# Patient Record
Sex: Male | Born: 1950 | Race: White | Hispanic: No | Marital: Married | Smoking: Former smoker
Health system: Southern US, Community
[De-identification: ages and names within clinical notes are randomized; demographics above are authoritative.]

## PROBLEM LIST (undated history)

## (undated) DIAGNOSIS — D709 Neutropenia, unspecified: Secondary | ICD-10-CM

## (undated) DIAGNOSIS — N183 Chronic kidney disease, stage 3 (moderate): Secondary | ICD-10-CM

## (undated) DIAGNOSIS — I480 Paroxysmal atrial fibrillation: Secondary | ICD-10-CM

## (undated) DIAGNOSIS — C9000 Multiple myeloma not having achieved remission: Secondary | ICD-10-CM

## (undated) DIAGNOSIS — I10 Essential (primary) hypertension: Secondary | ICD-10-CM

## (undated) DIAGNOSIS — G64 Other disorders of peripheral nervous system: Secondary | ICD-10-CM

## (undated) DIAGNOSIS — I5021 Acute systolic (congestive) heart failure: Secondary | ICD-10-CM

## (undated) DIAGNOSIS — I248 Other forms of acute ischemic heart disease: Secondary | ICD-10-CM

## (undated) DIAGNOSIS — B3781 Candidal esophagitis: Secondary | ICD-10-CM

## (undated) DIAGNOSIS — K529 Noninfective gastroenteritis and colitis, unspecified: Secondary | ICD-10-CM

## (undated) DIAGNOSIS — B449 Aspergillosis, unspecified: Secondary | ICD-10-CM

## (undated) DIAGNOSIS — B370 Candidal stomatitis: Secondary | ICD-10-CM

## (undated) DIAGNOSIS — E43 Unspecified severe protein-calorie malnutrition: Secondary | ICD-10-CM

## (undated) DIAGNOSIS — R7881 Bacteremia: Secondary | ICD-10-CM

## (undated) DIAGNOSIS — R5081 Fever presenting with conditions classified elsewhere: Secondary | ICD-10-CM

## (undated) HISTORY — PX: CARDIAC CATHETERIZATION: SHX172

## (undated) HISTORY — PX: PORTACATH PLACEMENT: SHX2246

---

## 2012-07-23 DIAGNOSIS — B4481 Allergic bronchopulmonary aspergillosis: Secondary | ICD-10-CM | POA: Insufficient documentation

## 2012-07-23 DIAGNOSIS — I10 Essential (primary) hypertension: Secondary | ICD-10-CM | POA: Insufficient documentation

## 2012-07-23 DIAGNOSIS — I509 Heart failure, unspecified: Secondary | ICD-10-CM | POA: Insufficient documentation

## 2012-07-23 DIAGNOSIS — G64 Other disorders of peripheral nervous system: Secondary | ICD-10-CM | POA: Insufficient documentation

## 2012-07-23 DIAGNOSIS — C9000 Multiple myeloma not having achieved remission: Secondary | ICD-10-CM

## 2012-07-23 HISTORY — DX: Other disorders of peripheral nervous system: G64

## 2012-07-23 HISTORY — DX: Multiple myeloma not having achieved remission: C90.00

## 2012-08-01 ENCOUNTER — Telehealth: Payer: Self-pay | Admitting: Oncology

## 2012-08-01 NOTE — Telephone Encounter (Signed)
C/D 08/01/12 for appt. 08/07/12

## 2012-08-07 ENCOUNTER — Ambulatory Visit (HOSPITAL_BASED_OUTPATIENT_CLINIC_OR_DEPARTMENT_OTHER): Payer: BC Managed Care – PPO | Admitting: Oncology

## 2012-08-07 ENCOUNTER — Ambulatory Visit (HOSPITAL_BASED_OUTPATIENT_CLINIC_OR_DEPARTMENT_OTHER): Payer: BC Managed Care – PPO

## 2012-08-07 ENCOUNTER — Encounter: Payer: Self-pay | Admitting: Oncology

## 2012-08-07 ENCOUNTER — Telehealth: Payer: Self-pay | Admitting: Oncology

## 2012-08-07 VITALS — BP 122/74 | HR 71 | Temp 97.8°F | Resp 18 | Ht 72.0 in | Wt 211.0 lb

## 2012-08-07 DIAGNOSIS — C9000 Multiple myeloma not having achieved remission: Secondary | ICD-10-CM

## 2012-08-07 NOTE — Progress Notes (Signed)
Checked in new patient. No financial issues. °

## 2012-08-07 NOTE — Telephone Encounter (Signed)
Pt is aware that I will call him with the appt for June. Sent michelle a staff message to add the chemo appts. Pt is aware to pick up a schedule at his first June appt

## 2012-08-08 ENCOUNTER — Telehealth: Payer: Self-pay | Admitting: *Deleted

## 2012-08-08 MED ORDER — ASPIRIN 325 MG PO TABS: 325.0000 mg | ORAL_TABLET | Freq: Every day | ORAL | Status: DC

## 2012-08-08 NOTE — Progress Notes (Signed)
[facility] New Patient Consult   Referring ZO:[id number] Walter Dennis 62 y.o.  December 19, 1950    Reason for Referral: Multiple myeloma     HPI: He reports originally been diagnosed with multiple myeloma while living in [place]. He was treated with lenalidomide and Decadron and developed neuropathy. Treatment was switched to IV Velcade and Decadron followed by an autologous stem cell transplant in 2009 at [facility]. He remained in remission until 2012 when there was a rise in the light chains and serum M spike. He was started on treatment with the VCD. 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 He was living in [place] at the time. He remained off of therapy until January 2014 when the serum M spike Rose. He was started on treatment with Carfilzomib, low-dose Revlimid, and dose reduced Decadron. This continued until April of 2014. He has been maintained off of therapy for the past month while relocating to [place].  He saw Dr. Leeanne Deed at [facility] on 07/25/2012. Dr. Leeanne Deed recommends continuing the current systemic therapy regimen. Walter Dennis has relocated to [facility]. He currently feels well. He has chronic low leg/foot pain secondary to peripheral neuropathy. The neuropathy is felt to be secondary to Velcade therapy.  Past medical history: 1. Multiple myeloma diagnosed in 2008  2. 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 Hypertension  4. Irritable bowel syndrome  5. 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 A maternal aunt died of lung cancer. A maternal aunt died of "brain" cancer.  Current outpatient prescriptions:acyclovir (ZOVIRAX) 400 MG tablet, Take 400 mg by mouth every 12 (twelve) hours. Take 1 tablet by mouth every 12 hours to prevent shingles while on chemo, Disp: ,  Rfl: ;  carvedilol (COREG) 6.25 MG tablet, Take 6.25 mg by mouth every morning., Disp: , Rfl: ;  esomeprazole (NEXIUM) 40 MG capsule, Take 40 mg by mouth daily before breakfast., Disp: , Rfl:  fentaNYL (DURAGESIC - DOSED MCG/HR) 50 MCG/HR, Place 1 patch onto the skin every 3 (three) days., Disp: , Rfl: ;  furosemide (LASIX) 20 MG tablet, Take 40 mg by mouth daily., Disp: , Rfl: ;  HYDROcodone-acetaminophen (NORCO) 10-325 MG per tablet, Take 1 tablet by mouth every 6 (six) hours as needed for pain., Disp: , Rfl: ;  potassium chloride (K-DUR) 10 MEQ tablet, Take 10 mEq by mouth 2 (two) times daily., Disp: , Rfl:  pregabalin (LYRICA) 50 MG capsule, Take 50 mg by mouth 2 (two) times daily., Disp: , Rfl: ;  valsartan (DIOVAN) 80 MG tablet, Take 80 mg by mouth daily., Disp: , Rfl:   Allergies: Not on File  Social History: He works in Manufacturing engineer. He recently relocated to [place]. He quit smoking cigarettes in 2008. He drinks alcohol on social occasions. No risk factor for HIV or hepatitis. No transfusion history.   ROS:   Positives include: Night sweats one to 2 times per week, loose stools, pain/numbness in the lower legs and feet for the past 3 years, left greater than right  A complete ROS was otherwise negative.  Physical Exam:  Blood pressure 122/74, pulse 71, temperature 97.8 F (36.6 C), temperature source Oral, resp. rate 18, height 6' (1.829 m), weight 211 lb (95.709 kg).  HEENT: Oropharynx without visible mass, neck without mass, no thrush Lungs: Clear bilaterally Cardiac: Regular rate and rhythm  Abdomen: No hepatosplenomegaly  Vascular: No leg edema Lymph nodes: No cervical, supraclavicular, axillary, or inguinal nodes Neurologic: Alert and oriented, the motor exam appears intact in the upper and lower extremities Skin: No rash Musculoskeletal: No spine tenderness Right upper chest Port-A-Cath site without erythema  LAB: 07/24/2012 at [facility]-hemoglobin 13.7, platelets  218,000, white count 6.5, ANC 4.0, serum M spike 0.58, beta-2 microglobulin 2.3, free kappa light chain 0.21, free lambda light chain 18.1, IgA 132, IgM 19, IgG 1570, monoclonal IgG lambda detected    Radiology:  Metastatic bone survey 07/26/2012 at [facility]-no definite lytic lesions in the skeleton, patchy sclerosis over the right femoral head,? Avascular necrosis  Assessment/Plan:   1. Multiple myeloma, IgG lambda monoclonal protein -Initial diagnosis 2008, bone marrow with a 40-50% plasma cells -Lenalidomide 25 mg,day 1-21 and Decadron 40 mg weekly and 2009, complicated by peripheral neuropathy -Therapy change to bortezomib IV twice weekly plus Decadron -High-dose chemotherapy with autologous stem cell transplantation 2009 at [facility] -Bortezomib, cyclophosphamide, and Decadron December 2012 through February 2014 (discontinued secondary to pulmonary aspergillosis -January 2014 M spike rise to 1.1 g/dL from 0.1 g/dL in December 7846. Treatment started with Carlfilzomib, lenalidomide, and Decadron.  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 Irritable bowel syndrome  5. Hypertension   Disposition:   Walter Dennis has multiple myeloma. He saw Dr. Leeanne Deed on 07/25/2012. The plan is to continue the current systemic regimen. He has relocated to [place]. The plan is to resume Carfilzomib, lenalidomide, and Decadron therapy on 08/16/2012. We will obtain a baseline serum M spike and serum free light chain level when he returns next week.  He has been referred to the infectious disease service to discuss the indication for prophylactic therapy to prevent recurrent aspergillosis and whether he can receive full dose steroids in the future. He will begin acyclovir and aspirin prophylaxis.  I will see him after the first cycle of treatment. I will request records from his oncologist in [place].   Kirkland Figg 08/08/2012, 7:08 AM

## 2012-08-08 NOTE — Telephone Encounter (Signed)
Per staff message and POF I have scheduled appts.  JMW  

## 2012-08-08 NOTE — Telephone Encounter (Signed)
Per Dr. Truett Perna, called and instructed pt to start ASA 325 mg daily.  Pt verbalized understanding and stated he called the Cancer [facility] in [place] and they will fax records.

## 2012-08-15 ENCOUNTER — Ambulatory Visit: Payer: BC Managed Care – PPO

## 2012-08-15 ENCOUNTER — Telehealth: Payer: Self-pay | Admitting: *Deleted

## 2012-08-15 ENCOUNTER — Other Ambulatory Visit: Payer: Self-pay | Admitting: Oncology

## 2012-08-15 ENCOUNTER — Other Ambulatory Visit: Payer: Self-pay | Admitting: *Deleted

## 2012-08-15 DIAGNOSIS — C9000 Multiple myeloma not having achieved remission: Secondary | ICD-10-CM

## 2012-08-15 HISTORY — DX: Multiple myeloma not having achieved remission: C90.00

## 2012-08-15 MED ORDER — LENALIDOMIDE 10 MG PO CAPS: 10.0000 mg | ORAL_CAPSULE | Freq: Every day | ORAL | Status: DC

## 2012-08-15 MED ORDER — DEXAMETHASONE 4 MG PO TABS: 20.0000 mg | ORAL_TABLET | ORAL | Status: DC

## 2012-08-15 NOTE — Telephone Encounter (Signed)
Called pt, reviewed Revlimid instructions. He voiced understanding. Decadron instructions reviewed as well. Pt uses [facility] mail order pharmacy for Revlimid.

## 2012-08-16 ENCOUNTER — Other Ambulatory Visit: Payer: Self-pay | Admitting: Oncology

## 2012-08-16 ENCOUNTER — Other Ambulatory Visit (HOSPITAL_BASED_OUTPATIENT_CLINIC_OR_DEPARTMENT_OTHER): Payer: BC Managed Care – PPO | Admitting: Lab

## 2012-08-16 ENCOUNTER — Ambulatory Visit (HOSPITAL_BASED_OUTPATIENT_CLINIC_OR_DEPARTMENT_OTHER): Payer: BC Managed Care – PPO

## 2012-08-16 ENCOUNTER — Telehealth: Payer: Self-pay | Admitting: *Deleted

## 2012-08-16 ENCOUNTER — Telehealth: Payer: Self-pay | Admitting: Oncology

## 2012-08-16 ENCOUNTER — Encounter: Payer: Self-pay | Admitting: Oncology

## 2012-08-16 VITALS — BP 111/62 | HR 68 | Temp 98.4°F | Resp 20

## 2012-08-16 DIAGNOSIS — Z5112 Encounter for antineoplastic immunotherapy: Secondary | ICD-10-CM

## 2012-08-16 DIAGNOSIS — C9002 Multiple myeloma in relapse: Secondary | ICD-10-CM

## 2012-08-16 DIAGNOSIS — C9000 Multiple myeloma not having achieved remission: Secondary | ICD-10-CM

## 2012-08-16 LAB — CBC WITH DIFFERENTIAL/PLATELET
BASO%: 1.2 % (ref 0.0–2.0)
Basophils Absolute: 0.1 10*3/uL (ref 0.0–0.1)
HCT: 36.4 % — ABNORMAL LOW (ref 38.4–49.9)
HGB: 12.6 g/dL — ABNORMAL LOW (ref 13.0–17.1)
MONO#: 0.5 10*3/uL (ref 0.1–0.9)
NEUT%: 58.2 % (ref 39.0–75.0)
RDW: 14.4 % (ref 11.0–14.6)
WBC: 6.2 10*3/uL (ref 4.0–10.3)
lymph#: 1.7 10*3/uL (ref 0.9–3.3)

## 2012-08-16 LAB — COMPREHENSIVE METABOLIC PANEL
ALT: 17 U/L (ref 0–53)
Albumin: 3.7 g/dL (ref 3.5–5.2)
BUN: 18 mg/dL (ref 6–23)
CO2: 28 mEq/L (ref 19–32)
Calcium: 9.3 mg/dL (ref 8.4–10.5)
Chloride: 99 mEq/L (ref 96–112)
Creatinine, Ser: 1.3 mg/dL (ref 0.50–1.35)
Potassium: 4 mEq/L (ref 3.5–5.3)

## 2012-08-16 MED ORDER — SODIUM CHLORIDE 0.9 % IJ SOLN
10.0000 mL | INTRAMUSCULAR | Status: DC | PRN
  Administered 2012-08-16: 10 mL
  Filled 2012-08-16: qty 10

## 2012-08-16 MED ORDER — SODIUM CHLORIDE 0.9 % IV SOLN
Freq: Once | INTRAVENOUS | Status: AC
  Administered 2012-08-16: 17:00:00 via INTRAVENOUS

## 2012-08-16 MED ORDER — HEPARIN SOD (PORK) LOCK FLUSH 100 UNIT/ML IV SOLN
500.0000 [IU] | Freq: Once | INTRAVENOUS | Status: AC | PRN
  Administered 2012-08-16: 500 [IU]
  Filled 2012-08-16: qty 5

## 2012-08-16 MED ORDER — ONDANSETRON 8 MG/50ML IVPB (CHCC)
8.0000 mg | Freq: Once | INTRAVENOUS | Status: AC
  Administered 2012-08-16: 8 mg via INTRAVENOUS

## 2012-08-16 MED ORDER — SODIUM CHLORIDE 0.9 % IV SOLN
Freq: Once | INTRAVENOUS | Status: AC
  Administered 2012-08-16: 16:00:00 via INTRAVENOUS

## 2012-08-16 MED ORDER — DEXTROSE 5 % IV SOLN
27.0000 mg/m2 | Freq: Once | INTRAVENOUS | Status: AC
  Administered 2012-08-16: 60 mg via INTRAVENOUS
  Filled 2012-08-16: qty 30

## 2012-08-16 NOTE — Telephone Encounter (Signed)
TAlked to pt and gave him appt for chemo on 08/17/12

## 2012-08-16 NOTE — Progress Notes (Signed)
Faxed revlimid prescription to [facility] @ [phone].

## 2012-08-16 NOTE — Patient Instructions (Addendum)
[facility] Discharge Instructions for Patients Receiving Chemotherapy  Today you received the following chemotherapy agents Kyprolis.  To help prevent nausea and vomiting after your treatment, we encourage you to take your nausea medication.   If you develop nausea and vomiting that is not controlled by your nausea medication, call the clinic.   BELOW ARE SYMPTOMS THAT SHOULD BE REPORTED IMMEDIATELY:  *FEVER GREATER THAN 100.5 F  *CHILLS WITH OR WITHOUT FEVER  NAUSEA AND VOMITING THAT IS NOT CONTROLLED WITH YOUR NAUSEA MEDICATION  *UNUSUAL SHORTNESS OF BREATH  *UNUSUAL BRUISING OR BLEEDING  TENDERNESS IN MOUTH AND THROAT WITH OR WITHOUT PRESENCE OF ULCERS  *URINARY PROBLEMS  *BOWEL PROBLEMS  UNUSUAL RASH Items with * indicate a potential emergency and should be followed up as soon as possible.  Feel free to call the clinic you have any questions or concerns. The clinic phone number is [phone].    

## 2012-08-17 ENCOUNTER — Ambulatory Visit (HOSPITAL_BASED_OUTPATIENT_CLINIC_OR_DEPARTMENT_OTHER): Payer: BC Managed Care – PPO

## 2012-08-17 VITALS — BP 140/76 | HR 109 | Temp 97.1°F

## 2012-08-17 DIAGNOSIS — C9000 Multiple myeloma not having achieved remission: Secondary | ICD-10-CM

## 2012-08-17 DIAGNOSIS — Z5112 Encounter for antineoplastic immunotherapy: Secondary | ICD-10-CM

## 2012-08-17 DIAGNOSIS — C9002 Multiple myeloma in relapse: Secondary | ICD-10-CM

## 2012-08-17 MED ORDER — SODIUM CHLORIDE 0.9 % IJ SOLN
10.0000 mL | INTRAMUSCULAR | Status: DC | PRN
  Administered 2012-08-17: 10 mL
  Filled 2012-08-17: qty 10

## 2012-08-17 MED ORDER — SODIUM CHLORIDE 0.9 % IV SOLN
Freq: Once | INTRAVENOUS | Status: AC
  Administered 2012-08-17: 16:00:00 via INTRAVENOUS

## 2012-08-17 MED ORDER — DEXTROSE 5 % IV SOLN
27.0000 mg/m2 | Freq: Once | INTRAVENOUS | Status: AC
  Administered 2012-08-17: 60 mg via INTRAVENOUS
  Filled 2012-08-17: qty 30

## 2012-08-17 MED ORDER — HEPARIN SOD (PORK) LOCK FLUSH 100 UNIT/ML IV SOLN
500.0000 [IU] | Freq: Once | INTRAVENOUS | Status: AC | PRN
  Administered 2012-08-17: 500 [IU]
  Filled 2012-08-17: qty 5

## 2012-08-17 MED ORDER — ONDANSETRON 8 MG/50ML IVPB (CHCC)
8.0000 mg | Freq: Once | INTRAVENOUS | Status: AC
  Administered 2012-08-17: 8 mg via INTRAVENOUS

## 2012-08-17 NOTE — Patient Instructions (Signed)
[facility] Discharge Instructions for Patients Receiving Chemotherapy  Today you received the following chemotherapy agents kyprolis  To help prevent nausea and vomiting after your treatment, we encourage you to take your nausea medication as needed   If you develop nausea and vomiting that is not controlled by your nausea medication, call the clinic.   BELOW ARE SYMPTOMS THAT SHOULD BE REPORTED IMMEDIATELY:  *FEVER GREATER THAN 100.5 F  *CHILLS WITH OR WITHOUT FEVER  NAUSEA AND VOMITING THAT IS NOT CONTROLLED WITH YOUR NAUSEA MEDICATION  *UNUSUAL SHORTNESS OF BREATH  *UNUSUAL BRUISING OR BLEEDING  TENDERNESS IN MOUTH AND THROAT WITH OR WITHOUT PRESENCE OF ULCERS  *URINARY PROBLEMS  *BOWEL PROBLEMS  UNUSUAL RASH Items with * indicate a potential emergency and should be followed up as soon as possible.  Feel free to call the clinic you have any questions or concerns. The clinic phone number is [phone].    

## 2012-08-20 LAB — PROTEIN ELECTROPHORESIS, SERUM
Albumin ELP: 54.6 % — ABNORMAL LOW (ref 55.8–66.1)
Alpha-1-Globulin: 3.7 % (ref 2.9–4.9)
Alpha-2-Globulin: 7.9 % (ref 7.1–11.8)
Beta 2: 25 % — ABNORMAL HIGH (ref 3.2–6.5)
Beta Globulin: 5.8 % (ref 4.7–7.2)

## 2012-08-20 LAB — IGG, IGA, IGM: IgM, Serum: 17 mg/dL — ABNORMAL LOW (ref 41–251)

## 2012-08-23 ENCOUNTER — Other Ambulatory Visit: Payer: Self-pay | Admitting: *Deleted

## 2012-08-23 ENCOUNTER — Other Ambulatory Visit (HOSPITAL_BASED_OUTPATIENT_CLINIC_OR_DEPARTMENT_OTHER): Payer: BC Managed Care – PPO

## 2012-08-23 ENCOUNTER — Ambulatory Visit (HOSPITAL_BASED_OUTPATIENT_CLINIC_OR_DEPARTMENT_OTHER): Payer: BC Managed Care – PPO

## 2012-08-23 VITALS — BP 137/70 | HR 94 | Temp 98.1°F

## 2012-08-23 DIAGNOSIS — C9002 Multiple myeloma in relapse: Secondary | ICD-10-CM

## 2012-08-23 DIAGNOSIS — Z5112 Encounter for antineoplastic immunotherapy: Secondary | ICD-10-CM

## 2012-08-23 DIAGNOSIS — C9000 Multiple myeloma not having achieved remission: Secondary | ICD-10-CM

## 2012-08-23 LAB — CBC WITH DIFFERENTIAL/PLATELET
Basophils Absolute: 0 10*3/uL (ref 0.0–0.1)
EOS%: 0.1 % (ref 0.0–7.0)
Eosinophils Absolute: 0 10*3/uL (ref 0.0–0.5)
HGB: 12.8 g/dL — ABNORMAL LOW (ref 13.0–17.1)
MONO#: 0.1 10*3/uL (ref 0.1–0.9)
NEUT#: 8.8 10*3/uL — ABNORMAL HIGH (ref 1.5–6.5)
RDW: 14.7 % — ABNORMAL HIGH (ref 11.0–14.6)
WBC: 9.7 10*3/uL (ref 4.0–10.3)
lymph#: 0.8 10*3/uL — ABNORMAL LOW (ref 0.9–3.3)

## 2012-08-23 MED ORDER — ONDANSETRON 8 MG/50ML IVPB (CHCC)
8.0000 mg | Freq: Once | INTRAVENOUS | Status: AC
  Administered 2012-08-23: 8 mg via INTRAVENOUS

## 2012-08-23 MED ORDER — SODIUM CHLORIDE 0.9 % IV SOLN
Freq: Once | INTRAVENOUS | Status: AC
  Administered 2012-08-23: 16:00:00 via INTRAVENOUS

## 2012-08-23 MED ORDER — HEPARIN SOD (PORK) LOCK FLUSH 100 UNIT/ML IV SOLN
500.0000 [IU] | Freq: Once | INTRAVENOUS | Status: AC | PRN
  Filled 2012-08-23: qty 5

## 2012-08-23 MED ORDER — FENTANYL 50 MCG/HR TD PT72: 1.0000 | MEDICATED_PATCH | TRANSDERMAL | Status: DC

## 2012-08-23 MED ORDER — DEXTROSE 5 % IV SOLN
27.0000 mg/m2 | Freq: Once | INTRAVENOUS | Status: AC
  Administered 2012-08-23: 60 mg via INTRAVENOUS
  Filled 2012-08-23: qty 30

## 2012-08-23 MED ORDER — VALSARTAN 80 MG PO TABS: 80.0000 mg | ORAL_TABLET | Freq: Every day | ORAL | Status: DC

## 2012-08-23 MED ORDER — SODIUM CHLORIDE 0.9 % IJ SOLN
10.0000 mL | INTRAMUSCULAR | Status: DC | PRN
  Filled 2012-08-23: qty 10

## 2012-08-23 NOTE — Patient Instructions (Addendum)
[facility] Discharge Instructions for Patients Receiving Chemotherapy  Today you received the following chemotherapy agents Kyprolis,  To help prevent nausea and vomiting after your treatment, we encourage you to take your nausea medication as prescribed.   If you develop nausea and vomiting that is not controlled by your nausea medication, call the clinic.   BELOW ARE SYMPTOMS THAT SHOULD BE REPORTED IMMEDIATELY:  *FEVER GREATER THAN 100.5 F  *CHILLS WITH OR WITHOUT FEVER  NAUSEA AND VOMITING THAT IS NOT CONTROLLED WITH YOUR NAUSEA MEDICATION  *UNUSUAL SHORTNESS OF BREATH  *UNUSUAL BRUISING OR BLEEDING  TENDERNESS IN MOUTH AND THROAT WITH OR WITHOUT PRESENCE OF ULCERS  *URINARY PROBLEMS  *BOWEL PROBLEMS  UNUSUAL RASH Items with * indicate a potential emergency and should be followed up as soon as possible.  Feel free to call the clinic you have any questions or concerns. The clinic phone number is [phone].

## 2012-08-23 NOTE — Telephone Encounter (Signed)
Patient requesting refill on Duragesic patches. Has #2 left-uses one patch every 3 days.

## 2012-08-24 ENCOUNTER — Ambulatory Visit (HOSPITAL_BASED_OUTPATIENT_CLINIC_OR_DEPARTMENT_OTHER): Payer: BC Managed Care – PPO

## 2012-08-24 VITALS — BP 115/67 | HR 62 | Temp 98.3°F

## 2012-08-24 DIAGNOSIS — C9002 Multiple myeloma in relapse: Secondary | ICD-10-CM

## 2012-08-24 DIAGNOSIS — Z5112 Encounter for antineoplastic immunotherapy: Secondary | ICD-10-CM

## 2012-08-24 DIAGNOSIS — C9000 Multiple myeloma not having achieved remission: Secondary | ICD-10-CM

## 2012-08-24 MED ORDER — SODIUM CHLORIDE 0.9 % IJ SOLN
10.0000 mL | INTRAMUSCULAR | Status: DC | PRN
  Administered 2012-08-24: 10 mL
  Filled 2012-08-24: qty 10

## 2012-08-24 MED ORDER — DEXTROSE 5 % IV SOLN
27.0000 mg/m2 | Freq: Once | INTRAVENOUS | Status: AC
  Administered 2012-08-24: 60 mg via INTRAVENOUS
  Filled 2012-08-24: qty 30

## 2012-08-24 MED ORDER — SODIUM CHLORIDE 0.9 % IV SOLN
Freq: Once | INTRAVENOUS | Status: AC
  Administered 2012-08-24: 16:00:00 via INTRAVENOUS

## 2012-08-24 MED ORDER — ONDANSETRON 8 MG/50ML IVPB (CHCC)
8.0000 mg | Freq: Once | INTRAVENOUS | Status: AC
  Administered 2012-08-24: 8 mg via INTRAVENOUS

## 2012-08-24 MED ORDER — HEPARIN SOD (PORK) LOCK FLUSH 100 UNIT/ML IV SOLN
500.0000 [IU] | Freq: Once | INTRAVENOUS | Status: AC | PRN
  Administered 2012-08-24: 500 [IU]
  Filled 2012-08-24: qty 5

## 2012-08-24 NOTE — Patient Instructions (Signed)
[facility] Discharge Instructions for Patients Receiving Chemotherapy  Today you received the following chemotherapy agents Kyprolis To help prevent nausea and vomiting after your treatment, we encourage you to take your nausea medication as prescribed.  If you develop nausea and vomiting that is not controlled by your nausea medication, call the clinic.   BELOW ARE SYMPTOMS THAT SHOULD BE REPORTED IMMEDIATELY:  *FEVER GREATER THAN 100.5 F  *CHILLS WITH OR WITHOUT FEVER  NAUSEA AND VOMITING THAT IS NOT CONTROLLED WITH YOUR NAUSEA MEDICATION  *UNUSUAL SHORTNESS OF BREATH  *UNUSUAL BRUISING OR BLEEDING  TENDERNESS IN MOUTH AND THROAT WITH OR WITHOUT PRESENCE OF ULCERS  *URINARY PROBLEMS  *BOWEL PROBLEMS  UNUSUAL RASH Items with * indicate a potential emergency and should be followed up as soon as possible.  Feel free to call the clinic you have any questions or concerns. The clinic phone number is [phone].    

## 2012-08-28 ENCOUNTER — Telehealth: Payer: Self-pay | Admitting: Oncology

## 2012-08-28 NOTE — Telephone Encounter (Signed)
talked to pt he is aware of appt on 6/19 lab and chemo

## 2012-08-30 ENCOUNTER — Other Ambulatory Visit (HOSPITAL_BASED_OUTPATIENT_CLINIC_OR_DEPARTMENT_OTHER): Payer: BC Managed Care – PPO | Admitting: Lab

## 2012-08-30 ENCOUNTER — Ambulatory Visit (HOSPITAL_BASED_OUTPATIENT_CLINIC_OR_DEPARTMENT_OTHER): Payer: BC Managed Care – PPO

## 2012-08-30 VITALS — BP 110/85 | HR 92 | Temp 99.2°F | Resp 20

## 2012-08-30 DIAGNOSIS — C9000 Multiple myeloma not having achieved remission: Secondary | ICD-10-CM

## 2012-08-30 DIAGNOSIS — C9002 Multiple myeloma in relapse: Secondary | ICD-10-CM

## 2012-08-30 DIAGNOSIS — Z5112 Encounter for antineoplastic immunotherapy: Secondary | ICD-10-CM

## 2012-08-30 LAB — CBC WITH DIFFERENTIAL/PLATELET
BASO%: 0.6 % (ref 0.0–2.0)
Basophils Absolute: 0.1 10*3/uL (ref 0.0–0.1)
HCT: 40.4 % (ref 38.4–49.9)
HGB: 13.9 g/dL (ref 13.0–17.1)
MONO#: 0.1 10*3/uL (ref 0.1–0.9)
NEUT%: 91.7 % — ABNORMAL HIGH (ref 39.0–75.0)
WBC: 10.5 10*3/uL — ABNORMAL HIGH (ref 4.0–10.3)
lymph#: 0.7 10*3/uL — ABNORMAL LOW (ref 0.9–3.3)

## 2012-08-30 MED ORDER — SODIUM CHLORIDE 0.9 % IV SOLN: Freq: Once | INTRAVENOUS | Status: DC

## 2012-08-30 MED ORDER — SODIUM CHLORIDE 0.9 % IJ SOLN
10.0000 mL | INTRAMUSCULAR | Status: DC | PRN
  Administered 2012-08-30: 10 mL
  Filled 2012-08-30: qty 10

## 2012-08-30 MED ORDER — SODIUM CHLORIDE 0.9 % IV SOLN
Freq: Once | INTRAVENOUS | Status: AC
  Administered 2012-08-30: 15:00:00 via INTRAVENOUS

## 2012-08-30 MED ORDER — ONDANSETRON 8 MG/50ML IVPB (CHCC)
8.0000 mg | Freq: Once | INTRAVENOUS | Status: AC
  Administered 2012-08-30: 8 mg via INTRAVENOUS

## 2012-08-30 MED ORDER — HEPARIN SOD (PORK) LOCK FLUSH 100 UNIT/ML IV SOLN
500.0000 [IU] | Freq: Once | INTRAVENOUS | Status: AC | PRN
  Administered 2012-08-30: 500 [IU]
  Filled 2012-08-30: qty 5

## 2012-08-30 MED ORDER — DEXTROSE 5 % IV SOLN
27.0000 mg/m2 | Freq: Once | INTRAVENOUS | Status: AC
  Administered 2012-08-30: 60 mg via INTRAVENOUS
  Filled 2012-08-30: qty 30

## 2012-08-30 NOTE — Patient Instructions (Addendum)
[facility] Discharge Instructions for Patients Receiving Chemotherapy  Today you received the following chemotherapy agents Kyprolis  To help prevent nausea and vomiting after your treatment, we encourage you to take your nausea medication as needed   If you develop nausea and vomiting that is not controlled by your nausea medication, call the clinic.   BELOW ARE SYMPTOMS THAT SHOULD BE REPORTED IMMEDIATELY:  *FEVER GREATER THAN 100.5 F  *CHILLS WITH OR WITHOUT FEVER  NAUSEA AND VOMITING THAT IS NOT CONTROLLED WITH YOUR NAUSEA MEDICATION  *UNUSUAL SHORTNESS OF BREATH  *UNUSUAL BRUISING OR BLEEDING  TENDERNESS IN MOUTH AND THROAT WITH OR WITHOUT PRESENCE OF ULCERS  *URINARY PROBLEMS  *BOWEL PROBLEMS  UNUSUAL RASH Items with * indicate a potential emergency and should be followed up as soon as possible.  Feel free to call the clinic you have any questions or concerns. The clinic phone number is [phone].    

## 2012-08-31 ENCOUNTER — Ambulatory Visit (HOSPITAL_BASED_OUTPATIENT_CLINIC_OR_DEPARTMENT_OTHER): Payer: BC Managed Care – PPO

## 2012-08-31 VITALS — BP 114/51 | HR 70 | Temp 97.9°F | Resp 20

## 2012-08-31 DIAGNOSIS — C9000 Multiple myeloma not having achieved remission: Secondary | ICD-10-CM

## 2012-08-31 DIAGNOSIS — C9002 Multiple myeloma in relapse: Secondary | ICD-10-CM

## 2012-08-31 DIAGNOSIS — Z5112 Encounter for antineoplastic immunotherapy: Secondary | ICD-10-CM

## 2012-08-31 MED ORDER — DEXTROSE 5 % IV SOLN
27.0000 mg/m2 | Freq: Once | INTRAVENOUS | Status: AC
  Administered 2012-08-31: 60 mg via INTRAVENOUS
  Filled 2012-08-31: qty 30

## 2012-08-31 MED ORDER — SODIUM CHLORIDE 0.9 % IV SOLN
Freq: Once | INTRAVENOUS | Status: AC
  Administered 2012-08-31: 16:00:00 via INTRAVENOUS

## 2012-08-31 MED ORDER — ONDANSETRON 8 MG/50ML IVPB (CHCC)
8.0000 mg | Freq: Once | INTRAVENOUS | Status: AC
  Administered 2012-08-31: 8 mg via INTRAVENOUS

## 2012-08-31 MED ORDER — SODIUM CHLORIDE 0.9 % IJ SOLN
10.0000 mL | INTRAMUSCULAR | Status: DC | PRN
  Administered 2012-08-31: 10 mL
  Filled 2012-08-31: qty 10

## 2012-08-31 MED ORDER — HEPARIN SOD (PORK) LOCK FLUSH 100 UNIT/ML IV SOLN
500.0000 [IU] | Freq: Once | INTRAVENOUS | Status: AC | PRN
  Administered 2012-08-31: 500 [IU]
  Filled 2012-08-31: qty 5

## 2012-08-31 MED ORDER — SODIUM CHLORIDE 0.9 % IV SOLN
Freq: Once | INTRAVENOUS | Status: AC
  Administered 2012-08-31: 15:00:00 via INTRAVENOUS

## 2012-08-31 NOTE — Patient Instructions (Addendum)
[facility] Discharge Instructions for Patients Receiving Chemotherapy  Today you received the following chemotherapy agents Kyprolis To help prevent nausea and vomiting after your treatment, we encourage you to take your nausea medication as prescribed.  If you develop nausea and vomiting that is not controlled by your nausea medication, call the clinic.   BELOW ARE SYMPTOMS THAT SHOULD BE REPORTED IMMEDIATELY:  *FEVER GREATER THAN 100.5 F  *CHILLS WITH OR WITHOUT FEVER  NAUSEA AND VOMITING THAT IS NOT CONTROLLED WITH YOUR NAUSEA MEDICATION  *UNUSUAL SHORTNESS OF BREATH  *UNUSUAL BRUISING OR BLEEDING  TENDERNESS IN MOUTH AND THROAT WITH OR WITHOUT PRESENCE OF ULCERS  *URINARY PROBLEMS  *BOWEL PROBLEMS  UNUSUAL RASH Items with * indicate a potential emergency and should be followed up as soon as possible.  Feel free to call the clinic you have any questions or concerns. The clinic phone number is [phone].    

## 2012-09-09 ENCOUNTER — Other Ambulatory Visit: Payer: Self-pay | Admitting: Oncology

## 2012-09-12 ENCOUNTER — Other Ambulatory Visit (HOSPITAL_BASED_OUTPATIENT_CLINIC_OR_DEPARTMENT_OTHER): Payer: BC Managed Care – PPO | Admitting: Lab

## 2012-09-12 ENCOUNTER — Ambulatory Visit (HOSPITAL_BASED_OUTPATIENT_CLINIC_OR_DEPARTMENT_OTHER): Payer: BC Managed Care – PPO | Admitting: Oncology

## 2012-09-12 ENCOUNTER — Other Ambulatory Visit: Payer: BC Managed Care – PPO | Admitting: Lab

## 2012-09-12 ENCOUNTER — Ambulatory Visit (HOSPITAL_BASED_OUTPATIENT_CLINIC_OR_DEPARTMENT_OTHER): Payer: BC Managed Care – PPO

## 2012-09-12 ENCOUNTER — Telehealth: Payer: Self-pay | Admitting: Oncology

## 2012-09-12 ENCOUNTER — Ambulatory Visit: Payer: BC Managed Care – PPO | Admitting: Oncology

## 2012-09-12 ENCOUNTER — Telehealth: Payer: Self-pay | Admitting: *Deleted

## 2012-09-12 ENCOUNTER — Other Ambulatory Visit: Payer: Self-pay | Admitting: Oncology

## 2012-09-12 VITALS — BP 108/68 | HR 74 | Temp 98.2°F | Resp 20 | Ht 72.0 in | Wt 210.2 lb

## 2012-09-12 DIAGNOSIS — Z5112 Encounter for antineoplastic immunotherapy: Secondary | ICD-10-CM

## 2012-09-12 DIAGNOSIS — C9000 Multiple myeloma not having achieved remission: Secondary | ICD-10-CM

## 2012-09-12 DIAGNOSIS — C9002 Multiple myeloma in relapse: Secondary | ICD-10-CM

## 2012-09-12 DIAGNOSIS — I10 Essential (primary) hypertension: Secondary | ICD-10-CM

## 2012-09-12 DIAGNOSIS — G609 Hereditary and idiopathic neuropathy, unspecified: Secondary | ICD-10-CM

## 2012-09-12 LAB — BASIC METABOLIC PANEL (CC13)
BUN: 16.3 mg/dL (ref 7.0–26.0)
CO2: 27 mEq/L (ref 22–29)
Calcium: 9.6 mg/dL (ref 8.4–10.4)
Creatinine: 1.2 mg/dL (ref 0.7–1.3)
Glucose: 99 mg/dl (ref 70–140)
Sodium: 139 mEq/L (ref 136–145)

## 2012-09-12 LAB — CBC WITH DIFFERENTIAL/PLATELET
Eosinophils Absolute: 0.3 10*3/uL (ref 0.0–0.5)
HCT: 37.4 % — ABNORMAL LOW (ref 38.4–49.9)
LYMPH%: 20.8 % (ref 14.0–49.0)
MCV: 86 fL (ref 79.3–98.0)
MONO#: 0.5 10*3/uL (ref 0.1–0.9)
MONO%: 8.4 % (ref 0.0–14.0)
NEUT#: 4.2 10*3/uL (ref 1.5–6.5)
NEUT%: 64.9 % (ref 39.0–75.0)
Platelets: 252 10*3/uL (ref 140–400)
RBC: 4.35 10*6/uL (ref 4.20–5.82)

## 2012-09-12 MED ORDER — HEPARIN SOD (PORK) LOCK FLUSH 100 UNIT/ML IV SOLN
500.0000 [IU] | Freq: Once | INTRAVENOUS | Status: AC | PRN
  Administered 2012-09-12: 500 [IU]
  Filled 2012-09-12: qty 5

## 2012-09-12 MED ORDER — SODIUM CHLORIDE 0.9 % IV SOLN
Freq: Once | INTRAVENOUS | Status: AC
  Administered 2012-09-12: 11:00:00 via INTRAVENOUS

## 2012-09-12 MED ORDER — FENTANYL 25 MCG/HR TD PT72: 1.0000 | MEDICATED_PATCH | TRANSDERMAL | Status: DC

## 2012-09-12 MED ORDER — ONDANSETRON 8 MG/50ML IVPB (CHCC)
8.0000 mg | Freq: Once | INTRAVENOUS | Status: AC
  Administered 2012-09-12: 8 mg via INTRAVENOUS

## 2012-09-12 MED ORDER — SODIUM CHLORIDE 0.9 % IJ SOLN
10.0000 mL | INTRAMUSCULAR | Status: DC | PRN
  Administered 2012-09-12: 10 mL
  Filled 2012-09-12: qty 10

## 2012-09-12 MED ORDER — LENALIDOMIDE 10 MG PO CAPS: 10.0000 mg | ORAL_CAPSULE | Freq: Every day | ORAL | Status: DC

## 2012-09-12 MED ORDER — DEXTROSE 5 % IV SOLN
27.0000 mg/m2 | Freq: Once | INTRAVENOUS | Status: AC
  Administered 2012-09-12: 60 mg via INTRAVENOUS
  Filled 2012-09-12: qty 30

## 2012-09-12 NOTE — Patient Instructions (Addendum)
[facility] Discharge Instructions for Patients Receiving Chemotherapy  Today you received the following chemotherapy agents Kyprolis To help prevent nausea and vomiting after your treatment, we encourage you to take your nausea medication as prescribed.  If you develop nausea and vomiting that is not controlled by your nausea medication, call the clinic.   BELOW ARE SYMPTOMS THAT SHOULD BE REPORTED IMMEDIATELY:  *FEVER GREATER THAN 100.5 F  *CHILLS WITH OR WITHOUT FEVER  NAUSEA AND VOMITING THAT IS NOT CONTROLLED WITH YOUR NAUSEA MEDICATION  *UNUSUAL SHORTNESS OF BREATH  *UNUSUAL BRUISING OR BLEEDING  TENDERNESS IN MOUTH AND THROAT WITH OR WITHOUT PRESENCE OF ULCERS  *URINARY PROBLEMS  *BOWEL PROBLEMS  UNUSUAL RASH Items with * indicate a potential emergency and should be followed up as soon as possible.  Feel free to call the clinic you have any questions or concerns. The clinic phone number is [phone].    

## 2012-09-12 NOTE — Progress Notes (Signed)
   [facility]    OFFICE PROGRESS NOTE   INTERVAL HISTORY:   He returns as scheduled. No new complaint. He has completed one cycle of systemic therapy since relocating to [place]. He is scheduled to begin cycle 2 today. He has no pain. He would like to wean the Duragesic patch. Numbness in the left foot is chronic.  Objective:  Vital signs in last 24 hours:  Blood pressure 108/68, pulse 74, temperature 98.2 F (36.8 C), temperature source Oral, resp. rate 20, height 6' (1.829 m), weight 210 lb 3.2 oz (95.346 kg).    HEENT: no thrush Resp: lungs clear bilaterally Cardio: regular rate and rhythm GI: no hepatosplenomegaly Vascular:  No legedema   Portacath/PICC-without erythema  Lab Results:  Lab Results  Component Value Date   WBC 6.4 09/12/2012   HGB 13.4 09/12/2012   HCT 37.4* 09/12/2012   MCV 86.0 09/12/2012   PLT 252 09/12/2012  ANC 4.2  08/16/2012-serum M spike 1.7, IgG 1730, lambda free light chain 21.7  Medications: I have reviewed the patient's current medications.  Assessment/Plan: 1. Multiple myeloma, IgG lambda monoclonal protein  -Initial diagnosis 2008, bone marrow with a 40-50% plasma cells  -Lenalidomide 25 mg,day 1-21 and Decadron 40 mg weekly and 2009, complicated by peripheral neuropathy  -Therapy change to bortezomib IV twice weekly plus Decadron  -High-dose chemotherapy with autologous stem cell transplantation 2009 at [facility]  -Bortezomib, cyclophosphamide, and Decadron December 2012 through February 2014 (discontinued secondary to pulmonary aspergillosis  -January 2014 M spike rise to 1.1 g/dL from 0.1 g/dL in December 1308. Treatment started with Carlfilzomib, lenalidomide, and Decadron.  -treatment was wound with Carlfilzomib, lenalidomide, and Decadron06/07/2012 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 Irritable bowel syndrome  5. Hypertension    Disposition:  He  appears stable. He appears to be tolerating the chemotherapy well. The plan is to begin another cycle of Carlfilzomib, lenalidomide, and Decadron today. We will obtain a repeat serum protein electrophoresis, IgG level, and serum free light chain analysis after this cycle.  He will return for an office visit 10/18/2012.we decreased the Duragesic to 25 mcg.   Thornton Papas, MD  09/12/2012  3:41 PM

## 2012-09-12 NOTE — Telephone Encounter (Signed)
Gave pt appt fo lab and MD for July and August, emailed Marcelino Duster regarding chemo, printed AVS

## 2012-09-12 NOTE — Telephone Encounter (Signed)
Per staff message and POF I have scheduled appts.  JMW  

## 2012-09-13 ENCOUNTER — Ambulatory Visit: Payer: BC Managed Care – PPO | Admitting: Oncology

## 2012-09-13 ENCOUNTER — Other Ambulatory Visit: Payer: Self-pay

## 2012-09-13 ENCOUNTER — Ambulatory Visit (HOSPITAL_BASED_OUTPATIENT_CLINIC_OR_DEPARTMENT_OTHER): Payer: BC Managed Care – PPO

## 2012-09-13 ENCOUNTER — Other Ambulatory Visit: Payer: BC Managed Care – PPO | Admitting: Lab

## 2012-09-13 ENCOUNTER — Telehealth: Payer: Self-pay | Admitting: Oncology

## 2012-09-13 VITALS — BP 126/74 | HR 101 | Temp 97.0°F | Resp 20

## 2012-09-13 DIAGNOSIS — Z5112 Encounter for antineoplastic immunotherapy: Secondary | ICD-10-CM

## 2012-09-13 DIAGNOSIS — R079 Chest pain, unspecified: Secondary | ICD-10-CM

## 2012-09-13 DIAGNOSIS — R0602 Shortness of breath: Secondary | ICD-10-CM

## 2012-09-13 DIAGNOSIS — C9000 Multiple myeloma not having achieved remission: Secondary | ICD-10-CM

## 2012-09-13 DIAGNOSIS — C9002 Multiple myeloma in relapse: Secondary | ICD-10-CM

## 2012-09-13 MED ORDER — SODIUM CHLORIDE 0.9 % IV SOLN
Freq: Once | INTRAVENOUS | Status: AC
  Administered 2012-09-13: 16:00:00 via INTRAVENOUS

## 2012-09-13 MED ORDER — ONDANSETRON 8 MG/50ML IVPB (CHCC)
8.0000 mg | Freq: Once | INTRAVENOUS | Status: AC
  Administered 2012-09-13: 8 mg via INTRAVENOUS

## 2012-09-13 MED ORDER — DEXTROSE 5 % IV SOLN
27.0000 mg/m2 | Freq: Once | INTRAVENOUS | Status: AC
  Administered 2012-09-13: 60 mg via INTRAVENOUS
  Filled 2012-09-13: qty 30

## 2012-09-13 MED ORDER — ONDANSETRON HCL 8 MG PO TABS: 8.0000 mg | ORAL_TABLET | Freq: Two times a day (BID) | ORAL | Status: DC | PRN

## 2012-09-13 MED ORDER — HEPARIN SOD (PORK) LOCK FLUSH 100 UNIT/ML IV SOLN
500.0000 [IU] | Freq: Once | INTRAVENOUS | Status: AC | PRN
  Administered 2012-09-13: 500 [IU]
  Filled 2012-09-13: qty 5

## 2012-09-13 MED ORDER — SODIUM CHLORIDE 0.9 % IJ SOLN
10.0000 mL | INTRAMUSCULAR | Status: DC | PRN
  Administered 2012-09-13: 10 mL
  Filled 2012-09-13: qty 10

## 2012-09-13 NOTE — Telephone Encounter (Signed)
Talked to pt and gave him appt for today ansd July  and August appt

## 2012-09-13 NOTE — Patient Instructions (Addendum)
[facility] Discharge Instructions for Patients Receiving Chemotherapy  Today you received the following chemotherapy agent: Kyprolis  To help prevent nausea and vomiting after your treatment, we encourage you to take your nausea medication : Zofran 8 mg by mouth twice daily as needed for nausea   If you develop nausea and vomiting that is not controlled by your nausea medication, call the clinic.   BELOW ARE SYMPTOMS THAT SHOULD BE REPORTED IMMEDIATELY:  *FEVER GREATER THAN 100.5 F  *CHILLS WITH OR WITHOUT FEVER  NAUSEA AND VOMITING THAT IS NOT CONTROLLED WITH YOUR NAUSEA MEDICATION  *UNUSUAL SHORTNESS OF BREATH  *UNUSUAL BRUISING OR BLEEDING  TENDERNESS IN MOUTH AND THROAT WITH OR WITHOUT PRESENCE OF ULCERS  *URINARY PROBLEMS  *BOWEL PROBLEMS  UNUSUAL RASH Items with * indicate a potential emergency and should be followed up as soon as possible.  Feel free to call the clinic you have any questions or concerns. The clinic phone number is [phone].  [facility]: primary care or cardiology practice [phone]    Chest Pain, Nonspecific It is often hard to give a specific diagnosis for the cause of chest pain. There is always a chance that your pain could be related to something serious, like a heart attack or a blood clot in the lungs. You need to follow up with your caregiver for further evaluation. More lab tests or other studies such as X-rays, electrocardiography, stress testing, or cardiac imaging may be needed to find the cause of your pain. Most of the time, nonspecific chest pain improves within 2 to 3 days with rest and mild pain medicine. For the next few days, avoid physical exertion or activities that bring on pain. Do not smoke. Avoid drinking alcohol. Call your caregiver for routine follow-up as advised.  SEEK IMMEDIATE MEDICAL CARE IF:  You develop increased chest pain or pain that radiates to the arm, neck, jaw, back, or abdomen.  You  develop shortness of breath, increased coughing, or you start coughing up blood.  You have severe back or abdominal pain, nausea, or vomiting.  You develop severe weakness, fainting, fever, or chills. Document Released: 02/28/2005 Document Revised: 05/23/2011 Document Reviewed: 08/18/2006 [facility] Patient Information 2014 [place], [place].

## 2012-09-13 NOTE — Progress Notes (Signed)
Reports #3 episodes of "pressure-like" chest pain today during periods of exertion. Self resolved when he sat and rested. Reports some mild dyspnea during the episodes. Dr. Truett Perna made aware-EKG and rhythm strip ordered. EKG reviewed by Dr. Truett Perna : Normal- OK to treat today. Strongly encouraged patient to get PCP in area now-suggested [facility] and to go to ER if chest pain happens again. He reports some nausea last night-called in Zofran for him as taken in past with good results.

## 2012-09-19 ENCOUNTER — Telehealth: Payer: Self-pay | Admitting: *Deleted

## 2012-09-19 NOTE — Telephone Encounter (Signed)
Received fax from [employer]. Pt has been approved for Revlimid 09/17/12 through 09/18/2013. Reference # [id number].

## 2012-09-20 ENCOUNTER — Ambulatory Visit (HOSPITAL_BASED_OUTPATIENT_CLINIC_OR_DEPARTMENT_OTHER): Payer: BC Managed Care – PPO

## 2012-09-20 ENCOUNTER — Other Ambulatory Visit: Payer: Self-pay | Admitting: Oncology

## 2012-09-20 ENCOUNTER — Other Ambulatory Visit (HOSPITAL_BASED_OUTPATIENT_CLINIC_OR_DEPARTMENT_OTHER): Payer: BC Managed Care – PPO | Admitting: Lab

## 2012-09-20 VITALS — BP 134/68 | HR 80 | Temp 98.1°F | Resp 20

## 2012-09-20 DIAGNOSIS — Z5112 Encounter for antineoplastic immunotherapy: Secondary | ICD-10-CM

## 2012-09-20 DIAGNOSIS — C9002 Multiple myeloma in relapse: Secondary | ICD-10-CM

## 2012-09-20 DIAGNOSIS — C9000 Multiple myeloma not having achieved remission: Secondary | ICD-10-CM

## 2012-09-20 LAB — CBC WITH DIFFERENTIAL/PLATELET
BASO%: 0.8 % (ref 0.0–2.0)
LYMPH%: 11.4 % — ABNORMAL LOW (ref 14.0–49.0)
MCHC: 34.8 g/dL (ref 32.0–36.0)
MCV: 88.3 fL (ref 79.3–98.0)
MONO#: 0.1 10*3/uL (ref 0.1–0.9)
MONO%: 1.8 % (ref 0.0–14.0)
Platelets: 189 10*3/uL (ref 140–400)
RBC: 4.21 10*6/uL (ref 4.20–5.82)
RDW: 14.7 % — ABNORMAL HIGH (ref 11.0–14.6)
WBC: 5.6 10*3/uL (ref 4.0–10.3)

## 2012-09-20 MED ORDER — HEPARIN SOD (PORK) LOCK FLUSH 100 UNIT/ML IV SOLN
500.0000 [IU] | Freq: Once | INTRAVENOUS | Status: AC | PRN
  Administered 2012-09-20: 500 [IU]
  Filled 2012-09-20: qty 5

## 2012-09-20 MED ORDER — SODIUM CHLORIDE 0.9 % IJ SOLN
10.0000 mL | INTRAMUSCULAR | Status: DC | PRN
  Administered 2012-09-20: 10 mL
  Filled 2012-09-20: qty 10

## 2012-09-20 MED ORDER — DEXTROSE 5 % IV SOLN
27.0000 mg/m2 | Freq: Once | INTRAVENOUS | Status: AC
  Administered 2012-09-20: 60 mg via INTRAVENOUS
  Filled 2012-09-20: qty 30

## 2012-09-20 MED ORDER — ONDANSETRON 8 MG/50ML IVPB (CHCC)
8.0000 mg | Freq: Once | INTRAVENOUS | Status: AC
  Administered 2012-09-20: 8 mg via INTRAVENOUS

## 2012-09-20 MED ORDER — SODIUM CHLORIDE 0.9 % IV SOLN
Freq: Once | INTRAVENOUS | Status: AC
  Administered 2012-09-20: 11:00:00 via INTRAVENOUS

## 2012-09-20 NOTE — Patient Instructions (Addendum)
[facility] Discharge Instructions for Patients Receiving Chemotherapy  Today you received the following chemotherapy agents Kyprolis To help prevent nausea and vomiting after your treatment, we encourage you to take your nausea medication as prescribed.  If you develop nausea and vomiting that is not controlled by your nausea medication, call the clinic.   BELOW ARE SYMPTOMS THAT SHOULD BE REPORTED IMMEDIATELY:  *FEVER GREATER THAN 100.5 F  *CHILLS WITH OR WITHOUT FEVER  NAUSEA AND VOMITING THAT IS NOT CONTROLLED WITH YOUR NAUSEA MEDICATION  *UNUSUAL SHORTNESS OF BREATH  *UNUSUAL BRUISING OR BLEEDING  TENDERNESS IN MOUTH AND THROAT WITH OR WITHOUT PRESENCE OF ULCERS  *URINARY PROBLEMS  *BOWEL PROBLEMS  UNUSUAL RASH Items with * indicate a potential emergency and should be followed up as soon as possible.  Feel free to call the clinic you have any questions or concerns. The clinic phone number is [phone].    

## 2012-09-21 ENCOUNTER — Ambulatory Visit (HOSPITAL_BASED_OUTPATIENT_CLINIC_OR_DEPARTMENT_OTHER): Payer: BC Managed Care – PPO

## 2012-09-21 VITALS — BP 126/66 | HR 60 | Temp 97.4°F | Resp 18

## 2012-09-21 DIAGNOSIS — C9000 Multiple myeloma not having achieved remission: Secondary | ICD-10-CM

## 2012-09-21 DIAGNOSIS — Z5112 Encounter for antineoplastic immunotherapy: Secondary | ICD-10-CM

## 2012-09-21 DIAGNOSIS — C9002 Multiple myeloma in relapse: Secondary | ICD-10-CM

## 2012-09-21 MED ORDER — ONDANSETRON 8 MG/50ML IVPB (CHCC)
8.0000 mg | Freq: Once | INTRAVENOUS | Status: AC
Start: 2012-09-21 — End: 2012-09-21
  Administered 2012-09-21: 8 mg via INTRAVENOUS

## 2012-09-21 MED ORDER — SODIUM CHLORIDE 0.9 % IV SOLN
Freq: Once | INTRAVENOUS | Status: AC
  Administered 2012-09-21: 10:00:00 via INTRAVENOUS

## 2012-09-21 MED ORDER — DEXTROSE 5 % IV SOLN
27.0000 mg/m2 | Freq: Once | INTRAVENOUS | Status: AC
  Administered 2012-09-21: 60 mg via INTRAVENOUS
  Filled 2012-09-21: qty 30

## 2012-09-21 NOTE — Patient Instructions (Addendum)
[facility] Discharge Instructions for Patients Receiving Chemotherapy  Today you received the following chemotherapy agents Kyprolis.  To help prevent nausea and vomiting after your treatment, we encourage you to take your nausea medication.   If you develop nausea and vomiting that is not controlled by your nausea medication, call the clinic.   BELOW ARE SYMPTOMS THAT SHOULD BE REPORTED IMMEDIATELY:  *FEVER GREATER THAN 100.5 F  *CHILLS WITH OR WITHOUT FEVER  NAUSEA AND VOMITING THAT IS NOT CONTROLLED WITH YOUR NAUSEA MEDICATION  *UNUSUAL SHORTNESS OF BREATH  *UNUSUAL BRUISING OR BLEEDING  TENDERNESS IN MOUTH AND THROAT WITH OR WITHOUT PRESENCE OF ULCERS  *URINARY PROBLEMS  *BOWEL PROBLEMS  UNUSUAL RASH Items with * indicate a potential emergency and should be followed up as soon as possible.  Feel free to call the clinic you have any questions or concerns. The clinic phone number is [phone].    

## 2012-09-27 ENCOUNTER — Other Ambulatory Visit (HOSPITAL_BASED_OUTPATIENT_CLINIC_OR_DEPARTMENT_OTHER): Payer: BC Managed Care – PPO | Admitting: Lab

## 2012-09-27 ENCOUNTER — Other Ambulatory Visit: Payer: Self-pay | Admitting: *Deleted

## 2012-09-27 ENCOUNTER — Telehealth: Payer: Self-pay | Admitting: *Deleted

## 2012-09-27 ENCOUNTER — Ambulatory Visit (HOSPITAL_BASED_OUTPATIENT_CLINIC_OR_DEPARTMENT_OTHER): Payer: BC Managed Care – PPO

## 2012-09-27 VITALS — BP 129/47 | HR 74 | Temp 97.9°F | Resp 18

## 2012-09-27 DIAGNOSIS — C9000 Multiple myeloma not having achieved remission: Secondary | ICD-10-CM

## 2012-09-27 DIAGNOSIS — Z5112 Encounter for antineoplastic immunotherapy: Secondary | ICD-10-CM

## 2012-09-27 DIAGNOSIS — C9002 Multiple myeloma in relapse: Secondary | ICD-10-CM

## 2012-09-27 LAB — CBC WITH DIFFERENTIAL/PLATELET
Basophils Absolute: 0 10*3/uL (ref 0.0–0.1)
EOS%: 1.6 % (ref 0.0–7.0)
HCT: 39.6 % (ref 38.4–49.9)
HGB: 13.7 g/dL (ref 13.0–17.1)
MCH: 30.4 pg (ref 27.2–33.4)
MONO#: 0.1 10*3/uL (ref 0.1–0.9)
NEUT#: 6 10*3/uL (ref 1.5–6.5)
NEUT%: 87 % — ABNORMAL HIGH (ref 39.0–75.0)
RDW: 14.7 % — ABNORMAL HIGH (ref 11.0–14.6)
WBC: 6.9 10*3/uL (ref 4.0–10.3)
lymph#: 0.7 10*3/uL — ABNORMAL LOW (ref 0.9–3.3)

## 2012-09-27 MED ORDER — SODIUM CHLORIDE 0.9 % IJ SOLN
10.0000 mL | INTRAMUSCULAR | Status: DC | PRN
  Administered 2012-09-27: 10 mL
  Filled 2012-09-27: qty 10

## 2012-09-27 MED ORDER — SODIUM CHLORIDE 0.9 % IV SOLN
Freq: Once | INTRAVENOUS | Status: AC
  Administered 2012-09-27: 10:00:00 via INTRAVENOUS

## 2012-09-27 MED ORDER — DEXTROSE 5 % IV SOLN
27.0000 mg/m2 | Freq: Once | INTRAVENOUS | Status: AC
  Administered 2012-09-27: 60 mg via INTRAVENOUS
  Filled 2012-09-27: qty 30

## 2012-09-27 MED ORDER — HEPARIN SOD (PORK) LOCK FLUSH 100 UNIT/ML IV SOLN
500.0000 [IU] | Freq: Once | INTRAVENOUS | Status: AC | PRN
  Administered 2012-09-27: 500 [IU]
  Filled 2012-09-27: qty 5

## 2012-09-27 MED ORDER — ONDANSETRON 8 MG/50ML IVPB (CHCC)
8.0000 mg | Freq: Once | INTRAVENOUS | Status: AC
  Administered 2012-09-27: 8 mg via INTRAVENOUS

## 2012-09-27 MED ORDER — SODIUM CHLORIDE 0.9 % IV SOLN: Freq: Once | INTRAVENOUS | Status: DC

## 2012-09-27 NOTE — Patient Instructions (Signed)
[facility] Discharge Instructions for Patients Receiving Chemotherapy  Today you received the following chemotherapy agents: kyprolis  To help prevent nausea and vomiting after your treatment, we encourage you to take your nausea medication.  Take it as often as prescribed.     If you develop nausea and vomiting that is not controlled by your nausea medication, call the clinic. If it is after clinic hours your family physician or the after hours number for the clinic or go to the Emergency Department.   BELOW ARE SYMPTOMS THAT SHOULD BE REPORTED IMMEDIATELY:  *FEVER GREATER THAN 100.5 F  *CHILLS WITH OR WITHOUT FEVER  NAUSEA AND VOMITING THAT IS NOT CONTROLLED WITH YOUR NAUSEA MEDICATION  *UNUSUAL SHORTNESS OF BREATH  *UNUSUAL BRUISING OR BLEEDING  TENDERNESS IN MOUTH AND THROAT WITH OR WITHOUT PRESENCE OF ULCERS  *URINARY PROBLEMS  *BOWEL PROBLEMS  UNUSUAL RASH Items with * indicate a potential emergency and should be followed up as soon as possible.  Feel free to call the clinic you have any questions or concerns. The clinic phone number is [phone].   I have been informed and understand all the instructions given to me. I know to contact the clinic, my physician, or go to the Emergency Department if any problems should occur. I do not have any questions at this time, but understand that I may call the clinic during office hours   should I have any questions or need assistance in obtaining follow up care.    __________________________________________  _____________  __________ Signature of Patient or Authorized Representative            Date                   Time    __________________________________________ Nurse's Signature    

## 2012-09-27 NOTE — Telephone Encounter (Signed)
Pt in for Kyprolis treatment today, per infusion nurse has been having intermittent chest pain with activity. Pt is requesting referral to cardiology. Reviewed with Dr. Truett Perna, referral sent to [facility] cardiology.

## 2012-09-28 ENCOUNTER — Emergency Department (HOSPITAL_COMMUNITY)
Admission: EM | Admit: 2012-09-28 | Discharge: 2012-09-28 | Disposition: A | Discharge status: Home / Self Care | Payer: BC Managed Care – PPO | Attending: Emergency Medicine | Admitting: Emergency Medicine

## 2012-09-28 ENCOUNTER — Telehealth: Payer: Self-pay | Admitting: *Deleted

## 2012-09-28 ENCOUNTER — Other Ambulatory Visit: Payer: Self-pay

## 2012-09-28 ENCOUNTER — Other Ambulatory Visit: Payer: Self-pay | Admitting: *Deleted

## 2012-09-28 ENCOUNTER — Emergency Department (HOSPITAL_COMMUNITY): Payer: BC Managed Care – PPO

## 2012-09-28 ENCOUNTER — Encounter (HOSPITAL_COMMUNITY): Payer: Self-pay | Admitting: Emergency Medicine

## 2012-09-28 ENCOUNTER — Ambulatory Visit: Payer: BC Managed Care – PPO

## 2012-09-28 DIAGNOSIS — R0602 Shortness of breath: Secondary | ICD-10-CM

## 2012-09-28 DIAGNOSIS — Z9861 Coronary angioplasty status: Secondary | ICD-10-CM | POA: Insufficient documentation

## 2012-09-28 DIAGNOSIS — Z8619 Personal history of other infectious and parasitic diseases: Secondary | ICD-10-CM | POA: Insufficient documentation

## 2012-09-28 DIAGNOSIS — Z7982 Long term (current) use of aspirin: Secondary | ICD-10-CM | POA: Insufficient documentation

## 2012-09-28 DIAGNOSIS — I10 Essential (primary) hypertension: Secondary | ICD-10-CM | POA: Insufficient documentation

## 2012-09-28 DIAGNOSIS — Z87898 Personal history of other specified conditions: Secondary | ICD-10-CM | POA: Insufficient documentation

## 2012-09-28 DIAGNOSIS — Z8709 Personal history of other diseases of the respiratory system: Secondary | ICD-10-CM | POA: Insufficient documentation

## 2012-09-28 DIAGNOSIS — R61 Generalized hyperhidrosis: Secondary | ICD-10-CM | POA: Insufficient documentation

## 2012-09-28 DIAGNOSIS — R0789 Other chest pain: Secondary | ICD-10-CM | POA: Insufficient documentation

## 2012-09-28 DIAGNOSIS — R079 Chest pain, unspecified: Secondary | ICD-10-CM

## 2012-09-28 DIAGNOSIS — Z79899 Other long term (current) drug therapy: Secondary | ICD-10-CM | POA: Insufficient documentation

## 2012-09-28 DIAGNOSIS — Z87891 Personal history of nicotine dependence: Secondary | ICD-10-CM | POA: Insufficient documentation

## 2012-09-28 HISTORY — DX: Essential (primary) hypertension: I10

## 2012-09-28 LAB — POCT I-STAT TROPONIN I: Troponin i, poc: 0.01 ng/mL (ref 0.00–0.08)

## 2012-09-28 LAB — CBC WITH DIFFERENTIAL/PLATELET
Basophils Absolute: 0 10*3/uL (ref 0.0–0.1)
HCT: 37.2 % — ABNORMAL LOW (ref 39.0–52.0)
Hemoglobin: 12.6 g/dL — ABNORMAL LOW (ref 13.0–17.0)
Lymphocytes Relative: 9 % — ABNORMAL LOW (ref 12–46)
Lymphs Abs: 1.3 10*3/uL (ref 0.7–4.0)
Monocytes Absolute: 1.1 10*3/uL — ABNORMAL HIGH (ref 0.1–1.0)
Monocytes Relative: 7 % (ref 3–12)
Neutro Abs: 12.7 10*3/uL — ABNORMAL HIGH (ref 1.7–7.7)
RBC: 4.21 MIL/uL — ABNORMAL LOW (ref 4.22–5.81)
WBC: 15.1 10*3/uL — ABNORMAL HIGH (ref 4.0–10.5)

## 2012-09-28 LAB — BASIC METABOLIC PANEL
CO2: 23 mEq/L (ref 19–32)
Chloride: 100 mEq/L (ref 96–112)
Creatinine, Ser: 1.16 mg/dL (ref 0.50–1.35)

## 2012-09-28 MED ORDER — NITROGLYCERIN 0.3 MG SL SUBL: 0.3000 mg | SUBLINGUAL_TABLET | SUBLINGUAL | Status: AC | PRN

## 2012-09-28 NOTE — Telephone Encounter (Signed)
Call from ED RN, pt is asking if he should return to office for his Kyprolis tx today after DC from ED. Reviewed with Dr. Truett Perna: Cancel today's treatment. Left message on voicemail for pt.

## 2012-09-28 NOTE — ED Provider Notes (Signed)
History    CSN: [id number] Arrival date & time 09/28/12  1002  First MD Initiated Contact with Patient 09/28/12 1003     Chief Complaint  Patient presents with  . Chest Pain   (Consider location/radiation/quality/duration/timing/severity/associated sxs/prior Treatment) HPI Comments: 62 y/o male with a hx of multiple myeloma, diagnosed in 2008 and out of remission since 2011 (currently getting weekly Kyprolis infusions), pulmonary aspergillosis in 04/2011, and HTN presents for chest pain pain x 4 weeks. Chest pain is intermittent and dull in nature; substernal and radiating outward to the L and R chest. Patient states that symptoms are brought on with exertion and improved with rest; he has noticed that when he walks up a hill near his job site that he starts to feel this chest pain about halfway up the hill. He admits to associated diaphoresis with these episodes. Last onset of this CP was last night while walking in [place]. He denies associated fevers, vision changes, lightheadedness, dizziness, jaw pain, SOB, N/V, numbness/tingling, and extremity weakness. Patient was recently given a referral to [facility] cards for follow up regarding chest pain; called, but has yet to be called back with an appt date.  Oncologist - Thornton Papas. Relocated to [facility] in May 2014. Endorses having a cardiac cath in 2013; per patient, pulmonary aspergillosis affected myocardium. Stress test at this time was negative for CAD.  HTN meds - Diovan 80mg  daily and Coreg 6.25mg  daily  The history is provided by the patient. No language interpreter was used.   Past Medical History  Diagnosis Date  . Hypertension   . Cancer    Past Surgical History  Procedure Laterality Date  . Portacath placement      right chest   History reviewed. No pertinent family history. History  Substance Use Topics  . Smoking status: Former Smoker    Quit date: 09/29/2002  . Smokeless tobacco: Never Used  . Alcohol Use: No     Review of Systems  Constitutional: Positive for diaphoresis.  Respiratory: Negative for shortness of breath.   Cardiovascular: Positive for chest pain.  Gastrointestinal: Negative for nausea and vomiting.  Neurological: Negative for weakness and numbness.  All other systems reviewed and are negative.   Allergies  Review of patient's allergies indicates no known allergies.  Home Medications   Current Outpatient Rx  Name  Route  Sig  Dispense  Refill  . acyclovir (ZOVIRAX) 400 MG tablet   Oral   Take 400 mg by mouth every 12 (twelve) hours. Take 1 tablet by mouth every 12 hours to prevent shingles while on chemo         . aspirin 81 MG chewable tablet   Oral   Chew 81 mg by mouth daily.         . carvedilol (COREG) 6.25 MG tablet   Oral   Take 6.25 mg by mouth every morning.         Marland Kitchen dexamethasone (DECADRON) 4 MG tablet   Oral   Take 20 mg by mouth. Once weekly on Thursday.         . esomeprazole (NEXIUM) 40 MG capsule   Oral   Take 40 mg by mouth daily before breakfast.         . fentaNYL (DURAGESIC - DOSED MCG/HR) 25 MCG/HR   Transdermal   Place 1 patch (25 mcg total) onto the skin every 3 (three) days.   10 patch   0   . furosemide (LASIX) 20 MG tablet  Oral   Take 40 mg by mouth 2 (two) times daily.          Marland Kitchen HYDROcodone-acetaminophen (NORCO) 10-325 MG per tablet   Oral   Take 1 tablet by mouth every 6 (six) hours as needed for pain.         Marland Kitchen lenalidomide (REVLIMID) 10 MG capsule   Oral   Take 10 mg by mouth at bedtime. For 21 days, then 7 days off.         . ondansetron (ZOFRAN) 8 MG tablet   Oral   Take 1 tablet (8 mg total) by mouth every 12 (twelve) hours as needed for nausea.   30 tablet   1   . potassium chloride (K-DUR) 10 MEQ tablet   Oral   Take 10 mEq by mouth 2 (two) times daily.         . pregabalin (LYRICA) 50 MG capsule   Oral   Take 50 mg by mouth 2 (two) times daily.         Marland Kitchen PRESCRIPTION MEDICATION    Intravenous   Inject 60 mg into the vein once. carfilzomib (KYPROLIS) 60 mg in dextrose 5 % 50 mL chemo infusion 27 mg/m2  2.2 m2 (Treatment Plan Actual) Once 09/27/2012         . valsartan (DIOVAN) 80 MG tablet   Oral   Take 1 tablet (80 mg total) by mouth daily.   90 tablet   0   . nitroGLYCERIN (NITROSTAT) 0.3 MG SL tablet   Sublingual   Place 1 tablet (0.3 mg total) under the tongue every 5 (five) minutes as needed for chest pain. No more than 3 doses in period of 15 minutes per episode.   90 tablet   1    BP 112/67  Pulse 53  Temp(Src) 97.6 F (36.4 C) (Oral)  Resp 14  Ht 6' (1.829 m)  Wt 210 lb (95.255 kg)  BMI 28.47 kg/m2  SpO2 97% Physical Exam  Nursing note and vitals reviewed. Constitutional: He is oriented to person, place, and time. He appears well-developed and well-nourished. No distress.  HENT:  Head: Normocephalic and atraumatic.  Mouth/Throat: Oropharynx is clear and moist. No oropharyngeal exudate.  Eyes: Conjunctivae and EOM are normal. Pupils are equal, round, and reactive to light. No scleral icterus.  Neck: Normal range of motion.  Cardiovascular: Normal rate, regular rhythm, normal heart sounds and intact distal pulses.   Pulmonary/Chest: Effort normal and breath sounds normal. No respiratory distress. He has no wheezes. He has no rales. He exhibits no tenderness.  Chest pain not reproducible on palpation  Abdominal: Soft. There is no tenderness. There is no rebound and no guarding.  Musculoskeletal: Normal range of motion. He exhibits no edema.  Neurological: He is alert and oriented to person, place, and time.  Skin: Skin is warm and dry. No rash noted. He is not diaphoretic. No erythema. No pallor.  Psychiatric: He has a normal mood and affect. His behavior is normal.    ED Course  Procedures (including critical care time) Labs Reviewed  CBC WITH DIFFERENTIAL - Abnormal; Notable for the following:    WBC 15.1 (*)    RBC 4.21 (*)     Hemoglobin 12.6 (*)    HCT 37.2 (*)    Neutrophils Relative % 84 (*)    Neutro Abs 12.7 (*)    Lymphocytes Relative 9 (*)    Monocytes Absolute 1.1 (*)    All other components within  normal limits  BASIC METABOLIC PANEL - Abnormal; Notable for the following:    Glucose, Bld 131 (*)    BUN 24 (*)    GFR calc non Af Amer 67 (*)    GFR calc Af Amer 77 (*)    All other components within normal limits  POCT I-STAT TROPONIN I    Date: 09/28/2012  Rate: 60  Rhythm: sinus arrhythmia and premature ventricular contractions (PVC) x 1  QRS Axis: normal  Intervals: normal  ST/T Wave abnormalities: normal  Conduction Disutrbances:none  Narrative Interpretation: sinus arrythmia with PVC x 1; no STEMI  Old EKG Reviewed: unchanged from 09/13/12 I have personally reviewed and interpreted this EKG  Dg Chest 2 View  09/28/2012   *RADIOLOGY REPORT*  Clinical Data: Shortness of breath  CHEST - 2 VIEW  Comparison: None.  Findings: Cardiomediastinal silhouette appears normal.  No acute pulmonary disease is noted.  Bony thorax is intact.  Right internal jugular Port-A-Cath is noted with tip in expected position of the SVC.  IMPRESSION: No acute cardiopulmonary abnormality seen.   Original Report Authenticated By: Lupita Raider.,  M.D.   1. Chest discomfort    MDM  62 year old male who presents for symptoms consistent with stable angina x 3-4 weeks. Patient asymptomatic on arrival; presents from chemo infusion. He is well and nontoxic appearing and hemodynamically stable. Patient endorsing no evidence of coronary artery disease on cardiac catheterization in early 2013; cath was done after pulmonary aspergillosis was found to have affected his myocardium. Work up to include CBC, BMP, troponin, and CXR. EKG as above and unchanged from prior.  Work up in ED unremarkable. Have consulted with [facility] who will come assess the patient. Patient has remained asymptomatic since initial  presentation.  Patient seen by Dr. Elease Hashimoto who believes patient stable for outpatient follow up; requested patient d/c with Rx for NTG after patient scheduled for outpatient myoview. Rx written and discharge instructions provided with strict return precautions. Patient agreeable to plan.            Antony Madura, PA-C 09/28/12 2125

## 2012-09-28 NOTE — Progress Notes (Signed)
9:30 Pt in today for Kyprolis treatment. Pt c/o increase of frequency of chest pain on exertion with prior onset of 3-4 weeks ago. Pt states he had an episode of chest pain last pm as he was walking through [place]. He described the pain as "pressure" midline chest radiating out to bilateral shoulders. He denies sob,dizziness,nausea, light headedness, but admits he is diaphoretic during these episodes of chest pain. He states chest pain resolves when he is at rest. Pt presently asymptomatic. V/S taken and wnl. Dr Myrle Sheng informed of pt's complaints and gave instructions for pt to go to ED now and will not receive tx today.   Pt was informed of Dr. Nolon Rod recommendation to go to ED and was taken to ED by Regina,NA via wheelchair.

## 2012-09-28 NOTE — ED Notes (Signed)
Pt states that he has chest pain intermittently when he exerts himself that has been going on over the past 3-4 weeks.  Pt states it's right in the center of his chest when it comes on that radiates to the right and left and feels like a dull , "idegestion like pain".  It happened last night when he went to the [place] with his wife.  Pt states that the pain does go away after he sits and rests.

## 2012-09-28 NOTE — Telephone Encounter (Signed)
Message copied by Harriet Butte on Fri Sep 28, 2012  4:32 PM ------      Message from: Vesta Mixer      Created: Fri Sep 28, 2012  3:47 PM       Sheila Oats,            Please call Arthur Speagle       [phone] to arrange stress myoview  Next week.  He'll need instructions to office and about not drinking caffiene.            Thanks            Phil                   ------

## 2012-09-28 NOTE — Telephone Encounter (Signed)
Attempted to call pt to go over the Stress test instructions. Left a message to call back. Order is in EPIC. Esmond Camper' Brian called to schedule test for next week and spoke with pt's wife " Lennox Laity" at phone # [phone], wife states, that pt/wife  are going on a vacation, and are not coming back for two weeks. Pt/ wife will call back when they return to schedule the test.

## 2012-09-28 NOTE — Consult Note (Signed)
CONSULT NOTE  Date: 09/28/2012               Patient Name:  Walter Dennis MRN: [id number]  DOB: 14-Sep-1950 Age / Sex: 62 y.o., male        PCP: No PCP Per Patient Primary Cardiologist: New to [facility]            Referring Physician: [facility],              Reason for Consult: Exertional CP           History of Present Illness: Patient is a 62 y.o. male with a PMHx of multiple myeloma, who was admitted to [facility] on 09/28/2012 for evaluation of  Exertional CP  He has had chest pressure with exertion for the past 3 weeks or so.  Occurs with walking, playing golf,   - never at rest.    The pain is not associated with diaphoresis or dyspnea.  No pre syncope.   Medications: Outpatient medications:  (Not in a hospital admission)  Current medications: No current facility-administered medications for this encounter.   Current Outpatient Prescriptions  Medication Sig Dispense Refill  . acyclovir (ZOVIRAX) 400 MG tablet Take 400 mg by mouth every 12 (twelve) hours. Take 1 tablet by mouth every 12 hours to prevent shingles while on chemo      . aspirin 81 MG chewable tablet Chew 81 mg by mouth daily.      . carvedilol (COREG) 6.25 MG tablet Take 6.25 mg by mouth every morning.      Marland Kitchen dexamethasone (DECADRON) 4 MG tablet Take 20 mg by mouth. Once weekly on Thursday.      . esomeprazole (NEXIUM) 40 MG capsule Take 40 mg by mouth daily before breakfast.      . fentaNYL (DURAGESIC - DOSED MCG/HR) 25 MCG/HR Place 1 patch (25 mcg total) onto the skin every 3 (three) days.  10 patch  0  . furosemide (LASIX) 20 MG tablet Take 40 mg by mouth 2 (two) times daily.       Marland Kitchen HYDROcodone-acetaminophen (NORCO) 10-325 MG per tablet Take 1 tablet by mouth every 6 (six) hours as needed for pain.      Marland Kitchen lenalidomide (REVLIMID) 10 MG capsule Take 10 mg by mouth at bedtime. For 21 days, then 7 days off.      . ondansetron (ZOFRAN) 8 MG tablet Take 1 tablet (8 mg total) by mouth every 12 (twelve) hours as needed for  nausea.  30 tablet  1  . potassium chloride (K-DUR) 10 MEQ tablet Take 10 mEq by mouth 2 (two) times daily.      . pregabalin (LYRICA) 50 MG capsule Take 50 mg by mouth 2 (two) times daily.      Marland Kitchen PRESCRIPTION MEDICATION Inject 60 mg into the vein once. carfilzomib (KYPROLIS) 60 mg in dextrose 5 % 50 mL chemo infusion 27 mg/m2  2.2 m2 (Treatment Plan Actual) Once 09/27/2012      . valsartan (DIOVAN) 80 MG tablet Take 1 tablet (80 mg total) by mouth daily.  90 tablet  0   Facility-Administered Medications Ordered in Other Encounters  Medication Dose Route Frequency Provider Last Rate Last Dose  . sodium chloride 0.9 % injection 10 mL  10 mL Intracatheter PRN Ladene Artist, MD         No Known Allergies   Past Medical History  Diagnosis Date  . Hypertension   . Cancer     Past  Surgical History  Procedure Laterality Date  . Portacath placement      right chest    History reviewed. No pertinent family history.  Social History:  reports that he quit smoking about 10 years ago. He has never used smokeless tobacco. He reports that he does not drink alcohol or use illicit drugs.   Review of Systems: Constitutional:  denies fever, chills, diaphoresis, appetite change and fatigue.  HEENT: denies photophobia, eye pain, redness, hearing loss, ear pain, congestion, sore throat, rhinorrhea, sneezing, neck pain, neck stiffness and tinnitus.  Respiratory: denies SOB,    Cardiovascular: admits to chest pain,  Gastrointestinal: denies nausea, vomiting, abdominal pain, diarrhea, constipation, blood in stool.  Genitourinary: denies dysuria, urgency, frequency, hematuria, flank pain and difficulty urinating.  Musculoskeletal: denies  myalgias, back pain, joint swelling, arthralgias and gait problem.   Skin: denies pallor, rash and wound.  Neurological: denies dizziness, seizures, syncope, weakness, light-headedness, numbness and headaches.   Hematological: denies adenopathy, easy bruising,  personal or family bleeding history.  Psychiatric/ Behavioral: denies suicidal ideation, mood changes, confusion, nervousness, sleep disturbance and agitation.    Physical Exam: BP 112/67  Pulse 53  Temp(Src) 97.6 F (36.4 C) (Oral)  Resp 14  Ht 6' (1.829 m)  Wt 210 lb (95.255 kg)  BMI 28.47 kg/m2  SpO2 97%  General: Vital signs reviewed and noted. Well-developed, well-nourished, in no acute distress; alert, appropriate and cooperative throughout examination.  Head: Normocephalic, atraumatic, sclera anicteric, mucus membranes are moist  Neck: Supple. Negative for carotid bruits. JVD not elevated.  Lungs:  Clear bilaterally to auscultation without wheezes, rales, or rhonchi. Breathing is unlabored.  Heart: RRR with S1 S2. No murmurs, rubs, or gallops appreciated.  Abdomen:  Soft, non-tender, non-distended with normoactive bowel sounds. No hepatomegaly. No rebound/guarding. No obvious abdominal masses  MSK: Strength and the appear normal for age.  Extremities: No clubbing or cyanosis. No edema.  Distal pedal pulses are 2+ and equal bilaterally.  Neurologic: Alert and oriented X 3. Moves all extremities spontaneously.  Psych: Responds to questions appropriately with a normal affect.    Lab results: Basic Metabolic Panel:  Recent Labs Lab 09/28/12 1050  NA 137  K 4.1  CL 100  CO2 23  GLUCOSE 131*  BUN 24*  CREATININE 1.16  CALCIUM 9.6    Liver Function Tests: No results found for this basename: AST, ALT, ALKPHOS, BILITOT, PROT, ALBUMIN,  in the last 168 hours No results found for this basename: LIPASE, AMYLASE,  in the last 168 hours No results found for this basename: AMMONIA,  in the last 168 hours  CBC:  Recent Labs Lab 09/27/12 0938 09/28/12 1050  WBC 6.9 15.1*  NEUTROABS 6.0 12.7*  HGB 13.7 12.6*  HCT 39.6 37.2*  MCV 87.9 88.4  PLT 194 217    Cardiac Enzymes: No results found for this basename: CKTOTAL, CKMB, CKMBINDEX, TROPONINI,  in the last 168  hours  BNP: No components found with this basename: POCBNP,   CBG: No results found for this basename: GLUCAP,  in the last 168 hours  Coagulation Studies: No results found for this basename: LABPROT, INR,  in the last 72 hours   Other results:  EKG : NSR, no St or T wave changes, 1 PVC   Imaging: Dg Chest 2 View  09/28/2012   *RADIOLOGY REPORT*  Clinical Data: Shortness of breath  CHEST - 2 VIEW  Comparison: None.  Findings: Cardiomediastinal silhouette appears normal.  No acute pulmonary disease is  noted.  Bony thorax is intact.  Right internal jugular Port-A-Cath is noted with tip in expected position of the SVC.  IMPRESSION: No acute cardiopulmonary abnormality seen.   Original Report Authenticated By: Lupita Raider.,  M.D.     Stress test in [place], [place] was normal.   Assessment & Plan:  1. Chest pain:  Patient has had some exertional symptoms - mild CP.  No dyspnea, no sweats.  He is seen in the [facility] ER.   ECG is unremarkable.  No CP today at all.  Will set him up for a stress myoview next week.. I will be out of town - the DOD will need to arrange follow up as needed - ? Cath vs. Office visit.  Cell number is [phone]. He is stable and wants to go home.  Will give him a script for NTG.  He is on asa.    DVT PPX -    Alvia Grove., MD, [facility] 09/28/2012, 3:35 PM

## 2012-09-28 NOTE — Telephone Encounter (Signed)
Unable to speak with pt  Stress test Myoview Order is in EPIC.

## 2012-09-28 NOTE — ED Notes (Signed)
[id number]<AV> Expected date:<BR> Expected time:<BR> Means of arrival:<BR> Comments:<BR> Cancer ctr-chest pain

## 2012-10-01 ENCOUNTER — Telehealth: Payer: Self-pay | Admitting: Oncology

## 2012-10-01 NOTE — ED Provider Notes (Signed)
Medical screening examination/treatment/procedure(s) were conducted as a shared visit with non-physician practitioner(s) and myself.  I personally evaluated the patient during the encounter   Loren Racer, MD 10/01/12 [phone]

## 2012-10-01 NOTE — Telephone Encounter (Signed)
Called [facility] and scheduled pt for September, order was ASAP, infoprmed Clinical research associate of the reason as chest pain, took appt for september and  informed  Tania  RN, pt end up in the ER per nurse

## 2012-10-01 NOTE — Telephone Encounter (Signed)
See prior note/ pt on vac x 2 weeks, paperwork taken to pcc to call and arrange.

## 2012-10-02 ENCOUNTER — Telehealth: Payer: Self-pay | Admitting: Cardiovascular Disease

## 2012-10-02 NOTE — Telephone Encounter (Signed)
7/22 called pt's wife to schedule Stress test left voice mail. This is my second attempt.  Will forward to [place].Marland Kitchen

## 2012-10-02 NOTE — Telephone Encounter (Signed)
On 7/18 Dr. Melburn Popper ordered Stress test for pt.  Pt is out of town and would like to schedule when he gets back in two weeks.  Will forward message to NR.     OB

## 2012-10-15 ENCOUNTER — Other Ambulatory Visit (HOSPITAL_BASED_OUTPATIENT_CLINIC_OR_DEPARTMENT_OTHER): Payer: BC Managed Care – PPO

## 2012-10-15 DIAGNOSIS — C9000 Multiple myeloma not having achieved remission: Secondary | ICD-10-CM

## 2012-10-15 DIAGNOSIS — C9002 Multiple myeloma in relapse: Secondary | ICD-10-CM

## 2012-10-15 LAB — CBC WITH DIFFERENTIAL/PLATELET
BASO%: 1.5 % (ref 0.0–2.0)
Basophils Absolute: 0.1 10*3/uL (ref 0.0–0.1)
EOS%: 3.8 % (ref 0.0–7.0)
HGB: 12.8 g/dL — ABNORMAL LOW (ref 13.0–17.1)
MCH: 30.3 pg (ref 27.2–33.4)
MCHC: 34.2 g/dL (ref 32.0–36.0)
MCV: 88.7 fL (ref 79.3–98.0)
MONO%: 6.8 % (ref 0.0–14.0)
RDW: 14.9 % — ABNORMAL HIGH (ref 11.0–14.6)

## 2012-10-15 LAB — BASIC METABOLIC PANEL (CC13)
BUN: 10.4 mg/dL (ref 7.0–26.0)
Creatinine: 1.1 mg/dL (ref 0.7–1.3)
Potassium: 3.8 mEq/L (ref 3.5–5.1)

## 2012-10-17 ENCOUNTER — Other Ambulatory Visit: Payer: Self-pay | Admitting: Oncology

## 2012-10-17 LAB — PROTEIN ELECTROPHORESIS, SERUM
Albumin ELP: 55.4 % — ABNORMAL LOW (ref 55.8–66.1)
Alpha-1-Globulin: 4.1 % (ref 2.9–4.9)
Beta Globulin: 6.1 % (ref 4.7–7.2)
Total Protein, Serum Electrophoresis: 7.2 g/dL (ref 6.0–8.3)

## 2012-10-17 LAB — IGG: IgG (Immunoglobin G), Serum: 1580 mg/dL (ref 650–1600)

## 2012-10-17 LAB — KAPPA/LAMBDA LIGHT CHAINS
Kappa:Lambda Ratio: 0.03 — ABNORMAL LOW (ref 0.26–1.65)
Lambda Free Lght Chn: 16.3 mg/dL — ABNORMAL HIGH (ref 0.57–2.63)

## 2012-10-18 ENCOUNTER — Ambulatory Visit (HOSPITAL_BASED_OUTPATIENT_CLINIC_OR_DEPARTMENT_OTHER): Payer: BC Managed Care – PPO

## 2012-10-18 ENCOUNTER — Other Ambulatory Visit: Payer: BC Managed Care – PPO | Admitting: Lab

## 2012-10-18 ENCOUNTER — Ambulatory Visit (HOSPITAL_BASED_OUTPATIENT_CLINIC_OR_DEPARTMENT_OTHER): Payer: BC Managed Care – PPO | Admitting: Oncology

## 2012-10-18 VITALS — BP 124/64 | HR 54 | Temp 97.9°F | Resp 18 | Ht 72.0 in | Wt 213.8 lb

## 2012-10-18 DIAGNOSIS — I10 Essential (primary) hypertension: Secondary | ICD-10-CM

## 2012-10-18 DIAGNOSIS — C9002 Multiple myeloma in relapse: Secondary | ICD-10-CM

## 2012-10-18 DIAGNOSIS — C9000 Multiple myeloma not having achieved remission: Secondary | ICD-10-CM

## 2012-10-18 DIAGNOSIS — G609 Hereditary and idiopathic neuropathy, unspecified: Secondary | ICD-10-CM

## 2012-10-18 DIAGNOSIS — Z5112 Encounter for antineoplastic immunotherapy: Secondary | ICD-10-CM

## 2012-10-18 MED ORDER — ONDANSETRON 8 MG/50ML IVPB (CHCC)
8.0000 mg | Freq: Once | INTRAVENOUS | Status: AC
Start: 2012-10-18 — End: 2012-10-18
  Administered 2012-10-18: 8 mg via INTRAVENOUS

## 2012-10-18 MED ORDER — SODIUM CHLORIDE 0.9 % IV SOLN: Freq: Once | INTRAVENOUS | Status: DC

## 2012-10-18 MED ORDER — DEXTROSE 5 % IV SOLN: 27.0000 mg/m2 | Freq: Once | INTRAVENOUS | Status: DC

## 2012-10-18 MED ORDER — PREGABALIN 50 MG PO CAPS: 50.0000 mg | ORAL_CAPSULE | Freq: Two times a day (BID) | ORAL | Status: DC

## 2012-10-18 MED ORDER — DEXTROSE 5 % IV SOLN
27.0000 mg/m2 | Freq: Once | INTRAVENOUS | Status: AC
  Administered 2012-10-18: 60 mg via INTRAVENOUS
  Filled 2012-10-18: qty 30

## 2012-10-18 MED ORDER — HEPARIN SOD (PORK) LOCK FLUSH 100 UNIT/ML IV SOLN
500.0000 [IU] | Freq: Once | INTRAVENOUS | Status: AC | PRN
  Administered 2012-10-18: 500 [IU]
  Filled 2012-10-18: qty 5

## 2012-10-18 MED ORDER — LENALIDOMIDE 10 MG PO CAPS: 10.0000 mg | ORAL_CAPSULE | Freq: Every day | ORAL | Status: DC

## 2012-10-18 MED ORDER — SODIUM CHLORIDE 0.9 % IJ SOLN
10.0000 mL | INTRAMUSCULAR | Status: DC | PRN
  Filled 2012-10-18: qty 10

## 2012-10-18 MED ORDER — ONDANSETRON 8 MG/50ML IVPB (CHCC): 8.0000 mg | Freq: Once | INTRAVENOUS | Status: DC

## 2012-10-18 MED ORDER — SODIUM CHLORIDE 0.9 % IV SOLN
Freq: Once | INTRAVENOUS | Status: AC
  Administered 2012-10-18: 12:00:00 via INTRAVENOUS

## 2012-10-18 MED ORDER — FENTANYL 12 MCG/HR TD PT72: 1.0000 | MEDICATED_PATCH | TRANSDERMAL | Status: DC

## 2012-10-18 MED ORDER — SODIUM CHLORIDE 0.9 % IJ SOLN
10.0000 mL | INTRAMUSCULAR | Status: DC | PRN
  Administered 2012-10-18: 10 mL
  Filled 2012-10-18: qty 10

## 2012-10-18 MED ORDER — HEPARIN SOD (PORK) LOCK FLUSH 100 UNIT/ML IV SOLN
500.0000 [IU] | Freq: Once | INTRAVENOUS | Status: DC | PRN
  Filled 2012-10-18: qty 5

## 2012-10-18 NOTE — Progress Notes (Signed)
   [facility]    OFFICE PROGRESS NOTE   INTERVAL HISTORY:   He completed another cycle of Carfilzomib therapy on 09/27/2012. He did not receive treatment on 09/28/2012 secondary to chest discomfort. He reported a history of exertional chest pressure for 3 weeks. He was evaluated in the emergency room by cardiology and was felt to be stable for discharge to home. He reports today that he did not have actual chest pain, but noted palpitations. This has not recurred.  Stable neuropathy symptoms at the left foot. No other complaint.  Objective:  Vital signs in last 24 hours:  Blood pressure 124/64, pulse 54, temperature 97.9 F (36.6 C), temperature source Oral, resp. rate 18, height 6' (1.829 m), weight 213 lb 12.8 oz (96.979 kg), SpO2 99.00%.    HEENT: No thrush or ulcers Resp: Lungs clear bilaterally Cardio: Regular rate and rhythm GI: No hepatosplenomegaly Vascular: No leg edema   Portacath/PICC-without erythema  Lab Results:  Lab Results  Component Value Date   WBC 5.7 10/15/2012   HGB 12.8* 10/15/2012   HCT 37.4* 10/15/2012   MCV 88.7 10/15/2012   PLT 228 10/15/2012   ANC 3.9  Serum M spike 1.44 (1.7 08/16/2012) IgG 1580 (1730 08/16/2012) Free lambda light chains 16.3 (21.7 on 08/16/2012)   Medications: I have reviewed the patient's current medications.  Assessment/Plan: 1.Multiple myeloma, IgG lambda monoclonal protein  -Initial diagnosis 2008, bone marrow with a 40-50% plasma cells  -Lenalidomide 25 mg,day 1-21 and Decadron 40 mg weekly and 2009, complicated by peripheral neuropathy  -Therapy change to bortezomib IV twice weekly plus Decadron  -High-dose chemotherapy with autologous stem cell transplantation 2009 at [facility]  -Bortezomib, cyclophosphamide, and Decadron December 2012 through February 2014 (discontinued secondary to pulmonary aspergillosis  -January 2014 M spike rise to 1.1 g/dL from 0.1 g/dL in December 4098. Treatment started with  Carlfilzomib, lenalidomide, and Decadron.  -treatment was restarted with Carlfilzomib, lenalidomide, and Decadron 08/16/2012 -Restaging labs on 10/15/2012 consistent with improvement in the serum M. protein  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 Irritable bowel syndrome  5. Hypertension  6. "Chest pain "/palpitations July 2014-evaluated by cardiology, scheduled for outpatient followup    Disposition:  He appears to be tolerating the current regimen well. No clinical evidence for progression of the myeloma. He will begin another cycle of therapy today. We decreased the Duragesic to 12.5 mcg. He is scheduled to see Dr. Leeanne Deed next week. Mr. Trela will return for an office visit prior to the next cycle of carfilzomib.   Thornton Papas, MD  10/18/2012  6:38 PM

## 2012-10-18 NOTE — Patient Instructions (Addendum)
[facility] Discharge Instructions for Patients Receiving Chemotherapy  Today you received the following chemotherapy agents Kyprolis To help prevent nausea and vomiting after your treatment, we encourage you to take your nausea medication as prescribed.  If you develop nausea and vomiting that is not controlled by your nausea medication, call the clinic.   BELOW ARE SYMPTOMS THAT SHOULD BE REPORTED IMMEDIATELY:  *FEVER GREATER THAN 100.5 F  *CHILLS WITH OR WITHOUT FEVER  NAUSEA AND VOMITING THAT IS NOT CONTROLLED WITH YOUR NAUSEA MEDICATION  *UNUSUAL SHORTNESS OF BREATH  *UNUSUAL BRUISING OR BLEEDING  TENDERNESS IN MOUTH AND THROAT WITH OR WITHOUT PRESENCE OF ULCERS  *URINARY PROBLEMS  *BOWEL PROBLEMS  UNUSUAL RASH Items with * indicate a potential emergency and should be followed up as soon as possible.  Feel free to call the clinic you have any questions or concerns. The clinic phone number is [phone].    

## 2012-10-19 ENCOUNTER — Ambulatory Visit (HOSPITAL_BASED_OUTPATIENT_CLINIC_OR_DEPARTMENT_OTHER): Payer: BC Managed Care – PPO

## 2012-10-19 ENCOUNTER — Other Ambulatory Visit: Payer: Self-pay | Admitting: Emergency Medicine

## 2012-10-19 VITALS — BP 122/63 | HR 58 | Temp 98.2°F | Resp 18

## 2012-10-19 DIAGNOSIS — C9000 Multiple myeloma not having achieved remission: Secondary | ICD-10-CM

## 2012-10-19 DIAGNOSIS — C9002 Multiple myeloma in relapse: Secondary | ICD-10-CM

## 2012-10-19 DIAGNOSIS — Z5112 Encounter for antineoplastic immunotherapy: Secondary | ICD-10-CM

## 2012-10-19 MED ORDER — SODIUM CHLORIDE 0.9 % IV SOLN
Freq: Once | INTRAVENOUS | Status: AC
  Administered 2012-10-19: 09:00:00 via INTRAVENOUS

## 2012-10-19 MED ORDER — POTASSIUM CHLORIDE ER 10 MEQ PO TBCR: 10.0000 meq | EXTENDED_RELEASE_TABLET | Freq: Two times a day (BID) | ORAL | Status: DC

## 2012-10-19 MED ORDER — HEPARIN SOD (PORK) LOCK FLUSH 100 UNIT/ML IV SOLN
500.0000 [IU] | Freq: Once | INTRAVENOUS | Status: AC | PRN
  Administered 2012-10-19: 500 [IU]
  Filled 2012-10-19: qty 5

## 2012-10-19 MED ORDER — ONDANSETRON 8 MG/50ML IVPB (CHCC)
8.0000 mg | Freq: Once | INTRAVENOUS | Status: AC
  Administered 2012-10-19: 8 mg via INTRAVENOUS

## 2012-10-19 MED ORDER — SODIUM CHLORIDE 0.9 % IV SOLN: Freq: Once | INTRAVENOUS | Status: DC

## 2012-10-19 MED ORDER — SODIUM CHLORIDE 0.9 % IJ SOLN
10.0000 mL | INTRAMUSCULAR | Status: DC | PRN
  Administered 2012-10-19: 10 mL
  Filled 2012-10-19: qty 10

## 2012-10-19 MED ORDER — DEXTROSE 5 % IV SOLN
27.0000 mg/m2 | Freq: Once | INTRAVENOUS | Status: AC
  Administered 2012-10-19: 60 mg via INTRAVENOUS
  Filled 2012-10-19: qty 30

## 2012-10-19 NOTE — Patient Instructions (Addendum)
[facility] Discharge Instructions for Patients Receiving Chemotherapy  Today you received the following chemotherapy agents Kyprolis.  To help prevent nausea and vomiting after your treatment, we encourage you to take your nausea medication.   If you develop nausea and vomiting that is not controlled by your nausea medication, call the clinic.   BELOW ARE SYMPTOMS THAT SHOULD BE REPORTED IMMEDIATELY:  *FEVER GREATER THAN 100.5 F  *CHILLS WITH OR WITHOUT FEVER  NAUSEA AND VOMITING THAT IS NOT CONTROLLED WITH YOUR NAUSEA MEDICATION  *UNUSUAL SHORTNESS OF BREATH  *UNUSUAL BRUISING OR BLEEDING  TENDERNESS IN MOUTH AND THROAT WITH OR WITHOUT PRESENCE OF ULCERS  *URINARY PROBLEMS  *BOWEL PROBLEMS  UNUSUAL RASH Items with * indicate a potential emergency and should be followed up as soon as possible.  Feel free to call the clinic you have any questions or concerns. The clinic phone number is [phone].    

## 2012-10-19 NOTE — Telephone Encounter (Signed)
sw pt gv appt for 10/26/12 w/labs @ 9:45am and tx to follow. i made pt aware of his other appts as well. Pt is aware that MB will add his tx at a later time. i emailed MB to add tx's. Pt stated he will get a print out on 10/26/12...td

## 2012-10-26 ENCOUNTER — Other Ambulatory Visit (HOSPITAL_BASED_OUTPATIENT_CLINIC_OR_DEPARTMENT_OTHER): Payer: BC Managed Care – PPO | Admitting: Lab

## 2012-10-26 ENCOUNTER — Telehealth: Payer: Self-pay | Admitting: *Deleted

## 2012-10-26 ENCOUNTER — Ambulatory Visit (HOSPITAL_BASED_OUTPATIENT_CLINIC_OR_DEPARTMENT_OTHER): Payer: BC Managed Care – PPO

## 2012-10-26 VITALS — BP 115/62 | HR 94 | Temp 97.2°F | Resp 18

## 2012-10-26 DIAGNOSIS — C9000 Multiple myeloma not having achieved remission: Secondary | ICD-10-CM

## 2012-10-26 DIAGNOSIS — C9002 Multiple myeloma in relapse: Secondary | ICD-10-CM

## 2012-10-26 DIAGNOSIS — Z5112 Encounter for antineoplastic immunotherapy: Secondary | ICD-10-CM

## 2012-10-26 LAB — CBC WITH DIFFERENTIAL/PLATELET
BASO%: 0.5 % (ref 0.0–2.0)
EOS%: 1.3 % (ref 0.0–7.0)
MCH: 29.5 pg (ref 27.2–33.4)
MCHC: 32.9 g/dL (ref 32.0–36.0)
MCV: 89.7 fL (ref 79.3–98.0)
MONO%: 2 % (ref 0.0–14.0)
RBC: 4.47 10*6/uL (ref 4.20–5.82)
RDW: 14.7 % — ABNORMAL HIGH (ref 11.0–14.6)

## 2012-10-26 MED ORDER — SODIUM CHLORIDE 0.9 % IV SOLN
Freq: Once | INTRAVENOUS | Status: AC
  Administered 2012-10-26: 11:00:00 via INTRAVENOUS

## 2012-10-26 MED ORDER — HEPARIN SOD (PORK) LOCK FLUSH 100 UNIT/ML IV SOLN
500.0000 [IU] | Freq: Once | INTRAVENOUS | Status: AC | PRN
  Administered 2012-10-26: 500 [IU]
  Filled 2012-10-26: qty 5

## 2012-10-26 MED ORDER — HEPARIN SOD (PORK) LOCK FLUSH 100 UNIT/ML IV SOLN
500.0000 [IU] | Freq: Once | INTRAVENOUS | Status: DC | PRN
  Filled 2012-10-26: qty 5

## 2012-10-26 MED ORDER — DEXTROSE 5 % IV SOLN
27.0000 mg/m2 | Freq: Once | INTRAVENOUS | Status: AC
  Administered 2012-10-26: 60 mg via INTRAVENOUS
  Filled 2012-10-26: qty 30

## 2012-10-26 MED ORDER — SODIUM CHLORIDE 0.9 % IV SOLN: Freq: Once | INTRAVENOUS | Status: DC

## 2012-10-26 MED ORDER — SODIUM CHLORIDE 0.9 % IJ SOLN
10.0000 mL | INTRAMUSCULAR | Status: DC | PRN
  Filled 2012-10-26: qty 10

## 2012-10-26 MED ORDER — SODIUM CHLORIDE 0.9 % IJ SOLN
10.0000 mL | INTRAMUSCULAR | Status: DC | PRN
  Administered 2012-10-26: 10 mL
  Filled 2012-10-26: qty 10

## 2012-10-26 MED ORDER — ONDANSETRON 8 MG/50ML IVPB (CHCC): 8.0000 mg | Freq: Once | INTRAVENOUS | Status: DC

## 2012-10-26 MED ORDER — ONDANSETRON 8 MG/50ML IVPB (CHCC)
8.0000 mg | Freq: Once | INTRAVENOUS | Status: AC
  Administered 2012-10-26: 8 mg via INTRAVENOUS

## 2012-10-26 MED ORDER — SODIUM CHLORIDE 0.9 % IV SOLN
Freq: Once | INTRAVENOUS | Status: AC
  Administered 2012-10-26: 10:00:00 via INTRAVENOUS

## 2012-10-26 MED ORDER — CARFILZOMIB CHEMO INJECTION 60 MG: 27.0000 mg/m2 | Freq: Once | INTRAVENOUS | Status: DC

## 2012-10-26 NOTE — Patient Instructions (Addendum)
[facility] Discharge Instructions for Patients Receiving Chemotherapy  Today you received the following chemotherapy agents: kyprolis  To help prevent nausea and vomiting after your treatment, we encourage you to take your nausea medication.  Take it as often as prescribed.     If you develop nausea and vomiting that is not controlled by your nausea medication, call the clinic. If it is after clinic hours your family physician or the after hours number for the clinic or go to the Emergency Department.   BELOW ARE SYMPTOMS THAT SHOULD BE REPORTED IMMEDIATELY:  *FEVER GREATER THAN 100.5 F  *CHILLS WITH OR WITHOUT FEVER  NAUSEA AND VOMITING THAT IS NOT CONTROLLED WITH YOUR NAUSEA MEDICATION  *UNUSUAL SHORTNESS OF BREATH  *UNUSUAL BRUISING OR BLEEDING  TENDERNESS IN MOUTH AND THROAT WITH OR WITHOUT PRESENCE OF ULCERS  *URINARY PROBLEMS  *BOWEL PROBLEMS  UNUSUAL RASH Items with * indicate a potential emergency and should be followed up as soon as possible.  Feel free to call the clinic you have any questions or concerns. The clinic phone number is [phone].   I have been informed and understand all the instructions given to me. I know to contact the clinic, my physician, or go to the Emergency Department if any problems should occur. I do not have any questions at this time, but understand that I may call the clinic during office hours   should I have any questions or need assistance in obtaining follow up care.    __________________________________________  _____________  __________ Signature of Patient or Authorized Representative            Date                   Time    __________________________________________ Nurse's Signature     Dehydration, Adult Dehydration is when you lose more fluids from the body than you take in. Vital organs like the kidneys, brain, and heart cannot function without a proper amount of fluids and salt. Any loss of  fluids from the body can cause dehydration.  CAUSES   Vomiting.  Diarrhea.  Excessive sweating.  Excessive urine output.  Fever. SYMPTOMS  Mild dehydration  Thirst.  Dry lips.  Slightly dry mouth. Moderate dehydration  Very dry mouth.  Sunken eyes.  Skin does not bounce back quickly when lightly pinched and released.  Dark urine and decreased urine production.  Decreased tear production.  Headache. Severe dehydration  Very dry mouth.  Extreme thirst.  Rapid, weak pulse (more than 100 beats per minute at rest).  Cold hands and feet.  Not able to sweat in spite of heat and temperature.  Rapid breathing.  Blue lips.  Confusion and lethargy.  Difficulty being awakened.  Minimal urine production.  No tears. DIAGNOSIS  Your caregiver will diagnose dehydration based on your symptoms and your exam. Blood and urine tests will help confirm the diagnosis. The diagnostic evaluation should also identify the cause of dehydration. TREATMENT  Treatment of mild or moderate dehydration can often be done at home by increasing the amount of fluids that you drink. It is best to drink small amounts of fluid more often. Drinking too much at one time can make vomiting worse. Refer to the home care instructions below. Severe dehydration needs to be treated at the hospital where you will probably be given intravenous (IV) fluids that contain water and electrolytes. HOME CARE INSTRUCTIONS   Ask your caregiver about specific rehydration instructions.  Drink enough  fluids to keep your urine clear or pale yellow.  Drink small amounts frequently if you have nausea and vomiting.  Eat as you normally do.  Avoid:  Foods or drinks high in sugar.  Carbonated drinks.  Juice.  Extremely hot or cold fluids.  Drinks with caffeine.  Fatty, greasy foods.  Alcohol.  Tobacco.  Overeating.  Gelatin desserts.  Wash your hands well to avoid spreading bacteria and  viruses.  Only take over-the-counter or prescription medicines for pain, discomfort, or fever as directed by your caregiver.  Ask your caregiver if you should continue all prescribed and over-the-counter medicines.  Keep all follow-up appointments with your caregiver. SEEK MEDICAL CARE IF:  You have abdominal pain and it increases or stays in one area (localizes).  You have a rash, stiff neck, or severe headache.  You are irritable, sleepy, or difficult to awaken.  You are weak, dizzy, or extremely thirsty. SEEK IMMEDIATE MEDICAL CARE IF:   You are unable to keep fluids down or you get worse despite treatment.  You have frequent episodes of vomiting or diarrhea.  You have blood or green matter (bile) in your vomit.  You have blood in your stool or your stool looks black and tarry.  You have not urinated in 6 to 8 hours, or you have only urinated a small amount of very dark urine.  You have a fever.  You faint. MAKE SURE YOU:   Understand these instructions.  Will watch your condition.  Will get help right away if you are not doing well or get worse. Document Released: 02/28/2005 Document Revised: 05/23/2011 Document Reviewed: 10/18/2010 [facility] Patient Information 2014 [place], [place].

## 2012-10-26 NOTE — Telephone Encounter (Signed)
Per patient request I have moved appts from morning to the afternoon

## 2012-11-01 ENCOUNTER — Other Ambulatory Visit (HOSPITAL_BASED_OUTPATIENT_CLINIC_OR_DEPARTMENT_OTHER): Payer: BC Managed Care – PPO | Admitting: Lab

## 2012-11-01 ENCOUNTER — Ambulatory Visit: Payer: BC Managed Care – PPO

## 2012-11-01 ENCOUNTER — Other Ambulatory Visit: Payer: BC Managed Care – PPO | Admitting: Lab

## 2012-11-01 ENCOUNTER — Ambulatory Visit (HOSPITAL_BASED_OUTPATIENT_CLINIC_OR_DEPARTMENT_OTHER): Payer: BC Managed Care – PPO

## 2012-11-01 VITALS — BP 140/83 | HR 114 | Temp 98.2°F

## 2012-11-01 DIAGNOSIS — C9002 Multiple myeloma in relapse: Secondary | ICD-10-CM

## 2012-11-01 DIAGNOSIS — C9000 Multiple myeloma not having achieved remission: Secondary | ICD-10-CM

## 2012-11-01 DIAGNOSIS — Z5112 Encounter for antineoplastic immunotherapy: Secondary | ICD-10-CM

## 2012-11-01 LAB — CBC WITH DIFFERENTIAL/PLATELET
Basophils Absolute: 0.1 10*3/uL (ref 0.0–0.1)
HCT: 39.6 % (ref 38.4–49.9)
HGB: 13.4 g/dL (ref 13.0–17.1)
MONO#: 0 10*3/uL — ABNORMAL LOW (ref 0.1–0.9)
NEUT%: 90.1 % — ABNORMAL HIGH (ref 39.0–75.0)
WBC: 8.3 10*3/uL (ref 4.0–10.3)
lymph#: 0.7 10*3/uL — ABNORMAL LOW (ref 0.9–3.3)

## 2012-11-01 LAB — BASIC METABOLIC PANEL (CC13)
BUN: 14.6 mg/dL (ref 7.0–26.0)
CO2: 20 mEq/L — ABNORMAL LOW (ref 22–29)
Chloride: 106 mEq/L (ref 98–109)
Creatinine: 1.2 mg/dL (ref 0.7–1.3)
Glucose: 188 mg/dl — ABNORMAL HIGH (ref 70–140)

## 2012-11-01 MED ORDER — HEPARIN SOD (PORK) LOCK FLUSH 100 UNIT/ML IV SOLN
500.0000 [IU] | Freq: Once | INTRAVENOUS | Status: AC | PRN
  Administered 2012-11-01: 500 [IU]
  Filled 2012-11-01: qty 5

## 2012-11-01 MED ORDER — ONDANSETRON 8 MG/50ML IVPB (CHCC)
8.0000 mg | Freq: Once | INTRAVENOUS | Status: AC
  Administered 2012-11-01: 8 mg via INTRAVENOUS

## 2012-11-01 MED ORDER — SODIUM CHLORIDE 0.9 % IV SOLN
Freq: Once | INTRAVENOUS | Status: AC
  Administered 2012-11-01: 15:00:00 via INTRAVENOUS

## 2012-11-01 MED ORDER — DEXTROSE 5 % IV SOLN
27.0000 mg/m2 | Freq: Once | INTRAVENOUS | Status: AC
  Administered 2012-11-01: 60 mg via INTRAVENOUS
  Filled 2012-11-01: qty 30

## 2012-11-01 MED ORDER — SODIUM CHLORIDE 0.9 % IJ SOLN
10.0000 mL | INTRAMUSCULAR | Status: DC | PRN
  Administered 2012-11-01: 10 mL
  Filled 2012-11-01: qty 10

## 2012-11-01 NOTE — Patient Instructions (Addendum)
[facility] Discharge Instructions for Patients Receiving Chemotherapy  Today you received the following chemotherapy agents Kyprolis To help prevent nausea and vomiting after your treatment, we encourage you to take your nausea medication as prescribed.  If you develop nausea and vomiting that is not controlled by your nausea medication, call the clinic.   BELOW ARE SYMPTOMS THAT SHOULD BE REPORTED IMMEDIATELY:  *FEVER GREATER THAN 100.5 F  *CHILLS WITH OR WITHOUT FEVER  NAUSEA AND VOMITING THAT IS NOT CONTROLLED WITH YOUR NAUSEA MEDICATION  *UNUSUAL SHORTNESS OF BREATH  *UNUSUAL BRUISING OR BLEEDING  TENDERNESS IN MOUTH AND THROAT WITH OR WITHOUT PRESENCE OF ULCERS  *URINARY PROBLEMS  *BOWEL PROBLEMS  UNUSUAL RASH Items with * indicate a potential emergency and should be followed up as soon as possible.  Feel free to call the clinic you have any questions or concerns. The clinic phone number is [phone].    

## 2012-11-02 ENCOUNTER — Ambulatory Visit: Payer: BC Managed Care – PPO

## 2012-11-02 ENCOUNTER — Ambulatory Visit (HOSPITAL_BASED_OUTPATIENT_CLINIC_OR_DEPARTMENT_OTHER): Payer: BC Managed Care – PPO

## 2012-11-02 VITALS — BP 120/68 | HR 73 | Temp 97.7°F

## 2012-11-02 DIAGNOSIS — C9000 Multiple myeloma not having achieved remission: Secondary | ICD-10-CM

## 2012-11-02 DIAGNOSIS — Z5112 Encounter for antineoplastic immunotherapy: Secondary | ICD-10-CM

## 2012-11-02 DIAGNOSIS — C9002 Multiple myeloma in relapse: Secondary | ICD-10-CM

## 2012-11-02 MED ORDER — SODIUM CHLORIDE 0.9 % IV SOLN
Freq: Once | INTRAVENOUS | Status: AC
  Administered 2012-11-02: 15:00:00 via INTRAVENOUS

## 2012-11-02 MED ORDER — DEXTROSE 5 % IV SOLN
27.0000 mg/m2 | Freq: Once | INTRAVENOUS | Status: AC
  Administered 2012-11-02: 60 mg via INTRAVENOUS
  Filled 2012-11-02: qty 30

## 2012-11-02 MED ORDER — HEPARIN SOD (PORK) LOCK FLUSH 100 UNIT/ML IV SOLN
500.0000 [IU] | Freq: Once | INTRAVENOUS | Status: AC | PRN
  Administered 2012-11-02: 500 [IU]
  Filled 2012-11-02: qty 5

## 2012-11-02 MED ORDER — SODIUM CHLORIDE 0.9 % IV SOLN: Freq: Once | INTRAVENOUS | Status: DC

## 2012-11-02 MED ORDER — SODIUM CHLORIDE 0.9 % IJ SOLN
10.0000 mL | INTRAMUSCULAR | Status: DC | PRN
  Administered 2012-11-02: 10 mL
  Filled 2012-11-02: qty 10

## 2012-11-02 MED ORDER — ONDANSETRON 8 MG/50ML IVPB (CHCC)
8.0000 mg | Freq: Once | INTRAVENOUS | Status: AC
  Administered 2012-11-02: 8 mg via INTRAVENOUS

## 2012-11-02 NOTE — Patient Instructions (Addendum)
[facility] Discharge Instructions for Patients Receiving Chemotherapy  Today you received the following chemotherapy agents: kyprolis  To help prevent nausea and vomiting after your treatment, we encourage you to take your nausea medication.  Take it as often as prescribed.     If you develop nausea and vomiting that is not controlled by your nausea medication, call the clinic. If it is after clinic hours your family physician or the after hours number for the clinic or go to the Emergency Department.   BELOW ARE SYMPTOMS THAT SHOULD BE REPORTED IMMEDIATELY:  *FEVER GREATER THAN 100.5 F  *CHILLS WITH OR WITHOUT FEVER  NAUSEA AND VOMITING THAT IS NOT CONTROLLED WITH YOUR NAUSEA MEDICATION  *UNUSUAL SHORTNESS OF BREATH  *UNUSUAL BRUISING OR BLEEDING  TENDERNESS IN MOUTH AND THROAT WITH OR WITHOUT PRESENCE OF ULCERS  *URINARY PROBLEMS  *BOWEL PROBLEMS  UNUSUAL RASH Items with * indicate a potential emergency and should be followed up as soon as possible.  Feel free to call the clinic you have any questions or concerns. The clinic phone number is [phone].   I have been informed and understand all the instructions given to me. I know to contact the clinic, my physician, or go to the Emergency Department if any problems should occur. I do not have any questions at this time, but understand that I may call the clinic during office hours   should I have any questions or need assistance in obtaining follow up care.    __________________________________________  _____________  __________ Signature of Patient or Authorized Representative            Date                   Time    __________________________________________ Nurse's Signature    

## 2012-11-05 ENCOUNTER — Other Ambulatory Visit: Payer: Self-pay | Admitting: Oncology

## 2012-11-05 DIAGNOSIS — C9000 Multiple myeloma not having achieved remission: Secondary | ICD-10-CM

## 2012-11-06 ENCOUNTER — Other Ambulatory Visit: Payer: Self-pay | Admitting: *Deleted

## 2012-11-06 DIAGNOSIS — C9000 Multiple myeloma not having achieved remission: Secondary | ICD-10-CM

## 2012-11-06 MED ORDER — FENTANYL 12 MCG/HR TD PT72: MEDICATED_PATCH | TRANSDERMAL | Status: DC

## 2012-11-06 NOTE — Telephone Encounter (Signed)
Patient weaning off of Duragesic patch. Currently on 53mcg/hr every 3 days. Will now extend to Duragesic patch 3mcg/hr(lowest dose) every 5 days x 2 weeks.(3patches)

## 2012-11-07 ENCOUNTER — Ambulatory Visit (HOSPITAL_COMMUNITY): Payer: BC Managed Care – PPO | Attending: Cardiology | Admitting: Radiology

## 2012-11-07 VITALS — BP 148/77 | Ht 72.0 in | Wt 215.0 lb

## 2012-11-07 DIAGNOSIS — R0602 Shortness of breath: Secondary | ICD-10-CM | POA: Insufficient documentation

## 2012-11-07 DIAGNOSIS — R079 Chest pain, unspecified: Secondary | ICD-10-CM | POA: Insufficient documentation

## 2012-11-07 DIAGNOSIS — I498 Other specified cardiac arrhythmias: Secondary | ICD-10-CM | POA: Insufficient documentation

## 2012-11-07 MED ORDER — TECHNETIUM TC 99M SESTAMIBI GENERIC - CARDIOLITE
33.0000 | Freq: Once | INTRAVENOUS | Status: AC | PRN
  Administered 2012-11-07: 33 via INTRAVENOUS

## 2012-11-07 MED ORDER — TECHNETIUM TC 99M SESTAMIBI GENERIC - CARDIOLITE
11.0000 | Freq: Once | INTRAVENOUS | Status: AC | PRN
  Administered 2012-11-07: 11 via INTRAVENOUS

## 2012-11-07 NOTE — Progress Notes (Signed)
[facility] 3 NUCLEAR MED [address] [place], [place] [address] [phone]    Cardiology Nuclear Med Study  Jaydee Conran is a 62 y.o. male     MRN : [id number]     DOB: 01/02/1951  Procedure Date: 11/07/2012  Nuclear Med Background Indication for Stress Test:  Evaluation for Ischemia History:  Multiple Myeloma Cardiac Risk Factors: n/a  Symptoms:  Chest Pain   Nuclear Pre-Procedure Caffeine/Decaff Intake:  None > 12 hrs NPO After: 7:00pm   Lungs:  clear O2 Sat: 98% on room air. IV 0.9% NS with Angio Cath:  20g  IV Site: R Antecubital x 1, tolerated well IV Started by:  Irean Hong, RN  Chest Size (in):  44 Cup Size: n/a  Height: 6' (1.829 m)  Weight:  215 lb (97.523 kg)  BMI:  Body mass index is 29.15 kg/(m^2). Tech Comments:  Held Coreg x 24 hrs    Nuclear Med Study 1 or 2 day study: 1 day  Stress Test Type:  Stress  Reading MD: Willa Rough, MD  Order Authorizing Provider:  Kristeen Miss, MD  Resting Radionuclide: Technetium 73m Sestamibi  Resting Radionuclide Dose: 11.0 mCi   Stress Radionuclide:  Technetium 58m Sestamibi  Stress Radionuclide Dose: 33.0 mCi           Stress Protocol Rest HR: 55 Stress HR: 137  Rest BP: 148/77 Stress BP: 209/73  Exercise Time (min): 6:00 METS: 7.0   Predicted Max HR: 160 bpm % Max HR: 83.75 bpm Rate Pressure Product: 21308   Dose of Adenosine (mg):  n/a Dose of Lexiscan: n/a mg  Dose of Atropine (mg): n/a Dose of Dobutamine: n/a mcg/kg/min (at max HR)  Stress Test Technologist: Milana Na, EMT-P  Nuclear Technologist:  Leonia Corona, RT-N     Rest Procedure:  Myocardial perfusion imaging was performed at rest 45 minutes following the intravenous administration of Technetium 55m Sestamibi. Rest ECG:  Sinus bradycardia with normal QRS  Stress Procedure:  The patient exercised on the treadmill utilizing the Bruce Protocol for 6:00 minutes. The patient stopped due to sob, fatigue, and denied any chest pain.   Technetium 94m Sestamibi was injected at peak exercise and myocardial perfusion imaging was performed after a brief delay. Stress ECG: No significant change from baseline ECG  QPS Raw Data Images:  Normal; no motion artifact; normal heart/lung ratio. Stress Images:  Normal homogeneous uptake in all areas of the myocardium. Rest Images:  Normal homogeneous uptake in all areas of the myocardium. Subtraction (SDS):  No evidence of ischemia. Transient Ischemic Dilatation (Normal <1.22):  n/a Lung/Heart Ratio (Normal <0.45):  0.61  Quantitative Gated Spect Images QGS EDV:  88 ml QGS ESV:  30 ml  Impression Exercise Capacity:  Fair exercise capacity. BP Response:  Hypertensive blood pressure response. Clinical Symptoms:  Shortness of breath ECG Impression:  No significant ST segment change suggestive of ischemia. Comparison with Prior Nuclear Study: No images to compare  Overall Impression:  Normal stress nuclear study. there is no definite scar or ischemia. When the patient began to exercise there was significant increase in systolic blood pressure that occurred early in stress. This persisted throughout the 6 minutes of exercise. Overall this is a low risk scan.  LV Ejection Fraction: 66%.  LV Wall Motion:  Normal Wall Motion.  Tinnie Gens Talma Aguillard,MD

## 2012-11-12 ENCOUNTER — Other Ambulatory Visit: Payer: Self-pay | Admitting: Oncology

## 2012-11-13 ENCOUNTER — Telehealth: Payer: Self-pay | Admitting: Cardiovascular Disease

## 2012-11-13 NOTE — Telephone Encounter (Signed)
Follow up ° ° °Pt states he is returning your call  °

## 2012-11-13 NOTE — Telephone Encounter (Signed)
Pt was given stress test results 

## 2012-11-14 ENCOUNTER — Other Ambulatory Visit: Payer: Self-pay | Admitting: *Deleted

## 2012-11-14 DIAGNOSIS — C9000 Multiple myeloma not having achieved remission: Secondary | ICD-10-CM

## 2012-11-15 ENCOUNTER — Telehealth: Payer: Self-pay | Admitting: *Deleted

## 2012-11-15 ENCOUNTER — Ambulatory Visit: Payer: BC Managed Care – PPO | Admitting: Nurse Practitioner

## 2012-11-15 ENCOUNTER — Other Ambulatory Visit: Payer: BC Managed Care – PPO | Admitting: Lab

## 2012-11-15 ENCOUNTER — Ambulatory Visit: Payer: BC Managed Care – PPO

## 2012-11-15 NOTE — Telephone Encounter (Signed)
Called pt to follow up on canceled appts today. He reports he had to go to work today and plans to come in 9/5 for treatment. Will review with MD, may need to reschedule so pt can be evaluated prior to treatment. Pt understands office will call with appts. He requests as late in the day as possible.

## 2012-11-16 ENCOUNTER — Ambulatory Visit: Payer: BC Managed Care – PPO

## 2012-11-16 ENCOUNTER — Telehealth: Payer: Self-pay | Admitting: Oncology

## 2012-11-16 NOTE — Telephone Encounter (Signed)
Talked to pt and gave him appt for lab, md and chemo,  advised pt to get appt calendar for month of September

## 2012-11-18 ENCOUNTER — Other Ambulatory Visit: Payer: Self-pay | Admitting: Oncology

## 2012-11-22 ENCOUNTER — Telehealth: Payer: Self-pay | Admitting: Oncology

## 2012-11-22 ENCOUNTER — Other Ambulatory Visit: Payer: BC Managed Care – PPO | Admitting: Lab

## 2012-11-22 ENCOUNTER — Telehealth: Payer: Self-pay | Admitting: *Deleted

## 2012-11-22 ENCOUNTER — Other Ambulatory Visit (HOSPITAL_BASED_OUTPATIENT_CLINIC_OR_DEPARTMENT_OTHER): Payer: BC Managed Care – PPO | Admitting: Lab

## 2012-11-22 ENCOUNTER — Ambulatory Visit (HOSPITAL_BASED_OUTPATIENT_CLINIC_OR_DEPARTMENT_OTHER): Payer: BC Managed Care – PPO | Admitting: Nurse Practitioner

## 2012-11-22 ENCOUNTER — Ambulatory Visit (HOSPITAL_BASED_OUTPATIENT_CLINIC_OR_DEPARTMENT_OTHER): Payer: BC Managed Care – PPO

## 2012-11-22 ENCOUNTER — Ambulatory Visit: Payer: BC Managed Care – PPO

## 2012-11-22 VITALS — BP 145/81 | HR 98 | Temp 96.9°F | Resp 18 | Ht 72.0 in | Wt 217.3 lb

## 2012-11-22 DIAGNOSIS — C9000 Multiple myeloma not having achieved remission: Secondary | ICD-10-CM

## 2012-11-22 DIAGNOSIS — G5790 Unspecified mononeuropathy of unspecified lower limb: Secondary | ICD-10-CM

## 2012-11-22 DIAGNOSIS — C9002 Multiple myeloma in relapse: Secondary | ICD-10-CM

## 2012-11-22 DIAGNOSIS — I10 Essential (primary) hypertension: Secondary | ICD-10-CM

## 2012-11-22 DIAGNOSIS — Z5112 Encounter for antineoplastic immunotherapy: Secondary | ICD-10-CM

## 2012-11-22 LAB — BASIC METABOLIC PANEL (CC13)
Calcium: 9.7 mg/dL (ref 8.4–10.4)
Chloride: 106 mEq/L (ref 98–109)
Creatinine: 1.1 mg/dL (ref 0.7–1.3)

## 2012-11-22 LAB — CBC WITH DIFFERENTIAL/PLATELET
BASO%: 0.4 % (ref 0.0–2.0)
EOS%: 0.2 % (ref 0.0–7.0)
HCT: 38.6 % (ref 38.4–49.9)
MCH: 30.3 pg (ref 27.2–33.4)
MCHC: 33.5 g/dL (ref 32.0–36.0)
NEUT%: 89.9 % — ABNORMAL HIGH (ref 39.0–75.0)
lymph#: 0.7 10*3/uL — ABNORMAL LOW (ref 0.9–3.3)

## 2012-11-22 MED ORDER — FENTANYL 25 MCG/HR TD PT72: 1.0000 | MEDICATED_PATCH | TRANSDERMAL | Status: DC

## 2012-11-22 MED ORDER — HEPARIN SOD (PORK) LOCK FLUSH 100 UNIT/ML IV SOLN
500.0000 [IU] | Freq: Once | INTRAVENOUS | Status: DC | PRN
  Filled 2012-11-22: qty 5

## 2012-11-22 MED ORDER — SODIUM CHLORIDE 0.9 % IJ SOLN
10.0000 mL | INTRAMUSCULAR | Status: DC | PRN
  Filled 2012-11-22: qty 10

## 2012-11-22 MED ORDER — ONDANSETRON 8 MG/NS 50 ML IVPB
INTRAVENOUS | Status: AC
  Filled 2012-11-22: qty 8

## 2012-11-22 MED ORDER — VALSARTAN 80 MG PO TABS: ORAL_TABLET | ORAL | Status: DC

## 2012-11-22 MED ORDER — SODIUM CHLORIDE 0.9 % IV SOLN
Freq: Once | INTRAVENOUS | Status: AC
  Administered 2012-11-22: 15:00:00 via INTRAVENOUS

## 2012-11-22 MED ORDER — ONDANSETRON 8 MG/50ML IVPB (CHCC)
8.0000 mg | Freq: Once | INTRAVENOUS | Status: AC
  Administered 2012-11-22: 8 mg via INTRAVENOUS

## 2012-11-22 MED ORDER — DEXTROSE 5 % IV SOLN
27.0000 mg/m2 | Freq: Once | INTRAVENOUS | Status: AC
  Administered 2012-11-22: 60 mg via INTRAVENOUS
  Filled 2012-11-22: qty 30

## 2012-11-22 NOTE — Telephone Encounter (Signed)
Gave pt appt for lab and MD for September and October, emailed Marcelino Duster regarding chemo

## 2012-11-22 NOTE — Telephone Encounter (Signed)
Per staff message and POF I have scheduled appts.  JMW  

## 2012-11-22 NOTE — Progress Notes (Signed)
OFFICE PROGRESS NOTE  Interval history:  Walter Dennis is a 62 year old man with multiple myeloma currently on active treatment with Carfilzomib/Revlimid/dexamethasone. He completed the most recent cycle beginning 10/18/2012. He was scheduled to begin the next cycle on 11/15/2012 but had to cancel the appointment due to a work commitment. He is seen today prior to beginning the next cycle.  He denies nausea/vomiting. No mouth sores. No frank diarrhea. He does note fairly consistent loose stools. No numbness or tingling in his hands. Peripheral neuropathy symptoms in the lower legs and feet have increased since weaning the Duragesic patch. He is taking more Norco. At nighttime he also has recently noted bilateral hip pain and low back pain. He would like to increase the Duragesic patch from 12 mcg to 25 mcg. Over the past month he has had intermittent nighttime leg cramps. No shortness of breath or chest pain. He reports undergoing a negative cardiac stress test. He has a good appetite. Energy level is diminished. No fevers. No urinary complaints.   Objective: Blood pressure 145/81, pulse 98, temperature 96.9 F (36.1 C), temperature source Oral, resp. rate 18, height 6' (1.829 m), weight 217 lb 4.8 oz (98.567 kg).  Oropharynx is without thrush or ulceration. No palpable cervical or supraclavicular lymph nodes. Lungs are clear. No wheezes or rales. Regular cardiac rhythm. No murmur. Port-A-Cath site is without erythema. Abdomen is soft and nontender. No organomegaly. Extremities are without edema. Calves are nontender. Motor strength 5 over 5.  Lab Results: Lab Results  Component Value Date   WBC 8.1 11/22/2012   HGB 12.9* 11/22/2012   HCT 38.6 11/22/2012   MCV 90.4 11/22/2012   PLT 260 11/22/2012    Chemistry:    Chemistry      Component Value Date/Time   NA 141 11/22/2012 1321   NA 137 09/28/2012 1050   K 4.7 11/22/2012 1321   K 4.1 09/28/2012 1050   CL 100 09/28/2012 1050   CO2 25 11/22/2012 1321    CO2 23 09/28/2012 1050   BUN 17.7 11/22/2012 1321   BUN 24* 09/28/2012 1050   CREATININE 1.1 11/22/2012 1321   CREATININE 1.16 09/28/2012 1050      Component Value Date/Time   CALCIUM 9.7 11/22/2012 1321   CALCIUM 9.6 09/28/2012 1050   ALKPHOS 49 08/16/2012 1546   AST 15 08/16/2012 1546   ALT 17 08/16/2012 1546   BILITOT 0.5 08/16/2012 1546       Studies/Results: No results found.  Medications: I have reviewed the patient's current medications.  Assessment/Plan:  1.Multiple myeloma, IgG lambda monoclonal protein  -Initial diagnosis 2008, bone marrow with a 40-50% plasma cells  -Lenalidomide 25 mg,day 1-21 and Decadron 40 mg weekly and 2009, complicated by peripheral neuropathy  -Therapy change to bortezomib IV twice weekly plus Decadron  -High-dose chemotherapy with autologous stem cell transplantation 2009 [facility]  -Bortezomib, cyclophosphamide, and Decadron December 2012 through February 2014 (discontinued secondary to pulmonary aspergillosis)  -January 2014 M spike rise to 1.1 g/dL from 0.1 g/dL in December 7829. Treatment started with Carlfilzomib, lenalidomide, and Decadron.  -treatment was restarted with Carlfilzomib, lenalidomide, and Decadron 08/16/2012.  -Restaging labs on 10/15/2012 consistent with improvement in the serum M. Protein. -Continuation of Carfilzomib/Revlimid/dexamethasone.  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 He attempted to taper the Duragesic patch. He is having increased pain in the lower legs and feet and is also having low back and hip pain. 4. Irritable bowel syndrome  5. Hypertension  6. "Chest pain "/palpitations July 2014-evaluated by cardiology, scheduled for outpatient followup. Normal stress nuclear study 11/07/2012.  Disposition-he appears stable. Plan to continue Carfilzomib/Revlimid/dexamethasone on the current schedule proceeding with a new cycle today. We will obtain restaging myeloma  labs on 12/06/2012.   He recently attempted to wean the Duragesic patch. He is having increased pain in the lower legs and feet and also having some low back and hip pain. We will increase the Duragesic patch to 25 mcg every 3 days.   He will return for a followup visit on 12/27/2012. He will contact the office in the interim with any problems.  Plan reviewed with Dr. Truett Perna.   Walter Dennis ANP/GNP-BC

## 2012-11-22 NOTE — Patient Instructions (Addendum)
[facility] Discharge Instructions for Patients Receiving Chemotherapy  Today you received the following chemotherapy agents Kyprolis To help prevent nausea and vomiting after your treatment, we encourage you to take your nausea medication as prescribed.  If you develop nausea and vomiting that is not controlled by your nausea medication, call the clinic.   BELOW ARE SYMPTOMS THAT SHOULD BE REPORTED IMMEDIATELY:  *FEVER GREATER THAN 100.5 F  *CHILLS WITH OR WITHOUT FEVER  NAUSEA AND VOMITING THAT IS NOT CONTROLLED WITH YOUR NAUSEA MEDICATION  *UNUSUAL SHORTNESS OF BREATH  *UNUSUAL BRUISING OR BLEEDING  TENDERNESS IN MOUTH AND THROAT WITH OR WITHOUT PRESENCE OF ULCERS  *URINARY PROBLEMS  *BOWEL PROBLEMS  UNUSUAL RASH Items with * indicate a potential emergency and should be followed up as soon as possible.  Feel free to call the clinic you have any questions or concerns. The clinic phone number is [phone].    

## 2012-11-22 NOTE — Telephone Encounter (Signed)
Talked to pt and he is aware of all appt for September ande October 2014

## 2012-11-23 ENCOUNTER — Other Ambulatory Visit: Payer: Self-pay | Admitting: *Deleted

## 2012-11-23 ENCOUNTER — Ambulatory Visit: Payer: BC Managed Care – PPO

## 2012-11-23 ENCOUNTER — Telehealth: Payer: Self-pay | Admitting: *Deleted

## 2012-11-23 ENCOUNTER — Other Ambulatory Visit: Payer: Self-pay | Admitting: Certified Registered Nurse Anesthetist

## 2012-11-23 ENCOUNTER — Ambulatory Visit (HOSPITAL_BASED_OUTPATIENT_CLINIC_OR_DEPARTMENT_OTHER): Payer: BC Managed Care – PPO

## 2012-11-23 VITALS — BP 121/70 | HR 78 | Temp 98.0°F | Resp 18

## 2012-11-23 DIAGNOSIS — C9002 Multiple myeloma in relapse: Secondary | ICD-10-CM

## 2012-11-23 DIAGNOSIS — Z5112 Encounter for antineoplastic immunotherapy: Secondary | ICD-10-CM

## 2012-11-23 DIAGNOSIS — C9000 Multiple myeloma not having achieved remission: Secondary | ICD-10-CM

## 2012-11-23 MED ORDER — DEXTROSE 5 % IV SOLN
27.0000 mg/m2 | Freq: Once | INTRAVENOUS | Status: AC
  Administered 2012-11-23: 60 mg via INTRAVENOUS
  Filled 2012-11-23: qty 30

## 2012-11-23 MED ORDER — FUROSEMIDE 20 MG PO TABS: 40.0000 mg | ORAL_TABLET | Freq: Two times a day (BID) | ORAL | Status: DC

## 2012-11-23 MED ORDER — ONDANSETRON 8 MG/NS 50 ML IVPB
INTRAVENOUS | Status: AC
  Filled 2012-11-23: qty 8

## 2012-11-23 MED ORDER — SODIUM CHLORIDE 0.9 % IV SOLN
Freq: Once | INTRAVENOUS | Status: AC
  Administered 2012-11-23: 15:00:00 via INTRAVENOUS

## 2012-11-23 MED ORDER — SODIUM CHLORIDE 0.9 % IJ SOLN
10.0000 mL | INTRAMUSCULAR | Status: DC | PRN
  Administered 2012-11-23: 10 mL
  Filled 2012-11-23: qty 10

## 2012-11-23 MED ORDER — HEPARIN SOD (PORK) LOCK FLUSH 100 UNIT/ML IV SOLN
500.0000 [IU] | Freq: Once | INTRAVENOUS | Status: AC | PRN
  Administered 2012-11-23: 500 [IU]
  Filled 2012-11-23: qty 5

## 2012-11-23 MED ORDER — ONDANSETRON 8 MG/50ML IVPB (CHCC)
8.0000 mg | Freq: Once | INTRAVENOUS | Status: AC
  Administered 2012-11-23: 8 mg via INTRAVENOUS

## 2012-11-23 NOTE — Telephone Encounter (Signed)
THIS REFILL REQUEST FOR REVLIMID WAS PLACED ON DR.SHERRILL'S DESK. 

## 2012-11-23 NOTE — Patient Instructions (Addendum)
[facility] Discharge Instructions for Patients Receiving Chemotherapy  Today you received the following chemotherapy agents Kyprolis To help prevent nausea and vomiting after your treatment, we encourage you to take your nausea medication as prescribed.  If you develop nausea and vomiting that is not controlled by your nausea medication, call the clinic.   BELOW ARE SYMPTOMS THAT SHOULD BE REPORTED IMMEDIATELY:  *FEVER GREATER THAN 100.5 F  *CHILLS WITH OR WITHOUT FEVER  NAUSEA AND VOMITING THAT IS NOT CONTROLLED WITH YOUR NAUSEA MEDICATION  *UNUSUAL SHORTNESS OF BREATH  *UNUSUAL BRUISING OR BLEEDING  TENDERNESS IN MOUTH AND THROAT WITH OR WITHOUT PRESENCE OF ULCERS  *URINARY PROBLEMS  *BOWEL PROBLEMS  UNUSUAL RASH Items with * indicate a potential emergency and should be followed up as soon as possible.  Feel free to call the clinic you have any questions or concerns. The clinic phone number is [phone].    

## 2012-11-28 ENCOUNTER — Ambulatory Visit: Payer: BC Managed Care – PPO | Admitting: Cardiovascular Disease

## 2012-11-29 ENCOUNTER — Ambulatory Visit: Payer: BC Managed Care – PPO

## 2012-11-29 ENCOUNTER — Ambulatory Visit (HOSPITAL_BASED_OUTPATIENT_CLINIC_OR_DEPARTMENT_OTHER): Payer: BC Managed Care – PPO

## 2012-11-29 ENCOUNTER — Other Ambulatory Visit (HOSPITAL_BASED_OUTPATIENT_CLINIC_OR_DEPARTMENT_OTHER): Payer: BC Managed Care – PPO | Admitting: Lab

## 2012-11-29 VITALS — BP 122/73 | HR 113 | Temp 98.1°F | Resp 20

## 2012-11-29 DIAGNOSIS — Z5112 Encounter for antineoplastic immunotherapy: Secondary | ICD-10-CM

## 2012-11-29 DIAGNOSIS — C9002 Multiple myeloma in relapse: Secondary | ICD-10-CM

## 2012-11-29 DIAGNOSIS — C9000 Multiple myeloma not having achieved remission: Secondary | ICD-10-CM

## 2012-11-29 LAB — COMPREHENSIVE METABOLIC PANEL (CC13)
Alkaline Phosphatase: 54 U/L (ref 40–150)
BUN: 19.1 mg/dL (ref 7.0–26.0)
CO2: 24 mEq/L (ref 22–29)
Glucose: 201 mg/dl — ABNORMAL HIGH (ref 70–140)
Total Bilirubin: 0.71 mg/dL (ref 0.20–1.20)

## 2012-11-29 LAB — CBC WITH DIFFERENTIAL/PLATELET
Basophils Absolute: 0 10*3/uL (ref 0.0–0.1)
EOS%: 0.1 % (ref 0.0–7.0)
HCT: 38 % — ABNORMAL LOW (ref 38.4–49.9)
HGB: 13 g/dL (ref 13.0–17.1)
LYMPH%: 7.5 % — ABNORMAL LOW (ref 14.0–49.0)
MCH: 30.6 pg (ref 27.2–33.4)
MCV: 89.4 fL (ref 79.3–98.0)
MONO%: 0.5 % (ref 0.0–14.0)
NEUT%: 91.6 % — ABNORMAL HIGH (ref 39.0–75.0)
Platelets: 220 10*3/uL (ref 140–400)

## 2012-11-29 MED ORDER — ONDANSETRON 8 MG/50ML IVPB (CHCC)
8.0000 mg | Freq: Once | INTRAVENOUS | Status: AC
  Administered 2012-11-29: 8 mg via INTRAVENOUS

## 2012-11-29 MED ORDER — HEPARIN SOD (PORK) LOCK FLUSH 100 UNIT/ML IV SOLN
500.0000 [IU] | Freq: Once | INTRAVENOUS | Status: AC | PRN
  Administered 2012-11-29: 500 [IU]
  Filled 2012-11-29: qty 5

## 2012-11-29 MED ORDER — DEXTROSE 5 % IV SOLN
27.0000 mg/m2 | Freq: Once | INTRAVENOUS | Status: AC
  Administered 2012-11-29: 60 mg via INTRAVENOUS
  Filled 2012-11-29: qty 30

## 2012-11-29 MED ORDER — SODIUM CHLORIDE 0.9 % IJ SOLN
10.0000 mL | INTRAMUSCULAR | Status: DC | PRN
  Administered 2012-11-29: 10 mL
  Filled 2012-11-29: qty 10

## 2012-11-29 MED ORDER — SODIUM CHLORIDE 0.9 % IV SOLN
Freq: Once | INTRAVENOUS | Status: AC
  Administered 2012-11-29: 15:00:00 via INTRAVENOUS

## 2012-11-29 MED ORDER — ONDANSETRON 8 MG/NS 50 ML IVPB
INTRAVENOUS | Status: AC
  Filled 2012-11-29: qty 8

## 2012-11-29 NOTE — Patient Instructions (Addendum)
[facility] Discharge Instructions for Patients Receiving Chemotherapy  Today you received the following chemotherapy agents Kyprolis To help prevent nausea and vomiting after your treatment, we encourage you to take your nausea medication as prescribed.  If you develop nausea and vomiting that is not controlled by your nausea medication, call the clinic.   BELOW ARE SYMPTOMS THAT SHOULD BE REPORTED IMMEDIATELY:  *FEVER GREATER THAN 100.5 F  *CHILLS WITH OR WITHOUT FEVER  NAUSEA AND VOMITING THAT IS NOT CONTROLLED WITH YOUR NAUSEA MEDICATION  *UNUSUAL SHORTNESS OF BREATH  *UNUSUAL BRUISING OR BLEEDING  TENDERNESS IN MOUTH AND THROAT WITH OR WITHOUT PRESENCE OF ULCERS  *URINARY PROBLEMS  *BOWEL PROBLEMS  UNUSUAL RASH Items with * indicate a potential emergency and should be followed up as soon as possible.  Feel free to call the clinic you have any questions or concerns. The clinic phone number is [phone].    

## 2012-11-30 ENCOUNTER — Ambulatory Visit (HOSPITAL_BASED_OUTPATIENT_CLINIC_OR_DEPARTMENT_OTHER): Payer: BC Managed Care – PPO

## 2012-11-30 ENCOUNTER — Ambulatory Visit: Payer: BC Managed Care – PPO

## 2012-11-30 VITALS — BP 130/66 | HR 80 | Temp 98.0°F | Resp 20

## 2012-11-30 DIAGNOSIS — Z5112 Encounter for antineoplastic immunotherapy: Secondary | ICD-10-CM

## 2012-11-30 DIAGNOSIS — C9000 Multiple myeloma not having achieved remission: Secondary | ICD-10-CM

## 2012-11-30 DIAGNOSIS — C9002 Multiple myeloma in relapse: Secondary | ICD-10-CM

## 2012-11-30 LAB — KAPPA/LAMBDA LIGHT CHAINS
Kappa free light chain: 0.03 mg/dL — ABNORMAL LOW (ref 0.33–1.94)
Kappa:Lambda Ratio: 0 — ABNORMAL LOW (ref 0.26–1.65)
Lambda Free Lght Chn: 7.53 mg/dL — ABNORMAL HIGH (ref 0.57–2.63)

## 2012-11-30 MED ORDER — SODIUM CHLORIDE 0.9 % IV SOLN
Freq: Once | INTRAVENOUS | Status: AC
  Administered 2012-11-30: 15:00:00 via INTRAVENOUS

## 2012-11-30 MED ORDER — SODIUM CHLORIDE 0.9 % IV SOLN: Freq: Once | INTRAVENOUS | Status: DC

## 2012-11-30 MED ORDER — ONDANSETRON 8 MG/NS 50 ML IVPB
INTRAVENOUS | Status: AC
  Filled 2012-11-30: qty 8

## 2012-11-30 MED ORDER — ONDANSETRON 8 MG/50ML IVPB (CHCC)
8.0000 mg | Freq: Once | INTRAVENOUS | Status: AC
  Administered 2012-11-30: 8 mg via INTRAVENOUS

## 2012-11-30 MED ORDER — SODIUM CHLORIDE 0.9 % IJ SOLN
10.0000 mL | INTRAMUSCULAR | Status: DC | PRN
  Administered 2012-11-30: 10 mL
  Filled 2012-11-30: qty 10

## 2012-11-30 MED ORDER — HEPARIN SOD (PORK) LOCK FLUSH 100 UNIT/ML IV SOLN
500.0000 [IU] | Freq: Once | INTRAVENOUS | Status: AC | PRN
  Administered 2012-11-30: 500 [IU]
  Filled 2012-11-30: qty 5

## 2012-11-30 MED ORDER — CARFILZOMIB CHEMO INJECTION 60 MG
27.0000 mg/m2 | Freq: Once | INTRAVENOUS | Status: AC
  Administered 2012-11-30: 60 mg via INTRAVENOUS
  Filled 2012-11-30: qty 30

## 2012-11-30 NOTE — Patient Instructions (Signed)
[facility] Discharge [facility] for Patients Receiving Chemotherapy  Today you received the following chemotherapy agents: kyprolis  To help prevent nausea and vomiting after your treatment, we encourage you to take your nausea medication.  Take it as often as prescribed.     If you develop nausea and vomiting that is not controlled by your nausea medication, call the clinic. If it is after clinic hours your family physician or the after hours number for the clinic or go to the Emergency Department.   BELOW ARE SYMPTOMS THAT SHOULD BE REPORTED IMMEDIATELY:  *FEVER GREATER THAN 100.5 F  *CHILLS WITH OR WITHOUT FEVER  NAUSEA AND VOMITING THAT IS NOT CONTROLLED WITH YOUR NAUSEA MEDICATION  *UNUSUAL SHORTNESS OF BREATH  *UNUSUAL BRUISING OR BLEEDING  TENDERNESS IN MOUTH AND THROAT WITH OR WITHOUT PRESENCE OF ULCERS  *URINARY PROBLEMS  *BOWEL PROBLEMS  UNUSUAL RASH Items with * indicate a potential emergency and should be followed up as soon as possible.  Feel free to call the clinic you have any questions or concerns. The clinic phone number is [phone].   I have been informed and understand all the instructions given to me. I know to contact the clinic, my physician, or go to the Emergency Department if any problems should occur. I do not have any questions at this time, but understand that I may call the clinic during office hours   should I have any questions or need assistance in obtaining follow up care.    __________________________________________  _____________  __________ Signature of Patient or Authorized Representative            Date                   Time    __________________________________________ Nurse's Signature    

## 2012-12-03 LAB — PROTEIN ELECTROPHORESIS, SERUM
Albumin ELP: 57.1 % (ref 55.8–66.1)
Alpha-1-Globulin: 3.9 % (ref 2.9–4.9)
Gamma Globulin: 2.8 % — ABNORMAL LOW (ref 11.1–18.8)

## 2012-12-03 LAB — IGG: IgG (Immunoglobin G), Serum: 1370 mg/dL (ref 650–1600)

## 2012-12-05 ENCOUNTER — Other Ambulatory Visit: Payer: Self-pay | Admitting: *Deleted

## 2012-12-05 DIAGNOSIS — C9000 Multiple myeloma not having achieved remission: Secondary | ICD-10-CM

## 2012-12-06 ENCOUNTER — Ambulatory Visit (HOSPITAL_BASED_OUTPATIENT_CLINIC_OR_DEPARTMENT_OTHER): Payer: BC Managed Care – PPO

## 2012-12-06 ENCOUNTER — Other Ambulatory Visit (HOSPITAL_BASED_OUTPATIENT_CLINIC_OR_DEPARTMENT_OTHER): Payer: BC Managed Care – PPO | Admitting: Lab

## 2012-12-06 VITALS — BP 120/66 | HR 86 | Temp 98.4°F | Resp 18

## 2012-12-06 DIAGNOSIS — C9002 Multiple myeloma in relapse: Secondary | ICD-10-CM

## 2012-12-06 DIAGNOSIS — Z5112 Encounter for antineoplastic immunotherapy: Secondary | ICD-10-CM

## 2012-12-06 DIAGNOSIS — C9000 Multiple myeloma not having achieved remission: Secondary | ICD-10-CM

## 2012-12-06 LAB — CBC WITH DIFFERENTIAL/PLATELET
Basophils Absolute: 0 10*3/uL (ref 0.0–0.1)
EOS%: 1.9 % (ref 0.0–7.0)
Eosinophils Absolute: 0.1 10*3/uL (ref 0.0–0.5)
LYMPH%: 10.5 % — ABNORMAL LOW (ref 14.0–49.0)
MCH: 30.8 pg (ref 27.2–33.4)
MCV: 89.7 fL (ref 79.3–98.0)
MONO%: 3.1 % (ref 0.0–14.0)
NEUT#: 6 10*3/uL (ref 1.5–6.5)
Platelets: 251 10*3/uL (ref 140–400)
RBC: 4.2 10*6/uL (ref 4.20–5.82)
RDW: 15 % — ABNORMAL HIGH (ref 11.0–14.6)
lymph#: 0.8 10*3/uL — ABNORMAL LOW (ref 0.9–3.3)

## 2012-12-06 MED ORDER — SODIUM CHLORIDE 0.9 % IV SOLN
Freq: Once | INTRAVENOUS | Status: AC
  Administered 2012-12-06: 16:00:00 via INTRAVENOUS

## 2012-12-06 MED ORDER — FENTANYL 25 MCG/HR TD PT72: 1.0000 | MEDICATED_PATCH | TRANSDERMAL | Status: DC

## 2012-12-06 MED ORDER — SODIUM CHLORIDE 0.9 % IJ SOLN
10.0000 mL | INTRAMUSCULAR | Status: DC | PRN
  Administered 2012-12-06: 10 mL
  Filled 2012-12-06: qty 10

## 2012-12-06 MED ORDER — HEPARIN SOD (PORK) LOCK FLUSH 100 UNIT/ML IV SOLN
500.0000 [IU] | Freq: Once | INTRAVENOUS | Status: AC | PRN
  Administered 2012-12-06: 500 [IU]
  Filled 2012-12-06: qty 5

## 2012-12-06 MED ORDER — DEXTROSE 5 % IV SOLN
27.0000 mg/m2 | Freq: Once | INTRAVENOUS | Status: AC
  Administered 2012-12-06: 60 mg via INTRAVENOUS
  Filled 2012-12-06: qty 30

## 2012-12-06 MED ORDER — ONDANSETRON 8 MG/50ML IVPB (CHCC)
8.0000 mg | Freq: Once | INTRAVENOUS | Status: AC
  Administered 2012-12-06: 8 mg via INTRAVENOUS

## 2012-12-06 NOTE — Progress Notes (Signed)
Patient reports he has a family emergency and can't come in tomorrow for Day 2 of this cycle.  Also reports he placed his last fentanyl patch today and needs a refill.  Collaborative nurse notified and will notify provider.  [name] notified by collaborative nurse of Dr. Kalman Drape verbal orders to cancel tomorrow's treatment.

## 2012-12-06 NOTE — Progress Notes (Signed)
Discharged at 1725, ambulatory in no distress.

## 2012-12-06 NOTE — Patient Instructions (Signed)
[facility] Discharge Instructions for Patients Receiving Chemotherapy  Today you received the following chemotherapy agents Kyprolis  To help prevent nausea and vomiting after your treatment, we encourage you to take your nausea medication Other zofran 8 mg.   If you develop nausea and vomiting that is not controlled by your nausea medication, call the clinic.   BELOW ARE SYMPTOMS THAT SHOULD BE REPORTED IMMEDIATELY:  *FEVER GREATER THAN 100.5 F  *CHILLS WITH OR WITHOUT FEVER  NAUSEA AND VOMITING THAT IS NOT CONTROLLED WITH YOUR NAUSEA MEDICATION  *UNUSUAL SHORTNESS OF BREATH  *UNUSUAL BRUISING OR BLEEDING  TENDERNESS IN MOUTH AND THROAT WITH OR WITHOUT PRESENCE OF ULCERS  *URINARY PROBLEMS  *BOWEL PROBLEMS  UNUSUAL RASH Items with * indicate a potential emergency and should be followed up as soon as possible.  Feel free to call the clinic you have any questions or concerns. The clinic phone number is [phone].

## 2012-12-07 ENCOUNTER — Ambulatory Visit: Payer: BC Managed Care – PPO

## 2012-12-10 ENCOUNTER — Other Ambulatory Visit: Payer: Self-pay | Admitting: *Deleted

## 2012-12-10 DIAGNOSIS — C9000 Multiple myeloma not having achieved remission: Secondary | ICD-10-CM

## 2012-12-10 MED ORDER — LENALIDOMIDE 10 MG PO CAPS: 10.0000 mg | ORAL_CAPSULE | Freq: Every day | ORAL | Status: DC

## 2012-12-16 ENCOUNTER — Other Ambulatory Visit: Payer: Self-pay | Admitting: Oncology

## 2012-12-20 ENCOUNTER — Telehealth: Payer: Self-pay | Admitting: Oncology

## 2012-12-20 ENCOUNTER — Encounter: Payer: Self-pay | Admitting: Oncology

## 2012-12-20 ENCOUNTER — Telehealth: Payer: Self-pay | Admitting: *Deleted

## 2012-12-20 ENCOUNTER — Ambulatory Visit (HOSPITAL_BASED_OUTPATIENT_CLINIC_OR_DEPARTMENT_OTHER): Payer: BC Managed Care – PPO

## 2012-12-20 ENCOUNTER — Other Ambulatory Visit: Payer: BC Managed Care – PPO | Admitting: Lab

## 2012-12-20 ENCOUNTER — Other Ambulatory Visit (HOSPITAL_BASED_OUTPATIENT_CLINIC_OR_DEPARTMENT_OTHER): Payer: BC Managed Care – PPO | Admitting: Lab

## 2012-12-20 ENCOUNTER — Ambulatory Visit (HOSPITAL_BASED_OUTPATIENT_CLINIC_OR_DEPARTMENT_OTHER): Payer: BC Managed Care – PPO | Admitting: Oncology

## 2012-12-20 VITALS — BP 135/77 | HR 92 | Temp 96.8°F | Resp 18 | Ht 72.0 in | Wt 216.8 lb

## 2012-12-20 DIAGNOSIS — C9000 Multiple myeloma not having achieved remission: Secondary | ICD-10-CM

## 2012-12-20 DIAGNOSIS — C9002 Multiple myeloma in relapse: Secondary | ICD-10-CM

## 2012-12-20 DIAGNOSIS — I10 Essential (primary) hypertension: Secondary | ICD-10-CM

## 2012-12-20 DIAGNOSIS — Z5112 Encounter for antineoplastic immunotherapy: Secondary | ICD-10-CM

## 2012-12-20 DIAGNOSIS — G609 Hereditary and idiopathic neuropathy, unspecified: Secondary | ICD-10-CM

## 2012-12-20 LAB — CBC WITH DIFFERENTIAL/PLATELET
BASO%: 1 % (ref 0.0–2.0)
EOS%: 0.3 % (ref 0.0–7.0)
Eosinophils Absolute: 0 10*3/uL (ref 0.0–0.5)
HCT: 37.8 % — ABNORMAL LOW (ref 38.4–49.9)
HGB: 12.8 g/dL — ABNORMAL LOW (ref 13.0–17.1)
LYMPH%: 10.7 % — ABNORMAL LOW (ref 14.0–49.0)
MCH: 30.2 pg (ref 27.2–33.4)
MCHC: 33.9 g/dL (ref 32.0–36.0)
MCV: 88.9 fL (ref 79.3–98.0)
MONO%: 0.6 % (ref 0.0–14.0)
NEUT#: 5.5 10*3/uL (ref 1.5–6.5)
NEUT%: 87.4 % — ABNORMAL HIGH (ref 39.0–75.0)
RBC: 4.26 10*6/uL (ref 4.20–5.82)
WBC: 6.3 10*3/uL (ref 4.0–10.3)

## 2012-12-20 MED ORDER — SODIUM CHLORIDE 0.9 % IV SOLN
Freq: Once | INTRAVENOUS | Status: AC
  Administered 2012-12-20: 14:00:00 via INTRAVENOUS

## 2012-12-20 MED ORDER — ONDANSETRON 8 MG/50ML IVPB (CHCC)
8.0000 mg | Freq: Once | INTRAVENOUS | Status: AC
  Administered 2012-12-20: 8 mg via INTRAVENOUS

## 2012-12-20 MED ORDER — DEXTROSE 5 % IV SOLN
27.0000 mg/m2 | Freq: Once | INTRAVENOUS | Status: AC
  Administered 2012-12-20: 60 mg via INTRAVENOUS
  Filled 2012-12-20: qty 30

## 2012-12-20 MED ORDER — SODIUM CHLORIDE 0.9 % IJ SOLN
10.0000 mL | INTRAMUSCULAR | Status: DC | PRN
  Administered 2012-12-20: 10 mL
  Filled 2012-12-20: qty 10

## 2012-12-20 MED ORDER — HEPARIN SOD (PORK) LOCK FLUSH 100 UNIT/ML IV SOLN
500.0000 [IU] | Freq: Once | INTRAVENOUS | Status: AC | PRN
  Administered 2012-12-20: 500 [IU]
  Filled 2012-12-20: qty 5

## 2012-12-20 MED ORDER — ONDANSETRON 8 MG/NS 50 ML IVPB
INTRAVENOUS | Status: AC
  Filled 2012-12-20: qty 8

## 2012-12-20 MED ORDER — INFLUENZA VAC SPLIT QUAD 0.5 ML IM SUSP
0.5000 mL | INTRAMUSCULAR | Status: DC
  Filled 2012-12-20: qty 0.5

## 2012-12-20 NOTE — Telephone Encounter (Signed)
Per staff message and POF I have scheduled appts.  JMW  

## 2012-12-20 NOTE — Progress Notes (Signed)
Patient upset about large bill and [employer] advised him we coded incorrectly. I advised him I sent to billing. He said he was not interested in the co-pay asst program for Kyprolis. I will let him know no pre-cert is needed for appts.

## 2012-12-20 NOTE — Telephone Encounter (Signed)
Called pt, left appt for lab, ML and chemo for October and November 2014

## 2012-12-20 NOTE — Progress Notes (Signed)
   Walter Dennis    OFFICE PROGRESS NOTE   INTERVAL HISTORY:   He returns for scheduled followup of multiple myeloma. Walter Dennis complains of increased malaise. He is also noted increased discomfort over the ribs, hips, and legs. He was unable to decrease the Duragesic patch dose. The lower extremity numbness is unchanged. He continues to work. No further chest pain.  Objective:  Vital signs in last 24 hours:  Blood pressure 135/77, pulse 92, temperature 96.8 F (36 C), temperature source Oral, resp. rate 18, height 6' (1.829 m), weight 216 lb 12.8 oz (98.34 kg).    HEENT: No thrush or ulcer Resp: Lungs clear bilaterally Cardio: Regular rate and rhythm GI: Nontender, no hepatosplenomegaly Vascular: No leg edema  Musculoskeletal: No spine, or chest wall tenderness. Tender at the mid aspect of the right tibia. No mass.   Portacath/PICC-without erythema  Lab Results:  Lab Results  Component Value Date   WBC 6.3 12/20/2012   HGB 12.8* 12/20/2012   HCT 37.8* 12/20/2012   MCV 88.9 12/20/2012   PLT 244 12/20/2012   ANC 5.5  11/29/2012-IgG 1370, serum M spike 1.43, lambda free light chain 7.53  Medications: I have reviewed the patient's current medications.  Assessment/Plan: 1.Multiple myeloma, IgG lambda monoclonal protein  -Initial diagnosis 2008, bone marrow with a 40-50% plasma cells  -Lenalidomide 25 mg,day 1-21 and Decadron 40 mg weekly and 2009, complicated by peripheral neuropathy  -Therapy change to bortezomib IV twice weekly plus Decadron  -High-dose chemotherapy with autologous stem cell transplantation 2009 at [facility]  -Bortezomib, cyclophosphamide, and Decadron December 2012 through February 2014 (discontinued secondary to pulmonary aspergillosis)  -January 2014 M spike rise to 1.1 g/dL from 0.1 g/dL in December 0865. Treatment started with Carlfilzomib, lenalidomide, and Decadron.  -treatment was restarted with Carlfilzomib, lenalidomide, and Decadron  08/16/2012.  -Restaging labs on 10/15/2012 consistent with improvement in the serum M. Protein.  -Continuation of Carfilzomib/Revlimid/dexamethasone.  -Continued improvement in the serum M spike and IgG level on 11/29/2012 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 He has been unable to lower the Duragesic patch dose  4. Irritable bowel syndrome  5. Hypertension  6. "Chest pain "/palpitations July 2014-evaluated by cardiology, scheduled for outpatient followup. Normal stress nuclear study 11/07/2012.    Disposition:  He continues Carfilzomib/Revlimid/Decadron. He will begin another cycle today. We will followup on the IgG level and serum M spike today. He will be referred for a metastatic bone survey to evaluate the areas of increased pain.  Walter Dennis will return for an office visit in one month. He is scheduled for an influenza vaccine on 12/21/2012.   Thornton Papas, MD  12/20/2012  1:17 PM

## 2012-12-20 NOTE — Telephone Encounter (Signed)
Called pt, left message regarding lab, ML, chemo for November and October 2014

## 2012-12-20 NOTE — Patient Instructions (Signed)
[facility] Discharge Instructions for Patients Receiving Chemotherapy  Today you received the following chemotherapy agents Kyprolis To help prevent nausea and vomiting after your treatment, we encourage you to take your nausea medication as prescribed.  If you develop nausea and vomiting that is not controlled by your nausea medication, call the clinic.   BELOW ARE SYMPTOMS THAT SHOULD BE REPORTED IMMEDIATELY:  *FEVER GREATER THAN 100.5 F  *CHILLS WITH OR WITHOUT FEVER  NAUSEA AND VOMITING THAT IS NOT CONTROLLED WITH YOUR NAUSEA MEDICATION  *UNUSUAL SHORTNESS OF BREATH  *UNUSUAL BRUISING OR BLEEDING  TENDERNESS IN MOUTH AND THROAT WITH OR WITHOUT PRESENCE OF ULCERS  *URINARY PROBLEMS  *BOWEL PROBLEMS  UNUSUAL RASH Items with * indicate a potential emergency and should be followed up as soon as possible.  Feel free to call the clinic you have any questions or concerns. The clinic phone number is [phone].    

## 2012-12-21 ENCOUNTER — Ambulatory Visit (HOSPITAL_BASED_OUTPATIENT_CLINIC_OR_DEPARTMENT_OTHER): Payer: BC Managed Care – PPO

## 2012-12-21 VITALS — BP 123/68 | HR 59 | Temp 98.3°F | Resp 18

## 2012-12-21 DIAGNOSIS — Z5112 Encounter for antineoplastic immunotherapy: Secondary | ICD-10-CM

## 2012-12-21 DIAGNOSIS — C9002 Multiple myeloma in relapse: Secondary | ICD-10-CM

## 2012-12-21 DIAGNOSIS — C9000 Multiple myeloma not having achieved remission: Secondary | ICD-10-CM

## 2012-12-21 DIAGNOSIS — Z23 Encounter for immunization: Secondary | ICD-10-CM

## 2012-12-21 MED ORDER — SODIUM CHLORIDE 0.9 % IV SOLN
Freq: Once | INTRAVENOUS | Status: AC
  Administered 2012-12-21: 17:00:00 via INTRAVENOUS

## 2012-12-21 MED ORDER — ONDANSETRON 8 MG/50ML IVPB (CHCC)
8.0000 mg | Freq: Once | INTRAVENOUS | Status: AC
  Administered 2012-12-21: 8 mg via INTRAVENOUS

## 2012-12-21 MED ORDER — INFLUENZA VAC SPLIT QUAD 0.5 ML IM SUSP
0.5000 mL | Freq: Once | INTRAMUSCULAR | Status: AC
  Administered 2012-12-21: 0.5 mL via INTRAMUSCULAR

## 2012-12-21 MED ORDER — SODIUM CHLORIDE 0.9 % IV SOLN
Freq: Once | INTRAVENOUS | Status: AC
  Administered 2012-12-21: 16:00:00 via INTRAVENOUS

## 2012-12-21 MED ORDER — SODIUM CHLORIDE 0.9 % IJ SOLN
10.0000 mL | INTRAMUSCULAR | Status: DC | PRN
  Administered 2012-12-21: 10 mL
  Filled 2012-12-21: qty 10

## 2012-12-21 MED ORDER — ONDANSETRON 8 MG/NS 50 ML IVPB
INTRAVENOUS | Status: AC
  Filled 2012-12-21: qty 8

## 2012-12-21 MED ORDER — HEPARIN SOD (PORK) LOCK FLUSH 100 UNIT/ML IV SOLN
500.0000 [IU] | Freq: Once | INTRAVENOUS | Status: AC | PRN
  Administered 2012-12-21: 500 [IU]
  Filled 2012-12-21: qty 5

## 2012-12-21 MED ORDER — DEXTROSE 5 % IV SOLN
27.0000 mg/m2 | Freq: Once | INTRAVENOUS | Status: AC
  Administered 2012-12-21: 60 mg via INTRAVENOUS
  Filled 2012-12-21: qty 30

## 2012-12-21 NOTE — Patient Instructions (Signed)
[facility] Discharge Instructions for Patients Receiving Chemotherapy  Today you received the following chemotherapy agents Kyprolis.  To help prevent nausea and vomiting after your treatment, we encourage you to take your nausea medication if needed.  If you develop nausea and vomiting that is not controlled by your nausea medication, call the clinic.   BELOW ARE SYMPTOMS THAT SHOULD BE REPORTED IMMEDIATELY:  *FEVER GREATER THAN 100.5 F  *CHILLS WITH OR WITHOUT FEVER  NAUSEA AND VOMITING THAT IS NOT CONTROLLED WITH YOUR NAUSEA MEDICATION  *UNUSUAL SHORTNESS OF BREATH  *UNUSUAL BRUISING OR BLEEDING  TENDERNESS IN MOUTH AND THROAT WITH OR WITHOUT PRESENCE OF ULCERS  *URINARY PROBLEMS  *BOWEL PROBLEMS  UNUSUAL RASH Items with * indicate a potential emergency and should be followed up as soon as possible.  Feel free to call the clinic you have any questions or concerns. The clinic phone number is [phone].    

## 2012-12-25 ENCOUNTER — Other Ambulatory Visit: Payer: Self-pay | Admitting: Oncology

## 2012-12-25 DIAGNOSIS — C9000 Multiple myeloma not having achieved remission: Secondary | ICD-10-CM

## 2012-12-27 ENCOUNTER — Ambulatory Visit (HOSPITAL_COMMUNITY)
Admission: RE | Admit: 2012-12-27 | Discharge: 2012-12-27 | Disposition: A | Discharge status: Home / Self Care | Payer: BC Managed Care – PPO | Source: Ambulatory Visit | Attending: Oncology | Admitting: Oncology

## 2012-12-27 ENCOUNTER — Ambulatory Visit (HOSPITAL_BASED_OUTPATIENT_CLINIC_OR_DEPARTMENT_OTHER): Payer: BC Managed Care – PPO

## 2012-12-27 ENCOUNTER — Other Ambulatory Visit (HOSPITAL_BASED_OUTPATIENT_CLINIC_OR_DEPARTMENT_OTHER): Payer: BC Managed Care – PPO | Admitting: Lab

## 2012-12-27 ENCOUNTER — Ambulatory Visit: Payer: BC Managed Care – PPO | Admitting: Oncology

## 2012-12-27 ENCOUNTER — Other Ambulatory Visit: Payer: BC Managed Care – PPO | Admitting: Lab

## 2012-12-27 VITALS — BP 138/82 | HR 80 | Temp 97.9°F | Resp 20

## 2012-12-27 DIAGNOSIS — C9000 Multiple myeloma not having achieved remission: Secondary | ICD-10-CM | POA: Insufficient documentation

## 2012-12-27 DIAGNOSIS — C9002 Multiple myeloma in relapse: Secondary | ICD-10-CM

## 2012-12-27 DIAGNOSIS — Z5112 Encounter for antineoplastic immunotherapy: Secondary | ICD-10-CM

## 2012-12-27 DIAGNOSIS — R079 Chest pain, unspecified: Secondary | ICD-10-CM | POA: Insufficient documentation

## 2012-12-27 LAB — CBC WITH DIFFERENTIAL/PLATELET
Basophils Absolute: 0 10*3/uL (ref 0.0–0.1)
EOS%: 0.2 % (ref 0.0–7.0)
Eosinophils Absolute: 0 10*3/uL (ref 0.0–0.5)
HCT: 37.6 % — ABNORMAL LOW (ref 38.4–49.9)
HGB: 12.9 g/dL — ABNORMAL LOW (ref 13.0–17.1)
MONO#: 0.1 10*3/uL (ref 0.1–0.9)
NEUT#: 5.7 10*3/uL (ref 1.5–6.5)
NEUT%: 87.1 % — ABNORMAL HIGH (ref 39.0–75.0)
RDW: 14.7 % — ABNORMAL HIGH (ref 11.0–14.6)
WBC: 6.5 10*3/uL (ref 4.0–10.3)
lymph#: 0.7 10*3/uL — ABNORMAL LOW (ref 0.9–3.3)

## 2012-12-27 MED ORDER — SODIUM CHLORIDE 0.9 % IJ SOLN
10.0000 mL | INTRAMUSCULAR | Status: DC | PRN
  Administered 2012-12-27: 10 mL
  Filled 2012-12-27: qty 10

## 2012-12-27 MED ORDER — DEXAMETHASONE 4 MG PO TABS: 20.0000 mg | ORAL_TABLET | ORAL | Status: DC

## 2012-12-27 MED ORDER — DEXTROSE 5 % IV SOLN
27.0000 mg/m2 | Freq: Once | INTRAVENOUS | Status: AC
  Administered 2012-12-27: 60 mg via INTRAVENOUS
  Filled 2012-12-27: qty 30

## 2012-12-27 MED ORDER — ONDANSETRON 8 MG/NS 50 ML IVPB
INTRAVENOUS | Status: AC
  Filled 2012-12-27: qty 8

## 2012-12-27 MED ORDER — HEPARIN SOD (PORK) LOCK FLUSH 100 UNIT/ML IV SOLN
500.0000 [IU] | Freq: Once | INTRAVENOUS | Status: AC | PRN
  Administered 2012-12-27: 500 [IU]
  Filled 2012-12-27: qty 5

## 2012-12-27 MED ORDER — ONDANSETRON 8 MG/50ML IVPB (CHCC)
8.0000 mg | Freq: Once | INTRAVENOUS | Status: AC
  Administered 2012-12-27: 8 mg via INTRAVENOUS

## 2012-12-27 MED ORDER — SODIUM CHLORIDE 0.9 % IV SOLN
Freq: Once | INTRAVENOUS | Status: AC
  Administered 2012-12-27: 16:00:00 via INTRAVENOUS

## 2012-12-27 MED ORDER — SODIUM CHLORIDE 0.9 % IV SOLN
Freq: Once | INTRAVENOUS | Status: AC
  Administered 2012-12-27: 15:00:00 via INTRAVENOUS

## 2012-12-27 NOTE — Patient Instructions (Signed)
[facility] Discharge Instructions for Patients Receiving Chemotherapy  Today you received the following chemotherapy agent: Kyprolis  To help prevent nausea and vomiting after your treatment, we encourage you to take your nausea medication as prescribed.    If you develop nausea and vomiting that is not controlled by your nausea medication, call the clinic.   BELOW ARE SYMPTOMS THAT SHOULD BE REPORTED IMMEDIATELY:  *FEVER GREATER THAN 100.5 F  *CHILLS WITH OR WITHOUT FEVER  NAUSEA AND VOMITING THAT IS NOT CONTROLLED WITH YOUR NAUSEA MEDICATION  *UNUSUAL SHORTNESS OF BREATH  *UNUSUAL BRUISING OR BLEEDING  TENDERNESS IN MOUTH AND THROAT WITH OR WITHOUT PRESENCE OF ULCERS  *URINARY PROBLEMS  *BOWEL PROBLEMS  UNUSUAL RASH Items with * indicate a potential emergency and should be followed up as soon as possible.  Feel free to call the clinic you have any questions or concerns. The clinic phone number is [phone].    

## 2012-12-28 ENCOUNTER — Ambulatory Visit (HOSPITAL_BASED_OUTPATIENT_CLINIC_OR_DEPARTMENT_OTHER): Payer: BC Managed Care – PPO

## 2012-12-28 VITALS — BP 116/68 | HR 85 | Temp 98.1°F | Resp 18

## 2012-12-28 DIAGNOSIS — C9002 Multiple myeloma in relapse: Secondary | ICD-10-CM

## 2012-12-28 DIAGNOSIS — Z5112 Encounter for antineoplastic immunotherapy: Secondary | ICD-10-CM

## 2012-12-28 DIAGNOSIS — C9000 Multiple myeloma not having achieved remission: Secondary | ICD-10-CM

## 2012-12-28 MED ORDER — SODIUM CHLORIDE 0.9 % IJ SOLN
10.0000 mL | INTRAMUSCULAR | Status: DC | PRN
  Administered 2012-12-28: 10 mL
  Filled 2012-12-28: qty 10

## 2012-12-28 MED ORDER — SODIUM CHLORIDE 0.9 % IV SOLN
Freq: Once | INTRAVENOUS | Status: AC
  Administered 2012-12-28: 16:00:00 via INTRAVENOUS

## 2012-12-28 MED ORDER — ONDANSETRON 8 MG/50ML IVPB (CHCC)
8.0000 mg | Freq: Once | INTRAVENOUS | Status: AC
  Administered 2012-12-28: 8 mg via INTRAVENOUS

## 2012-12-28 MED ORDER — DEXTROSE 5 % IV SOLN
27.0000 mg/m2 | Freq: Once | INTRAVENOUS | Status: AC
  Administered 2012-12-28: 60 mg via INTRAVENOUS
  Filled 2012-12-28: qty 30

## 2012-12-28 MED ORDER — ONDANSETRON 8 MG/NS 50 ML IVPB
INTRAVENOUS | Status: AC
  Filled 2012-12-28: qty 8

## 2012-12-28 MED ORDER — HEPARIN SOD (PORK) LOCK FLUSH 100 UNIT/ML IV SOLN
500.0000 [IU] | Freq: Once | INTRAVENOUS | Status: AC | PRN
  Administered 2012-12-28: 500 [IU]
  Filled 2012-12-28: qty 5

## 2012-12-28 NOTE — Patient Instructions (Signed)
[facility] Discharge Instructions for Patients Receiving Chemotherapy  Today you received the following chemotherapy agent Kyprolis.  To help prevent nausea and vomiting after your treatment, we encourage you to take your nausea medication.   If you develop nausea and vomiting that is not controlled by your nausea medication, call the clinic.   BELOW ARE SYMPTOMS THAT SHOULD BE REPORTED IMMEDIATELY:  *FEVER GREATER THAN 100.5 F  *CHILLS WITH OR WITHOUT FEVER  NAUSEA AND VOMITING THAT IS NOT CONTROLLED WITH YOUR NAUSEA MEDICATION  *UNUSUAL SHORTNESS OF BREATH  *UNUSUAL BRUISING OR BLEEDING  TENDERNESS IN MOUTH AND THROAT WITH OR WITHOUT PRESENCE OF ULCERS  *URINARY PROBLEMS  *BOWEL PROBLEMS  UNUSUAL RASH Items with * indicate a potential emergency and should be followed up as soon as possible.  Feel free to call the clinic you have any questions or concerns. The clinic phone number is [phone].    

## 2012-12-31 ENCOUNTER — Other Ambulatory Visit: Payer: Self-pay | Admitting: Oncology

## 2013-01-03 ENCOUNTER — Ambulatory Visit (HOSPITAL_BASED_OUTPATIENT_CLINIC_OR_DEPARTMENT_OTHER): Payer: BC Managed Care – PPO

## 2013-01-03 ENCOUNTER — Other Ambulatory Visit: Payer: Self-pay | Admitting: *Deleted

## 2013-01-03 ENCOUNTER — Other Ambulatory Visit (HOSPITAL_BASED_OUTPATIENT_CLINIC_OR_DEPARTMENT_OTHER): Payer: BC Managed Care – PPO | Admitting: Lab

## 2013-01-03 VITALS — BP 137/73 | HR 97 | Temp 99.1°F | Resp 18

## 2013-01-03 DIAGNOSIS — C9000 Multiple myeloma not having achieved remission: Secondary | ICD-10-CM

## 2013-01-03 DIAGNOSIS — C9002 Multiple myeloma in relapse: Secondary | ICD-10-CM

## 2013-01-03 DIAGNOSIS — Z5112 Encounter for antineoplastic immunotherapy: Secondary | ICD-10-CM

## 2013-01-03 LAB — COMPREHENSIVE METABOLIC PANEL (CC13)
AST: 37 U/L — ABNORMAL HIGH (ref 5–34)
Albumin: 3.6 g/dL (ref 3.5–5.0)
Anion Gap: 12 mEq/L — ABNORMAL HIGH (ref 3–11)
BUN: 13.3 mg/dL (ref 7.0–26.0)
CO2: 22 mEq/L (ref 22–29)
Calcium: 9.5 mg/dL (ref 8.4–10.4)
Chloride: 103 mEq/L (ref 98–109)
Creatinine: 1.3 mg/dL (ref 0.7–1.3)
Glucose: 173 mg/dl — ABNORMAL HIGH (ref 70–140)
Potassium: 4.4 mEq/L (ref 3.5–5.1)
Sodium: 137 mEq/L (ref 136–145)
Total Protein: 8.1 g/dL (ref 6.4–8.3)

## 2013-01-03 LAB — CBC WITH DIFFERENTIAL/PLATELET
Basophils Absolute: 0 10*3/uL (ref 0.0–0.1)
EOS%: 0 % (ref 0.0–7.0)
Eosinophils Absolute: 0 10*3/uL (ref 0.0–0.5)
HCT: 38.4 % (ref 38.4–49.9)
HGB: 13 g/dL (ref 13.0–17.1)
MCV: 88 fL (ref 79.3–98.0)
NEUT#: 7.9 10*3/uL — ABNORMAL HIGH (ref 1.5–6.5)
NEUT%: 91.9 % — ABNORMAL HIGH (ref 39.0–75.0)
RDW: 14.7 % — ABNORMAL HIGH (ref 11.0–14.6)
lymph#: 0.6 10*3/uL — ABNORMAL LOW (ref 0.9–3.3)

## 2013-01-03 MED ORDER — DEXTROSE 5 % IV SOLN
27.0000 mg/m2 | Freq: Once | INTRAVENOUS | Status: AC
  Administered 2013-01-03: 60 mg via INTRAVENOUS
  Filled 2013-01-03: qty 30

## 2013-01-03 MED ORDER — SODIUM CHLORIDE 0.9 % IV SOLN
Freq: Once | INTRAVENOUS | Status: AC
  Administered 2013-01-03: 16:00:00 via INTRAVENOUS

## 2013-01-03 MED ORDER — SODIUM CHLORIDE 0.9 % IV SOLN
Freq: Once | INTRAVENOUS | Status: AC
  Administered 2013-01-03: 15:00:00 via INTRAVENOUS

## 2013-01-03 MED ORDER — HYDROCODONE-ACETAMINOPHEN 10-325 MG PO TABS: 1.0000 | ORAL_TABLET | Freq: Four times a day (QID) | ORAL | Status: DC | PRN

## 2013-01-03 MED ORDER — HEPARIN SOD (PORK) LOCK FLUSH 100 UNIT/ML IV SOLN
500.0000 [IU] | Freq: Once | INTRAVENOUS | Status: AC | PRN
  Administered 2013-01-03: 500 [IU]
  Filled 2013-01-03: qty 5

## 2013-01-03 MED ORDER — FENTANYL 25 MCG/HR TD PT72: 1.0000 | MEDICATED_PATCH | TRANSDERMAL | Status: DC

## 2013-01-03 MED ORDER — ONDANSETRON 8 MG/50ML IVPB (CHCC)
8.0000 mg | Freq: Once | INTRAVENOUS | Status: AC
  Administered 2013-01-03: 8 mg via INTRAVENOUS

## 2013-01-03 MED ORDER — SODIUM CHLORIDE 0.9 % IJ SOLN
10.0000 mL | INTRAMUSCULAR | Status: DC | PRN
Start: 2013-01-03 — End: 2013-01-03
  Administered 2013-01-03: 10 mL
  Filled 2013-01-03: qty 10

## 2013-01-03 MED ORDER — ONDANSETRON 8 MG/NS 50 ML IVPB
INTRAVENOUS | Status: AC
  Filled 2013-01-03: qty 8

## 2013-01-03 NOTE — Patient Instructions (Signed)
[facility] Discharge Instructions for Patients Receiving Chemotherapy  Today you received the following chemotherapy agents: kyprolis  To help prevent nausea and vomiting after your treatment, we encourage you to take your nausea medication.  Take it as often as prescribed.     If you develop nausea and vomiting that is not controlled by your nausea medication, call the clinic. If it is after clinic hours your family physician or the after hours number for the clinic or go to the Emergency Department.   BELOW ARE SYMPTOMS THAT SHOULD BE REPORTED IMMEDIATELY:  *FEVER GREATER THAN 100.5 F  *CHILLS WITH OR WITHOUT FEVER  NAUSEA AND VOMITING THAT IS NOT CONTROLLED WITH YOUR NAUSEA MEDICATION  *UNUSUAL SHORTNESS OF BREATH  *UNUSUAL BRUISING OR BLEEDING  TENDERNESS IN MOUTH AND THROAT WITH OR WITHOUT PRESENCE OF ULCERS  *URINARY PROBLEMS  *BOWEL PROBLEMS  UNUSUAL RASH Items with * indicate a potential emergency and should be followed up as soon as possible.  Feel free to call the clinic you have any questions or concerns. The clinic phone number is [phone].   I have been informed and understand all the instructions given to me. I know to contact the clinic, my physician, or go to the Emergency Department if any problems should occur. I do not have any questions at this time, but understand that I may call the clinic during office hours   should I have any questions or need assistance in obtaining follow up care.    __________________________________________  _____________  __________ Signature of Patient or Authorized Representative            Date                   Time    __________________________________________ Nurse's Signature    

## 2013-01-03 NOTE — Progress Notes (Signed)
Pt reports green nasal drainage. Requesting antibiotic. Reviewed with Dr. Truett Perna: will just need to run its course. Call for high fever. Instructed pt to go to urgent care if he notices high fever or worsening "sinus" symptoms. Pain meds refilled today.

## 2013-01-04 ENCOUNTER — Ambulatory Visit (HOSPITAL_BASED_OUTPATIENT_CLINIC_OR_DEPARTMENT_OTHER): Payer: BC Managed Care – PPO

## 2013-01-04 VITALS — BP 132/67 | HR 84 | Temp 97.6°F | Resp 18

## 2013-01-04 DIAGNOSIS — C9002 Multiple myeloma in relapse: Secondary | ICD-10-CM

## 2013-01-04 DIAGNOSIS — Z5112 Encounter for antineoplastic immunotherapy: Secondary | ICD-10-CM

## 2013-01-04 DIAGNOSIS — C9000 Multiple myeloma not having achieved remission: Secondary | ICD-10-CM

## 2013-01-04 MED ORDER — DEXTROSE 5 % IV SOLN
27.0000 mg/m2 | Freq: Once | INTRAVENOUS | Status: AC
  Administered 2013-01-04: 60 mg via INTRAVENOUS
  Filled 2013-01-04: qty 30

## 2013-01-04 MED ORDER — ONDANSETRON 8 MG/50ML IVPB (CHCC)
8.0000 mg | Freq: Once | INTRAVENOUS | Status: AC
  Administered 2013-01-04: 8 mg via INTRAVENOUS

## 2013-01-04 MED ORDER — ONDANSETRON 8 MG/NS 50 ML IVPB
INTRAVENOUS | Status: AC
  Filled 2013-01-04: qty 8

## 2013-01-04 MED ORDER — HEPARIN SOD (PORK) LOCK FLUSH 100 UNIT/ML IV SOLN
500.0000 [IU] | Freq: Once | INTRAVENOUS | Status: AC | PRN
  Administered 2013-01-04: 500 [IU]
  Filled 2013-01-04: qty 5

## 2013-01-04 MED ORDER — SODIUM CHLORIDE 0.9 % IV SOLN
Freq: Once | INTRAVENOUS | Status: AC
  Administered 2013-01-04: 16:00:00 via INTRAVENOUS

## 2013-01-04 MED ORDER — DEXAMETHASONE SODIUM PHOSPHATE 10 MG/ML IJ SOLN
INTRAMUSCULAR | Status: AC
  Filled 2013-01-04: qty 1

## 2013-01-04 MED ORDER — SODIUM CHLORIDE 0.9 % IJ SOLN
10.0000 mL | INTRAMUSCULAR | Status: DC | PRN
  Administered 2013-01-04: 10 mL
  Filled 2013-01-04: qty 10

## 2013-01-04 NOTE — Patient Instructions (Signed)
[facility] Discharge Instructions for Patients Receiving Chemotherapy  Today you received the following chemotherapy agents Kyprolis.  To help prevent nausea and vomiting after your treatment, we encourage you to take your nausea medication.   If you develop nausea and vomiting that is not controlled by your nausea medication, call the clinic.   BELOW ARE SYMPTOMS THAT SHOULD BE REPORTED IMMEDIATELY:  *FEVER GREATER THAN 100.5 F  *CHILLS WITH OR WITHOUT FEVER  NAUSEA AND VOMITING THAT IS NOT CONTROLLED WITH YOUR NAUSEA MEDICATION  *UNUSUAL SHORTNESS OF BREATH  *UNUSUAL BRUISING OR BLEEDING  TENDERNESS IN MOUTH AND THROAT WITH OR WITHOUT PRESENCE OF ULCERS  *URINARY PROBLEMS  *BOWEL PROBLEMS  UNUSUAL RASH Items with * indicate a potential emergency and should be followed up as soon as possible.  Feel free to call the clinic you have any questions or concerns. The clinic phone number is [phone].    

## 2013-01-07 LAB — KAPPA/LAMBDA LIGHT CHAINS
Kappa free light chain: 0.24 mg/dL — ABNORMAL LOW (ref 0.33–1.94)
Lambda Free Lght Chn: 10.5 mg/dL — ABNORMAL HIGH (ref 0.57–2.63)

## 2013-01-07 LAB — PROTEIN ELECTROPHORESIS, SERUM
Albumin ELP: 55.1 % — ABNORMAL LOW (ref 55.8–66.1)
Beta 2: 21 % — ABNORMAL HIGH (ref 3.2–6.5)
Total Protein, Serum Electrophoresis: 7.9 g/dL (ref 6.0–8.3)

## 2013-01-07 LAB — IGG: IgG (Immunoglobin G), Serum: 1490 mg/dL (ref 650–1600)

## 2013-01-08 ENCOUNTER — Other Ambulatory Visit: Payer: Self-pay | Admitting: *Deleted

## 2013-01-08 DIAGNOSIS — C9000 Multiple myeloma not having achieved remission: Secondary | ICD-10-CM

## 2013-01-08 MED ORDER — POTASSIUM CHLORIDE ER 10 MEQ PO TBCR: 10.0000 meq | EXTENDED_RELEASE_TABLET | Freq: Two times a day (BID) | ORAL | Status: DC

## 2013-01-09 ENCOUNTER — Encounter: Payer: Self-pay | Admitting: Oncology

## 2013-01-09 NOTE — Progress Notes (Signed)
Note from billing:: I just wanted to give you an update on this patients claims.  He had approximately $64,000.00 sitting in self pay as [employer] had denied [id number] codes from 08-21-12 through current date.    I spoke with Rocky Link over at [facility] this morning and he informed me that they are reprocessing the claims and that DOS 10-18-12, 10-19-12, 10-26-12 and 08-31-12 are finalizing and awaiting payment date.  They paid on them completely with a $30.00 copay for the patient on a couple of the dates.    If the patient happens to call or stop in about a bunch of EOB denials for the code, please let him know that on 2012-12-30 the CPT codes that were in self pay were accidentally re-sent to [facility] and they denied them again, but that they are reprocessing the claims and it can take probably another 3 weeks or so before we see the payments being made or coming for them.  If you have any questions feel free to let me know. J

## 2013-01-14 ENCOUNTER — Other Ambulatory Visit: Payer: Self-pay | Admitting: *Deleted

## 2013-01-14 NOTE — Telephone Encounter (Addendum)
THIS REFILL REQUEST FOR REVLIMID WAS GIVEN TO DR.SHERRILL'S NURSE, TANYA WHITLOCK,RN. 

## 2013-01-16 ENCOUNTER — Other Ambulatory Visit: Payer: Self-pay | Admitting: Oncology

## 2013-01-17 ENCOUNTER — Other Ambulatory Visit (HOSPITAL_BASED_OUTPATIENT_CLINIC_OR_DEPARTMENT_OTHER): Payer: BC Managed Care – PPO | Admitting: Lab

## 2013-01-17 ENCOUNTER — Ambulatory Visit (HOSPITAL_BASED_OUTPATIENT_CLINIC_OR_DEPARTMENT_OTHER): Payer: BC Managed Care – PPO | Admitting: Nurse Practitioner

## 2013-01-17 ENCOUNTER — Ambulatory Visit (HOSPITAL_BASED_OUTPATIENT_CLINIC_OR_DEPARTMENT_OTHER): Payer: BC Managed Care – PPO

## 2013-01-17 ENCOUNTER — Other Ambulatory Visit: Payer: Self-pay | Admitting: *Deleted

## 2013-01-17 ENCOUNTER — Telehealth: Payer: Self-pay | Admitting: Oncology

## 2013-01-17 VITALS — BP 133/74 | HR 79 | Temp 98.3°F | Resp 18 | Ht 72.0 in | Wt 217.4 lb

## 2013-01-17 DIAGNOSIS — C9000 Multiple myeloma not having achieved remission: Secondary | ICD-10-CM

## 2013-01-17 DIAGNOSIS — Z5112 Encounter for antineoplastic immunotherapy: Secondary | ICD-10-CM

## 2013-01-17 DIAGNOSIS — G608 Other hereditary and idiopathic neuropathies: Secondary | ICD-10-CM

## 2013-01-17 DIAGNOSIS — C9002 Multiple myeloma in relapse: Secondary | ICD-10-CM

## 2013-01-17 DIAGNOSIS — I10 Essential (primary) hypertension: Secondary | ICD-10-CM

## 2013-01-17 LAB — CBC WITH DIFFERENTIAL/PLATELET
Basophils Absolute: 0.1 10*3/uL (ref 0.0–0.1)
Eosinophils Absolute: 0 10*3/uL (ref 0.0–0.5)
HCT: 35.6 % — ABNORMAL LOW (ref 38.4–49.9)
HGB: 12.1 g/dL — ABNORMAL LOW (ref 13.0–17.1)
LYMPH%: 8.2 % — ABNORMAL LOW (ref 14.0–49.0)
MONO#: 0.1 10*3/uL (ref 0.1–0.9)
MONO%: 0.8 % (ref 0.0–14.0)
NEUT#: 6 10*3/uL (ref 1.5–6.5)
NEUT%: 89.2 % — ABNORMAL HIGH (ref 39.0–75.0)
Platelets: 321 10*3/uL (ref 140–400)
WBC: 6.8 10*3/uL (ref 4.0–10.3)

## 2013-01-17 LAB — BASIC METABOLIC PANEL (CC13)
Anion Gap: 13 mEq/L — ABNORMAL HIGH (ref 3–11)
BUN: 16.2 mg/dL (ref 7.0–26.0)
CO2: 23 mEq/L (ref 22–29)
Calcium: 9.8 mg/dL (ref 8.4–10.4)
Chloride: 104 mEq/L (ref 98–109)
Creatinine: 1.2 mg/dL (ref 0.7–1.3)
Glucose: 159 mg/dl — ABNORMAL HIGH (ref 70–140)
Sodium: 140 mEq/L (ref 136–145)

## 2013-01-17 MED ORDER — ONDANSETRON 8 MG/NS 50 ML IVPB
INTRAVENOUS | Status: AC
  Filled 2013-01-17: qty 8

## 2013-01-17 MED ORDER — SODIUM CHLORIDE 0.9 % IV SOLN
Freq: Once | INTRAVENOUS | Status: AC
  Administered 2013-01-17: 13:00:00 via INTRAVENOUS

## 2013-01-17 MED ORDER — SODIUM CHLORIDE 0.9 % IJ SOLN
10.0000 mL | INTRAMUSCULAR | Status: DC | PRN
  Administered 2013-01-17: 10 mL
  Filled 2013-01-17: qty 10

## 2013-01-17 MED ORDER — DEXTROSE 5 % IV SOLN
27.0000 mg/m2 | Freq: Once | INTRAVENOUS | Status: AC
  Administered 2013-01-17: 60 mg via INTRAVENOUS
  Filled 2013-01-17: qty 30

## 2013-01-17 MED ORDER — ONDANSETRON 8 MG/50ML IVPB (CHCC)
8.0000 mg | Freq: Once | INTRAVENOUS | Status: AC
  Administered 2013-01-17: 8 mg via INTRAVENOUS

## 2013-01-17 MED ORDER — HEPARIN SOD (PORK) LOCK FLUSH 100 UNIT/ML IV SOLN
500.0000 [IU] | Freq: Once | INTRAVENOUS | Status: AC | PRN
  Administered 2013-01-17: 500 [IU]
  Filled 2013-01-17: qty 5

## 2013-01-17 MED ORDER — LENALIDOMIDE 10 MG PO CAPS: 10.0000 mg | ORAL_CAPSULE | Freq: Every day | ORAL | Status: DC

## 2013-01-17 NOTE — Progress Notes (Signed)
1440: Discharged, ambulatory in no distress.

## 2013-01-17 NOTE — Patient Instructions (Signed)
[facility] Discharge Instructions for Patients Receiving Chemotherapy  Today you received the following chemotherapy agents Kyprolis.  To help prevent nausea and vomiting after your treatment, we encourage you to take your nausea medication as directed by MD.   If you develop nausea and vomiting that is not controlled by your nausea medication, call the clinic.   BELOW ARE SYMPTOMS THAT SHOULD BE REPORTED IMMEDIATELY:  *FEVER GREATER THAN 100.5 F  *CHILLS WITH OR WITHOUT FEVER  NAUSEA AND VOMITING THAT IS NOT CONTROLLED WITH YOUR NAUSEA MEDICATION  *UNUSUAL SHORTNESS OF BREATH  *UNUSUAL BRUISING OR BLEEDING  TENDERNESS IN MOUTH AND THROAT WITH OR WITHOUT PRESENCE OF ULCERS  *URINARY PROBLEMS  *BOWEL PROBLEMS  UNUSUAL RASH Items with * indicate a potential emergency and should be followed up as soon as possible.  Feel free to call the clinic you have any questions or concerns. The clinic phone number is [phone].

## 2013-01-17 NOTE — Telephone Encounter (Signed)
gv pt appt schedule for November and December.  °

## 2013-01-17 NOTE — Progress Notes (Signed)
OFFICE PROGRESS NOTE  Interval history:  Walter Dennis is a 62 year old man with multiple myeloma currently on active treatment with Carfilzomib/Revlimid and dexamethasone. He completed the most recent cycle beginning 12/20/2012. He is seen today prior to proceeding with the next cycle.  He denies nausea/vomiting. No mouth sores. He has had diarrhea recently. He attributes the diarrhea to a Z-Pak he completed for "sinusitis". He has stable pain. Main complaint is fatigue. He has a good appetite. Stable lower extremity numbness.   Objective: Blood pressure 133/74, pulse 79, temperature 98.3 F (36.8 C), temperature source Oral, resp. rate 18, height 6' (1.829 m), weight 217 lb 6.4 oz (98.612 kg).  Oropharynx is without thrush or ulceration. Lungs are clear. Regular cardiac rhythm. Port-A-Cath site without erythema. Abdomen is soft and nontender. No organomegaly. Extremities are without edema. Calves soft and nontender.  Lab Results: Lab Results  Component Value Date   WBC 6.8 01/17/2013   HGB 12.1* 01/17/2013   HCT 35.6* 01/17/2013   MCV 87.0 01/17/2013   PLT 321 01/17/2013    Chemistry:    Chemistry      Component Value Date/Time   NA 137 01/03/2013 1455   NA 137 09/28/2012 1050   K 4.4 01/03/2013 1455   K 4.1 09/28/2012 1050   CL 100 09/28/2012 1050   CO2 22 01/03/2013 1455   CO2 23 09/28/2012 1050   BUN 13.3 01/03/2013 1455   BUN 24* 09/28/2012 1050   CREATININE 1.3 01/03/2013 1455   CREATININE 1.16 09/28/2012 1050      Component Value Date/Time   CALCIUM 9.5 01/03/2013 1455   CALCIUM 9.6 09/28/2012 1050   ALKPHOS 66 01/03/2013 1455   ALKPHOS 49 08/16/2012 1546   AST 37* 01/03/2013 1455   AST 15 08/16/2012 1546   ALT 52 01/03/2013 1455   ALT 17 08/16/2012 1546   BILITOT 0.76 01/03/2013 1455   BILITOT 0.5 08/16/2012 1546       Studies/Results: Dg Bone Survey Met  12/27/2012   CLINICAL DATA:  Multiple myeloma. Right anterior rib pain.  EXAM: METASTATIC BONE SURVEY  COMPARISON:  None.   FINDINGS: There are few small vague lucencies in the skull which are most likely venous lakes and not lesions of myeloma. No prior studies available for comparison. Vague lucencies in the inferior pubic rami are not felt to be significant.  The remainder of the skeleton appears essentially normal except for expected degenerative changes.  IMPRESSION: No visible multiple myeloma lesions in the skeleton.   Electronically Signed   By: Geanie Cooley M.D.   On: 12/27/2012 13:41    Medications: I have reviewed the patient's current medications.  Assessment/Plan:  1.Multiple myeloma, IgG lambda monoclonal protein  -Initial diagnosis 2008, bone marrow with a 40-50% plasma cells  -Lenalidomide 25 mg,day 1-21 and Decadron 40 mg weekly and 2009, complicated by peripheral neuropathy  -Therapy change to bortezomib IV twice weekly plus Decadron  -High-dose chemotherapy with autologous stem cell transplantation 2009 at [facility]  -Bortezomib, cyclophosphamide, and Decadron December 2012 through February 2014 (discontinued secondary to pulmonary aspergillosis)  -January 2014 M spike rise to 1.1 g/dL from 0.1 g/dL in December 1610. Treatment started with Carlfilzomib, lenalidomide, and Decadron.  -treatment was restarted with Carlfilzomib, lenalidomide, and Decadron 08/16/2012.  -Restaging labs on 10/15/2012 consistent with improvement in the serum M. Protein.  -Continuation of Carfilzomib/Revlimid/dexamethasone.  -Continued improvement in the serum M spike and IgG level on 11/29/2012. -Improvement in the serum M spike, slight increase in IgG on  01/03/2013.  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 He has been unable to lower the Duragesic patch dose  4. Irritable bowel syndrome  5. Hypertension  6. "Chest pain "/palpitations July 2014-evaluated by cardiology, scheduled for outpatient followup. Normal stress nuclear study 11/07/2012.  Disposition-he  appears stable. He will begin the next cycle of Carfilzomib/Revlimid/Decadron today. He reports having a followup visit with Dr. Leeanne Deed next week.  We will see him in followup on 02/14/2013. He will contact the office in the interim with any problems.  Plan reviewed with Dr. Truett Perna.   Lonna Cobb ANP/GNP-BC

## 2013-01-18 ENCOUNTER — Ambulatory Visit (HOSPITAL_BASED_OUTPATIENT_CLINIC_OR_DEPARTMENT_OTHER): Payer: BC Managed Care – PPO

## 2013-01-18 VITALS — BP 130/66 | HR 58 | Temp 97.8°F

## 2013-01-18 DIAGNOSIS — C9000 Multiple myeloma not having achieved remission: Secondary | ICD-10-CM

## 2013-01-18 DIAGNOSIS — Z5112 Encounter for antineoplastic immunotherapy: Secondary | ICD-10-CM

## 2013-01-18 DIAGNOSIS — C9002 Multiple myeloma in relapse: Secondary | ICD-10-CM

## 2013-01-18 MED ORDER — DEXTROSE 5 % IV SOLN
27.0000 mg/m2 | Freq: Once | INTRAVENOUS | Status: AC
  Administered 2013-01-18: 60 mg via INTRAVENOUS
  Filled 2013-01-18: qty 30

## 2013-01-18 MED ORDER — SODIUM CHLORIDE 0.9 % IV SOLN
Freq: Once | INTRAVENOUS | Status: AC
  Administered 2013-01-18: 16:00:00 via INTRAVENOUS

## 2013-01-18 MED ORDER — HEPARIN SOD (PORK) LOCK FLUSH 100 UNIT/ML IV SOLN
500.0000 [IU] | Freq: Once | INTRAVENOUS | Status: AC | PRN
  Administered 2013-01-18: 500 [IU]
  Filled 2013-01-18: qty 5

## 2013-01-18 MED ORDER — ONDANSETRON 8 MG/50ML IVPB (CHCC)
8.0000 mg | Freq: Once | INTRAVENOUS | Status: AC
  Administered 2013-01-18: 8 mg via INTRAVENOUS

## 2013-01-18 MED ORDER — SODIUM CHLORIDE 0.9 % IJ SOLN
10.0000 mL | INTRAMUSCULAR | Status: DC | PRN
  Administered 2013-01-18: 10 mL
  Filled 2013-01-18: qty 10

## 2013-01-18 MED ORDER — ONDANSETRON 8 MG/NS 50 ML IVPB
INTRAVENOUS | Status: AC
  Filled 2013-01-18: qty 8

## 2013-01-18 NOTE — Patient Instructions (Addendum)
[facility] Discharge Instructions for Patients Receiving Chemotherapy  Today you received the following chemotherapy agents Kyprolis To help prevent nausea and vomiting after your treatment, we encourage you to take your nausea medication as prescribed.  If you develop nausea and vomiting that is not controlled by your nausea medication, call the clinic.   BELOW ARE SYMPTOMS THAT SHOULD BE REPORTED IMMEDIATELY:  *FEVER GREATER THAN 100.5 F  *CHILLS WITH OR WITHOUT FEVER  NAUSEA AND VOMITING THAT IS NOT CONTROLLED WITH YOUR NAUSEA MEDICATION  *UNUSUAL SHORTNESS OF BREATH  *UNUSUAL BRUISING OR BLEEDING  TENDERNESS IN MOUTH AND THROAT WITH OR WITHOUT PRESENCE OF ULCERS  *URINARY PROBLEMS  *BOWEL PROBLEMS  UNUSUAL RASH Items with * indicate a potential emergency and should be followed up as soon as possible.  Feel free to call the clinic you have any questions or concerns. The clinic phone number is [phone].    

## 2013-01-21 ENCOUNTER — Telehealth: Payer: Self-pay | Admitting: *Deleted

## 2013-01-21 LAB — KAPPA/LAMBDA LIGHT CHAINS
Kappa free light chain: 0.72 mg/dL (ref 0.33–1.94)
Kappa:Lambda Ratio: 0.04 — ABNORMAL LOW (ref 0.26–1.65)

## 2013-01-21 LAB — PROTEIN ELECTROPHORESIS, SERUM
Alpha-1-Globulin: 4.4 % (ref 2.9–4.9)
Beta 2: 20.3 % — ABNORMAL HIGH (ref 3.2–6.5)
Beta Globulin: 6.5 % (ref 4.7–7.2)
M-Spike, %: 0.86 g/dL
Total Protein, Serum Electrophoresis: 7.5 g/dL (ref 6.0–8.3)

## 2013-01-21 NOTE — Telephone Encounter (Signed)
Per patient request I have moved appts to later in the day  

## 2013-01-23 ENCOUNTER — Telehealth: Payer: Self-pay | Admitting: *Deleted

## 2013-01-23 NOTE — Telephone Encounter (Signed)
Have not been able to reach patient regarding delivery of his Revlimid. Gave them his mobile #. Will provide patient with phone # to contact pharmacy tomorrow when he is here for treatment in case he has not heard from them yet.

## 2013-01-24 ENCOUNTER — Ambulatory Visit: Payer: BC Managed Care – PPO

## 2013-01-24 ENCOUNTER — Other Ambulatory Visit: Payer: BC Managed Care – PPO | Admitting: Lab

## 2013-01-25 ENCOUNTER — Telehealth: Payer: Self-pay | Admitting: *Deleted

## 2013-01-25 ENCOUNTER — Ambulatory Visit: Payer: BC Managed Care – PPO

## 2013-01-25 NOTE — Telephone Encounter (Signed)
Out of Lasix and needs refill-pharmacy reports we have not returned their request for refill (no record of pharmacy requesting this). Saw Dr. Leeanne Deed at [facility] 11/13 and was told to stop his IV Kryprolis for 30 days and only take Revlimid. Due next cycle on 01/29/18 X 21 days. Dr. Leeanne Deed did not think he even needed to be on the Lasix any longer. Asking for Dr. Kalman Drape opinion on this. Per Dr. Truett Perna : Stay off Lasix for now and he will research notes from [facility] on the visit. Keep appointment on 02/14/13 as scheduled. Chemo appointments cancelled.

## 2013-01-30 ENCOUNTER — Telehealth: Payer: Self-pay | Admitting: *Deleted

## 2013-01-30 ENCOUNTER — Other Ambulatory Visit: Payer: Self-pay | Admitting: *Deleted

## 2013-01-30 ENCOUNTER — Telehealth: Payer: Self-pay | Admitting: Oncology

## 2013-01-30 DIAGNOSIS — C9000 Multiple myeloma not having achieved remission: Secondary | ICD-10-CM

## 2013-01-30 NOTE — Telephone Encounter (Signed)
Talked to pt and gave him appt for tomorrow 11/20th lab and chemo,emailed michelle regarding chemo for december

## 2013-01-30 NOTE — Telephone Encounter (Signed)
Call from pt reporting Dr. Leeanne Deed called him and recommended he continue with Kyprolis infusions and Revlimid. Requesting to be scheduled for chemo. Will review with Dr. Truett Perna.

## 2013-01-31 ENCOUNTER — Telehealth: Payer: Self-pay | Admitting: Oncology

## 2013-01-31 ENCOUNTER — Other Ambulatory Visit: Payer: BC Managed Care – PPO | Admitting: Lab

## 2013-01-31 ENCOUNTER — Telehealth: Payer: Self-pay | Admitting: *Deleted

## 2013-01-31 ENCOUNTER — Ambulatory Visit: Payer: BC Managed Care – PPO

## 2013-01-31 ENCOUNTER — Other Ambulatory Visit (HOSPITAL_BASED_OUTPATIENT_CLINIC_OR_DEPARTMENT_OTHER): Payer: BC Managed Care – PPO | Admitting: Lab

## 2013-01-31 ENCOUNTER — Ambulatory Visit (HOSPITAL_BASED_OUTPATIENT_CLINIC_OR_DEPARTMENT_OTHER): Payer: BC Managed Care – PPO

## 2013-01-31 ENCOUNTER — Encounter (INDEPENDENT_AMBULATORY_CARE_PROVIDER_SITE_OTHER): Payer: Self-pay

## 2013-01-31 ENCOUNTER — Other Ambulatory Visit: Payer: Self-pay | Admitting: Oncology

## 2013-01-31 VITALS — BP 139/84 | HR 92 | Temp 98.0°F | Resp 20

## 2013-01-31 DIAGNOSIS — Z5112 Encounter for antineoplastic immunotherapy: Secondary | ICD-10-CM

## 2013-01-31 DIAGNOSIS — C9000 Multiple myeloma not having achieved remission: Secondary | ICD-10-CM

## 2013-01-31 DIAGNOSIS — C9002 Multiple myeloma in relapse: Secondary | ICD-10-CM

## 2013-01-31 LAB — CBC WITH DIFFERENTIAL/PLATELET
BASO%: 0.4 % (ref 0.0–2.0)
EOS%: 0.3 % (ref 0.0–7.0)
HCT: 35.8 % — ABNORMAL LOW (ref 38.4–49.9)
MCH: 29.3 pg (ref 27.2–33.4)
MCHC: 33 g/dL (ref 32.0–36.0)
MONO#: 0.1 10*3/uL (ref 0.1–0.9)
NEUT#: 8.3 10*3/uL — ABNORMAL HIGH (ref 1.5–6.5)
NEUT%: 92 % — ABNORMAL HIGH (ref 39.0–75.0)
Platelets: 272 10*3/uL (ref 140–400)
RBC: 4.04 10*6/uL — ABNORMAL LOW (ref 4.20–5.82)
RDW: 15.8 % — ABNORMAL HIGH (ref 11.0–14.6)
WBC: 9 10*3/uL (ref 4.0–10.3)
lymph#: 0.6 10*3/uL — ABNORMAL LOW (ref 0.9–3.3)

## 2013-01-31 LAB — COMPREHENSIVE METABOLIC PANEL (CC13)
ALT: 31 U/L (ref 0–55)
AST: 20 U/L (ref 5–34)
Albumin: 3.7 g/dL (ref 3.5–5.0)
Anion Gap: 12 mEq/L — ABNORMAL HIGH (ref 3–11)
CO2: 20 mEq/L — ABNORMAL LOW (ref 22–29)
Calcium: 9.2 mg/dL (ref 8.4–10.4)
Chloride: 109 mEq/L (ref 98–109)
Creatinine: 1.1 mg/dL (ref 0.7–1.3)
Potassium: 4.4 mEq/L (ref 3.5–5.1)
Sodium: 141 mEq/L (ref 136–145)
Total Protein: 7.3 g/dL (ref 6.4–8.3)

## 2013-01-31 MED ORDER — SODIUM CHLORIDE 0.9 % IV SOLN: Freq: Once | INTRAVENOUS | Status: DC

## 2013-01-31 MED ORDER — ONDANSETRON 8 MG/50ML IVPB (CHCC)
8.0000 mg | Freq: Once | INTRAVENOUS | Status: AC
  Administered 2013-01-31: 8 mg via INTRAVENOUS

## 2013-01-31 MED ORDER — HEPARIN SOD (PORK) LOCK FLUSH 100 UNIT/ML IV SOLN
500.0000 [IU] | Freq: Once | INTRAVENOUS | Status: AC | PRN
  Administered 2013-01-31: 500 [IU]
  Filled 2013-01-31: qty 5

## 2013-01-31 MED ORDER — SODIUM CHLORIDE 0.9 % IJ SOLN
10.0000 mL | INTRAMUSCULAR | Status: DC | PRN
  Administered 2013-01-31: 10 mL
  Filled 2013-01-31: qty 10

## 2013-01-31 MED ORDER — SODIUM CHLORIDE 0.9 % IV SOLN
Freq: Once | INTRAVENOUS | Status: AC
  Administered 2013-01-31: 16:00:00 via INTRAVENOUS

## 2013-01-31 MED ORDER — DEXTROSE 5 % IV SOLN
27.0000 mg/m2 | Freq: Once | INTRAVENOUS | Status: AC
  Administered 2013-01-31: 60 mg via INTRAVENOUS
  Filled 2013-01-31: qty 30

## 2013-01-31 NOTE — Patient Instructions (Signed)
[facility] Discharge Instructions for Patients Receiving Chemotherapy  Today you received the following chemotherapy agents Kyprolis To help prevent nausea and vomiting after your treatment, we encourage you to take your nausea medication as prescribed.  If you develop nausea and vomiting that is not controlled by your nausea medication, call the clinic.   BELOW ARE SYMPTOMS THAT SHOULD BE REPORTED IMMEDIATELY:  *FEVER GREATER THAN 100.5 F  *CHILLS WITH OR WITHOUT FEVER  NAUSEA AND VOMITING THAT IS NOT CONTROLLED WITH YOUR NAUSEA MEDICATION  *UNUSUAL SHORTNESS OF BREATH  *UNUSUAL BRUISING OR BLEEDING  TENDERNESS IN MOUTH AND THROAT WITH OR WITHOUT PRESENCE OF ULCERS  *URINARY PROBLEMS  *BOWEL PROBLEMS  UNUSUAL RASH Items with * indicate a potential emergency and should be followed up as soon as possible.  Feel free to call the clinic you have any questions or concerns. The clinic phone number is [phone].    

## 2013-01-31 NOTE — Telephone Encounter (Signed)
Per staff message and POF I have scheduled appts.  JMW  

## 2013-01-31 NOTE — Telephone Encounter (Signed)
Called pt and left message regarding lab,md and chemo for December 2014

## 2013-02-01 ENCOUNTER — Ambulatory Visit (HOSPITAL_BASED_OUTPATIENT_CLINIC_OR_DEPARTMENT_OTHER): Payer: BC Managed Care – PPO

## 2013-02-01 ENCOUNTER — Other Ambulatory Visit: Payer: Self-pay | Admitting: *Deleted

## 2013-02-01 ENCOUNTER — Encounter (INDEPENDENT_AMBULATORY_CARE_PROVIDER_SITE_OTHER): Payer: Self-pay

## 2013-02-01 ENCOUNTER — Ambulatory Visit: Payer: BC Managed Care – PPO

## 2013-02-01 VITALS — BP 123/79 | HR 64 | Temp 97.2°F

## 2013-02-01 DIAGNOSIS — C9000 Multiple myeloma not having achieved remission: Secondary | ICD-10-CM

## 2013-02-01 DIAGNOSIS — C9002 Multiple myeloma in relapse: Secondary | ICD-10-CM

## 2013-02-01 DIAGNOSIS — Z5112 Encounter for antineoplastic immunotherapy: Secondary | ICD-10-CM

## 2013-02-01 MED ORDER — HEPARIN SOD (PORK) LOCK FLUSH 100 UNIT/ML IV SOLN
500.0000 [IU] | Freq: Once | INTRAVENOUS | Status: AC | PRN
  Administered 2013-02-01: 500 [IU]
  Filled 2013-02-01: qty 5

## 2013-02-01 MED ORDER — ONDANSETRON 8 MG/50ML IVPB (CHCC)
8.0000 mg | Freq: Once | INTRAVENOUS | Status: AC
  Administered 2013-02-01: 8 mg via INTRAVENOUS

## 2013-02-01 MED ORDER — SODIUM CHLORIDE 0.9 % IV SOLN
Freq: Once | INTRAVENOUS | Status: AC
  Administered 2013-02-01: 15:00:00 via INTRAVENOUS

## 2013-02-01 MED ORDER — DEXTROSE 5 % IV SOLN
27.0000 mg/m2 | Freq: Once | INTRAVENOUS | Status: AC
  Administered 2013-02-01: 60 mg via INTRAVENOUS
  Filled 2013-02-01: qty 30

## 2013-02-01 MED ORDER — SODIUM CHLORIDE 0.9 % IJ SOLN
10.0000 mL | INTRAMUSCULAR | Status: DC | PRN
  Administered 2013-02-01: 10 mL
  Filled 2013-02-01: qty 10

## 2013-02-01 MED ORDER — FUROSEMIDE 40 MG PO TABS: 40.0000 mg | ORAL_TABLET | Freq: Two times a day (BID) | ORAL | Status: DC | PRN

## 2013-02-01 MED ORDER — FENTANYL 25 MCG/HR TD PT72: 25.0000 ug | MEDICATED_PATCH | TRANSDERMAL | Status: DC

## 2013-02-01 NOTE — Telephone Encounter (Signed)
MD agrees to refill Lasix, but will discuss w/patient at next visit the need to continue at this current dose of 40 mg bid. Unsure who started him on this medication.

## 2013-02-01 NOTE — Patient Instructions (Signed)
[facility] Discharge Instructions for Patients Receiving Chemotherapy  Today you received the following chemotherapy agents kyprolis  To help prevent nausea and vomiting after your treatment, we encourage you to take your nausea medication as needed   If you develop nausea and vomiting that is not controlled by your nausea medication, call the clinic.   BELOW ARE SYMPTOMS THAT SHOULD BE REPORTED IMMEDIATELY:  *FEVER GREATER THAN 100.5 F  *CHILLS WITH OR WITHOUT FEVER  NAUSEA AND VOMITING THAT IS NOT CONTROLLED WITH YOUR NAUSEA MEDICATION  *UNUSUAL SHORTNESS OF BREATH  *UNUSUAL BRUISING OR BLEEDING  TENDERNESS IN MOUTH AND THROAT WITH OR WITHOUT PRESENCE OF ULCERS  *URINARY PROBLEMS  *BOWEL PROBLEMS  UNUSUAL RASH Items with * indicate a potential emergency and should be followed up as soon as possible.  Feel free to call the clinic you have any questions or concerns. The clinic phone number is [phone].    

## 2013-02-04 LAB — PROTEIN ELECTROPHORESIS, SERUM
Alpha-1-Globulin: 3.8 % (ref 2.9–4.9)
Alpha-2-Globulin: 8.1 % (ref 7.1–11.8)
Beta Globulin: 6.1 % (ref 4.7–7.2)
Gamma Globulin: 3.3 % — ABNORMAL LOW (ref 11.1–18.8)
M-Spike, %: 0.81 g/dL
Total Protein, Serum Electrophoresis: 7.2 g/dL (ref 6.0–8.3)

## 2013-02-04 LAB — KAPPA/LAMBDA LIGHT CHAINS
Kappa free light chain: 0.14 mg/dL — ABNORMAL LOW (ref 0.33–1.94)
Lambda Free Lght Chn: 10.1 mg/dL — ABNORMAL HIGH (ref 0.57–2.63)

## 2013-02-04 LAB — IGG: IgG (Immunoglobin G), Serum: 1450 mg/dL (ref 650–1600)

## 2013-02-14 ENCOUNTER — Telehealth: Payer: Self-pay | Admitting: *Deleted

## 2013-02-14 ENCOUNTER — Other Ambulatory Visit (HOSPITAL_BASED_OUTPATIENT_CLINIC_OR_DEPARTMENT_OTHER): Payer: BC Managed Care – PPO

## 2013-02-14 ENCOUNTER — Ambulatory Visit (HOSPITAL_BASED_OUTPATIENT_CLINIC_OR_DEPARTMENT_OTHER): Payer: BC Managed Care – PPO | Admitting: Oncology

## 2013-02-14 ENCOUNTER — Other Ambulatory Visit: Payer: Self-pay | Admitting: Oncology

## 2013-02-14 ENCOUNTER — Encounter: Payer: Self-pay | Admitting: *Deleted

## 2013-02-14 ENCOUNTER — Ambulatory Visit: Payer: BC Managed Care – PPO

## 2013-02-14 ENCOUNTER — Ambulatory Visit (HOSPITAL_BASED_OUTPATIENT_CLINIC_OR_DEPARTMENT_OTHER): Payer: BC Managed Care – PPO

## 2013-02-14 ENCOUNTER — Encounter (INDEPENDENT_AMBULATORY_CARE_PROVIDER_SITE_OTHER): Payer: Self-pay

## 2013-02-14 VITALS — BP 111/66 | HR 77 | Temp 97.2°F | Resp 20 | Ht 72.0 in | Wt 220.0 lb

## 2013-02-14 DIAGNOSIS — Z5112 Encounter for antineoplastic immunotherapy: Secondary | ICD-10-CM

## 2013-02-14 DIAGNOSIS — C9000 Multiple myeloma not having achieved remission: Secondary | ICD-10-CM

## 2013-02-14 DIAGNOSIS — C9002 Multiple myeloma in relapse: Secondary | ICD-10-CM

## 2013-02-14 DIAGNOSIS — G609 Hereditary and idiopathic neuropathy, unspecified: Secondary | ICD-10-CM

## 2013-02-14 DIAGNOSIS — I10 Essential (primary) hypertension: Secondary | ICD-10-CM

## 2013-02-14 LAB — CBC WITH DIFFERENTIAL/PLATELET
BASO%: 1.1 % (ref 0.0–2.0)
Basophils Absolute: 0.1 10*3/uL (ref 0.0–0.1)
EOS%: 1.3 % (ref 0.0–7.0)
HGB: 12.3 g/dL — ABNORMAL LOW (ref 13.0–17.1)
MCH: 29 pg (ref 27.2–33.4)
MCHC: 32.8 g/dL (ref 32.0–36.0)
NEUT#: 4.5 10*3/uL (ref 1.5–6.5)
Platelets: 232 10*3/uL (ref 140–400)
RBC: 4.24 10*6/uL (ref 4.20–5.82)
RDW: 14.8 % — ABNORMAL HIGH (ref 11.0–14.6)
lymph#: 0.7 10*3/uL — ABNORMAL LOW (ref 0.9–3.3)

## 2013-02-14 MED ORDER — HEPARIN SOD (PORK) LOCK FLUSH 100 UNIT/ML IV SOLN
500.0000 [IU] | Freq: Once | INTRAVENOUS | Status: AC | PRN
  Administered 2013-02-14: 500 [IU]
  Filled 2013-02-14: qty 5

## 2013-02-14 MED ORDER — ONDANSETRON 8 MG/50ML IVPB (CHCC)
8.0000 mg | Freq: Once | INTRAVENOUS | Status: AC
  Administered 2013-02-14: 8 mg via INTRAVENOUS

## 2013-02-14 MED ORDER — SODIUM CHLORIDE 0.9 % IJ SOLN
10.0000 mL | INTRAMUSCULAR | Status: DC | PRN
  Administered 2013-02-14: 10 mL
  Filled 2013-02-14: qty 10

## 2013-02-14 MED ORDER — DEXTROSE 5 % IV SOLN
27.0000 mg/m2 | Freq: Once | INTRAVENOUS | Status: AC
  Administered 2013-02-14: 60 mg via INTRAVENOUS
  Filled 2013-02-14: qty 30

## 2013-02-14 MED ORDER — ONDANSETRON 8 MG/NS 50 ML IVPB
INTRAVENOUS | Status: AC
  Filled 2013-02-14: qty 8

## 2013-02-14 MED ORDER — SODIUM CHLORIDE 0.9 % IV SOLN
Freq: Once | INTRAVENOUS | Status: AC
  Administered 2013-02-14: 12:00:00 via INTRAVENOUS

## 2013-02-14 NOTE — Patient Instructions (Signed)
[facility] Discharge Instructions for Patients Receiving Chemotherapy  Today you received the following chemotherapy agents Kyprolis To help prevent nausea and vomiting after your treatment, we encourage you to take your nausea medication as prescribed.  If you develop nausea and vomiting that is not controlled by your nausea medication, call the clinic.   BELOW ARE SYMPTOMS THAT SHOULD BE REPORTED IMMEDIATELY:  *FEVER GREATER THAN 100.5 F  *CHILLS WITH OR WITHOUT FEVER  NAUSEA AND VOMITING THAT IS NOT CONTROLLED WITH YOUR NAUSEA MEDICATION  *UNUSUAL SHORTNESS OF BREATH  *UNUSUAL BRUISING OR BLEEDING  TENDERNESS IN MOUTH AND THROAT WITH OR WITHOUT PRESENCE OF ULCERS  *URINARY PROBLEMS  *BOWEL PROBLEMS  UNUSUAL RASH Items with * indicate a potential emergency and should be followed up as soon as possible.  Feel free to call the clinic you have any questions or concerns. The clinic phone number is [phone].    

## 2013-02-14 NOTE — Progress Notes (Signed)
   [facility]    OFFICE PROGRESS NOTE   INTERVAL HISTORY:   He returns for scheduled followup of multiple myeloma. He continues treatment with Revlimid, Decadron, and Carfilzomib. He missed Carfilzomib last week with the Thanksgiving holiday. He began another cycle of Revlimid on 02/05/2013.  He continues to have neuropathy symptoms in the feet. He complains of pain in the legs during the weeks of treatment with Carfilzomib. He complains of intermittent cramping in the hands and fingers. He remains on a Duragesic patch. He states that he was started on Lasix when he was diagnosed with pulmonary aspergillosis in February of 2013.  He saw Dr. Leeanne Deed 01/23/2013. He recommends continuing the current treatment.  Objective:  Vital signs in last 24 hours:  Blood pressure 111/66, pulse 77, temperature 97.2 F (36.2 C), temperature source Oral, resp. rate 20, height 6' (1.829 m), weight 220 lb (99.791 kg).    HEENT: No thrush or ulcers Resp: Lungs clear bilaterally Cardio: Regular rate and rhythm GI: No hepatosplenomegaly Vascular: No leg edema Musculoskeletal: No pain with motion at the hips    Portacath/PICC-without erythema  Lab Results:  Lab Results  Component Value Date   WBC 5.5 02/14/2013   HGB 12.3* 02/14/2013   HCT 37.5* 02/14/2013   MCV 88.4 02/14/2013   PLT 232 02/14/2013   ANC 4.5  01/31/2013: Serum M spike 0.81, IgG 1450, lambda free light chains 10.1   Medications: I have reviewed the patient's current medications.  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 Treatment started with Carlfilzomib, lenalidomide, and Decadron.  -treatment was restarted with Carlfilzomib, lenalidomide, and Decadron 08/16/2012.  -Restaging labs on 10/15/2012 consistent with improvement in the serum M. Protein.  -Continuation of Carfilzomib/Revlimid/dexamethasone.  -Continued improvement in the serum M spike and IgG level on 11/29/2012.  -Improvement in the serum M spike, slight increase in IgG on 01/03/2013.  -Improvement in the serum M spike, stable IgG, stable lambda free light chains 01/31/2013 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 He has been unable to lower the Duragesic patch dose  4. Irritable bowel syndrome  5. Hypertension  6. "Chest pain "/palpitations July 2014-evaluated by cardiology, scheduled for outpatient followup. Normal stress nuclear study 11/07/2012.   Disposition:  His overall status appears unchanged. I discussed the case with Dr. Leeanne Deed last month. The myeloma markers are unchanged here. The plan is to continue the current treatment. He will begin another three-week cycle of Carfilzomib today. He will return for an office visit on 03/21/2013. He will complete the current cycle of Revlimid and then hold the next cycle until 03/21/2013. We will repeat the myeloma labs on 02/28/2013.  It is not clear why he is taking Lasix. We decided to discontinue Lasix and potassium today. He will contact us for edema.    Thornton Papas, MD  02/14/2013  11:28 AM

## 2013-02-14 NOTE — Telephone Encounter (Signed)
appts made and printed.pt is aware that tx will be added. i emailed MW to add the tx's...td

## 2013-02-14 NOTE — Telephone Encounter (Signed)
Per staff message and POF I have scheduled appts.  JMW  

## 2013-02-15 ENCOUNTER — Encounter (INDEPENDENT_AMBULATORY_CARE_PROVIDER_SITE_OTHER): Payer: Self-pay

## 2013-02-15 ENCOUNTER — Other Ambulatory Visit: Payer: BC Managed Care – PPO | Admitting: Lab

## 2013-02-15 ENCOUNTER — Ambulatory Visit: Payer: BC Managed Care – PPO

## 2013-02-15 ENCOUNTER — Ambulatory Visit (HOSPITAL_BASED_OUTPATIENT_CLINIC_OR_DEPARTMENT_OTHER): Payer: BC Managed Care – PPO

## 2013-02-15 VITALS — BP 142/73 | HR 71 | Temp 97.0°F

## 2013-02-15 DIAGNOSIS — C9000 Multiple myeloma not having achieved remission: Secondary | ICD-10-CM

## 2013-02-15 DIAGNOSIS — Z5112 Encounter for antineoplastic immunotherapy: Secondary | ICD-10-CM

## 2013-02-15 DIAGNOSIS — C9002 Multiple myeloma in relapse: Secondary | ICD-10-CM

## 2013-02-15 MED ORDER — SODIUM CHLORIDE 0.9 % IV SOLN
Freq: Once | INTRAVENOUS | Status: AC
  Administered 2013-02-15: 16:00:00 via INTRAVENOUS

## 2013-02-15 MED ORDER — ONDANSETRON 8 MG/NS 50 ML IVPB
INTRAVENOUS | Status: AC
  Filled 2013-02-15: qty 8

## 2013-02-15 MED ORDER — DEXTROSE 5 % IV SOLN
27.0000 mg/m2 | Freq: Once | INTRAVENOUS | Status: AC
  Administered 2013-02-15: 60 mg via INTRAVENOUS
  Filled 2013-02-15: qty 30

## 2013-02-15 MED ORDER — SODIUM CHLORIDE 0.9 % IJ SOLN
10.0000 mL | INTRAMUSCULAR | Status: DC | PRN
  Administered 2013-02-15: 10 mL
  Filled 2013-02-15: qty 10

## 2013-02-15 MED ORDER — ONDANSETRON 8 MG/50ML IVPB (CHCC)
8.0000 mg | Freq: Once | INTRAVENOUS | Status: AC
  Administered 2013-02-15: 8 mg via INTRAVENOUS

## 2013-02-15 MED ORDER — HEPARIN SOD (PORK) LOCK FLUSH 100 UNIT/ML IV SOLN
500.0000 [IU] | Freq: Once | INTRAVENOUS | Status: AC | PRN
  Administered 2013-02-15: 500 [IU]
  Filled 2013-02-15: qty 5

## 2013-02-15 NOTE — Patient Instructions (Addendum)
[facility] Discharge Instructions for Patients Receiving Chemotherapy  Today you received the following chemotherapy agent: carfilzomib To help prevent nausea and vomiting after your treatment, we encourage you to take your nausea medication as ordered  Zofran 8 mg every 12 hours as needed for nausea     If you develop nausea and vomiting that is not controlled by your nausea medication, call the clinic.   BELOW ARE SYMPTOMS THAT SHOULD BE REPORTED IMMEDIATELY:  *FEVER GREATER THAN 100.5 F  *CHILLS WITH OR WITHOUT FEVER  NAUSEA AND VOMITING THAT IS NOT CONTROLLED WITH YOUR NAUSEA MEDICATION  *UNUSUAL SHORTNESS OF BREATH  *UNUSUAL BRUISING OR BLEEDING  TENDERNESS IN MOUTH AND THROAT WITH OR WITHOUT PRESENCE OF ULCERS  *URINARY PROBLEMS  *BOWEL PROBLEMS  UNUSUAL RASH Items with * indicate a potential emergency and should be followed up as soon as possible.  Feel free to call the clinic should you have any questions or concerns. The clinic phone number is [phone].  It has been a pleasure to serve you today !

## 2013-02-15 NOTE — Progress Notes (Signed)
@   1658 Carfilzomib infusion completed (unable to complete infusion on MAR).

## 2013-02-16 ENCOUNTER — Other Ambulatory Visit: Payer: Self-pay | Admitting: Oncology

## 2013-02-19 ENCOUNTER — Other Ambulatory Visit: Payer: Self-pay | Admitting: Oncology

## 2013-02-19 DIAGNOSIS — C9000 Multiple myeloma not having achieved remission: Secondary | ICD-10-CM

## 2013-02-19 MED ORDER — PREGABALIN 50 MG PO CAPS: 50.0000 mg | ORAL_CAPSULE | Freq: Two times a day (BID) | ORAL | Status: DC

## 2013-02-20 ENCOUNTER — Other Ambulatory Visit: Payer: Self-pay | Admitting: *Deleted

## 2013-02-20 ENCOUNTER — Encounter: Payer: Self-pay | Admitting: *Deleted

## 2013-02-20 NOTE — Progress Notes (Signed)
Received refill request from [facility] for revlimid.  Per Dr. Kalman Drape note 02-14-13 "complete current cycle of Revlimid (filled 01-28-13) then hold next cycle till 03-21-13.  Pt. Has an office visit with Dr. Truett Perna 03-21-13.   Refill request given to Amy Horton RN @ Dr. Kalman Drape desk.

## 2013-02-21 ENCOUNTER — Other Ambulatory Visit: Payer: BC Managed Care – PPO | Admitting: Lab

## 2013-02-21 ENCOUNTER — Encounter (INDEPENDENT_AMBULATORY_CARE_PROVIDER_SITE_OTHER): Payer: Self-pay

## 2013-02-21 ENCOUNTER — Ambulatory Visit (HOSPITAL_BASED_OUTPATIENT_CLINIC_OR_DEPARTMENT_OTHER): Payer: BC Managed Care – PPO

## 2013-02-21 ENCOUNTER — Other Ambulatory Visit (HOSPITAL_BASED_OUTPATIENT_CLINIC_OR_DEPARTMENT_OTHER): Payer: BC Managed Care – PPO

## 2013-02-21 ENCOUNTER — Ambulatory Visit: Payer: BC Managed Care – PPO

## 2013-02-21 ENCOUNTER — Other Ambulatory Visit: Payer: Self-pay | Admitting: Oncology

## 2013-02-21 VITALS — BP 138/80 | HR 68 | Temp 98.2°F | Resp 20

## 2013-02-21 DIAGNOSIS — C9000 Multiple myeloma not having achieved remission: Secondary | ICD-10-CM

## 2013-02-21 DIAGNOSIS — C9002 Multiple myeloma in relapse: Secondary | ICD-10-CM

## 2013-02-21 DIAGNOSIS — Z5112 Encounter for antineoplastic immunotherapy: Secondary | ICD-10-CM

## 2013-02-21 LAB — CBC WITH DIFFERENTIAL/PLATELET
Basophils Absolute: 0.1 10*3/uL (ref 0.0–0.1)
EOS%: 0.1 % (ref 0.0–7.0)
HCT: 37.5 % — ABNORMAL LOW (ref 38.4–49.9)
HGB: 12.6 g/dL — ABNORMAL LOW (ref 13.0–17.1)
MCH: 29.9 pg (ref 27.2–33.4)
MCV: 89 fL (ref 79.3–98.0)
MONO%: 1.2 % (ref 0.0–14.0)
RBC: 4.21 10*6/uL (ref 4.20–5.82)
RDW: 16 % — ABNORMAL HIGH (ref 11.0–14.6)

## 2013-02-21 MED ORDER — SODIUM CHLORIDE 0.9 % IV SOLN
Freq: Once | INTRAVENOUS | Status: AC
  Administered 2013-02-21: 16:00:00 via INTRAVENOUS

## 2013-02-21 MED ORDER — SODIUM CHLORIDE 0.9 % IJ SOLN
10.0000 mL | INTRAMUSCULAR | Status: DC | PRN
  Filled 2013-02-21: qty 10

## 2013-02-21 MED ORDER — ONDANSETRON 8 MG/50ML IVPB (CHCC)
8.0000 mg | Freq: Once | INTRAVENOUS | Status: AC
  Administered 2013-02-21: 8 mg via INTRAVENOUS

## 2013-02-21 MED ORDER — HEPARIN SOD (PORK) LOCK FLUSH 100 UNIT/ML IV SOLN
500.0000 [IU] | Freq: Once | INTRAVENOUS | Status: DC | PRN
  Filled 2013-02-21: qty 5

## 2013-02-21 MED ORDER — DEXTROSE 5 % IV SOLN
27.0000 mg/m2 | Freq: Once | INTRAVENOUS | Status: AC
  Administered 2013-02-21: 60 mg via INTRAVENOUS
  Filled 2013-02-21: qty 30

## 2013-02-21 NOTE — Patient Instructions (Signed)
[facility] Discharge Instructions for Patients Receiving Chemotherapy  Today you received the following chemotherapy agents: kyprolis  To help prevent nausea and vomiting after your treatment, we encourage you to take your nausea medication.  Take it as often as prescribed.     If you develop nausea and vomiting that is not controlled by your nausea medication, call the clinic. If it is after clinic hours your family physician or the after hours number for the clinic or go to the Emergency Department.   BELOW ARE SYMPTOMS THAT SHOULD BE REPORTED IMMEDIATELY:  *FEVER GREATER THAN 100.5 F  *CHILLS WITH OR WITHOUT FEVER  NAUSEA AND VOMITING THAT IS NOT CONTROLLED WITH YOUR NAUSEA MEDICATION  *UNUSUAL SHORTNESS OF BREATH  *UNUSUAL BRUISING OR BLEEDING  TENDERNESS IN MOUTH AND THROAT WITH OR WITHOUT PRESENCE OF ULCERS  *URINARY PROBLEMS  *BOWEL PROBLEMS  UNUSUAL RASH Items with * indicate a potential emergency and should be followed up as soon as possible.  Feel free to call the clinic you have any questions or concerns. The clinic phone number is [phone].   I have been informed and understand all the instructions given to me. I know to contact the clinic, my physician, or go to the Emergency Department if any problems should occur. I do not have any questions at this time, but understand that I may call the clinic during office hours   should I have any questions or need assistance in obtaining follow up care.    __________________________________________  _____________  __________ Signature of Patient or Authorized Representative            Date                   Time    __________________________________________ Nurse's Signature    

## 2013-02-22 ENCOUNTER — Ambulatory Visit (HOSPITAL_BASED_OUTPATIENT_CLINIC_OR_DEPARTMENT_OTHER): Payer: BC Managed Care – PPO

## 2013-02-22 ENCOUNTER — Ambulatory Visit: Payer: BC Managed Care – PPO

## 2013-02-22 ENCOUNTER — Encounter (INDEPENDENT_AMBULATORY_CARE_PROVIDER_SITE_OTHER): Payer: Self-pay

## 2013-02-22 ENCOUNTER — Other Ambulatory Visit: Payer: Self-pay | Admitting: Oncology

## 2013-02-22 VITALS — BP 132/64 | HR 63 | Temp 97.9°F | Resp 18

## 2013-02-22 DIAGNOSIS — Z5112 Encounter for antineoplastic immunotherapy: Secondary | ICD-10-CM

## 2013-02-22 DIAGNOSIS — C9000 Multiple myeloma not having achieved remission: Secondary | ICD-10-CM

## 2013-02-22 DIAGNOSIS — C9002 Multiple myeloma in relapse: Secondary | ICD-10-CM

## 2013-02-22 MED ORDER — SODIUM CHLORIDE 0.9 % IV SOLN
Freq: Once | INTRAVENOUS | Status: AC
  Administered 2013-02-22: 16:00:00 via INTRAVENOUS

## 2013-02-22 MED ORDER — SODIUM CHLORIDE 0.9 % IV SOLN: Freq: Once | INTRAVENOUS | Status: DC

## 2013-02-22 MED ORDER — SODIUM CHLORIDE 0.9 % IJ SOLN
10.0000 mL | INTRAMUSCULAR | Status: DC | PRN
  Administered 2013-02-22: 10 mL
  Filled 2013-02-22: qty 10

## 2013-02-22 MED ORDER — ONDANSETRON 8 MG/50ML IVPB (CHCC)
8.0000 mg | Freq: Once | INTRAVENOUS | Status: AC
  Administered 2013-02-22: 8 mg via INTRAVENOUS

## 2013-02-22 MED ORDER — CARFILZOMIB CHEMO INJECTION 60 MG
27.0000 mg/m2 | Freq: Once | INTRAVENOUS | Status: AC
  Administered 2013-02-22: 60 mg via INTRAVENOUS
  Filled 2013-02-22: qty 30

## 2013-02-22 MED ORDER — ONDANSETRON 8 MG/NS 50 ML IVPB
INTRAVENOUS | Status: AC
  Filled 2013-02-22: qty 8

## 2013-02-22 MED ORDER — HEPARIN SOD (PORK) LOCK FLUSH 100 UNIT/ML IV SOLN
500.0000 [IU] | Freq: Once | INTRAVENOUS | Status: AC | PRN
  Administered 2013-02-22: 500 [IU]
  Filled 2013-02-22: qty 5

## 2013-02-22 NOTE — Patient Instructions (Signed)
[facility] Discharge Instructions for Patients Receiving Chemotherapy  Today you received the following chemotherapy agents Kyprolis  To help prevent nausea and vomiting after your treatment, we encourage you to take your nausea medication as needed   If you develop nausea and vomiting that is not controlled by your nausea medication, call the clinic.   BELOW ARE SYMPTOMS THAT SHOULD BE REPORTED IMMEDIATELY:  *FEVER GREATER THAN 100.5 F  *CHILLS WITH OR WITHOUT FEVER  NAUSEA AND VOMITING THAT IS NOT CONTROLLED WITH YOUR NAUSEA MEDICATION  *UNUSUAL SHORTNESS OF BREATH  *UNUSUAL BRUISING OR BLEEDING  TENDERNESS IN MOUTH AND THROAT WITH OR WITHOUT PRESENCE OF ULCERS  *URINARY PROBLEMS  *BOWEL PROBLEMS  UNUSUAL RASH Items with * indicate a potential emergency and should be followed up as soon as possible.  Feel free to call the clinic you have any questions or concerns. The clinic phone number is [phone].    

## 2013-02-24 ENCOUNTER — Other Ambulatory Visit: Payer: Self-pay | Admitting: Oncology

## 2013-02-28 ENCOUNTER — Other Ambulatory Visit (HOSPITAL_BASED_OUTPATIENT_CLINIC_OR_DEPARTMENT_OTHER): Payer: BC Managed Care – PPO

## 2013-02-28 ENCOUNTER — Encounter (INDEPENDENT_AMBULATORY_CARE_PROVIDER_SITE_OTHER): Payer: Self-pay

## 2013-02-28 ENCOUNTER — Ambulatory Visit (HOSPITAL_BASED_OUTPATIENT_CLINIC_OR_DEPARTMENT_OTHER): Payer: BC Managed Care – PPO

## 2013-02-28 VITALS — BP 130/75 | HR 108 | Temp 98.0°F

## 2013-02-28 DIAGNOSIS — C9000 Multiple myeloma not having achieved remission: Secondary | ICD-10-CM

## 2013-02-28 DIAGNOSIS — Z5112 Encounter for antineoplastic immunotherapy: Secondary | ICD-10-CM

## 2013-02-28 DIAGNOSIS — C9002 Multiple myeloma in relapse: Secondary | ICD-10-CM

## 2013-02-28 LAB — COMPREHENSIVE METABOLIC PANEL (CC13)
ALT: 40 U/L (ref 0–55)
AST: 26 U/L (ref 5–34)
Alkaline Phosphatase: 58 U/L (ref 40–150)
Anion Gap: 12 mEq/L — ABNORMAL HIGH (ref 3–11)
CO2: 24 mEq/L (ref 22–29)
Creatinine: 1.2 mg/dL (ref 0.7–1.3)
Sodium: 142 mEq/L (ref 136–145)
Total Bilirubin: 0.95 mg/dL (ref 0.20–1.20)
Total Protein: 7.9 g/dL (ref 6.4–8.3)

## 2013-02-28 LAB — CBC WITH DIFFERENTIAL/PLATELET
BASO%: 0.8 % (ref 0.0–2.0)
Basophils Absolute: 0 10*3/uL (ref 0.0–0.1)
EOS%: 0.1 % (ref 0.0–7.0)
HCT: 39.7 % (ref 38.4–49.9)
LYMPH%: 10.4 % — ABNORMAL LOW (ref 14.0–49.0)
MCH: 29.8 pg (ref 27.2–33.4)
MCHC: 33.2 g/dL (ref 32.0–36.0)
MONO#: 0.1 10*3/uL (ref 0.1–0.9)
MONO%: 1.5 % (ref 0.0–14.0)
NEUT#: 5 10*3/uL (ref 1.5–6.5)
NEUT%: 87.2 % — ABNORMAL HIGH (ref 39.0–75.0)
Platelets: 273 10*3/uL (ref 140–400)
RBC: 4.44 10*6/uL (ref 4.20–5.82)

## 2013-02-28 MED ORDER — ONDANSETRON 8 MG/50ML IVPB (CHCC)
8.0000 mg | Freq: Once | INTRAVENOUS | Status: AC
  Administered 2013-02-28: 8 mg via INTRAVENOUS

## 2013-02-28 MED ORDER — SODIUM CHLORIDE 0.9 % IJ SOLN
10.0000 mL | INTRAMUSCULAR | Status: DC | PRN
  Filled 2013-02-28: qty 10

## 2013-02-28 MED ORDER — DEXTROSE 5 % IV SOLN
27.0000 mg/m2 | Freq: Once | INTRAVENOUS | Status: AC
  Administered 2013-02-28: 60 mg via INTRAVENOUS
  Filled 2013-02-28: qty 30

## 2013-02-28 MED ORDER — ONDANSETRON 8 MG/NS 50 ML IVPB
INTRAVENOUS | Status: AC
  Filled 2013-02-28: qty 8

## 2013-02-28 MED ORDER — SODIUM CHLORIDE 0.9 % IV SOLN
Freq: Once | INTRAVENOUS | Status: AC
  Administered 2013-02-28: 16:00:00 via INTRAVENOUS

## 2013-02-28 MED ORDER — HEPARIN SOD (PORK) LOCK FLUSH 100 UNIT/ML IV SOLN
500.0000 [IU] | Freq: Once | INTRAVENOUS | Status: AC | PRN
  Filled 2013-02-28: qty 5

## 2013-02-28 NOTE — Patient Instructions (Signed)
[facility] Discharge Instructions for Patients Receiving Chemotherapy  Today you received the following chemotherapy agents Kyprolis To help prevent nausea and vomiting after your treatment, we encourage you to take your nausea medication as prescribed.  If you develop nausea and vomiting that is not controlled by your nausea medication, call the clinic.   BELOW ARE SYMPTOMS THAT SHOULD BE REPORTED IMMEDIATELY:  *FEVER GREATER THAN 100.5 F  *CHILLS WITH OR WITHOUT FEVER  NAUSEA AND VOMITING THAT IS NOT CONTROLLED WITH YOUR NAUSEA MEDICATION  *UNUSUAL SHORTNESS OF BREATH  *UNUSUAL BRUISING OR BLEEDING  TENDERNESS IN MOUTH AND THROAT WITH OR WITHOUT PRESENCE OF ULCERS  *URINARY PROBLEMS  *BOWEL PROBLEMS  UNUSUAL RASH Items with * indicate a potential emergency and should be followed up as soon as possible.  Feel free to call the clinic you have any questions or concerns. The clinic phone number is [phone].    

## 2013-03-01 ENCOUNTER — Other Ambulatory Visit: Payer: Self-pay | Admitting: *Deleted

## 2013-03-01 ENCOUNTER — Ambulatory Visit (HOSPITAL_BASED_OUTPATIENT_CLINIC_OR_DEPARTMENT_OTHER): Payer: BC Managed Care – PPO

## 2013-03-01 VITALS — BP 121/73 | HR 74 | Temp 98.3°F | Resp 20

## 2013-03-01 DIAGNOSIS — C9000 Multiple myeloma not having achieved remission: Secondary | ICD-10-CM

## 2013-03-01 DIAGNOSIS — Z5112 Encounter for antineoplastic immunotherapy: Secondary | ICD-10-CM

## 2013-03-01 DIAGNOSIS — C9002 Multiple myeloma in relapse: Secondary | ICD-10-CM

## 2013-03-01 LAB — KAPPA/LAMBDA LIGHT CHAINS
Kappa free light chain: 0.59 mg/dL (ref 0.33–1.94)
Kappa:Lambda Ratio: 0.06 — ABNORMAL LOW (ref 0.26–1.65)
Lambda Free Lght Chn: 10.1 mg/dL — ABNORMAL HIGH (ref 0.57–2.63)

## 2013-03-01 MED ORDER — ONDANSETRON 8 MG/NS 50 ML IVPB
INTRAVENOUS | Status: AC
  Filled 2013-03-01: qty 8

## 2013-03-01 MED ORDER — HEPARIN SOD (PORK) LOCK FLUSH 100 UNIT/ML IV SOLN
500.0000 [IU] | Freq: Once | INTRAVENOUS | Status: AC | PRN
  Administered 2013-03-01: 500 [IU]
  Filled 2013-03-01: qty 5

## 2013-03-01 MED ORDER — SODIUM CHLORIDE 0.9 % IJ SOLN
10.0000 mL | INTRAMUSCULAR | Status: DC | PRN
  Administered 2013-03-01: 10 mL
  Filled 2013-03-01: qty 10

## 2013-03-01 MED ORDER — DEXTROSE 5 % IV SOLN
27.0000 mg/m2 | Freq: Once | INTRAVENOUS | Status: AC
  Administered 2013-03-01: 60 mg via INTRAVENOUS
  Filled 2013-03-01: qty 30

## 2013-03-01 MED ORDER — ONDANSETRON 8 MG/50ML IVPB (CHCC)
8.0000 mg | Freq: Once | INTRAVENOUS | Status: AC
  Administered 2013-03-01: 8 mg via INTRAVENOUS

## 2013-03-01 MED ORDER — SODIUM CHLORIDE 0.9 % IV SOLN: Freq: Once | INTRAVENOUS | Status: DC

## 2013-03-01 MED ORDER — FENTANYL 25 MCG/HR TD PT72: 25.0000 ug | MEDICATED_PATCH | TRANSDERMAL | Status: DC

## 2013-03-01 MED ORDER — SODIUM CHLORIDE 0.9 % IV SOLN
Freq: Once | INTRAVENOUS | Status: AC
  Administered 2013-03-01: 15:00:00 via INTRAVENOUS

## 2013-03-01 NOTE — Patient Instructions (Signed)
Carfilzomib injection What is this medicine? CARFILZOMIB (kar FILZ oh mib) is a chemotherapy drug that works by slowing or stopping cancer cell growth. This medicine is used to treat multiple myeloma. This medicine may be used for other purposes; ask your health care provider or pharmacist if you have questions. COMMON BRAND NAME(S): KYPROLIS What should I tell my health care provider before I take this medicine? They need to know if you have any of these conditions: -heart disease -irregular heartbeat -liver disease -lung or breathing disease -an unusual or allergic reaction to carfilzomib, or other medicines, foods, dyes, or preservatives -pregnant or trying to get pregnant -breast-feeding How should I use this medicine? This medicine is for injection or infusion into a vein. It is given by a health care professional in a hospital or clinic setting. Talk to your pediatrician regarding the use of this medicine in children. Special care may be needed. Overdosage: If you think you've taken too much of this medicine contact a poison control center or emergency room at once. Overdosage: If you think you have taken too much of this medicine contact a poison control center or emergency room at once. NOTE: This medicine is only for you. Do not share this medicine with others. What if I miss a dose? It is important not to miss your dose. Call your doctor or health care professional if you are unable to keep an appointment. What may interact with this medicine? Interactions are not expected. Give your health care provider a list of all the medicines, herbs, non-prescription drugs, or dietary supplements you use. Also tell them if you smoke, drink alcohol, or use illegal drugs. Some items may interact with your medicine. This list may not describe all possible interactions. Give your health care provider a list of all the medicines, herbs, non-prescription drugs, or dietary supplements you use. Also  tell them if you smoke, drink alcohol, or use illegal drugs. Some items may interact with your medicine. What should I watch for while using this medicine? Your condition will be monitored carefully while you are receiving this medicine. Report any side effects. Continue your course of treatment even though you feel ill unless your doctor tells you to stop. Call your doctor or health care professional for advice if you get a fever, chills or sore throat, or other symptoms of a cold or flu. Do not treat yourself. Try to avoid being around people who are sick. Do not become pregnant while taking this medicine. Women should inform their doctor if they wish to become pregnant or think they might be pregnant. There is a potential for serious side effects to an unborn child. Talk to your health care professional or pharmacist for more information. Do not breast-feed an infant while taking this medicine. Check with your doctor or health care professional if you get an attack of severe diarrhea, nausea and vomiting, or if you sweat a lot. The loss of too much body fluid can make it dangerous for you to take this medicine. You may get dizzy. Do not drive, use machinery, or do anything that needs mental alertness until you know how this medicine affects you. Do not stand or sit up quickly, especially if you are an older patient. This reduces the risk of dizzy or fainting spells. What side effects may I notice from receiving this medicine? Side effects that you should report to your doctor or health care professional as soon as possible: -allergic reactions like skin rash, itching or hives,   swelling of the face, lips, or tongue -breathing problems -chest pain or palpitationschest tightness -cough -dark urine -dizziness -feeling faint or lightheaded -fever or chills -general ill feeling or flu-like symptoms -light-colored stools -palpitations -right upper belly pain -swelling of the legs or ankles -unusual  bleeding or bruising -unusually weak or tired -yellowing of the eyes or skin  Side effects that usually do not require medical attention (Report these to your doctor or health care professional if they continue or are bothersome.): -diarrhea -headache -nausea, vomiting -tiredness This list may not describe all possible side effects. Call your doctor for medical advice about side effects. You may report side effects to FDA at 1-800-FDA-1088. Where should I keep my medicine? This drug is given in a hospital or clinic and will not be stored at home. NOTE: This sheet is a summary. It may not cover all possible information. If you have questions about this medicine, talk to your doctor, pharmacist, or health care provider.  2014, [employer]vier/[employer] Standard. (2011-08-19 17:02:29)  

## 2013-03-04 LAB — PROTEIN ELECTROPHORESIS, SERUM
Albumin ELP: 57.7 % (ref 55.8–66.1)
Beta 2: 20.9 % — ABNORMAL HIGH (ref 3.2–6.5)
Beta Globulin: 6.3 % (ref 4.7–7.2)

## 2013-03-09 ENCOUNTER — Other Ambulatory Visit: Payer: Self-pay | Admitting: Oncology

## 2013-03-15 ENCOUNTER — Other Ambulatory Visit (HOSPITAL_BASED_OUTPATIENT_CLINIC_OR_DEPARTMENT_OTHER): Payer: BC Managed Care – PPO

## 2013-03-15 ENCOUNTER — Ambulatory Visit: Payer: BC Managed Care – PPO

## 2013-03-15 DIAGNOSIS — C9000 Multiple myeloma not having achieved remission: Secondary | ICD-10-CM

## 2013-03-15 DIAGNOSIS — C9002 Multiple myeloma in relapse: Secondary | ICD-10-CM

## 2013-03-15 LAB — CBC WITH DIFFERENTIAL/PLATELET
BASO%: 1.1 % (ref 0.0–2.0)
Basophils Absolute: 0.1 10*3/uL (ref 0.0–0.1)
EOS ABS: 0 10*3/uL (ref 0.0–0.5)
EOS%: 0.2 % (ref 0.0–7.0)
HEMATOCRIT: 37.7 % — AB (ref 38.4–49.9)
HGB: 12.9 g/dL — ABNORMAL LOW (ref 13.0–17.1)
LYMPH#: 0.9 10*3/uL (ref 0.9–3.3)
LYMPH%: 11.7 % — ABNORMAL LOW (ref 14.0–49.0)
MCH: 30.5 pg (ref 27.2–33.4)
MCHC: 34.3 g/dL (ref 32.0–36.0)
MCV: 88.9 fL (ref 79.3–98.0)
MONO#: 0.1 10*3/uL (ref 0.1–0.9)
MONO%: 1.5 % (ref 0.0–14.0)
NEUT%: 85.5 % — ABNORMAL HIGH (ref 39.0–75.0)
NEUTROS ABS: 6.7 10*3/uL — AB (ref 1.5–6.5)
Platelets: 323 10*3/uL (ref 140–400)
RBC: 4.24 10*6/uL (ref 4.20–5.82)
RDW: 15.6 % — ABNORMAL HIGH (ref 11.0–14.6)
WBC: 7.8 10*3/uL (ref 4.0–10.3)

## 2013-03-15 MED ORDER — SODIUM CHLORIDE 0.9 % IV SOLN: Freq: Once | INTRAVENOUS | Status: DC

## 2013-03-15 MED ORDER — DEXTROSE 5 % IV SOLN: 27.0000 mg/m2 | Freq: Once | INTRAVENOUS | Status: DC

## 2013-03-15 MED ORDER — ONDANSETRON 8 MG/NS 50 ML IVPB
INTRAVENOUS | Status: AC
  Filled 2013-03-15: qty 8

## 2013-03-15 MED ORDER — HEPARIN SOD (PORK) LOCK FLUSH 100 UNIT/ML IV SOLN
500.0000 [IU] | Freq: Once | INTRAVENOUS | Status: AC | PRN
  Administered 2013-03-15: 500 [IU]
  Filled 2013-03-15: qty 5

## 2013-03-15 MED ORDER — ONDANSETRON 8 MG/50ML IVPB (CHCC): 8.0000 mg | Freq: Once | INTRAVENOUS | Status: DC

## 2013-03-15 MED ORDER — SODIUM CHLORIDE 0.9 % IJ SOLN
10.0000 mL | INTRAMUSCULAR | Status: DC | PRN
  Administered 2013-03-15: 10 mL
  Filled 2013-03-15: qty 10

## 2013-03-15 NOTE — Progress Notes (Signed)
Per MD and pharmacy notes, patient was not to have treatment until 03/21/13.  PAC de-accessed and patient discharged to home.

## 2013-03-17 ENCOUNTER — Other Ambulatory Visit: Payer: Self-pay | Admitting: Oncology

## 2013-03-20 ENCOUNTER — Other Ambulatory Visit: Payer: Self-pay | Admitting: Oncology

## 2013-03-21 ENCOUNTER — Ambulatory Visit (HOSPITAL_BASED_OUTPATIENT_CLINIC_OR_DEPARTMENT_OTHER): Payer: BC Managed Care – PPO

## 2013-03-21 ENCOUNTER — Ambulatory Visit (HOSPITAL_BASED_OUTPATIENT_CLINIC_OR_DEPARTMENT_OTHER): Payer: BC Managed Care – PPO | Admitting: Oncology

## 2013-03-21 ENCOUNTER — Other Ambulatory Visit (HOSPITAL_BASED_OUTPATIENT_CLINIC_OR_DEPARTMENT_OTHER): Payer: BC Managed Care – PPO

## 2013-03-21 VITALS — BP 143/92 | HR 101 | Temp 98.1°F | Resp 18 | Ht 72.0 in | Wt 219.6 lb

## 2013-03-21 DIAGNOSIS — G609 Hereditary and idiopathic neuropathy, unspecified: Secondary | ICD-10-CM

## 2013-03-21 DIAGNOSIS — Z5112 Encounter for antineoplastic immunotherapy: Secondary | ICD-10-CM

## 2013-03-21 DIAGNOSIS — C9000 Multiple myeloma not having achieved remission: Secondary | ICD-10-CM

## 2013-03-21 DIAGNOSIS — I10 Essential (primary) hypertension: Secondary | ICD-10-CM

## 2013-03-21 DIAGNOSIS — C9002 Multiple myeloma in relapse: Secondary | ICD-10-CM

## 2013-03-21 DIAGNOSIS — I890 Lymphedema, not elsewhere classified: Secondary | ICD-10-CM

## 2013-03-21 LAB — CBC WITH DIFFERENTIAL/PLATELET
BASO%: 0.9 % (ref 0.0–2.0)
Basophils Absolute: 0.1 10*3/uL (ref 0.0–0.1)
EOS%: 0.4 % (ref 0.0–7.0)
Eosinophils Absolute: 0 10*3/uL (ref 0.0–0.5)
HCT: 39.3 % (ref 38.4–49.9)
HGB: 13.4 g/dL (ref 13.0–17.1)
LYMPH#: 1 10*3/uL (ref 0.9–3.3)
LYMPH%: 13.2 % — ABNORMAL LOW (ref 14.0–49.0)
MCH: 30.3 pg (ref 27.2–33.4)
MCHC: 34.2 g/dL (ref 32.0–36.0)
MCV: 88.6 fL (ref 79.3–98.0)
MONO#: 0 10*3/uL — AB (ref 0.1–0.9)
MONO%: 0.6 % (ref 0.0–14.0)
NEUT#: 6.7 10*3/uL — ABNORMAL HIGH (ref 1.5–6.5)
NEUT%: 84.9 % — ABNORMAL HIGH (ref 39.0–75.0)
Platelets: 282 10*3/uL (ref 140–400)
RBC: 4.43 10*6/uL (ref 4.20–5.82)
RDW: 15.8 % — AB (ref 11.0–14.6)
WBC: 7.8 10*3/uL (ref 4.0–10.3)

## 2013-03-21 MED ORDER — SODIUM CHLORIDE 0.9 % IV SOLN
Freq: Once | INTRAVENOUS | Status: AC
  Administered 2013-03-21: 16:00:00 via INTRAVENOUS

## 2013-03-21 MED ORDER — HEPARIN SOD (PORK) LOCK FLUSH 100 UNIT/ML IV SOLN
500.0000 [IU] | Freq: Once | INTRAVENOUS | Status: AC | PRN
  Administered 2013-03-21: 500 [IU]
  Filled 2013-03-21: qty 5

## 2013-03-21 MED ORDER — DEXAMETHASONE SODIUM PHOSPHATE 10 MG/ML IJ SOLN
INTRAMUSCULAR | Status: AC
  Filled 2013-03-21: qty 1

## 2013-03-21 MED ORDER — ONDANSETRON 8 MG/50ML IVPB (CHCC)
8.0000 mg | Freq: Once | INTRAVENOUS | Status: AC
  Administered 2013-03-21: 8 mg via INTRAVENOUS

## 2013-03-21 MED ORDER — SODIUM CHLORIDE 0.9 % IJ SOLN
10.0000 mL | INTRAMUSCULAR | Status: DC | PRN
  Administered 2013-03-21: 10 mL
  Filled 2013-03-21: qty 10

## 2013-03-21 MED ORDER — ONDANSETRON 8 MG/NS 50 ML IVPB
INTRAVENOUS | Status: AC
  Filled 2013-03-21: qty 8

## 2013-03-21 MED ORDER — SODIUM CHLORIDE 0.9 % IV SOLN
Freq: Once | INTRAVENOUS | Status: AC
  Administered 2013-03-21: 17:00:00 via INTRAVENOUS

## 2013-03-21 MED ORDER — DEXTROSE 5 % IV SOLN
27.0000 mg/m2 | Freq: Once | INTRAVENOUS | Status: AC
  Administered 2013-03-21: 60 mg via INTRAVENOUS
  Filled 2013-03-21: qty 30

## 2013-03-21 MED ORDER — DEXAMETHASONE SODIUM PHOSPHATE 10 MG/ML IJ SOLN
10.0000 mg | Freq: Once | INTRAMUSCULAR | Status: AC
  Administered 2013-03-21: 10 mg via INTRAVENOUS

## 2013-03-21 MED ORDER — LENALIDOMIDE 10 MG PO CAPS: 10.0000 mg | ORAL_CAPSULE | Freq: Every day | ORAL | Status: DC

## 2013-03-21 NOTE — Patient Instructions (Signed)
[facility] Discharge Instructions for Patients Receiving Chemotherapy  Today you received the following chemotherapy agents kyprolis.  To help prevent nausea and vomiting after your treatment, we encourage you to take your nausea medication zofran.     If you develop nausea and vomiting that is not controlled by your nausea medication, call the clinic.   BELOW ARE SYMPTOMS THAT SHOULD BE REPORTED IMMEDIATELY:  *FEVER GREATER THAN 100.5 F  *CHILLS WITH OR WITHOUT FEVER  NAUSEA AND VOMITING THAT IS NOT CONTROLLED WITH YOUR NAUSEA MEDICATION  *UNUSUAL SHORTNESS OF BREATH  *UNUSUAL BRUISING OR BLEEDING  TENDERNESS IN MOUTH AND THROAT WITH OR WITHOUT PRESENCE OF ULCERS  *URINARY PROBLEMS  *BOWEL PROBLEMS  UNUSUAL RASH Items with * indicate a potential emergency and should be followed up as soon as possible.  Feel free to call the clinic you have any questions or concerns. The clinic phone number is [phone].

## 2013-03-21 NOTE — Progress Notes (Signed)
   [facility]    OFFICE PROGRESS NOTE   INTERVAL HISTORY:   He returns for scheduled followup of multiple myeloma. He continues treatment with Revlimid/Decadron/Carfilzomib. He last received Carfilzomib on 03/01/2013.  He discontinued Lasix last month. He developed fluid retention at the lower chest wall and legs after stopping Lasix. He has resumed once daily Lasix with improvement in the edema. He noted increased neuropathy pain last week. He remains on the Duragesic patch.   He traveled over the holidays and continues to work.  Objective:  Vital signs in last 24 hours:  Blood pressure 143/92, pulse 101, temperature 98.1 F (36.7 C), temperature source Oral, resp. rate 18, height 6' (1.829 m), weight 219 lb 9.6 oz (99.61 kg).    HEENT: No thrush or ulcers Resp: Lungs clear bilaterally Cardio: Regular rate and rhythm GI: No hepatosplenomegaly Vascular: No leg edema  Portacath/PICC-without erythema  Lab Results:  Lab Results  Component Value Date   WBC 7.8 03/21/2013   HGB 13.4 03/21/2013   HCT 39.3 03/21/2013   MCV 88.6 03/21/2013   PLT 282 03/21/2013   NEUTROABS 6.7* 03/21/2013   02/28/2013: Lambda light chains 10.10, serum M spike 0.97, IgG 1570   Medications: I have reviewed the patient's current medications.  Assessment/Plan: .Multiple myeloma, IgG lambda monoclonal protein  -Initial diagnosis 2008, bone marrow with a 40-50% plasma cells  -Lenalidomide 25 mg,day 1-21 and Decadron 40 mg weekly and 4982, complicated by peripheral neuropathy  -Therapy change to bortezomib IV twice weekly plus Decadron  -High-dose chemotherapy with autologous stem cell transplantation 2009 at [facility]  -Bortezomib, cyclophosphamide, and Decadron December 2012 through February 2014 (discontinued secondary to pulmonary aspergillosis)  -January 2014 M spike rise to 1.1 g/dL from 0.1 g/dL in December 2013. Treatment started with Carlfilzomib, lenalidomide, and Decadron.    -treatment was restarted with Carlfilzomib, lenalidomide, and Decadron 08/16/2012.  -Restaging labs on 10/15/2012 consistent with improvement in the serum M. Protein.  -Continuation of Carfilzomib/Revlimid/dexamethasone.  -Continued improvement in the serum M spike and IgG level on 11/29/2012.  -Improvement in the serum M spike, slight increase in IgG on 01/03/2013.  -Improvement in the serum M spike, stable IgG, stable lambda free light chains 01/31/2013  -The IgG and serum M spike were slightly higher 02/28/2013, stable lambda light chains 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 He has been unable to lower the Duragesic patch dose  4. Irritable bowel syndrome  5. Hypertension  6. "Chest pain "/palpitations July 2014-evaluated by cardiology, scheduled for outpatient followup. Normal stress nuclear study 11/07/2012.     Disposition:  His overall status appears unchanged. He was unable to tolerate being off of Lasix. He will begin another cycle of treatment today. We will check repeat myeloma parameters when he is here on 04/04/2013. He is scheduled to see Dr. Amalia Hailey in February. He will return for an office visit here on 04/18/2013.   Betsy Coder, MD  03/21/2013  4:23 PM

## 2013-03-22 ENCOUNTER — Ambulatory Visit (HOSPITAL_BASED_OUTPATIENT_CLINIC_OR_DEPARTMENT_OTHER): Payer: BC Managed Care – PPO

## 2013-03-22 VITALS — BP 132/63 | HR 72 | Temp 98.0°F | Resp 18

## 2013-03-22 DIAGNOSIS — C9002 Multiple myeloma in relapse: Secondary | ICD-10-CM

## 2013-03-22 DIAGNOSIS — C9000 Multiple myeloma not having achieved remission: Secondary | ICD-10-CM

## 2013-03-22 DIAGNOSIS — Z5112 Encounter for antineoplastic immunotherapy: Secondary | ICD-10-CM

## 2013-03-22 MED ORDER — ONDANSETRON 8 MG/50ML IVPB (CHCC)
8.0000 mg | Freq: Once | INTRAVENOUS | Status: AC
  Administered 2013-03-22: 8 mg via INTRAVENOUS

## 2013-03-22 MED ORDER — CARFILZOMIB CHEMO INJECTION 60 MG
27.0000 mg/m2 | Freq: Once | INTRAVENOUS | Status: AC
  Administered 2013-03-22: 60 mg via INTRAVENOUS
  Filled 2013-03-22: qty 30

## 2013-03-22 MED ORDER — SODIUM CHLORIDE 0.9 % IJ SOLN
10.0000 mL | INTRAMUSCULAR | Status: DC | PRN
  Administered 2013-03-22: 10 mL
  Filled 2013-03-22: qty 10

## 2013-03-22 MED ORDER — DEXAMETHASONE SODIUM PHOSPHATE 10 MG/ML IJ SOLN
INTRAMUSCULAR | Status: AC
  Filled 2013-03-22: qty 1

## 2013-03-22 MED ORDER — DEXAMETHASONE SODIUM PHOSPHATE 10 MG/ML IJ SOLN
10.0000 mg | Freq: Once | INTRAMUSCULAR | Status: AC
  Administered 2013-03-22: 10 mg via INTRAVENOUS

## 2013-03-22 MED ORDER — SODIUM CHLORIDE 0.9 % IV SOLN
Freq: Once | INTRAVENOUS | Status: AC
  Administered 2013-03-22: 16:00:00 via INTRAVENOUS

## 2013-03-22 MED ORDER — ONDANSETRON 8 MG/NS 50 ML IVPB
INTRAVENOUS | Status: AC
  Filled 2013-03-22: qty 8

## 2013-03-22 MED ORDER — HEPARIN SOD (PORK) LOCK FLUSH 100 UNIT/ML IV SOLN
500.0000 [IU] | Freq: Once | INTRAVENOUS | Status: AC | PRN
  Administered 2013-03-22: 500 [IU]
  Filled 2013-03-22: qty 5

## 2013-03-22 NOTE — Patient Instructions (Signed)
[facility] for [facility] Receiving [facility]otherapy  Today you received the following chemotherapy agent: Kyprolis  To help prevent nausea and vomiting after your treatment, we encourage you to take your nausea medication as prescribed.    If you develop nausea and vomiting that is not controlled by your nausea medication, call the clinic.   BELOW ARE SYMPTOMS THAT SHOULD BE REPORTED IMMEDIATELY:  *FEVER GREATER THAN 100.5 F  *CHILLS WITH OR WITHOUT FEVER  NAUSEA AND VOMITING THAT IS NOT CONTROLLED WITH YOUR NAUSEA MEDICATION  *UNUSUAL SHORTNESS OF BREATH  *UNUSUAL BRUISING OR BLEEDING  TENDERNESS IN MOUTH AND THROAT WITH OR WITHOUT PRESENCE OF ULCERS  *URINARY PROBLEMS  *BOWEL PROBLEMS  UNUSUAL RASH Items with * indicate a potential emergency and should be followed up as soon as possible.  Feel free to call the clinic you have any questions or concerns. The clinic phone number is [phone].    

## 2013-03-25 ENCOUNTER — Telehealth: Payer: Self-pay | Admitting: Oncology

## 2013-03-25 NOTE — Telephone Encounter (Signed)
added appts for 1/22, 1/23, 2/5 and 2/6. s/w pt he is aware and will get new schedule when he comes in on 1/15.

## 2013-03-28 ENCOUNTER — Ambulatory Visit (HOSPITAL_BASED_OUTPATIENT_CLINIC_OR_DEPARTMENT_OTHER): Payer: BC Managed Care – PPO

## 2013-03-28 ENCOUNTER — Other Ambulatory Visit (HOSPITAL_BASED_OUTPATIENT_CLINIC_OR_DEPARTMENT_OTHER): Payer: BC Managed Care – PPO

## 2013-03-28 VITALS — BP 170/80 | HR 102 | Temp 97.6°F | Resp 18

## 2013-03-28 DIAGNOSIS — Z5112 Encounter for antineoplastic immunotherapy: Secondary | ICD-10-CM

## 2013-03-28 DIAGNOSIS — C9000 Multiple myeloma not having achieved remission: Secondary | ICD-10-CM

## 2013-03-28 DIAGNOSIS — C9002 Multiple myeloma in relapse: Secondary | ICD-10-CM

## 2013-03-28 LAB — CBC WITH DIFFERENTIAL/PLATELET
BASO%: 0.3 % (ref 0.0–2.0)
BASOS ABS: 0 10*3/uL (ref 0.0–0.1)
EOS%: 0.1 % (ref 0.0–7.0)
Eosinophils Absolute: 0 10*3/uL (ref 0.0–0.5)
HCT: 41 % (ref 38.4–49.9)
HEMOGLOBIN: 13.8 g/dL (ref 13.0–17.1)
LYMPH%: 8.3 % — ABNORMAL LOW (ref 14.0–49.0)
MCH: 30.1 pg (ref 27.2–33.4)
MCHC: 33.6 g/dL (ref 32.0–36.0)
MCV: 89.5 fL (ref 79.3–98.0)
MONO#: 0.1 10*3/uL (ref 0.1–0.9)
MONO%: 0.6 % (ref 0.0–14.0)
NEUT#: 9.2 10*3/uL — ABNORMAL HIGH (ref 1.5–6.5)
NEUT%: 90.7 % — ABNORMAL HIGH (ref 39.0–75.0)
Platelets: 208 10*3/uL (ref 140–400)
RBC: 4.58 10*6/uL (ref 4.20–5.82)
RDW: 15.9 % — AB (ref 11.0–14.6)
WBC: 10.1 10*3/uL (ref 4.0–10.3)
lymph#: 0.8 10*3/uL — ABNORMAL LOW (ref 0.9–3.3)

## 2013-03-28 MED ORDER — DEXTROSE 5 % IV SOLN
27.0000 mg/m2 | Freq: Once | INTRAVENOUS | Status: AC
  Administered 2013-03-28: 60 mg via INTRAVENOUS
  Filled 2013-03-28: qty 30

## 2013-03-28 MED ORDER — DEXAMETHASONE SODIUM PHOSPHATE 10 MG/ML IJ SOLN
INTRAMUSCULAR | Status: AC
  Filled 2013-03-28: qty 1

## 2013-03-28 MED ORDER — DEXAMETHASONE SODIUM PHOSPHATE 10 MG/ML IJ SOLN
10.0000 mg | Freq: Once | INTRAMUSCULAR | Status: AC
  Administered 2013-03-28: 10 mg via INTRAVENOUS

## 2013-03-28 MED ORDER — SODIUM CHLORIDE 0.9 % IV SOLN: Freq: Once | INTRAVENOUS | Status: DC

## 2013-03-28 MED ORDER — SODIUM CHLORIDE 0.9 % IJ SOLN
10.0000 mL | INTRAMUSCULAR | Status: DC | PRN
  Administered 2013-03-28: 10 mL
  Filled 2013-03-28: qty 10

## 2013-03-28 MED ORDER — ONDANSETRON 8 MG/NS 50 ML IVPB
INTRAVENOUS | Status: AC
Start: 2013-03-28 — End: 2013-03-28
  Filled 2013-03-28: qty 8

## 2013-03-28 MED ORDER — SODIUM CHLORIDE 0.9 % IV SOLN
Freq: Once | INTRAVENOUS | Status: AC
  Administered 2013-03-28: 16:00:00 via INTRAVENOUS

## 2013-03-28 MED ORDER — HEPARIN SOD (PORK) LOCK FLUSH 100 UNIT/ML IV SOLN
500.0000 [IU] | Freq: Once | INTRAVENOUS | Status: AC | PRN
  Administered 2013-03-28: 500 [IU]
  Filled 2013-03-28: qty 5

## 2013-03-28 MED ORDER — ONDANSETRON 8 MG/50ML IVPB (CHCC)
8.0000 mg | Freq: Once | INTRAVENOUS | Status: AC
  Administered 2013-03-28: 8 mg via INTRAVENOUS

## 2013-03-28 NOTE — Patient Instructions (Signed)
[facility] Discharge Instructions for Patients Receiving Chemotherapy  Today you received the following chemotherapy agents: Kyprolis   To help prevent nausea and vomiting after your treatment, we encourage you to take your nausea medication: Zofran 8 mg every 12 hrs as needed.   If you develop nausea and vomiting that is not controlled by your nausea medication, call the clinic.   BELOW ARE SYMPTOMS THAT SHOULD BE REPORTED IMMEDIATELY:  *FEVER GREATER THAN 100.5 F  *CHILLS WITH OR WITHOUT FEVER  NAUSEA AND VOMITING THAT IS NOT CONTROLLED WITH YOUR NAUSEA MEDICATION  *UNUSUAL SHORTNESS OF BREATH  *UNUSUAL BRUISING OR BLEEDING  TENDERNESS IN MOUTH AND THROAT WITH OR WITHOUT PRESENCE OF ULCERS  *URINARY PROBLEMS  *BOWEL PROBLEMS  UNUSUAL RASH Items with * indicate a potential emergency and should be followed up as soon as possible.  Feel free to call the clinic you have any questions or concerns. The clinic phone number is [phone].

## 2013-03-29 ENCOUNTER — Ambulatory Visit (HOSPITAL_BASED_OUTPATIENT_CLINIC_OR_DEPARTMENT_OTHER): Payer: BC Managed Care – PPO

## 2013-03-29 VITALS — BP 117/69 | HR 70 | Temp 97.0°F | Resp 18

## 2013-03-29 DIAGNOSIS — C9000 Multiple myeloma not having achieved remission: Secondary | ICD-10-CM

## 2013-03-29 DIAGNOSIS — C9002 Multiple myeloma in relapse: Secondary | ICD-10-CM

## 2013-03-29 DIAGNOSIS — Z5112 Encounter for antineoplastic immunotherapy: Secondary | ICD-10-CM

## 2013-03-29 MED ORDER — SODIUM CHLORIDE 0.9 % IV SOLN
Freq: Once | INTRAVENOUS | Status: AC
  Administered 2013-03-29: 16:00:00 via INTRAVENOUS

## 2013-03-29 MED ORDER — ONDANSETRON 8 MG/50ML IVPB (CHCC)
8.0000 mg | Freq: Once | INTRAVENOUS | Status: AC
  Administered 2013-03-29: 8 mg via INTRAVENOUS

## 2013-03-29 MED ORDER — ONDANSETRON 8 MG/NS 50 ML IVPB
INTRAVENOUS | Status: AC
  Filled 2013-03-29: qty 8

## 2013-03-29 MED ORDER — HEPARIN SOD (PORK) LOCK FLUSH 100 UNIT/ML IV SOLN
500.0000 [IU] | Freq: Once | INTRAVENOUS | Status: AC | PRN
  Administered 2013-03-29: 500 [IU]
  Filled 2013-03-29: qty 5

## 2013-03-29 MED ORDER — DEXTROSE 5 % IV SOLN
27.0000 mg/m2 | Freq: Once | INTRAVENOUS | Status: AC
  Administered 2013-03-29: 60 mg via INTRAVENOUS
  Filled 2013-03-29: qty 30

## 2013-03-29 MED ORDER — SODIUM CHLORIDE 0.9 % IJ SOLN
10.0000 mL | INTRAMUSCULAR | Status: DC | PRN
  Administered 2013-03-29: 10 mL
  Filled 2013-03-29: qty 10

## 2013-03-29 NOTE — Patient Instructions (Signed)
[facility] Discharge Instructions for Patients Receiving Chemotherapy  Today you received the following chemotherapy agents: kyprolis  To help prevent nausea and vomiting after your treatment, we encourage you to take your nausea medication.  Take it as often as prescribed.     If you develop nausea and vomiting that is not controlled by your nausea medication, call the clinic. If it is after clinic hours your family physician or the after hours number for the clinic or go to the Emergency Department.   BELOW ARE SYMPTOMS THAT SHOULD BE REPORTED IMMEDIATELY:  *FEVER GREATER THAN 100.5 F  *CHILLS WITH OR WITHOUT FEVER  NAUSEA AND VOMITING THAT IS NOT CONTROLLED WITH YOUR NAUSEA MEDICATION  *UNUSUAL SHORTNESS OF BREATH  *UNUSUAL BRUISING OR BLEEDING  TENDERNESS IN MOUTH AND THROAT WITH OR WITHOUT PRESENCE OF ULCERS  *URINARY PROBLEMS  *BOWEL PROBLEMS  UNUSUAL RASH Items with * indicate a potential emergency and should be followed up as soon as possible.  Feel free to call the clinic you have any questions or concerns. The clinic phone number is [phone].   I have been informed and understand all the instructions given to me. I know to contact the clinic, my physician, or go to the Emergency Department if any problems should occur. I do not have any questions at this time, but understand that I may call the clinic during office hours   should I have any questions or need assistance in obtaining follow up care.    __________________________________________  _____________  __________ Signature of Patient or Authorized Representative            Date                   Time    __________________________________________ Nurse's Signature    

## 2013-04-04 ENCOUNTER — Other Ambulatory Visit: Payer: Self-pay | Admitting: *Deleted

## 2013-04-04 ENCOUNTER — Other Ambulatory Visit (HOSPITAL_BASED_OUTPATIENT_CLINIC_OR_DEPARTMENT_OTHER): Payer: BC Managed Care – PPO

## 2013-04-04 ENCOUNTER — Ambulatory Visit (HOSPITAL_BASED_OUTPATIENT_CLINIC_OR_DEPARTMENT_OTHER): Payer: BC Managed Care – PPO

## 2013-04-04 VITALS — BP 138/83 | HR 113 | Temp 98.4°F | Resp 18

## 2013-04-04 DIAGNOSIS — C9002 Multiple myeloma in relapse: Secondary | ICD-10-CM

## 2013-04-04 DIAGNOSIS — Z5112 Encounter for antineoplastic immunotherapy: Secondary | ICD-10-CM

## 2013-04-04 DIAGNOSIS — C9000 Multiple myeloma not having achieved remission: Secondary | ICD-10-CM

## 2013-04-04 LAB — CBC WITH DIFFERENTIAL/PLATELET
BASO%: 0.5 % (ref 0.0–2.0)
Basophils Absolute: 0.1 10*3/uL (ref 0.0–0.1)
EOS%: 0.1 % (ref 0.0–7.0)
Eosinophils Absolute: 0 10*3/uL (ref 0.0–0.5)
HCT: 39.9 % (ref 38.4–49.9)
HGB: 13.5 g/dL (ref 13.0–17.1)
LYMPH#: 0.7 10*3/uL — AB (ref 0.9–3.3)
LYMPH%: 6.8 % — ABNORMAL LOW (ref 14.0–49.0)
MCH: 30.4 pg (ref 27.2–33.4)
MCHC: 33.8 g/dL (ref 32.0–36.0)
MCV: 89.8 fL (ref 79.3–98.0)
MONO#: 0 10*3/uL — AB (ref 0.1–0.9)
MONO%: 0.5 % (ref 0.0–14.0)
NEUT#: 9.2 10*3/uL — ABNORMAL HIGH (ref 1.5–6.5)
NEUT%: 92.1 % — AB (ref 39.0–75.0)
Platelets: 228 10*3/uL (ref 140–400)
RBC: 4.44 10*6/uL (ref 4.20–5.82)
RDW: 16.6 % — ABNORMAL HIGH (ref 11.0–14.6)
WBC: 10 10*3/uL (ref 4.0–10.3)

## 2013-04-04 LAB — BASIC METABOLIC PANEL (CC13)
Anion Gap: 13 mEq/L — ABNORMAL HIGH (ref 3–11)
BUN: 17.2 mg/dL (ref 7.0–26.0)
CO2: 24 meq/L (ref 22–29)
CREATININE: 1.2 mg/dL (ref 0.7–1.3)
Calcium: 9.7 mg/dL (ref 8.4–10.4)
Chloride: 104 mEq/L (ref 98–109)
Glucose: 171 mg/dl — ABNORMAL HIGH (ref 70–140)
Potassium: 4.1 mEq/L (ref 3.5–5.1)
SODIUM: 141 meq/L (ref 136–145)

## 2013-04-04 MED ORDER — HEPARIN SOD (PORK) LOCK FLUSH 100 UNIT/ML IV SOLN
500.0000 [IU] | Freq: Once | INTRAVENOUS | Status: AC | PRN
  Administered 2013-04-04: 500 [IU]
  Filled 2013-04-04: qty 5

## 2013-04-04 MED ORDER — SODIUM CHLORIDE 0.9 % IV SOLN
Freq: Once | INTRAVENOUS | Status: AC
  Administered 2013-04-04: 15:00:00 via INTRAVENOUS

## 2013-04-04 MED ORDER — FENTANYL 25 MCG/HR TD PT72: 25.0000 ug | MEDICATED_PATCH | TRANSDERMAL | Status: DC

## 2013-04-04 MED ORDER — ONDANSETRON 8 MG/50ML IVPB (CHCC)
8.0000 mg | Freq: Once | INTRAVENOUS | Status: AC
  Administered 2013-04-04: 8 mg via INTRAVENOUS

## 2013-04-04 MED ORDER — SODIUM CHLORIDE 0.9 % IJ SOLN
10.0000 mL | INTRAMUSCULAR | Status: DC | PRN
  Administered 2013-04-04: 10 mL
  Filled 2013-04-04: qty 10

## 2013-04-04 MED ORDER — DEXTROSE 5 % IV SOLN
27.0000 mg/m2 | Freq: Once | INTRAVENOUS | Status: AC
  Administered 2013-04-04: 60 mg via INTRAVENOUS
  Filled 2013-04-04: qty 30

## 2013-04-04 MED ORDER — ONDANSETRON 8 MG/NS 50 ML IVPB
INTRAVENOUS | Status: AC
  Filled 2013-04-04: qty 8

## 2013-04-04 NOTE — Patient Instructions (Signed)
[facility] Discharge Instructions for Patients Receiving Chemotherapy  Today you received the following chemotherapy agents Kyrprolis.  To help prevent nausea and vomiting after your treatment, we encourage you to take your nausea medication as prescribed.   If you develop nausea and vomiting that is not controlled by your nausea medication, call the clinic.   BELOW ARE SYMPTOMS THAT SHOULD BE REPORTED IMMEDIATELY:  *FEVER GREATER THAN 100.5 F  *CHILLS WITH OR WITHOUT FEVER  NAUSEA AND VOMITING THAT IS NOT CONTROLLED WITH YOUR NAUSEA MEDICATION  *UNUSUAL SHORTNESS OF BREATH  *UNUSUAL BRUISING OR BLEEDING  TENDERNESS IN MOUTH AND THROAT WITH OR WITHOUT PRESENCE OF ULCERS  *URINARY PROBLEMS  *BOWEL PROBLEMS  UNUSUAL RASH Items with * indicate a potential emergency and should be followed up as soon as possible.  Feel free to call the clinic should you have any questions or concerns. The clinic phone number is [phone].  It was my pleasure to take care of you today!  Leeanne Rio, RN

## 2013-04-05 ENCOUNTER — Ambulatory Visit (HOSPITAL_BASED_OUTPATIENT_CLINIC_OR_DEPARTMENT_OTHER): Payer: BC Managed Care – PPO

## 2013-04-05 VITALS — BP 118/65 | HR 79 | Temp 98.6°F | Resp 18

## 2013-04-05 DIAGNOSIS — Z5112 Encounter for antineoplastic immunotherapy: Secondary | ICD-10-CM

## 2013-04-05 DIAGNOSIS — C9000 Multiple myeloma not having achieved remission: Secondary | ICD-10-CM

## 2013-04-05 DIAGNOSIS — C9002 Multiple myeloma in relapse: Secondary | ICD-10-CM

## 2013-04-05 MED ORDER — DEXTROSE 5 % IV SOLN
27.0000 mg/m2 | Freq: Once | INTRAVENOUS | Status: AC
  Administered 2013-04-05: 60 mg via INTRAVENOUS
  Filled 2013-04-05: qty 30

## 2013-04-05 MED ORDER — HEPARIN SOD (PORK) LOCK FLUSH 100 UNIT/ML IV SOLN
500.0000 [IU] | Freq: Once | INTRAVENOUS | Status: AC | PRN
  Administered 2013-04-05: 500 [IU]
  Filled 2013-04-05: qty 5

## 2013-04-05 MED ORDER — SODIUM CHLORIDE 0.9 % IV SOLN
Freq: Once | INTRAVENOUS | Status: AC
  Administered 2013-04-05: 15:00:00 via INTRAVENOUS

## 2013-04-05 MED ORDER — SODIUM CHLORIDE 0.9 % IJ SOLN
10.0000 mL | INTRAMUSCULAR | Status: DC | PRN
  Administered 2013-04-05: 10 mL
  Filled 2013-04-05: qty 10

## 2013-04-05 MED ORDER — ONDANSETRON 8 MG/NS 50 ML IVPB
INTRAVENOUS | Status: AC
  Filled 2013-04-05: qty 8

## 2013-04-05 MED ORDER — ONDANSETRON HCL 8 MG PO TABS
ORAL_TABLET | ORAL | Status: AC
  Filled 2013-04-05: qty 1

## 2013-04-05 MED ORDER — ONDANSETRON 8 MG/50ML IVPB (CHCC)
8.0000 mg | Freq: Once | INTRAVENOUS | Status: AC
  Administered 2013-04-05: 8 mg via INTRAVENOUS

## 2013-04-05 NOTE — Patient Instructions (Signed)
[facility] Discharge Instructions for Patients Receiving Chemotherapy  Today you received the following chemotherapy agents Kyprolis.   To help prevent nausea and vomiting after your treatment, we encourage you to take your nausea medication as prescribed.     If you develop nausea and vomiting that is not controlled by your nausea medication, call the clinic.   BELOW ARE SYMPTOMS THAT SHOULD BE REPORTED IMMEDIATELY:  *FEVER GREATER THAN 100.5 F  *CHILLS WITH OR WITHOUT FEVER  NAUSEA AND VOMITING THAT IS NOT CONTROLLED WITH YOUR NAUSEA MEDICATION  *UNUSUAL SHORTNESS OF BREATH  *UNUSUAL BRUISING OR BLEEDING  TENDERNESS IN MOUTH AND THROAT WITH OR WITHOUT PRESENCE OF ULCERS  *URINARY PROBLEMS  *BOWEL PROBLEMS  UNUSUAL RASH Items with * indicate a potential emergency and should be followed up as soon as possible.  Feel free to call the clinic should you have any questions or concerns. The clinic phone number is [phone].  It was my pleasure to take care of you today!  Tina Raymon Schlarb, RN    

## 2013-04-08 LAB — PROTEIN ELECTROPHORESIS, SERUM
ALPHA-1-GLOBULIN: 3.6 % (ref 2.9–4.9)
Albumin ELP: 59.6 % (ref 55.8–66.1)
Alpha-2-Globulin: 8 % (ref 7.1–11.8)
BETA 2: 19.2 % — AB (ref 3.2–6.5)
Beta Globulin: 7 % (ref 4.7–7.2)
Gamma Globulin: 2.6 % — ABNORMAL LOW (ref 11.1–18.8)
M-Spike, %: 0.85 g/dL
Total Protein, Serum Electrophoresis: 7.4 g/dL (ref 6.0–8.3)

## 2013-04-08 LAB — KAPPA/LAMBDA LIGHT CHAINS
KAPPA FREE LGHT CHN: 0.03 mg/dL — AB (ref 0.33–1.94)
KAPPA LAMBDA RATIO: 0 — AB (ref 0.26–1.65)
LAMBDA FREE LGHT CHN: 8.2 mg/dL — AB (ref 0.57–2.63)

## 2013-04-08 LAB — IGG: IGG (IMMUNOGLOBIN G), SERUM: 1400 mg/dL (ref 650–1600)

## 2013-04-11 ENCOUNTER — Other Ambulatory Visit: Payer: Self-pay | Admitting: Oncology

## 2013-04-14 ENCOUNTER — Other Ambulatory Visit: Payer: Self-pay | Admitting: Oncology

## 2013-04-18 ENCOUNTER — Ambulatory Visit (HOSPITAL_BASED_OUTPATIENT_CLINIC_OR_DEPARTMENT_OTHER): Payer: BC Managed Care – PPO | Admitting: Oncology

## 2013-04-18 ENCOUNTER — Ambulatory Visit (HOSPITAL_BASED_OUTPATIENT_CLINIC_OR_DEPARTMENT_OTHER): Payer: BC Managed Care – PPO

## 2013-04-18 ENCOUNTER — Other Ambulatory Visit (HOSPITAL_BASED_OUTPATIENT_CLINIC_OR_DEPARTMENT_OTHER): Payer: BC Managed Care – PPO

## 2013-04-18 ENCOUNTER — Encounter (INDEPENDENT_AMBULATORY_CARE_PROVIDER_SITE_OTHER): Payer: Self-pay

## 2013-04-18 VITALS — BP 155/87 | HR 78 | Temp 98.1°F | Resp 18 | Ht 72.0 in | Wt 222.5 lb

## 2013-04-18 DIAGNOSIS — G609 Hereditary and idiopathic neuropathy, unspecified: Secondary | ICD-10-CM

## 2013-04-18 DIAGNOSIS — T451X5A Adverse effect of antineoplastic and immunosuppressive drugs, initial encounter: Secondary | ICD-10-CM

## 2013-04-18 DIAGNOSIS — C9000 Multiple myeloma not having achieved remission: Secondary | ICD-10-CM

## 2013-04-18 DIAGNOSIS — C9002 Multiple myeloma in relapse: Secondary | ICD-10-CM

## 2013-04-18 DIAGNOSIS — Z5112 Encounter for antineoplastic immunotherapy: Secondary | ICD-10-CM

## 2013-04-18 DIAGNOSIS — I10 Essential (primary) hypertension: Secondary | ICD-10-CM

## 2013-04-18 DIAGNOSIS — K589 Irritable bowel syndrome without diarrhea: Secondary | ICD-10-CM

## 2013-04-18 DIAGNOSIS — B449 Aspergillosis, unspecified: Secondary | ICD-10-CM

## 2013-04-18 LAB — CBC WITH DIFFERENTIAL/PLATELET
BASO%: 1.5 % (ref 0.0–2.0)
Basophils Absolute: 0.1 10*3/uL (ref 0.0–0.1)
EOS%: 4 % (ref 0.0–7.0)
Eosinophils Absolute: 0.2 10*3/uL (ref 0.0–0.5)
HCT: 38.2 % — ABNORMAL LOW (ref 38.4–49.9)
HGB: 13.2 g/dL (ref 13.0–17.1)
LYMPH#: 0.9 10*3/uL (ref 0.9–3.3)
LYMPH%: 13.9 % — ABNORMAL LOW (ref 14.0–49.0)
MCH: 30.8 pg (ref 27.2–33.4)
MCHC: 34.6 g/dL (ref 32.0–36.0)
MCV: 89 fL (ref 79.3–98.0)
MONO#: 0.3 10*3/uL (ref 0.1–0.9)
MONO%: 4.3 % (ref 0.0–14.0)
NEUT%: 76.3 % — ABNORMAL HIGH (ref 39.0–75.0)
NEUTROS ABS: 4.8 10*3/uL (ref 1.5–6.5)
Platelets: 234 10*3/uL (ref 140–400)
RBC: 4.3 10*6/uL (ref 4.20–5.82)
RDW: 15.9 % — AB (ref 11.0–14.6)
WBC: 6.3 10*3/uL (ref 4.0–10.3)

## 2013-04-18 MED ORDER — SODIUM CHLORIDE 0.9 % IV SOLN
Freq: Once | INTRAVENOUS | Status: AC
  Administered 2013-04-18: 10:00:00 via INTRAVENOUS

## 2013-04-18 MED ORDER — CARFILZOMIB CHEMO INJECTION 60 MG
27.0000 mg/m2 | Freq: Once | INTRAVENOUS | Status: AC
  Administered 2013-04-18: 60 mg via INTRAVENOUS
  Filled 2013-04-18: qty 30

## 2013-04-18 MED ORDER — SODIUM CHLORIDE 0.9 % IJ SOLN
10.0000 mL | INTRAMUSCULAR | Status: DC | PRN
  Administered 2013-04-18: 10 mL
  Filled 2013-04-18: qty 10

## 2013-04-18 MED ORDER — ONDANSETRON 8 MG/50ML IVPB (CHCC)
8.0000 mg | Freq: Once | INTRAVENOUS | Status: AC
  Administered 2013-04-18: 8 mg via INTRAVENOUS

## 2013-04-18 MED ORDER — HEPARIN SOD (PORK) LOCK FLUSH 100 UNIT/ML IV SOLN
500.0000 [IU] | Freq: Once | INTRAVENOUS | Status: AC | PRN
  Administered 2013-04-18: 500 [IU]
  Filled 2013-04-18: qty 5

## 2013-04-18 MED ORDER — ONDANSETRON 8 MG/NS 50 ML IVPB
INTRAVENOUS | Status: AC
  Filled 2013-04-18: qty 8

## 2013-04-18 NOTE — Patient Instructions (Signed)
[facility] Discharge Instructions for Patients Receiving Chemotherapy  Today you received the following chemotherapy agent: Kyprolis  To help prevent nausea and vomiting after your treatment, we encourage you to take your nausea medication as prescribed.    If you develop nausea and vomiting that is not controlled by your nausea medication, call the clinic.   BELOW ARE SYMPTOMS THAT SHOULD BE REPORTED IMMEDIATELY:  *FEVER GREATER THAN 100.5 F  *CHILLS WITH OR WITHOUT FEVER  NAUSEA AND VOMITING THAT IS NOT CONTROLLED WITH YOUR NAUSEA MEDICATION  *UNUSUAL SHORTNESS OF BREATH  *UNUSUAL BRUISING OR BLEEDING  TENDERNESS IN MOUTH AND THROAT WITH OR WITHOUT PRESENCE OF ULCERS  *URINARY PROBLEMS  *BOWEL PROBLEMS  UNUSUAL RASH Items with * indicate a potential emergency and should be followed up as soon as possible.  Feel free to call the clinic you have any questions or concerns. The clinic phone number is [phone].    

## 2013-04-18 NOTE — Progress Notes (Signed)
   Walter Dennis    OFFICE PROGRESS NOTE   INTERVAL HISTORY:   Walter Dennis returns for scheduled followup of multiple myeloma. He continues treatment with Revlimid, Decadron, and Carfilzomib. He feels well. He reports increased "neuropathy "pain in the lower legs and feet. No neuropathy symptoms in the hands. Mild headaches in the evenings.  Objective:  Vital signs in last 24 hours:  Blood pressure 155/87, pulse 78, temperature 98.1 F (36.7 C), temperature source Oral, resp. rate 18, height 6' (1.829 m), weight 222 lb 8 oz (100.925 kg), SpO2 98.00%.    HEENT: No thrush or ulcers Resp: Lungs clear bilaterally Cardio: Regular rate and rhythm GI: No hepatosplenomegaly Vascular: No leg edema   Lab Results:  Lab Results  Component Value Date   WBC 6.3 04/18/2013   HGB 13.2 04/18/2013   HCT 38.2* 04/18/2013   MCV 89.0 04/18/2013   PLT 234 04/18/2013   NEUTROABS 4.8 04/18/2013   04/04/2013: IgG 1400, lambda free light chains 8.2, serum M spike 0.85   Medications: I have reviewed the patient's current medications.  Assessment/plan: 1.Multiple myeloma, IgG lambda monoclonal protein  -Initial diagnosis 2008, bone marrow with a 40-50% plasma cells  -Lenalidomide 25 mg,day 1-21 and Decadron 40 mg weekly and 2130, complicated by peripheral neuropathy  -Therapy change to bortezomib IV twice weekly plus Decadron  -High-dose chemotherapy with autologous stem cell transplantation 2009 at [facility]  -Bortezomib, cyclophosphamide, and Decadron December 2012 through February 2014 (discontinued secondary to pulmonary aspergillosis)  -January 2014 M spike rise to 1.1 g/dL from 0.1 g/dL in December 2013. Treatment started with Carlfilzomib, lenalidomide, and Decadron.  -treatment was restarted with Carlfilzomib, lenalidomide, and Decadron 08/16/2012.  -Restaging labs on 10/15/2012 consistent with improvement in the serum M. Protein.  -Continuation of Carfilzomib/Revlimid/dexamethasone.    -Continued improvement in the serum M spike and IgG level on 11/29/2012.  -Improvement in the serum M spike, slight increase in IgG on 01/03/2013.  -Improvement in the serum M spike, stable IgG, stable lambda free light chains 01/31/2013  -The IgG and serum M spike were slightly higher 02/28/2013, stable lambda light chains  -IgG, M spike, and lambda light chains slightly lower on 04/04/2013 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 He has been unable to lower the Duragesic patch dose  4. Irritable bowel syndrome  5. Hypertension  6. "Chest pain "/palpitations July 2014-evaluated by cardiology, scheduled for outpatient followup. Normal stress nuclear study 11/07/2012.   Disposition:  Walter Dennis appears stable. He will begin another cycle of systemic therapy today. He has been maintained on continuous treatment with carfilzomib/Revlimid/Decadron since June of 2014. I will contact Dr. Amalia Hailey for his recommendation regarding future treatment.  Walter Dennis will return for an office visit in one month.   Betsy Coder, MD  04/18/2013  4:00 PM

## 2013-04-19 ENCOUNTER — Ambulatory Visit (HOSPITAL_BASED_OUTPATIENT_CLINIC_OR_DEPARTMENT_OTHER): Payer: BC Managed Care – PPO

## 2013-04-19 ENCOUNTER — Telehealth: Payer: Self-pay | Admitting: Oncology

## 2013-04-19 VITALS — BP 112/65 | Temp 98.7°F | Resp 18

## 2013-04-19 DIAGNOSIS — C9000 Multiple myeloma not having achieved remission: Secondary | ICD-10-CM

## 2013-04-19 DIAGNOSIS — C9002 Multiple myeloma in relapse: Secondary | ICD-10-CM

## 2013-04-19 DIAGNOSIS — Z5112 Encounter for antineoplastic immunotherapy: Secondary | ICD-10-CM

## 2013-04-19 MED ORDER — ONDANSETRON 8 MG/NS 50 ML IVPB
INTRAVENOUS | Status: AC
  Filled 2013-04-19: qty 8

## 2013-04-19 MED ORDER — HEPARIN SOD (PORK) LOCK FLUSH 100 UNIT/ML IV SOLN
500.0000 [IU] | Freq: Once | INTRAVENOUS | Status: AC | PRN
  Administered 2013-04-19: 500 [IU]
  Filled 2013-04-19: qty 5

## 2013-04-19 MED ORDER — SODIUM CHLORIDE 0.9 % IJ SOLN
10.0000 mL | INTRAMUSCULAR | Status: DC | PRN
  Administered 2013-04-19: 10 mL
  Filled 2013-04-19: qty 10

## 2013-04-19 MED ORDER — SODIUM CHLORIDE 0.9 % IV SOLN
Freq: Once | INTRAVENOUS | Status: AC
  Administered 2013-04-19: 16:00:00 via INTRAVENOUS

## 2013-04-19 MED ORDER — ONDANSETRON 8 MG/50ML IVPB (CHCC)
8.0000 mg | Freq: Once | INTRAVENOUS | Status: AC
  Administered 2013-04-19: 8 mg via INTRAVENOUS

## 2013-04-19 MED ORDER — DEXTROSE 5 % IV SOLN
27.0000 mg/m2 | Freq: Once | INTRAVENOUS | Status: AC
  Administered 2013-04-19: 60 mg via INTRAVENOUS
  Filled 2013-04-19: qty 30

## 2013-04-19 NOTE — Patient Instructions (Signed)
[facility] Discharge Instructions for Patients Receiving Chemotherapy  Today you received the following chemotherapy agents: kyprolis  To help prevent nausea and vomiting after your treatment, we encourage you to take your nausea medication.  Take it as often as prescribed.     If you develop nausea and vomiting that is not controlled by your nausea medication, call the clinic. If it is after clinic hours your family physician or the after hours number for the clinic or go to the Emergency Department.   BELOW ARE SYMPTOMS THAT SHOULD BE REPORTED IMMEDIATELY:  *FEVER GREATER THAN 100.5 F  *CHILLS WITH OR WITHOUT FEVER  NAUSEA AND VOMITING THAT IS NOT CONTROLLED WITH YOUR NAUSEA MEDICATION  *UNUSUAL SHORTNESS OF BREATH  *UNUSUAL BRUISING OR BLEEDING  TENDERNESS IN MOUTH AND THROAT WITH OR WITHOUT PRESENCE OF ULCERS  *URINARY PROBLEMS  *BOWEL PROBLEMS  UNUSUAL RASH Items with * indicate a potential emergency and should be followed up as soon as possible.  Feel free to call the clinic you have any questions or concerns. The clinic phone number is [phone].   I have been informed and understand all the instructions given to me. I know to contact the clinic, my physician, or go to the Emergency Department if any problems should occur. I do not have any questions at this time, but understand that I may call the clinic during office hours   should I have any questions or need assistance in obtaining follow up care.    __________________________________________  _____________  __________ Signature of Patient or Authorized Representative            Date                   Time    __________________________________________ Nurse's Signature    

## 2013-04-19 NOTE — Telephone Encounter (Signed)
s.w. pt and advised that more appt are on his sched...he will pick up new sched at todays visit.

## 2013-04-25 ENCOUNTER — Other Ambulatory Visit: Payer: Self-pay | Admitting: *Deleted

## 2013-04-25 ENCOUNTER — Other Ambulatory Visit (HOSPITAL_BASED_OUTPATIENT_CLINIC_OR_DEPARTMENT_OTHER): Payer: BC Managed Care – PPO

## 2013-04-25 ENCOUNTER — Ambulatory Visit (HOSPITAL_BASED_OUTPATIENT_CLINIC_OR_DEPARTMENT_OTHER): Payer: BC Managed Care – PPO

## 2013-04-25 ENCOUNTER — Encounter (INDEPENDENT_AMBULATORY_CARE_PROVIDER_SITE_OTHER): Payer: Self-pay

## 2013-04-25 VITALS — BP 148/82 | HR 113 | Temp 97.8°F | Resp 19

## 2013-04-25 DIAGNOSIS — C9000 Multiple myeloma not having achieved remission: Secondary | ICD-10-CM

## 2013-04-25 DIAGNOSIS — C9002 Multiple myeloma in relapse: Secondary | ICD-10-CM

## 2013-04-25 DIAGNOSIS — Z5112 Encounter for antineoplastic immunotherapy: Secondary | ICD-10-CM

## 2013-04-25 LAB — CBC WITH DIFFERENTIAL/PLATELET
BASO%: 0.5 % (ref 0.0–2.0)
Basophils Absolute: 0 10*3/uL (ref 0.0–0.1)
EOS ABS: 0 10*3/uL (ref 0.0–0.5)
EOS%: 0.2 % (ref 0.0–7.0)
HCT: 38.7 % (ref 38.4–49.9)
HEMOGLOBIN: 13.4 g/dL (ref 13.0–17.1)
LYMPH%: 11.4 % — ABNORMAL LOW (ref 14.0–49.0)
MCH: 31 pg (ref 27.2–33.4)
MCHC: 34.8 g/dL (ref 32.0–36.0)
MCV: 89.1 fL (ref 79.3–98.0)
MONO#: 0.1 10*3/uL (ref 0.1–0.9)
MONO%: 0.7 % (ref 0.0–14.0)
NEUT%: 87.2 % — ABNORMAL HIGH (ref 39.0–75.0)
NEUTROS ABS: 6.2 10*3/uL (ref 1.5–6.5)
Platelets: 222 10*3/uL (ref 140–400)
RBC: 4.34 10*6/uL (ref 4.20–5.82)
RDW: 16.1 % — AB (ref 11.0–14.6)
WBC: 7.1 10*3/uL (ref 4.0–10.3)
lymph#: 0.8 10*3/uL — ABNORMAL LOW (ref 0.9–3.3)

## 2013-04-25 MED ORDER — SODIUM CHLORIDE 0.9 % IV SOLN
Freq: Once | INTRAVENOUS | Status: AC
  Administered 2013-04-25: 16:00:00 via INTRAVENOUS

## 2013-04-25 MED ORDER — ONDANSETRON 8 MG/NS 50 ML IVPB
INTRAVENOUS | Status: AC
  Filled 2013-04-25: qty 8

## 2013-04-25 MED ORDER — CARFILZOMIB CHEMO INJECTION 60 MG
27.0000 mg/m2 | Freq: Once | INTRAVENOUS | Status: AC
  Administered 2013-04-25: 60 mg via INTRAVENOUS
  Filled 2013-04-25: qty 30

## 2013-04-25 MED ORDER — SODIUM CHLORIDE 0.9 % IJ SOLN
10.0000 mL | INTRAMUSCULAR | Status: DC | PRN
  Administered 2013-04-25: 10 mL
  Filled 2013-04-25: qty 10

## 2013-04-25 MED ORDER — HEPARIN SOD (PORK) LOCK FLUSH 100 UNIT/ML IV SOLN
500.0000 [IU] | Freq: Once | INTRAVENOUS | Status: AC | PRN
  Administered 2013-04-25: 500 [IU]
  Filled 2013-04-25: qty 5

## 2013-04-25 MED ORDER — LENALIDOMIDE 10 MG PO CAPS: 10.0000 mg | ORAL_CAPSULE | Freq: Every day | ORAL | Status: DC

## 2013-04-25 MED ORDER — ONDANSETRON 8 MG/50ML IVPB (CHCC)
8.0000 mg | Freq: Once | INTRAVENOUS | Status: AC
  Administered 2013-04-25: 8 mg via INTRAVENOUS

## 2013-04-25 MED ORDER — DEXAMETHASONE 4 MG PO TABS: 20.0000 mg | ORAL_TABLET | ORAL | Status: DC

## 2013-04-25 NOTE — Telephone Encounter (Signed)
THIS REFILL REQUEST FOR REVLIMID WAS GIVEN TO DR.SHERRILL'S NURSE, SUSAN COWARD,RN.

## 2013-04-25 NOTE — Patient Instructions (Signed)
[facility] Discharge Instructions for Patients Receiving Chemotherapy  Today you received the following chemotherapy agents: Kyprolis   To help prevent nausea and vomiting after your treatment, we encourage you to take your nausea medication as needed.   If you develop nausea and vomiting that is not controlled by your nausea medication, call the clinic.   BELOW ARE SYMPTOMS THAT SHOULD BE REPORTED IMMEDIATELY:  *FEVER GREATER THAN 100.5 F  *CHILLS WITH OR WITHOUT FEVER  NAUSEA AND VOMITING THAT IS NOT CONTROLLED WITH YOUR NAUSEA MEDICATION  *UNUSUAL SHORTNESS OF BREATH  *UNUSUAL BRUISING OR BLEEDING  TENDERNESS IN MOUTH AND THROAT WITH OR WITHOUT PRESENCE OF ULCERS  *URINARY PROBLEMS  *BOWEL PROBLEMS  UNUSUAL RASH Items with * indicate a potential emergency and should be followed up as soon as possible.  Feel free to call the clinic should you have any questions or concerns. The clinic phone number is [phone].    

## 2013-04-26 ENCOUNTER — Ambulatory Visit (HOSPITAL_BASED_OUTPATIENT_CLINIC_OR_DEPARTMENT_OTHER): Payer: BC Managed Care – PPO

## 2013-04-26 ENCOUNTER — Encounter (INDEPENDENT_AMBULATORY_CARE_PROVIDER_SITE_OTHER): Payer: Self-pay

## 2013-04-26 VITALS — BP 129/70 | HR 74 | Temp 98.5°F | Resp 20 | Ht 72.0 in

## 2013-04-26 DIAGNOSIS — Z5112 Encounter for antineoplastic immunotherapy: Secondary | ICD-10-CM

## 2013-04-26 DIAGNOSIS — C9000 Multiple myeloma not having achieved remission: Secondary | ICD-10-CM

## 2013-04-26 DIAGNOSIS — C9002 Multiple myeloma in relapse: Secondary | ICD-10-CM

## 2013-04-26 MED ORDER — SODIUM CHLORIDE 0.9 % IV SOLN
Freq: Once | INTRAVENOUS | Status: AC
  Administered 2013-04-26: 16:00:00 via INTRAVENOUS

## 2013-04-26 MED ORDER — CARFILZOMIB CHEMO INJECTION 60 MG
27.0000 mg/m2 | Freq: Once | INTRAVENOUS | Status: AC
  Administered 2013-04-26: 60 mg via INTRAVENOUS
  Filled 2013-04-26: qty 30

## 2013-04-26 MED ORDER — ONDANSETRON 8 MG/50ML IVPB (CHCC)
8.0000 mg | Freq: Once | INTRAVENOUS | Status: AC
  Administered 2013-04-26: 8 mg via INTRAVENOUS

## 2013-04-26 MED ORDER — SODIUM CHLORIDE 0.9 % IJ SOLN
10.0000 mL | INTRAMUSCULAR | Status: DC | PRN
  Administered 2013-04-26: 10 mL
  Filled 2013-04-26: qty 10

## 2013-04-26 MED ORDER — ONDANSETRON 8 MG/NS 50 ML IVPB
INTRAVENOUS | Status: AC
  Filled 2013-04-26: qty 8

## 2013-04-26 MED ORDER — HEPARIN SOD (PORK) LOCK FLUSH 100 UNIT/ML IV SOLN
500.0000 [IU] | Freq: Once | INTRAVENOUS | Status: AC | PRN
  Administered 2013-04-26: 500 [IU]
  Filled 2013-04-26: qty 5

## 2013-04-26 NOTE — Patient Instructions (Signed)
[facility] Discharge Instructions for Patients Receiving Chemotherapy  Today you received the following chemotherapy agents: Kyprolis  To help prevent nausea and vomiting after your treatment, we encourage you to take your nausea medication as prescribed by your physician.   If you develop nausea and vomiting that is not controlled by your nausea medication, call the clinic.   BELOW ARE SYMPTOMS THAT SHOULD BE REPORTED IMMEDIATELY:  *FEVER GREATER THAN 100.5 F  *CHILLS WITH OR WITHOUT FEVER  NAUSEA AND VOMITING THAT IS NOT CONTROLLED WITH YOUR NAUSEA MEDICATION  *UNUSUAL SHORTNESS OF BREATH  *UNUSUAL BRUISING OR BLEEDING  TENDERNESS IN MOUTH AND THROAT WITH OR WITHOUT PRESENCE OF ULCERS  *URINARY PROBLEMS  *BOWEL PROBLEMS  UNUSUAL RASH Items with * indicate a potential emergency and should be followed up as soon as possible.  Feel free to call the clinic you have any questions or concerns. The clinic phone number is [phone].    

## 2013-05-02 ENCOUNTER — Ambulatory Visit (HOSPITAL_BASED_OUTPATIENT_CLINIC_OR_DEPARTMENT_OTHER): Payer: BC Managed Care – PPO

## 2013-05-02 ENCOUNTER — Encounter (INDEPENDENT_AMBULATORY_CARE_PROVIDER_SITE_OTHER): Payer: Self-pay

## 2013-05-02 ENCOUNTER — Other Ambulatory Visit: Payer: Self-pay | Admitting: *Deleted

## 2013-05-02 ENCOUNTER — Other Ambulatory Visit (HOSPITAL_BASED_OUTPATIENT_CLINIC_OR_DEPARTMENT_OTHER): Payer: BC Managed Care – PPO

## 2013-05-02 VITALS — BP 154/81 | HR 111 | Temp 97.9°F

## 2013-05-02 DIAGNOSIS — C9000 Multiple myeloma not having achieved remission: Secondary | ICD-10-CM

## 2013-05-02 DIAGNOSIS — Z5112 Encounter for antineoplastic immunotherapy: Secondary | ICD-10-CM

## 2013-05-02 DIAGNOSIS — C9002 Multiple myeloma in relapse: Secondary | ICD-10-CM

## 2013-05-02 LAB — CBC WITH DIFFERENTIAL/PLATELET
BASO%: 0.6 % (ref 0.0–2.0)
Basophils Absolute: 0.1 10*3/uL (ref 0.0–0.1)
EOS ABS: 0 10*3/uL (ref 0.0–0.5)
EOS%: 0.1 % (ref 0.0–7.0)
HCT: 39.9 % (ref 38.4–49.9)
HGB: 13.3 g/dL (ref 13.0–17.1)
LYMPH%: 7.9 % — ABNORMAL LOW (ref 14.0–49.0)
MCH: 30.4 pg (ref 27.2–33.4)
MCHC: 33.4 g/dL (ref 32.0–36.0)
MCV: 90.9 fL (ref 79.3–98.0)
MONO#: 0 10*3/uL — AB (ref 0.1–0.9)
MONO%: 0.4 % (ref 0.0–14.0)
NEUT%: 91 % — ABNORMAL HIGH (ref 39.0–75.0)
NEUTROS ABS: 8.6 10*3/uL — AB (ref 1.5–6.5)
PLATELETS: 224 10*3/uL (ref 140–400)
RBC: 4.39 10*6/uL (ref 4.20–5.82)
RDW: 16.1 % — AB (ref 11.0–14.6)
WBC: 9.5 10*3/uL (ref 4.0–10.3)
lymph#: 0.7 10*3/uL — ABNORMAL LOW (ref 0.9–3.3)

## 2013-05-02 LAB — COMPREHENSIVE METABOLIC PANEL (CC13)
ALBUMIN: 4.1 g/dL (ref 3.5–5.0)
ALT: 74 U/L — ABNORMAL HIGH (ref 0–55)
ANION GAP: 14 meq/L — AB (ref 3–11)
AST: 25 U/L (ref 5–34)
Alkaline Phosphatase: 64 U/L (ref 40–150)
BUN: 14.3 mg/dL (ref 7.0–26.0)
CO2: 25 meq/L (ref 22–29)
Calcium: 10 mg/dL (ref 8.4–10.4)
Chloride: 104 mEq/L (ref 98–109)
Creatinine: 1.2 mg/dL (ref 0.7–1.3)
GLUCOSE: 224 mg/dL — AB (ref 70–140)
POTASSIUM: 3.7 meq/L (ref 3.5–5.1)
SODIUM: 142 meq/L (ref 136–145)
TOTAL PROTEIN: 7.8 g/dL (ref 6.4–8.3)
Total Bilirubin: 0.84 mg/dL (ref 0.20–1.20)

## 2013-05-02 MED ORDER — HEPARIN SOD (PORK) LOCK FLUSH 100 UNIT/ML IV SOLN
500.0000 [IU] | Freq: Once | INTRAVENOUS | Status: AC | PRN
  Administered 2013-05-02: 500 [IU]
  Filled 2013-05-02: qty 5

## 2013-05-02 MED ORDER — SODIUM CHLORIDE 0.9 % IJ SOLN
10.0000 mL | INTRAMUSCULAR | Status: DC | PRN
  Administered 2013-05-02: 10 mL
  Filled 2013-05-02: qty 10

## 2013-05-02 MED ORDER — DEXTROSE 5 % IV SOLN
27.0000 mg/m2 | Freq: Once | INTRAVENOUS | Status: AC
  Administered 2013-05-02: 60 mg via INTRAVENOUS
  Filled 2013-05-02: qty 30

## 2013-05-02 MED ORDER — ONDANSETRON 8 MG/NS 50 ML IVPB
INTRAVENOUS | Status: AC
  Filled 2013-05-02: qty 8

## 2013-05-02 MED ORDER — ONDANSETRON 8 MG/50ML IVPB (CHCC)
8.0000 mg | Freq: Once | INTRAVENOUS | Status: AC
  Administered 2013-05-02: 8 mg via INTRAVENOUS

## 2013-05-02 MED ORDER — FENTANYL 25 MCG/HR TD PT72: 25.0000 ug | MEDICATED_PATCH | TRANSDERMAL | Status: DC

## 2013-05-02 MED ORDER — SODIUM CHLORIDE 0.9 % IV SOLN
Freq: Once | INTRAVENOUS | Status: AC
  Administered 2013-05-02: 16:00:00 via INTRAVENOUS

## 2013-05-02 NOTE — Patient Instructions (Signed)
[facility] Discharge Instructions for Patients Receiving Chemotherapy  Today you received the following chemotherapy agent : Kyprolis  To help prevent nausea and vomiting after your treatment, we encourage you to take your nausea medication as needed:  Zofran 8 mg every 12 hours as needed   If you develop nausea and vomiting that is not controlled by your nausea medication, call the clinic.   BELOW ARE SYMPTOMS THAT SHOULD BE REPORTED IMMEDIATELY:  *FEVER GREATER THAN 100.5 F  *CHILLS WITH OR WITHOUT FEVER  NAUSEA AND VOMITING THAT IS NOT CONTROLLED WITH YOUR NAUSEA MEDICATION  *UNUSUAL SHORTNESS OF BREATH  *UNUSUAL BRUISING OR BLEEDING  TENDERNESS IN MOUTH AND THROAT WITH OR WITHOUT PRESENCE OF ULCERS  *URINARY PROBLEMS  *BOWEL PROBLEMS  UNUSUAL RASH Items with * indicate a potential emergency and should be followed up as soon as possible.  Feel free to call the clinic should you have any questions or concerns. The clinic phone number is [phone].  It has been a pleasure to serve you today !

## 2013-05-03 ENCOUNTER — Ambulatory Visit (HOSPITAL_BASED_OUTPATIENT_CLINIC_OR_DEPARTMENT_OTHER): Payer: BC Managed Care – PPO

## 2013-05-03 VITALS — BP 125/61 | HR 70 | Temp 98.0°F

## 2013-05-03 DIAGNOSIS — C9002 Multiple myeloma in relapse: Secondary | ICD-10-CM

## 2013-05-03 DIAGNOSIS — C9000 Multiple myeloma not having achieved remission: Secondary | ICD-10-CM

## 2013-05-03 DIAGNOSIS — Z5112 Encounter for antineoplastic immunotherapy: Secondary | ICD-10-CM

## 2013-05-03 MED ORDER — ONDANSETRON 8 MG/NS 50 ML IVPB
INTRAVENOUS | Status: AC
  Filled 2013-05-03: qty 8

## 2013-05-03 MED ORDER — HEPARIN SOD (PORK) LOCK FLUSH 100 UNIT/ML IV SOLN
500.0000 [IU] | Freq: Once | INTRAVENOUS | Status: AC | PRN
  Administered 2013-05-03: 500 [IU]
  Filled 2013-05-03: qty 5

## 2013-05-03 MED ORDER — SODIUM CHLORIDE 0.9 % IJ SOLN
10.0000 mL | INTRAMUSCULAR | Status: DC | PRN
  Administered 2013-05-03: 10 mL
  Filled 2013-05-03: qty 10

## 2013-05-03 MED ORDER — SODIUM CHLORIDE 0.9 % IV SOLN
Freq: Once | INTRAVENOUS | Status: AC
  Administered 2013-05-03: 16:00:00 via INTRAVENOUS

## 2013-05-03 MED ORDER — ONDANSETRON 8 MG/50ML IVPB (CHCC)
8.0000 mg | Freq: Once | INTRAVENOUS | Status: AC
  Administered 2013-05-03: 8 mg via INTRAVENOUS

## 2013-05-03 MED ORDER — DEXTROSE 5 % IV SOLN
27.0000 mg/m2 | Freq: Once | INTRAVENOUS | Status: AC
  Administered 2013-05-03: 60 mg via INTRAVENOUS
  Filled 2013-05-03: qty 30

## 2013-05-03 NOTE — Patient Instructions (Signed)
[facility] Discharge Instructions for Patients Receiving Chemotherapy  Today you received the following chemotherapy agents Kyprolis To help prevent nausea and vomiting after your treatment, we encourage you to take your nausea medication as prescribed.  If you develop nausea and vomiting that is not controlled by your nausea medication, call the clinic.   BELOW ARE SYMPTOMS THAT SHOULD BE REPORTED IMMEDIATELY:  *FEVER GREATER THAN 100.5 F  *CHILLS WITH OR WITHOUT FEVER  NAUSEA AND VOMITING THAT IS NOT CONTROLLED WITH YOUR NAUSEA MEDICATION  *UNUSUAL SHORTNESS OF BREATH  *UNUSUAL BRUISING OR BLEEDING  TENDERNESS IN MOUTH AND THROAT WITH OR WITHOUT PRESENCE OF ULCERS  *URINARY PROBLEMS  *BOWEL PROBLEMS  UNUSUAL RASH Items with * indicate a potential emergency and should be followed up as soon as possible.  Feel free to call the clinic you have any questions or concerns. The clinic phone number is [phone].    

## 2013-05-06 LAB — PROTEIN ELECTROPHORESIS, SERUM
ALPHA-1-GLOBULIN: 3.6 % (ref 2.9–4.9)
Albumin ELP: 59 % (ref 55.8–66.1)
Alpha-2-Globulin: 7.8 % (ref 7.1–11.8)
Beta 2: 20.3 % — ABNORMAL HIGH (ref 3.2–6.5)
Beta Globulin: 6.3 % (ref 4.7–7.2)
Gamma Globulin: 3 % — ABNORMAL LOW (ref 11.1–18.8)
M-SPIKE, %: 0.86 g/dL
TOTAL PROTEIN, SERUM ELECTROPHOR: 7.6 g/dL (ref 6.0–8.3)

## 2013-05-06 LAB — IGG: IGG (IMMUNOGLOBIN G), SERUM: 1370 mg/dL (ref 650–1600)

## 2013-05-06 LAB — KAPPA/LAMBDA LIGHT CHAINS
Kappa free light chain: 0.03 mg/dL — ABNORMAL LOW (ref 0.33–1.94)
Kappa:Lambda Ratio: 0 — ABNORMAL LOW (ref 0.26–1.65)
LAMBDA FREE LGHT CHN: 8.65 mg/dL — AB (ref 0.57–2.63)

## 2013-05-12 ENCOUNTER — Other Ambulatory Visit: Payer: Self-pay | Admitting: Oncology

## 2013-05-16 ENCOUNTER — Ambulatory Visit (HOSPITAL_BASED_OUTPATIENT_CLINIC_OR_DEPARTMENT_OTHER): Payer: BC Managed Care – PPO | Admitting: Nurse Practitioner

## 2013-05-16 ENCOUNTER — Telehealth: Payer: Self-pay | Admitting: Oncology

## 2013-05-16 ENCOUNTER — Ambulatory Visit (HOSPITAL_BASED_OUTPATIENT_CLINIC_OR_DEPARTMENT_OTHER): Payer: BC Managed Care – PPO

## 2013-05-16 ENCOUNTER — Other Ambulatory Visit (HOSPITAL_BASED_OUTPATIENT_CLINIC_OR_DEPARTMENT_OTHER): Payer: BC Managed Care – PPO

## 2013-05-16 VITALS — BP 139/74 | HR 85 | Temp 98.0°F | Resp 18 | Ht 72.0 in | Wt 223.4 lb

## 2013-05-16 DIAGNOSIS — G609 Hereditary and idiopathic neuropathy, unspecified: Secondary | ICD-10-CM

## 2013-05-16 DIAGNOSIS — Z5112 Encounter for antineoplastic immunotherapy: Secondary | ICD-10-CM

## 2013-05-16 DIAGNOSIS — R0602 Shortness of breath: Secondary | ICD-10-CM

## 2013-05-16 DIAGNOSIS — C9000 Multiple myeloma not having achieved remission: Secondary | ICD-10-CM

## 2013-05-16 DIAGNOSIS — R079 Chest pain, unspecified: Secondary | ICD-10-CM

## 2013-05-16 LAB — CBC WITH DIFFERENTIAL/PLATELET
BASO%: 0.4 % (ref 0.0–2.0)
Basophils Absolute: 0 10*3/uL (ref 0.0–0.1)
EOS%: 0.3 % (ref 0.0–7.0)
Eosinophils Absolute: 0 10*3/uL (ref 0.0–0.5)
HCT: 38 % — ABNORMAL LOW (ref 38.4–49.9)
HGB: 12.8 g/dL — ABNORMAL LOW (ref 13.0–17.1)
LYMPH#: 0.7 10*3/uL — AB (ref 0.9–3.3)
LYMPH%: 10.8 % — ABNORMAL LOW (ref 14.0–49.0)
MCH: 29.9 pg (ref 27.2–33.4)
MCHC: 33.7 g/dL (ref 32.0–36.0)
MCV: 88.8 fL (ref 79.3–98.0)
MONO#: 0.1 10*3/uL (ref 0.1–0.9)
MONO%: 1 % (ref 0.0–14.0)
NEUT#: 5.9 10*3/uL (ref 1.5–6.5)
NEUT%: 87.5 % — ABNORMAL HIGH (ref 39.0–75.0)
Platelets: 259 10*3/uL (ref 140–400)
RBC: 4.28 10*6/uL (ref 4.20–5.82)
RDW: 15 % — AB (ref 11.0–14.6)
WBC: 6.7 10*3/uL (ref 4.0–10.3)

## 2013-05-16 MED ORDER — SODIUM CHLORIDE 0.9 % IJ SOLN
10.0000 mL | INTRAMUSCULAR | Status: DC | PRN
  Administered 2013-05-16: 10 mL
  Filled 2013-05-16: qty 10

## 2013-05-16 MED ORDER — HEPARIN SOD (PORK) LOCK FLUSH 100 UNIT/ML IV SOLN
500.0000 [IU] | Freq: Once | INTRAVENOUS | Status: AC | PRN
  Administered 2013-05-16: 500 [IU]
  Filled 2013-05-16: qty 5

## 2013-05-16 MED ORDER — ONDANSETRON 8 MG/50ML IVPB (CHCC)
8.0000 mg | Freq: Once | INTRAVENOUS | Status: AC
  Administered 2013-05-16: 8 mg via INTRAVENOUS

## 2013-05-16 MED ORDER — DEXTROSE 5 % IV SOLN
27.0000 mg/m2 | Freq: Once | INTRAVENOUS | Status: AC
  Administered 2013-05-16: 60 mg via INTRAVENOUS
  Filled 2013-05-16: qty 30

## 2013-05-16 MED ORDER — SODIUM CHLORIDE 0.9 % IV SOLN
Freq: Once | INTRAVENOUS | Status: AC
  Administered 2013-05-16: 15:00:00 via INTRAVENOUS

## 2013-05-16 MED ORDER — ONDANSETRON 8 MG/NS 50 ML IVPB
INTRAVENOUS | Status: AC
  Filled 2013-05-16: qty 8

## 2013-05-16 NOTE — Telephone Encounter (Signed)
gv and printed appt sched and avs for pt for March adn April....sed added tx.... °

## 2013-05-16 NOTE — Progress Notes (Signed)
OFFICE PROGRESS NOTE  Interval history:  [name] returns for followup of multiple myeloma. He continues Revlimid/Carfilzomib/Decadron. He denies nausea/vomiting. No mouth sores. He had diarrhea for 2-3 days recently. Bowel habits have since returned to baseline. About "midcycle" of the last chemotherapy he noted a temporary increase in neuropathy symptoms. Symptoms have since returned to baseline. Main complaint is fatigue. Over the past month he has noted a burning sensation in the mid chest and shortness of breath when he exerts himself.   Objective: Filed Vitals:   05/16/13 1333  BP: 139/74  Pulse: 85  Temp: 98 F (36.7 C)  Resp: 18   Oropharynx is without thrush or ulceration. Lungs are clear. No wheezes or rales. Regular cardiac rhythm. Abdomen soft and nontender. No organomegaly. No leg edema.   Lab Results: Lab Results  Component Value Date   WBC 6.7 05/16/2013   HGB 12.8* 05/16/2013   HCT 38.0* 05/16/2013   MCV 88.8 05/16/2013   PLT 259 05/16/2013   NEUTROABS 5.9 05/16/2013    Chemistry:    Chemistry      Component Value Date/Time   NA 142 05/02/2013 1527   NA 137 09/28/2012 1050   K 3.7 05/02/2013 1527   K 4.1 09/28/2012 1050   CL 100 09/28/2012 1050   CO2 25 05/02/2013 1527   CO2 23 09/28/2012 1050   BUN 14.3 05/02/2013 1527   BUN 24* 09/28/2012 1050   CREATININE 1.2 05/02/2013 1527   CREATININE 1.16 09/28/2012 1050      Component Value Date/Time   CALCIUM 10.0 05/02/2013 1527   CALCIUM 9.6 09/28/2012 1050   ALKPHOS 64 05/02/2013 1527   ALKPHOS 49 08/16/2012 1546   AST 25 05/02/2013 1527   AST 15 08/16/2012 1546   ALT 74* 05/02/2013 1527   ALT 17 08/16/2012 1546   BILITOT 0.84 05/02/2013 1527   BILITOT 0.5 08/16/2012 1546       Studies/Results: No results found.  Medications: I have reviewed the patient's current medications.  Assessment/Plan: 1.Multiple myeloma, IgG lambda monoclonal protein  -Initial diagnosis 2008, bone marrow with a 40-50% plasma cells  -Lenalidomide 25  mg,day 1-21 and Decadron 40 mg weekly and 6010, complicated by peripheral neuropathy  -Therapy change to bortezomib IV twice weekly plus Decadron  -High-dose chemotherapy with autologous stem cell transplantation 2009 at [facility]  -Bortezomib, cyclophosphamide, and Decadron December 2012 through February 2014 (discontinued secondary to pulmonary aspergillosis)  -January 2014 M spike rise to 1.1 g/dL from 0.1 g/dL in December 2013. Treatment started with Carlfilzomib, lenalidomide, and Decadron.  -treatment was restarted with Carlfilzomib, lenalidomide, and Decadron 08/16/2012.  -Restaging labs on 10/15/2012 consistent with improvement in the serum M. Protein.  -Continuation of Carfilzomib/Revlimid/dexamethasone.  -Continued improvement in the serum M spike and IgG level on 11/29/2012.  -Improvement in the serum M spike, slight increase in IgG on 01/03/2013.  -Improvement in the serum M spike, stable IgG, stable lambda free light chains 01/31/2013  -The IgG and serum M spike were slightly higher 02/28/2013, stable lambda light chains  -IgG, M spike, and lambda light chains slightly lower on 04/04/2013. -Continuation of Carfilzomib/Revlimid/dexamethasone. -IgG, M spike and lambda light chains stable 05/02/2013. 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 He has been unable to lower the Duragesic patch dose  4. Irritable bowel syndrome  5. Hypertension  6. "Chest pain "/palpitations July 2014-evaluated by cardiology, scheduled for outpatient followup. Normal stress nuclear study 11/07/2012. He reports recurrent symptoms over the  past month.  Dispositon-Mr. Regan appears stable. Plan to proceed with the next cycle of Carfilzomib/Revlimid/Decadron today as scheduled. He will return for a followup visit in 4 weeks. He will contact the office the interim with any problems. We recommended he followup with his primary care provider regarding the  recurrent chest symptoms and shortness of breath.  Plan reviewed with Dr. Benay Spice.   Ned Card ANP/GNP-BC

## 2013-05-17 ENCOUNTER — Ambulatory Visit (HOSPITAL_BASED_OUTPATIENT_CLINIC_OR_DEPARTMENT_OTHER): Payer: BC Managed Care – PPO

## 2013-05-17 ENCOUNTER — Other Ambulatory Visit: Payer: Self-pay | Admitting: *Deleted

## 2013-05-17 VITALS — BP 122/69 | HR 81 | Temp 97.6°F | Resp 18

## 2013-05-17 DIAGNOSIS — C9000 Multiple myeloma not having achieved remission: Secondary | ICD-10-CM

## 2013-05-17 DIAGNOSIS — Z5112 Encounter for antineoplastic immunotherapy: Secondary | ICD-10-CM

## 2013-05-17 MED ORDER — ONDANSETRON 8 MG/50ML IVPB (CHCC)
8.0000 mg | Freq: Once | INTRAVENOUS | Status: AC
  Administered 2013-05-17: 8 mg via INTRAVENOUS

## 2013-05-17 MED ORDER — SODIUM CHLORIDE 0.9 % IJ SOLN
10.0000 mL | INTRAMUSCULAR | Status: DC | PRN
  Administered 2013-05-17: 10 mL
  Filled 2013-05-17: qty 10

## 2013-05-17 MED ORDER — HEPARIN SOD (PORK) LOCK FLUSH 100 UNIT/ML IV SOLN
500.0000 [IU] | Freq: Once | INTRAVENOUS | Status: AC | PRN
  Administered 2013-05-17: 500 [IU]
  Filled 2013-05-17: qty 5

## 2013-05-17 MED ORDER — SODIUM CHLORIDE 0.9 % IV SOLN
Freq: Once | INTRAVENOUS | Status: AC
  Administered 2013-05-17: 15:00:00 via INTRAVENOUS

## 2013-05-17 MED ORDER — DEXTROSE 5 % IV SOLN
27.0000 mg/m2 | Freq: Once | INTRAVENOUS | Status: AC
  Administered 2013-05-17: 60 mg via INTRAVENOUS
  Filled 2013-05-17: qty 30

## 2013-05-17 MED ORDER — ONDANSETRON 8 MG/NS 50 ML IVPB
INTRAVENOUS | Status: AC
  Filled 2013-05-17: qty 8

## 2013-05-17 MED ORDER — VALSARTAN 80 MG PO TABS: 80.0000 mg | ORAL_TABLET | Freq: Every day | ORAL | Status: DC

## 2013-05-17 NOTE — Patient Instructions (Signed)
[facility] Discharge Instructions for Patients Receiving Chemotherapy  Today you received the following chemotherapy agents kyprolis  To help prevent nausea and vomiting after your treatment, we encourage you to take your nausea medication as directed   If you develop nausea and vomiting that is not controlled by your nausea medication, call the clinic.   BELOW ARE SYMPTOMS THAT SHOULD BE REPORTED IMMEDIATELY:  *FEVER GREATER THAN 100.5 F  *CHILLS WITH OR WITHOUT FEVER  NAUSEA AND VOMITING THAT IS NOT CONTROLLED WITH YOUR NAUSEA MEDICATION  *UNUSUAL SHORTNESS OF BREATH  *UNUSUAL BRUISING OR BLEEDING  TENDERNESS IN MOUTH AND THROAT WITH OR WITHOUT PRESENCE OF ULCERS  *URINARY PROBLEMS  *BOWEL PROBLEMS  UNUSUAL RASH Items with * indicate a potential emergency and should be followed up as soon as possible.  Feel free to call the clinic you have any questions or concerns. The clinic phone number is [phone].  

## 2013-05-17 NOTE — Telephone Encounter (Signed)
Pt requested refill of Valsartan while in office for chemo. Reviewed with Dr. Benay Spice: order received, sent electronically.

## 2013-05-23 ENCOUNTER — Ambulatory Visit (HOSPITAL_BASED_OUTPATIENT_CLINIC_OR_DEPARTMENT_OTHER): Payer: BC Managed Care – PPO

## 2013-05-23 ENCOUNTER — Other Ambulatory Visit: Payer: Self-pay | Admitting: *Deleted

## 2013-05-23 ENCOUNTER — Other Ambulatory Visit (HOSPITAL_BASED_OUTPATIENT_CLINIC_OR_DEPARTMENT_OTHER): Payer: BC Managed Care – PPO

## 2013-05-23 VITALS — BP 159/89 | HR 93 | Temp 97.9°F | Resp 20

## 2013-05-23 DIAGNOSIS — C9002 Multiple myeloma in relapse: Secondary | ICD-10-CM

## 2013-05-23 DIAGNOSIS — Z5112 Encounter for antineoplastic immunotherapy: Secondary | ICD-10-CM

## 2013-05-23 DIAGNOSIS — C9000 Multiple myeloma not having achieved remission: Secondary | ICD-10-CM

## 2013-05-23 LAB — CBC WITH DIFFERENTIAL/PLATELET
BASO%: 0.8 % (ref 0.0–2.0)
Basophils Absolute: 0.1 10*3/uL (ref 0.0–0.1)
EOS ABS: 0 10*3/uL (ref 0.0–0.5)
EOS%: 0.4 % (ref 0.0–7.0)
HCT: 40.3 % (ref 38.4–49.9)
HGB: 13.6 g/dL (ref 13.0–17.1)
LYMPH%: 11 % — AB (ref 14.0–49.0)
MCH: 30.5 pg (ref 27.2–33.4)
MCHC: 33.8 g/dL (ref 32.0–36.0)
MCV: 90.2 fL (ref 79.3–98.0)
MONO#: 0.1 10*3/uL (ref 0.1–0.9)
MONO%: 1.2 % (ref 0.0–14.0)
NEUT%: 86.6 % — ABNORMAL HIGH (ref 39.0–75.0)
NEUTROS ABS: 7.2 10*3/uL — AB (ref 1.5–6.5)
Platelets: 267 10*3/uL (ref 140–400)
RBC: 4.46 10*6/uL (ref 4.20–5.82)
RDW: 16 % — AB (ref 11.0–14.6)
WBC: 8.3 10*3/uL (ref 4.0–10.3)
lymph#: 0.9 10*3/uL (ref 0.9–3.3)

## 2013-05-23 MED ORDER — ONDANSETRON 8 MG/50ML IVPB (CHCC)
8.0000 mg | Freq: Once | INTRAVENOUS | Status: AC
  Administered 2013-05-23: 8 mg via INTRAVENOUS

## 2013-05-23 MED ORDER — FENTANYL 25 MCG/HR TD PT72: 25.0000 ug | MEDICATED_PATCH | TRANSDERMAL | Status: DC

## 2013-05-23 MED ORDER — SODIUM CHLORIDE 0.9 % IV SOLN
Freq: Once | INTRAVENOUS | Status: AC
  Administered 2013-05-23: 16:00:00 via INTRAVENOUS

## 2013-05-23 MED ORDER — HEPARIN SOD (PORK) LOCK FLUSH 100 UNIT/ML IV SOLN
500.0000 [IU] | Freq: Once | INTRAVENOUS | Status: AC | PRN
  Administered 2013-05-23: 500 [IU]
  Filled 2013-05-23: qty 5

## 2013-05-23 MED ORDER — ONDANSETRON 8 MG/NS 50 ML IVPB
INTRAVENOUS | Status: AC
  Filled 2013-05-23: qty 8

## 2013-05-23 MED ORDER — SODIUM CHLORIDE 0.9 % IJ SOLN
10.0000 mL | INTRAMUSCULAR | Status: DC | PRN
  Administered 2013-05-23: 10 mL
  Filled 2013-05-23: qty 10

## 2013-05-23 MED ORDER — DEXTROSE 5 % IV SOLN
27.0000 mg/m2 | Freq: Once | INTRAVENOUS | Status: AC
  Administered 2013-05-23: 60 mg via INTRAVENOUS
  Filled 2013-05-23: qty 30

## 2013-05-23 NOTE — Patient Instructions (Signed)
[facility] Discharge Instructions for Patients Receiving Chemotherapy  Today you received the following chemotherapy agents Kyprolis.  To help prevent nausea and vomiting after your treatment, we encourage you to take your nausea medication.   If you develop nausea and vomiting that is not controlled by your nausea medication, call the clinic.   BELOW ARE SYMPTOMS THAT SHOULD BE REPORTED IMMEDIATELY:  *FEVER GREATER THAN 100.5 F  *CHILLS WITH OR WITHOUT FEVER  NAUSEA AND VOMITING THAT IS NOT CONTROLLED WITH YOUR NAUSEA MEDICATION  *UNUSUAL SHORTNESS OF BREATH  *UNUSUAL BRUISING OR BLEEDING  TENDERNESS IN MOUTH AND THROAT WITH OR WITHOUT PRESENCE OF ULCERS  *URINARY PROBLEMS  *BOWEL PROBLEMS  UNUSUAL RASH Items with * indicate a potential emergency and should be followed up as soon as possible.  Feel free to call the clinic you have any questions or concerns. The clinic phone number is [phone].    

## 2013-05-24 ENCOUNTER — Ambulatory Visit (HOSPITAL_BASED_OUTPATIENT_CLINIC_OR_DEPARTMENT_OTHER): Payer: BC Managed Care – PPO

## 2013-05-24 VITALS — BP 134/72 | HR 72 | Temp 97.5°F | Resp 18

## 2013-05-24 DIAGNOSIS — Z5112 Encounter for antineoplastic immunotherapy: Secondary | ICD-10-CM

## 2013-05-24 DIAGNOSIS — C9002 Multiple myeloma in relapse: Secondary | ICD-10-CM

## 2013-05-24 DIAGNOSIS — C9000 Multiple myeloma not having achieved remission: Secondary | ICD-10-CM

## 2013-05-24 MED ORDER — SODIUM CHLORIDE 0.9 % IJ SOLN
10.0000 mL | INTRAMUSCULAR | Status: DC | PRN
  Administered 2013-05-24: 10 mL
  Filled 2013-05-24: qty 10

## 2013-05-24 MED ORDER — DEXTROSE 5 % IV SOLN
27.0000 mg/m2 | Freq: Once | INTRAVENOUS | Status: AC
  Administered 2013-05-24: 60 mg via INTRAVENOUS
  Filled 2013-05-24: qty 30

## 2013-05-24 MED ORDER — HEPARIN SOD (PORK) LOCK FLUSH 100 UNIT/ML IV SOLN
500.0000 [IU] | Freq: Once | INTRAVENOUS | Status: AC | PRN
  Administered 2013-05-24: 500 [IU]
  Filled 2013-05-24: qty 5

## 2013-05-24 MED ORDER — ONDANSETRON 8 MG/50ML IVPB (CHCC)
8.0000 mg | Freq: Once | INTRAVENOUS | Status: AC
  Administered 2013-05-24: 8 mg via INTRAVENOUS

## 2013-05-24 MED ORDER — ONDANSETRON 8 MG/NS 50 ML IVPB
INTRAVENOUS | Status: AC
  Filled 2013-05-24: qty 8

## 2013-05-24 MED ORDER — SODIUM CHLORIDE 0.9 % IV SOLN
Freq: Once | INTRAVENOUS | Status: AC
  Administered 2013-05-24: 16:00:00 via INTRAVENOUS

## 2013-05-24 NOTE — Patient Instructions (Signed)
[facility] Discharge Instructions for Patients Receiving Chemotherapy  Today you received the following chemotherapy agents Kyprolis To help prevent nausea and vomiting after your treatment, we encourage you to take your nausea medication as prescribed.  If you develop nausea and vomiting that is not controlled by your nausea medication, call the clinic.   BELOW ARE SYMPTOMS THAT SHOULD BE REPORTED IMMEDIATELY:  *FEVER GREATER THAN 100.5 F  *CHILLS WITH OR WITHOUT FEVER  NAUSEA AND VOMITING THAT IS NOT CONTROLLED WITH YOUR NAUSEA MEDICATION  *UNUSUAL SHORTNESS OF BREATH  *UNUSUAL BRUISING OR BLEEDING  TENDERNESS IN MOUTH AND THROAT WITH OR WITHOUT PRESENCE OF ULCERS  *URINARY PROBLEMS  *BOWEL PROBLEMS  UNUSUAL RASH Items with * indicate a potential emergency and should be followed up as soon as possible.  Feel free to call the clinic you have any questions or concerns. The clinic phone number is [phone].    

## 2013-05-29 ENCOUNTER — Telehealth: Payer: Self-pay | Admitting: *Deleted

## 2013-05-29 NOTE — Telephone Encounter (Signed)
Has emergency situation that he needs to go out of town for and can't make his chemo on 3/19 and 3/20. Asking if OK to skip this treatment and pick up on 4/2 again or does he need to have the treatment next week and push things back?  Made him aware that we will review with MD tomorrow and call back.

## 2013-05-30 ENCOUNTER — Ambulatory Visit: Payer: BC Managed Care – PPO

## 2013-05-30 ENCOUNTER — Other Ambulatory Visit: Payer: BC Managed Care – PPO

## 2013-05-30 NOTE — Telephone Encounter (Signed)
Per Dr. Benay Spice; notified pt that OK with MD to skip this week and confirmed appt for 06/13/13 Lab/MD/tx.  Pt verbalized understanding and expressed appreciation for call back.

## 2013-05-31 ENCOUNTER — Ambulatory Visit: Payer: BC Managed Care – PPO

## 2013-06-05 NOTE — Telephone Encounter (Signed)
miskae 

## 2013-06-09 ENCOUNTER — Other Ambulatory Visit: Payer: Self-pay | Admitting: Oncology

## 2013-06-10 ENCOUNTER — Other Ambulatory Visit: Payer: Self-pay | Admitting: Oncology

## 2013-06-12 ENCOUNTER — Other Ambulatory Visit: Payer: Self-pay

## 2013-06-13 ENCOUNTER — Other Ambulatory Visit (HOSPITAL_BASED_OUTPATIENT_CLINIC_OR_DEPARTMENT_OTHER): Payer: BC Managed Care – PPO

## 2013-06-13 ENCOUNTER — Ambulatory Visit (HOSPITAL_BASED_OUTPATIENT_CLINIC_OR_DEPARTMENT_OTHER): Payer: BC Managed Care – PPO | Admitting: Oncology

## 2013-06-13 ENCOUNTER — Ambulatory Visit (HOSPITAL_BASED_OUTPATIENT_CLINIC_OR_DEPARTMENT_OTHER): Payer: BC Managed Care – PPO

## 2013-06-13 ENCOUNTER — Telehealth: Payer: Self-pay | Admitting: Oncology

## 2013-06-13 VITALS — BP 132/77 | HR 86 | Temp 97.9°F | Resp 18 | Ht 72.0 in | Wt 220.4 lb

## 2013-06-13 DIAGNOSIS — C9000 Multiple myeloma not having achieved remission: Secondary | ICD-10-CM

## 2013-06-13 DIAGNOSIS — Z5112 Encounter for antineoplastic immunotherapy: Secondary | ICD-10-CM

## 2013-06-13 DIAGNOSIS — G620 Drug-induced polyneuropathy: Secondary | ICD-10-CM

## 2013-06-13 LAB — CBC WITH DIFFERENTIAL/PLATELET
BASO%: 0.8 % (ref 0.0–2.0)
Basophils Absolute: 0 10*3/uL (ref 0.0–0.1)
EOS%: 0.5 % (ref 0.0–7.0)
Eosinophils Absolute: 0 10*3/uL (ref 0.0–0.5)
HCT: 37.7 % — ABNORMAL LOW (ref 38.4–49.9)
HGB: 12.6 g/dL — ABNORMAL LOW (ref 13.0–17.1)
LYMPH#: 0.6 10*3/uL — AB (ref 0.9–3.3)
LYMPH%: 10.5 % — ABNORMAL LOW (ref 14.0–49.0)
MCH: 30.1 pg (ref 27.2–33.4)
MCHC: 33.4 g/dL (ref 32.0–36.0)
MCV: 90.1 fL (ref 79.3–98.0)
MONO#: 0.1 10*3/uL (ref 0.1–0.9)
MONO%: 0.9 % (ref 0.0–14.0)
NEUT%: 87.3 % — ABNORMAL HIGH (ref 39.0–75.0)
NEUTROS ABS: 5.4 10*3/uL (ref 1.5–6.5)
Platelets: 259 10*3/uL (ref 140–400)
RBC: 4.18 10*6/uL — AB (ref 4.20–5.82)
RDW: 15.2 % — AB (ref 11.0–14.6)
WBC: 6.2 10*3/uL (ref 4.0–10.3)

## 2013-06-13 LAB — COMPREHENSIVE METABOLIC PANEL (CC13)
ALT: 40 U/L (ref 0–55)
AST: 22 U/L (ref 5–34)
Albumin: 4.1 g/dL (ref 3.5–5.0)
Alkaline Phosphatase: 60 U/L (ref 40–150)
Anion Gap: 14 mEq/L — ABNORMAL HIGH (ref 3–11)
BILIRUBIN TOTAL: 0.94 mg/dL (ref 0.20–1.20)
BUN: 14.8 mg/dL (ref 7.0–26.0)
CHLORIDE: 103 meq/L (ref 98–109)
CO2: 23 mEq/L (ref 22–29)
Calcium: 9.4 mg/dL (ref 8.4–10.4)
Creatinine: 1.2 mg/dL (ref 0.7–1.3)
Glucose: 202 mg/dl — ABNORMAL HIGH (ref 70–140)
POTASSIUM: 3.8 meq/L (ref 3.5–5.1)
Sodium: 140 mEq/L (ref 136–145)
Total Protein: 7.9 g/dL (ref 6.4–8.3)

## 2013-06-13 MED ORDER — ONDANSETRON 8 MG/NS 50 ML IVPB
INTRAVENOUS | Status: AC
  Filled 2013-06-13: qty 8

## 2013-06-13 MED ORDER — ONDANSETRON 8 MG/50ML IVPB (CHCC)
8.0000 mg | Freq: Once | INTRAVENOUS | Status: AC
  Administered 2013-06-13: 8 mg via INTRAVENOUS

## 2013-06-13 MED ORDER — SODIUM CHLORIDE 0.9 % IV SOLN
Freq: Once | INTRAVENOUS | Status: AC
  Administered 2013-06-13: 14:00:00 via INTRAVENOUS

## 2013-06-13 MED ORDER — DEXTROSE 5 % IV SOLN
27.0000 mg/m2 | Freq: Once | INTRAVENOUS | Status: AC
  Administered 2013-06-13: 60 mg via INTRAVENOUS
  Filled 2013-06-13: qty 30

## 2013-06-13 NOTE — Progress Notes (Signed)
  [facility] OFFICE PROGRESS NOTE   Diagnosis: Multiple myeloma  INTERVAL HISTORY:   Walter Dennis returns for scheduled followup of multiple myeloma. He continues treatment with Revlimid, Decadron, and carfilzomib. He reports tolerating the therapy well. No change in his pain or neuropathy symptoms. He recently had an episode of substernal "burning" discomfort when going upstairs. This lasted less than 5 minutes and resolve spontaneously. He reports mild exertional dyspnea.  Objective:  Vital signs in last 24 hours:  Blood pressure 132/77, pulse 86, temperature 97.9 F (36.6 C), temperature source Oral, resp. rate 18, height 6' (1.829 m), weight 220 lb 6.4 oz (99.973 kg), SpO2 98.00%.    HEENT: No thrush or ulcers Resp: Lungs clear bilaterally Cardio: Regular rate and rhythm GI: No hepatosplenomegaly Vascular: No leg edema  Portacath/PICC-without erythema  Lab Results:  Lab Results  Component Value Date   WBC 6.2 06/13/2013   HGB 12.6* 06/13/2013   HCT 37.7* 06/13/2013   MCV 90.1 06/13/2013   PLT 259 06/13/2013   NEUTROABS 5.4 06/13/2013   05/02/2013-IgG 1370, serum M spike 0.86, lambda free light chains 8.6   Imaging:  No results found.  Medications: I have reviewed the patient's current medications.  Assessment/Plan: 1.Multiple myeloma, IgG lambda monoclonal protein  -Initial diagnosis 2008, bone marrow with a 40-50% plasma cells  -Lenalidomide 25 mg,day 1-21 and Decadron 40 mg weekly and 3570, complicated by peripheral neuropathy  -Therapy change to bortezomib IV twice weekly plus Decadron  -High-dose chemotherapy with autologous stem cell transplantation 2009 at [facility]  -Bortezomib, cyclophosphamide, and Decadron December 2012 through February 2014 (discontinued secondary to pulmonary aspergillosis)  -January 2014 M spike rise to 1.1 g/dL from 0.1 g/dL in December 2013. Treatment started with Carlfilzomib, lenalidomide, and Decadron.  -treatment was restarted  with Carlfilzomib, lenalidomide, and Decadron 08/16/2012.  -Restaging labs on 10/15/2012 consistent with improvement in the serum M. Protein.  -Continuation of Carfilzomib/Revlimid/dexamethasone.  -Continued improvement in the serum M spike and IgG level on 11/29/2012.  -Improvement in the serum M spike, slight increase in IgG on 01/03/2013.  -Improvement in the serum M spike, stable IgG, stable lambda free light chains 01/31/2013  -The IgG and serum M spike were slightly higher 02/28/2013, stable lambda light chains  -IgG, M spike, and lambda light chains slightly lower on 04/04/2013.  -Continuation of Carfilzomib/Revlimid/dexamethasone.  -IgG, M spike and lambda light chains stable 05/02/2013.  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 He has been unable to lower the Duragesic patch dose  4. Irritable bowel syndrome  5. Hypertension  6. "Chest pain "/palpitations July 2014-evaluated by cardiology, scheduled for outpatient followup. Normal stress nuclear study 11/07/2012. He reports recurrent symptoms over the past month.    Disposition:  There is no clinical evidence for progression of the multiple myeloma. I reviewed the case with Dr. Amalia Hailey and he recommends continuing the current therapy indefinitely. Walter Dennis is in agreement. He will begin another cycle of chemotherapy today. Mr. Prokop will return for an office visit in one month.  He requested a referral to cardiology for followup of the chest pain.  Betsy Coder, MD  06/13/2013  2:16 PM

## 2013-06-13 NOTE — Telephone Encounter (Signed)
gv adn printed aptp sched and avs for pt for April and May.....sed added tx. °

## 2013-06-14 ENCOUNTER — Encounter: Payer: Self-pay | Admitting: *Deleted

## 2013-06-14 ENCOUNTER — Ambulatory Visit (HOSPITAL_BASED_OUTPATIENT_CLINIC_OR_DEPARTMENT_OTHER): Payer: BC Managed Care – PPO

## 2013-06-14 VITALS — BP 106/62 | HR 72 | Temp 98.2°F | Resp 20

## 2013-06-14 DIAGNOSIS — Z5112 Encounter for antineoplastic immunotherapy: Secondary | ICD-10-CM

## 2013-06-14 DIAGNOSIS — C9000 Multiple myeloma not having achieved remission: Secondary | ICD-10-CM

## 2013-06-14 DIAGNOSIS — C9002 Multiple myeloma in relapse: Secondary | ICD-10-CM

## 2013-06-14 MED ORDER — SODIUM CHLORIDE 0.9 % IV SOLN
Freq: Once | INTRAVENOUS | Status: AC
  Administered 2013-06-14: 14:00:00 via INTRAVENOUS

## 2013-06-14 MED ORDER — DEXTROSE 5 % IV SOLN
27.0000 mg/m2 | Freq: Once | INTRAVENOUS | Status: AC
  Administered 2013-06-14: 60 mg via INTRAVENOUS
  Filled 2013-06-14: qty 30

## 2013-06-14 MED ORDER — ONDANSETRON 8 MG/50ML IVPB (CHCC)
8.0000 mg | Freq: Once | INTRAVENOUS | Status: AC
  Administered 2013-06-14: 8 mg via INTRAVENOUS

## 2013-06-14 MED ORDER — SODIUM CHLORIDE 0.9 % IJ SOLN
10.0000 mL | INTRAMUSCULAR | Status: DC | PRN
  Administered 2013-06-14: 10 mL
  Filled 2013-06-14: qty 10

## 2013-06-14 MED ORDER — HEPARIN SOD (PORK) LOCK FLUSH 100 UNIT/ML IV SOLN
500.0000 [IU] | Freq: Once | INTRAVENOUS | Status: AC | PRN
  Administered 2013-06-14: 500 [IU]
  Filled 2013-06-14: qty 5

## 2013-06-14 MED ORDER — ONDANSETRON 8 MG/NS 50 ML IVPB
INTRAVENOUS | Status: AC
  Filled 2013-06-14: qty 8

## 2013-06-14 NOTE — Progress Notes (Signed)
Asked patient if he needs his Revlimid refilled yet? He says no, " I have plenty".  Checked with [facility]-last shipped on 05/23/13-will need refill for April.

## 2013-06-14 NOTE — Patient Instructions (Signed)
[facility] Discharge Instructions for Patients Receiving Chemotherapy  Today you received the following chemotherapy agents Kyprolis To help prevent nausea and vomiting after your treatment, we encourage you to take your nausea medication as prescribed.  If you develop nausea and vomiting that is not controlled by your nausea medication, call the clinic.   BELOW ARE SYMPTOMS THAT SHOULD BE REPORTED IMMEDIATELY:  *FEVER GREATER THAN 100.5 F  *CHILLS WITH OR WITHOUT FEVER  NAUSEA AND VOMITING THAT IS NOT CONTROLLED WITH YOUR NAUSEA MEDICATION  *UNUSUAL SHORTNESS OF BREATH  *UNUSUAL BRUISING OR BLEEDING  TENDERNESS IN MOUTH AND THROAT WITH OR WITHOUT PRESENCE OF ULCERS  *URINARY PROBLEMS  *BOWEL PROBLEMS  UNUSUAL RASH Items with * indicate a potential emergency and should be followed up as soon as possible.  Feel free to call the clinic you have any questions or concerns. The clinic phone number is [phone].    

## 2013-06-17 ENCOUNTER — Other Ambulatory Visit: Payer: Self-pay | Admitting: *Deleted

## 2013-06-17 DIAGNOSIS — C9000 Multiple myeloma not having achieved remission: Secondary | ICD-10-CM

## 2013-06-17 LAB — PROTEIN ELECTROPHORESIS, SERUM
Albumin ELP: 57.4 % (ref 55.8–66.1)
Alpha-1-Globulin: 4.5 % (ref 2.9–4.9)
Alpha-2-Globulin: 8.4 % (ref 7.1–11.8)
Beta 2: 20.6 % — ABNORMAL HIGH (ref 3.2–6.5)
Beta Globulin: 6.3 % (ref 4.7–7.2)
GAMMA GLOBULIN: 2.8 % — AB (ref 11.1–18.8)
M-Spike, %: 1.18 g/dL
Total Protein, Serum Electrophoresis: 7.5 g/dL (ref 6.0–8.3)

## 2013-06-17 LAB — KAPPA/LAMBDA LIGHT CHAINS
KAPPA LAMBDA RATIO: 0.01 — AB (ref 0.26–1.65)
Kappa free light chain: 0.15 mg/dL — ABNORMAL LOW (ref 0.33–1.94)
Lambda Free Lght Chn: 14.9 mg/dL — ABNORMAL HIGH (ref 0.57–2.63)

## 2013-06-17 LAB — IGG: IgG (Immunoglobin G), Serum: 1550 mg/dL (ref 650–1600)

## 2013-06-17 MED ORDER — LENALIDOMIDE 10 MG PO CAPS: 10.0000 mg | ORAL_CAPSULE | Freq: Every day | ORAL | Status: DC

## 2013-06-18 ENCOUNTER — Other Ambulatory Visit: Payer: Self-pay | Admitting: Oncology

## 2013-06-20 ENCOUNTER — Ambulatory Visit (HOSPITAL_BASED_OUTPATIENT_CLINIC_OR_DEPARTMENT_OTHER): Payer: BC Managed Care – PPO

## 2013-06-20 ENCOUNTER — Other Ambulatory Visit (HOSPITAL_BASED_OUTPATIENT_CLINIC_OR_DEPARTMENT_OTHER): Payer: BC Managed Care – PPO

## 2013-06-20 VITALS — BP 138/84 | HR 99 | Temp 98.0°F | Resp 18

## 2013-06-20 DIAGNOSIS — C9002 Multiple myeloma in relapse: Secondary | ICD-10-CM

## 2013-06-20 DIAGNOSIS — C9000 Multiple myeloma not having achieved remission: Secondary | ICD-10-CM

## 2013-06-20 DIAGNOSIS — Z5112 Encounter for antineoplastic immunotherapy: Secondary | ICD-10-CM

## 2013-06-20 DIAGNOSIS — Z452 Encounter for adjustment and management of vascular access device: Secondary | ICD-10-CM

## 2013-06-20 LAB — CBC WITH DIFFERENTIAL/PLATELET
BASO%: 0.2 % (ref 0.0–2.0)
Basophils Absolute: 0 10*3/uL (ref 0.0–0.1)
EOS%: 0 % (ref 0.0–7.0)
Eosinophils Absolute: 0 10*3/uL (ref 0.0–0.5)
HCT: 37.9 % — ABNORMAL LOW (ref 38.4–49.9)
HGB: 12.9 g/dL — ABNORMAL LOW (ref 13.0–17.1)
LYMPH%: 13.1 % — ABNORMAL LOW (ref 14.0–49.0)
MCH: 30.6 pg (ref 27.2–33.4)
MCHC: 34 g/dL (ref 32.0–36.0)
MCV: 90 fL (ref 79.3–98.0)
MONO#: 0.1 10*3/uL (ref 0.1–0.9)
MONO%: 1.1 % (ref 0.0–14.0)
NEUT%: 85.6 % — ABNORMAL HIGH (ref 39.0–75.0)
NEUTROS ABS: 4.6 10*3/uL (ref 1.5–6.5)
Platelets: 216 10*3/uL (ref 140–400)
RBC: 4.21 10*6/uL (ref 4.20–5.82)
RDW: 14.6 % (ref 11.0–14.6)
WBC: 5.4 10*3/uL (ref 4.0–10.3)
lymph#: 0.7 10*3/uL — ABNORMAL LOW (ref 0.9–3.3)

## 2013-06-20 MED ORDER — ONDANSETRON 8 MG/NS 50 ML IVPB
INTRAVENOUS | Status: AC
  Filled 2013-06-20: qty 8

## 2013-06-20 MED ORDER — HEPARIN SOD (PORK) LOCK FLUSH 100 UNIT/ML IV SOLN
500.0000 [IU] | Freq: Once | INTRAVENOUS | Status: AC | PRN
  Administered 2013-06-20: 500 [IU]
  Filled 2013-06-20: qty 5

## 2013-06-20 MED ORDER — HEPARIN SOD (PORK) LOCK FLUSH 100 UNIT/ML IV SOLN
500.0000 [IU] | Freq: Once | INTRAVENOUS | Status: AC
  Administered 2013-06-20: 500 [IU] via INTRAVENOUS
  Filled 2013-06-20: qty 5

## 2013-06-20 MED ORDER — ALTEPLASE 2 MG IJ SOLR
2.0000 mg | Freq: Once | INTRAMUSCULAR | Status: AC | PRN
  Administered 2013-06-20: 2 mg
  Filled 2013-06-20: qty 2

## 2013-06-20 MED ORDER — CARFILZOMIB CHEMO INJECTION 60 MG
27.0000 mg/m2 | Freq: Once | INTRAVENOUS | Status: AC
  Administered 2013-06-20: 60 mg via INTRAVENOUS
  Filled 2013-06-20: qty 30

## 2013-06-20 MED ORDER — ONDANSETRON 8 MG/50ML IVPB (CHCC)
8.0000 mg | Freq: Once | INTRAVENOUS | Status: AC
  Administered 2013-06-20: 8 mg via INTRAVENOUS

## 2013-06-20 MED ORDER — SODIUM CHLORIDE 0.9 % IJ SOLN
10.0000 mL | INTRAMUSCULAR | Status: DC | PRN
  Administered 2013-06-20: 10 mL via INTRAVENOUS
  Filled 2013-06-20: qty 10

## 2013-06-20 MED ORDER — SODIUM CHLORIDE 0.9 % IJ SOLN
10.0000 mL | INTRAMUSCULAR | Status: DC | PRN
  Administered 2013-06-20: 10 mL
  Filled 2013-06-20: qty 10

## 2013-06-20 MED ORDER — SODIUM CHLORIDE 0.9 % IV SOLN
Freq: Once | INTRAVENOUS | Status: AC
  Administered 2013-06-20: 17:00:00 via INTRAVENOUS

## 2013-06-20 NOTE — Patient Instructions (Signed)
[facility] Discharge Instructions for Patients Receiving Chemotherapy  Today you received the following chemotherapy agents KYPROLIS To help prevent nausea and vomiting after your treatment, we encourage you to take your nausea medication.   If you develop nausea and vomiting that is not controlled by your nausea medication, call the clinic.   BELOW ARE SYMPTOMS THAT SHOULD BE REPORTED IMMEDIATELY:  *FEVER GREATER THAN 100.5 F  *CHILLS WITH OR WITHOUT FEVER  NAUSEA AND VOMITING THAT IS NOT CONTROLLED WITH YOUR NAUSEA MEDICATION  *UNUSUAL SHORTNESS OF BREATH  *UNUSUAL BRUISING OR BLEEDING  TENDERNESS IN MOUTH AND THROAT WITH OR WITHOUT PRESENCE OF ULCERS  *URINARY PROBLEMS  *BOWEL PROBLEMS  UNUSUAL RASH Items with * indicate a potential emergency and should be followed up as soon as possible.  Feel free to call the clinic you have any questions or concerns. The clinic phone number is [phone].

## 2013-06-20 NOTE — Progress Notes (Signed)
Pt stated he took his steroid today at home.

## 2013-06-21 ENCOUNTER — Ambulatory Visit (HOSPITAL_BASED_OUTPATIENT_CLINIC_OR_DEPARTMENT_OTHER): Payer: BC Managed Care – PPO

## 2013-06-21 VITALS — BP 125/58 | HR 78 | Temp 98.1°F | Resp 16

## 2013-06-21 DIAGNOSIS — C9002 Multiple myeloma in relapse: Secondary | ICD-10-CM

## 2013-06-21 DIAGNOSIS — C9000 Multiple myeloma not having achieved remission: Secondary | ICD-10-CM

## 2013-06-21 DIAGNOSIS — Z5112 Encounter for antineoplastic immunotherapy: Secondary | ICD-10-CM

## 2013-06-21 MED ORDER — SODIUM CHLORIDE 0.9 % IV SOLN
Freq: Once | INTRAVENOUS | Status: AC
  Administered 2013-06-21: 15:00:00 via INTRAVENOUS

## 2013-06-21 MED ORDER — PREGABALIN 50 MG PO CAPS
50.0000 mg | ORAL_CAPSULE | Freq: Two times a day (BID) | ORAL | Status: DC
Start: 2013-06-21 — End: 2013-10-30

## 2013-06-21 MED ORDER — HEPARIN SOD (PORK) LOCK FLUSH 100 UNIT/ML IV SOLN
500.0000 [IU] | Freq: Once | INTRAVENOUS | Status: AC | PRN
  Administered 2013-06-21: 500 [IU]
  Filled 2013-06-21: qty 5

## 2013-06-21 MED ORDER — SODIUM CHLORIDE 0.9 % IJ SOLN
10.0000 mL | INTRAMUSCULAR | Status: DC | PRN
  Administered 2013-06-21: 10 mL
  Filled 2013-06-21: qty 10

## 2013-06-21 MED ORDER — DEXTROSE 5 % IV SOLN
27.0000 mg/m2 | Freq: Once | INTRAVENOUS | Status: AC
  Administered 2013-06-21: 60 mg via INTRAVENOUS
  Filled 2013-06-21: qty 30

## 2013-06-21 MED ORDER — ONDANSETRON 8 MG/50ML IVPB (CHCC)
8.0000 mg | Freq: Once | INTRAVENOUS | Status: AC
  Administered 2013-06-21: 8 mg via INTRAVENOUS

## 2013-06-21 MED ORDER — ONDANSETRON 8 MG/NS 50 ML IVPB
INTRAVENOUS | Status: AC
  Filled 2013-06-21: qty 8

## 2013-06-21 NOTE — Patient Instructions (Signed)
[facility] Discharge Instructions for Patients Receiving Chemotherapy  Today you received the following chemotherapy agents Kyprolis  To help prevent nausea and vomiting after your treatment, we encourage you to take your nausea medication as needed   If you develop nausea and vomiting that is not controlled by your nausea medication, call the clinic.   BELOW ARE SYMPTOMS THAT SHOULD BE REPORTED IMMEDIATELY:  *FEVER GREATER THAN 100.5 F  *CHILLS WITH OR WITHOUT FEVER  NAUSEA AND VOMITING THAT IS NOT CONTROLLED WITH YOUR NAUSEA MEDICATION  *UNUSUAL SHORTNESS OF BREATH  *UNUSUAL BRUISING OR BLEEDING  TENDERNESS IN MOUTH AND THROAT WITH OR WITHOUT PRESENCE OF ULCERS  *URINARY PROBLEMS  *BOWEL PROBLEMS  UNUSUAL RASH Items with * indicate a potential emergency and should be followed up as soon as possible.  Feel free to call the clinic you have any questions or concerns. The clinic phone number is [phone].

## 2013-06-26 ENCOUNTER — Telehealth: Payer: Self-pay | Admitting: *Deleted

## 2013-06-26 NOTE — Telephone Encounter (Signed)
Call from [facility] to confirm pt's phone number. They have been unable to reach pt to ship Revlimid. Our records show same phone # for pt, left message for pt to contact pharmacy. Will remind pt during infusion appt 4/16.

## 2013-06-27 ENCOUNTER — Other Ambulatory Visit (HOSPITAL_BASED_OUTPATIENT_CLINIC_OR_DEPARTMENT_OTHER): Payer: BC Managed Care – PPO

## 2013-06-27 ENCOUNTER — Ambulatory Visit (HOSPITAL_BASED_OUTPATIENT_CLINIC_OR_DEPARTMENT_OTHER): Payer: BC Managed Care – PPO

## 2013-06-27 VITALS — BP 154/86 | HR 106 | Temp 97.7°F | Resp 19

## 2013-06-27 DIAGNOSIS — C9000 Multiple myeloma not having achieved remission: Secondary | ICD-10-CM

## 2013-06-27 DIAGNOSIS — Z5112 Encounter for antineoplastic immunotherapy: Secondary | ICD-10-CM

## 2013-06-27 DIAGNOSIS — C9002 Multiple myeloma in relapse: Secondary | ICD-10-CM

## 2013-06-27 LAB — CBC WITH DIFFERENTIAL/PLATELET
BASO%: 0.2 % (ref 0.0–2.0)
BASOS ABS: 0 10*3/uL (ref 0.0–0.1)
EOS%: 0.2 % (ref 0.0–7.0)
Eosinophils Absolute: 0 10*3/uL (ref 0.0–0.5)
HEMATOCRIT: 36.7 % — AB (ref 38.4–49.9)
HGB: 12.5 g/dL — ABNORMAL LOW (ref 13.0–17.1)
LYMPH%: 8.5 % — AB (ref 14.0–49.0)
MCH: 30.6 pg (ref 27.2–33.4)
MCHC: 34.1 g/dL (ref 32.0–36.0)
MCV: 89.8 fL (ref 79.3–98.0)
MONO#: 0.1 10*3/uL (ref 0.1–0.9)
MONO%: 0.8 % (ref 0.0–14.0)
NEUT#: 7.5 10*3/uL — ABNORMAL HIGH (ref 1.5–6.5)
NEUT%: 90.3 % — ABNORMAL HIGH (ref 39.0–75.0)
PLATELETS: 248 10*3/uL (ref 140–400)
RBC: 4.09 10*6/uL — ABNORMAL LOW (ref 4.20–5.82)
RDW: 14.9 % — ABNORMAL HIGH (ref 11.0–14.6)
WBC: 8.3 10*3/uL (ref 4.0–10.3)
lymph#: 0.7 10*3/uL — ABNORMAL LOW (ref 0.9–3.3)

## 2013-06-27 MED ORDER — DEXTROSE 5 % IV SOLN
27.0000 mg/m2 | Freq: Once | INTRAVENOUS | Status: AC
  Administered 2013-06-27: 60 mg via INTRAVENOUS
  Filled 2013-06-27: qty 30

## 2013-06-27 MED ORDER — SODIUM CHLORIDE 0.9 % IV SOLN
Freq: Once | INTRAVENOUS | Status: AC
  Administered 2013-06-27: 16:00:00 via INTRAVENOUS

## 2013-06-27 MED ORDER — ONDANSETRON 8 MG/NS 50 ML IVPB
INTRAVENOUS | Status: AC
  Filled 2013-06-27: qty 8

## 2013-06-27 MED ORDER — SODIUM CHLORIDE 0.9 % IJ SOLN
10.0000 mL | INTRAMUSCULAR | Status: DC | PRN
  Administered 2013-06-27: 10 mL
  Filled 2013-06-27: qty 10

## 2013-06-27 MED ORDER — ONDANSETRON 8 MG/50ML IVPB (CHCC)
8.0000 mg | Freq: Once | INTRAVENOUS | Status: AC
  Administered 2013-06-27: 8 mg via INTRAVENOUS

## 2013-06-27 MED ORDER — HEPARIN SOD (PORK) LOCK FLUSH 100 UNIT/ML IV SOLN
500.0000 [IU] | Freq: Once | INTRAVENOUS | Status: AC | PRN
  Administered 2013-06-27: 500 [IU]
  Filled 2013-06-27: qty 5

## 2013-06-27 NOTE — Progress Notes (Signed)
Patient reports he refilled Revlimid today, states he was left on hold 15-20 mins this week and that is why he had not refilled, but refill completed today. Reports Revlimid to be delivered tomorrow. Per Dr. Benay Spice, no CMET needed today. CBC within treatment parameters.

## 2013-06-28 ENCOUNTER — Ambulatory Visit (HOSPITAL_BASED_OUTPATIENT_CLINIC_OR_DEPARTMENT_OTHER): Payer: BC Managed Care – PPO

## 2013-06-28 ENCOUNTER — Other Ambulatory Visit: Payer: Self-pay | Admitting: *Deleted

## 2013-06-28 VITALS — BP 141/65 | HR 65 | Temp 98.2°F | Resp 20

## 2013-06-28 DIAGNOSIS — C9000 Multiple myeloma not having achieved remission: Secondary | ICD-10-CM

## 2013-06-28 DIAGNOSIS — C9002 Multiple myeloma in relapse: Secondary | ICD-10-CM

## 2013-06-28 DIAGNOSIS — Z5112 Encounter for antineoplastic immunotherapy: Secondary | ICD-10-CM

## 2013-06-28 MED ORDER — SODIUM CHLORIDE 0.9 % IJ SOLN
10.0000 mL | INTRAMUSCULAR | Status: DC | PRN
  Administered 2013-06-28: 10 mL
  Filled 2013-06-28: qty 10

## 2013-06-28 MED ORDER — SODIUM CHLORIDE 0.9 % IV SOLN
Freq: Once | INTRAVENOUS | Status: AC
  Administered 2013-06-28: 15:00:00 via INTRAVENOUS

## 2013-06-28 MED ORDER — ONDANSETRON 8 MG/50ML IVPB (CHCC)
8.0000 mg | Freq: Once | INTRAVENOUS | Status: AC
  Administered 2013-06-28: 8 mg via INTRAVENOUS

## 2013-06-28 MED ORDER — FENTANYL 25 MCG/HR TD PT72: 25.0000 ug | MEDICATED_PATCH | TRANSDERMAL | Status: DC

## 2013-06-28 MED ORDER — ONDANSETRON 8 MG/NS 50 ML IVPB
INTRAVENOUS | Status: AC
  Filled 2013-06-28: qty 8

## 2013-06-28 MED ORDER — HEPARIN SOD (PORK) LOCK FLUSH 100 UNIT/ML IV SOLN
500.0000 [IU] | Freq: Once | INTRAVENOUS | Status: AC | PRN
  Administered 2013-06-28: 500 [IU]
  Filled 2013-06-28: qty 5

## 2013-06-28 MED ORDER — DEXTROSE 5 % IV SOLN
27.0000 mg/m2 | Freq: Once | INTRAVENOUS | Status: AC
  Administered 2013-06-28: 60 mg via INTRAVENOUS
  Filled 2013-06-28: qty 30

## 2013-06-28 NOTE — Patient Instructions (Signed)
[facility] Discharge Instructions for Patients Receiving Chemotherapy  Today you received the following chemotherapy agents Kyprolis To help prevent nausea and vomiting after your treatment, we encourage you to take your nausea medication as prescribed.  If you develop nausea and vomiting that is not controlled by your nausea medication, call the clinic.   BELOW ARE SYMPTOMS THAT SHOULD BE REPORTED IMMEDIATELY:  *FEVER GREATER THAN 100.5 F  *CHILLS WITH OR WITHOUT FEVER  NAUSEA AND VOMITING THAT IS NOT CONTROLLED WITH YOUR NAUSEA MEDICATION  *UNUSUAL SHORTNESS OF BREATH  *UNUSUAL BRUISING OR BLEEDING  TENDERNESS IN MOUTH AND THROAT WITH OR WITHOUT PRESENCE OF ULCERS  *URINARY PROBLEMS  *BOWEL PROBLEMS  UNUSUAL RASH Items with * indicate a potential emergency and should be followed up as soon as possible.  Feel free to call the clinic you have any questions or concerns. The clinic phone number is [phone].    

## 2013-07-11 ENCOUNTER — Ambulatory Visit (HOSPITAL_BASED_OUTPATIENT_CLINIC_OR_DEPARTMENT_OTHER): Payer: BC Managed Care – PPO | Admitting: Nurse Practitioner

## 2013-07-11 ENCOUNTER — Ambulatory Visit (HOSPITAL_BASED_OUTPATIENT_CLINIC_OR_DEPARTMENT_OTHER): Payer: BC Managed Care – PPO

## 2013-07-11 ENCOUNTER — Other Ambulatory Visit: Payer: Self-pay | Admitting: Nurse Practitioner

## 2013-07-11 ENCOUNTER — Ambulatory Visit (HOSPITAL_COMMUNITY)
Admission: RE | Admit: 2013-07-11 | Discharge: 2013-07-11 | Disposition: A | Discharge status: Home / Self Care | Payer: BC Managed Care – PPO | Source: Ambulatory Visit | Attending: Nurse Practitioner | Admitting: Nurse Practitioner

## 2013-07-11 ENCOUNTER — Other Ambulatory Visit: Payer: Self-pay | Admitting: Oncology

## 2013-07-11 ENCOUNTER — Other Ambulatory Visit (HOSPITAL_BASED_OUTPATIENT_CLINIC_OR_DEPARTMENT_OTHER): Payer: BC Managed Care – PPO

## 2013-07-11 VITALS — BP 161/92 | HR 85 | Temp 98.4°F | Resp 20 | Ht 72.0 in | Wt 222.5 lb

## 2013-07-11 DIAGNOSIS — C9000 Multiple myeloma not having achieved remission: Secondary | ICD-10-CM

## 2013-07-11 DIAGNOSIS — M545 Low back pain, unspecified: Secondary | ICD-10-CM | POA: Insufficient documentation

## 2013-07-11 DIAGNOSIS — K589 Irritable bowel syndrome without diarrhea: Secondary | ICD-10-CM

## 2013-07-11 DIAGNOSIS — R52 Pain, unspecified: Secondary | ICD-10-CM

## 2013-07-11 DIAGNOSIS — R300 Dysuria: Secondary | ICD-10-CM

## 2013-07-11 DIAGNOSIS — Z5112 Encounter for antineoplastic immunotherapy: Secondary | ICD-10-CM

## 2013-07-11 DIAGNOSIS — I10 Essential (primary) hypertension: Secondary | ICD-10-CM

## 2013-07-11 DIAGNOSIS — C9002 Multiple myeloma in relapse: Secondary | ICD-10-CM

## 2013-07-11 DIAGNOSIS — M25559 Pain in unspecified hip: Secondary | ICD-10-CM | POA: Insufficient documentation

## 2013-07-11 DIAGNOSIS — R109 Unspecified abdominal pain: Secondary | ICD-10-CM

## 2013-07-11 DIAGNOSIS — G609 Hereditary and idiopathic neuropathy, unspecified: Secondary | ICD-10-CM

## 2013-07-11 LAB — COMPREHENSIVE METABOLIC PANEL (CC13)
ALBUMIN: 3.9 g/dL (ref 3.5–5.0)
ALK PHOS: 63 U/L (ref 40–150)
ALT: 42 U/L (ref 0–55)
AST: 12 U/L (ref 5–34)
Anion Gap: 12 mEq/L — ABNORMAL HIGH (ref 3–11)
BUN: 16.3 mg/dL (ref 7.0–26.0)
CO2: 25 mEq/L (ref 22–29)
CREATININE: 1.2 mg/dL (ref 0.7–1.3)
Calcium: 9.8 mg/dL (ref 8.4–10.4)
Chloride: 103 mEq/L (ref 98–109)
GLUCOSE: 162 mg/dL — AB (ref 70–140)
Potassium: 3.8 mEq/L (ref 3.5–5.1)
Sodium: 140 mEq/L (ref 136–145)
Total Bilirubin: 0.64 mg/dL (ref 0.20–1.20)
Total Protein: 7.8 g/dL (ref 6.4–8.3)

## 2013-07-11 LAB — CBC WITH DIFFERENTIAL/PLATELET
BASO%: 0.2 % (ref 0.0–2.0)
BASOS ABS: 0 10*3/uL (ref 0.0–0.1)
EOS ABS: 0 10*3/uL (ref 0.0–0.5)
EOS%: 0.1 % (ref 0.0–7.0)
HEMATOCRIT: 37.1 % — AB (ref 38.4–49.9)
HEMOGLOBIN: 12.3 g/dL — AB (ref 13.0–17.1)
LYMPH%: 9.1 % — AB (ref 14.0–49.0)
MCH: 29.9 pg (ref 27.2–33.4)
MCHC: 33.1 g/dL (ref 32.0–36.0)
MCV: 90.5 fL (ref 79.3–98.0)
MONO#: 0.1 10*3/uL (ref 0.1–0.9)
MONO%: 1.1 % (ref 0.0–14.0)
NEUT%: 89.5 % — AB (ref 39.0–75.0)
NEUTROS ABS: 7.3 10*3/uL — AB (ref 1.5–6.5)
PLATELETS: 297 10*3/uL (ref 140–400)
RBC: 4.1 10*6/uL — ABNORMAL LOW (ref 4.20–5.82)
RDW: 14.7 % — ABNORMAL HIGH (ref 11.0–14.6)
WBC: 8.2 10*3/uL (ref 4.0–10.3)
lymph#: 0.7 10*3/uL — ABNORMAL LOW (ref 0.9–3.3)

## 2013-07-11 MED ORDER — SODIUM CHLORIDE 0.9 % IJ SOLN
10.0000 mL | INTRAMUSCULAR | Status: DC | PRN
  Administered 2013-07-11: 10 mL
  Filled 2013-07-11: qty 10

## 2013-07-11 MED ORDER — SODIUM CHLORIDE 0.9 % IV SOLN
Freq: Once | INTRAVENOUS | Status: AC
  Administered 2013-07-11: 16:00:00 via INTRAVENOUS

## 2013-07-11 MED ORDER — HEPARIN SOD (PORK) LOCK FLUSH 100 UNIT/ML IV SOLN
500.0000 [IU] | Freq: Once | INTRAVENOUS | Status: AC | PRN
Start: 2013-07-11 — End: 2013-07-11
  Administered 2013-07-11: 500 [IU]
  Filled 2013-07-11: qty 5

## 2013-07-11 MED ORDER — DEXTROSE 5 % IV SOLN
27.0000 mg/m2 | Freq: Once | INTRAVENOUS | Status: AC
  Administered 2013-07-11: 60 mg via INTRAVENOUS
  Filled 2013-07-11: qty 30

## 2013-07-11 MED ORDER — ONDANSETRON 8 MG/50ML IVPB (CHCC)
8.0000 mg | Freq: Once | INTRAVENOUS | Status: AC
  Administered 2013-07-11: 8 mg via INTRAVENOUS

## 2013-07-11 NOTE — Progress Notes (Signed)
[facility] OFFICE PROGRESS NOTE   Diagnosis:  Multiple myeloma.  INTERVAL HISTORY:   [name] returns as scheduled. He feels he is tolerating treatment well. He denies nausea/vomiting. No mouth sores. No diarrhea.  He reports recent left low back and left groin pain. No known injury. The pain worsens in the left groin with certain twisting motions. He reports he has primary care provider and starting a course of prednisone. He has also noted recent discomfort involving the left testicle. No fever.  Objective:  Vital signs in last 24 hours:  Blood pressure 161/92, pulse 85, temperature 98.4 F (36.9 C), temperature source Oral, resp. rate 20, height 6' (1.829 m), weight 222 lb 8 oz (100.925 kg).    HEENT: No thrush or ulcerations Lymphatics: No palpable inguinal lymph nodes. Resp: Lungs clear. Cardio: Regular cardiac rhythm. GI: Soft and nontender. No hepatomegaly. Vascular: No leg edema. MSK: No palpable abnormality at the left groin. No skin rash. Pain reproducible at the low back and groin with internal and external rotation of the left hip. GU: Left testicle is tender. No palpable abnormality otherwise.   Lab Results:  Lab Results  Component Value Date   WBC 8.2 07/11/2013   HGB 12.3* 07/11/2013   HCT 37.1* 07/11/2013   MCV 90.5 07/11/2013   PLT 297 07/11/2013   NEUTROABS 7.3* 07/11/2013    Imaging:  No results found.  Medications: I have reviewed the patient's current medications.  Assessment/Plan: 1.Multiple myeloma, IgG lambda monoclonal protein  -Initial diagnosis 2008, bone marrow with a 40-50% plasma cells  -Lenalidomide 25 mg,day 1-21 and Decadron 40 mg weekly and 7106, complicated by peripheral neuropathy  -Therapy change to bortezomib IV twice weekly plus Decadron  -High-dose chemotherapy with autologous stem cell transplantation 2009 at [facility]  -Bortezomib, cyclophosphamide, and Decadron December 2012 through February 2014 (discontinued  secondary to pulmonary aspergillosis)  -January 2014 M spike rise to 1.1 g/dL from 0.1 g/dL in December 2013. Treatment started with Carlfilzomib, lenalidomide, and Decadron.  -treatment was restarted with Carlfilzomib, lenalidomide, and Decadron 08/16/2012.  -Restaging labs on 10/15/2012 consistent with improvement in the serum M. Protein.  -Continuation of Carfilzomib/Revlimid/dexamethasone.  -Continued improvement in the serum M spike and IgG level on 11/29/2012.  -Improvement in the serum M spike, slight increase in IgG on 01/03/2013.  -Improvement in the serum M spike, stable IgG, stable lambda free light chains 01/31/2013  -The IgG and serum M spike were slightly higher 02/28/2013, stable lambda light chains  -IgG, M spike, and lambda light chains slightly lower on 04/04/2013.  -Continuation of Carfilzomib/Revlimid/dexamethasone.  -IgG, M spike and lambda light chains stable 05/02/2013.  -Lambda light chains and serum M spike slightly increased on 06/13/2013, IgG stable. 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 He has been unable to lower the Duragesic patch dose  4. Irritable bowel syndrome  5. Hypertension  6. "Chest pain "/palpitations July 2014-evaluated by cardiology, scheduled for outpatient followup. Normal stress nuclear study 11/07/2012. He reports recurrent symptoms over the past month. He has an upcoming appointment with Dr. Acie Fredrickson. 7. Left low back and left groin pain. The pain is reproducible with internal/external rotation of the left hip. 8. Left testicle pain/tenderness.    Disposition: Plan to continue Carfilzomib/Revlimid/dexamethasone on the current schedule. We will followup on the myeloma labs from today.  Etiology of left low back and left groin pain unclear. Question referred pain from the hip. We will obtain plain films of  the left lower thoracic/lumbar spine and left hip.  He will followup with his  primary care provider regarding the testicular discomfort.  We will see him in followup on 08/08/2013. He will contact the office in the interim with any problems.   Plan reviewed with Dr. Benay Spice.    Owens Shark ANP/GNP-BC   07/11/2013  5:08 PM

## 2013-07-12 ENCOUNTER — Other Ambulatory Visit (HOSPITAL_BASED_OUTPATIENT_CLINIC_OR_DEPARTMENT_OTHER): Payer: BC Managed Care – PPO

## 2013-07-12 ENCOUNTER — Ambulatory Visit (HOSPITAL_BASED_OUTPATIENT_CLINIC_OR_DEPARTMENT_OTHER): Payer: BC Managed Care – PPO

## 2013-07-12 VITALS — BP 152/60 | HR 69 | Temp 98.2°F | Resp 18 | Ht 72.0 in

## 2013-07-12 DIAGNOSIS — C9002 Multiple myeloma in relapse: Secondary | ICD-10-CM

## 2013-07-12 DIAGNOSIS — C9000 Multiple myeloma not having achieved remission: Secondary | ICD-10-CM

## 2013-07-12 DIAGNOSIS — Z5112 Encounter for antineoplastic immunotherapy: Secondary | ICD-10-CM

## 2013-07-12 DIAGNOSIS — R300 Dysuria: Secondary | ICD-10-CM

## 2013-07-12 LAB — URINALYSIS, MICROSCOPIC - CHCC
Bilirubin (Urine): NEGATIVE
Blood: NEGATIVE
Glucose: NEGATIVE mg/dL
KETONES: NEGATIVE mg/dL
Leukocyte Esterase: NEGATIVE
Nitrite: NEGATIVE
PROTEIN: NEGATIVE mg/dL
RBC / HPF: NEGATIVE (ref 0–2)
Specific Gravity, Urine: 1.015 (ref 1.003–1.035)
Urobilinogen, UR: 0.2 mg/dL (ref 0.2–1)
pH: 6 (ref 4.6–8.0)

## 2013-07-12 LAB — KAPPA/LAMBDA LIGHT CHAINS
KAPPA LAMBDA RATIO: 0 — AB (ref 0.26–1.65)
Kappa free light chain: 0.03 mg/dL — ABNORMAL LOW (ref 0.33–1.94)
LAMBDA FREE LGHT CHN: 13.8 mg/dL — AB (ref 0.57–2.63)

## 2013-07-12 MED ORDER — HEPARIN SOD (PORK) LOCK FLUSH 100 UNIT/ML IV SOLN
500.0000 [IU] | Freq: Once | INTRAVENOUS | Status: AC | PRN
  Administered 2013-07-12: 500 [IU]
  Filled 2013-07-12: qty 5

## 2013-07-12 MED ORDER — SODIUM CHLORIDE 0.9 % IV SOLN
Freq: Once | INTRAVENOUS | Status: AC
  Administered 2013-07-12: 16:00:00 via INTRAVENOUS

## 2013-07-12 MED ORDER — SODIUM CHLORIDE 0.9 % IJ SOLN
10.0000 mL | INTRAMUSCULAR | Status: DC | PRN
  Administered 2013-07-12: 10 mL
  Filled 2013-07-12: qty 10

## 2013-07-12 MED ORDER — CARFILZOMIB CHEMO INJECTION 60 MG
27.0000 mg/m2 | Freq: Once | INTRAVENOUS | Status: AC
  Administered 2013-07-12: 60 mg via INTRAVENOUS
  Filled 2013-07-12: qty 30

## 2013-07-12 MED ORDER — ONDANSETRON 8 MG/50ML IVPB (CHCC)
8.0000 mg | Freq: Once | INTRAVENOUS | Status: AC
  Administered 2013-07-12: 8 mg via INTRAVENOUS

## 2013-07-12 MED ORDER — ONDANSETRON 8 MG/NS 50 ML IVPB
INTRAVENOUS | Status: AC
  Filled 2013-07-12: qty 8

## 2013-07-12 NOTE — Patient Instructions (Signed)
[facility] Discharge Instructions for Patients Receiving Chemotherapy  Today you received the following chemotherapy agents kyprolis  To help prevent nausea and vomiting after your treatment, we encourage you to take your nausea medication as needed   If you develop nausea and vomiting that is not controlled by your nausea medication, call the clinic.   BELOW ARE SYMPTOMS THAT SHOULD BE REPORTED IMMEDIATELY:  *FEVER GREATER THAN 100.5 F  *CHILLS WITH OR WITHOUT FEVER  NAUSEA AND VOMITING THAT IS NOT CONTROLLED WITH YOUR NAUSEA MEDICATION  *UNUSUAL SHORTNESS OF BREATH  *UNUSUAL BRUISING OR BLEEDING  TENDERNESS IN MOUTH AND THROAT WITH OR WITHOUT PRESENCE OF ULCERS  *URINARY PROBLEMS  *BOWEL PROBLEMS  UNUSUAL RASH Items with * indicate a potential emergency and should be followed up as soon as possible.  Feel free to call the clinic you have any questions or concerns. The clinic phone number is [phone].    

## 2013-07-13 ENCOUNTER — Telehealth: Payer: Self-pay | Admitting: Oncology

## 2013-07-13 NOTE — Telephone Encounter (Signed)
S/w the pt and he is aware of his appt on 07/18/2013 and he is aware to pick up the rest of his appt schedules at that time. Sent michelle a staff message to add the chemo appts.

## 2013-07-14 ENCOUNTER — Other Ambulatory Visit: Payer: Self-pay | Admitting: Oncology

## 2013-07-15 ENCOUNTER — Other Ambulatory Visit: Payer: BC Managed Care – PPO

## 2013-07-15 ENCOUNTER — Telehealth: Payer: Self-pay | Admitting: *Deleted

## 2013-07-15 LAB — PROTEIN ELECTROPHORESIS, SERUM
ALPHA-2-GLOBULIN: 8.3 % (ref 7.1–11.8)
Albumin ELP: 57.6 % (ref 55.8–66.1)
Alpha-1-Globulin: 3.8 % (ref 2.9–4.9)
BETA GLOBULIN: 6.5 % (ref 4.7–7.2)
Beta 2: 21.5 % — ABNORMAL HIGH (ref 3.2–6.5)
Gamma Globulin: 2.3 % — ABNORMAL LOW (ref 11.1–18.8)
M-SPIKE, %: 0.98 g/dL
TOTAL PROTEIN, SERUM ELECTROPHOR: 7.4 g/dL (ref 6.0–8.3)

## 2013-07-15 LAB — IGG: IGG (IMMUNOGLOBIN G), SERUM: 1520 mg/dL (ref 650–1600)

## 2013-07-15 NOTE — Telephone Encounter (Signed)
Called and informed patient of negative urinalysis.  Per Walter Dennis. [name].  Patient verbalized understanding.

## 2013-07-15 NOTE — Telephone Encounter (Signed)
Per staff message and POF I have scheduled appts.  JMW  

## 2013-07-15 NOTE — Telephone Encounter (Signed)
Left Message for patient to call back regarding results.

## 2013-07-15 NOTE — Telephone Encounter (Signed)
Message copied by Norma Fredrickson on Mon Jul 15, 2013  4:07 PM ------      Message from: [place], [place] K      Created: Mon Jul 15, 2013  4:00 PM       Please let him know urinalysis was negative.      ----- Message -----         From: [facility]         Sent: 07/12/2013   3:37 PM           To: Owens Shark, NP                   ------

## 2013-07-18 ENCOUNTER — Other Ambulatory Visit (HOSPITAL_BASED_OUTPATIENT_CLINIC_OR_DEPARTMENT_OTHER): Payer: BC Managed Care – PPO

## 2013-07-18 ENCOUNTER — Ambulatory Visit (HOSPITAL_BASED_OUTPATIENT_CLINIC_OR_DEPARTMENT_OTHER): Payer: BC Managed Care – PPO

## 2013-07-18 VITALS — BP 131/76 | HR 105 | Temp 98.3°F | Resp 18

## 2013-07-18 DIAGNOSIS — C9002 Multiple myeloma in relapse: Secondary | ICD-10-CM

## 2013-07-18 DIAGNOSIS — C9000 Multiple myeloma not having achieved remission: Secondary | ICD-10-CM

## 2013-07-18 DIAGNOSIS — Z5112 Encounter for antineoplastic immunotherapy: Secondary | ICD-10-CM

## 2013-07-18 LAB — CBC WITH DIFFERENTIAL/PLATELET
BASO%: 0.3 % (ref 0.0–2.0)
Basophils Absolute: 0 10*3/uL (ref 0.0–0.1)
EOS%: 0.3 % (ref 0.0–7.0)
Eosinophils Absolute: 0 10*3/uL (ref 0.0–0.5)
HCT: 38.7 % (ref 38.4–49.9)
HGB: 12.9 g/dL — ABNORMAL LOW (ref 13.0–17.1)
LYMPH%: 8.4 % — ABNORMAL LOW (ref 14.0–49.0)
MCH: 29.7 pg (ref 27.2–33.4)
MCHC: 33.2 g/dL (ref 32.0–36.0)
MCV: 89.4 fL (ref 79.3–98.0)
MONO#: 0.1 10*3/uL (ref 0.1–0.9)
MONO%: 1.7 % (ref 0.0–14.0)
NEUT%: 89.3 % — ABNORMAL HIGH (ref 39.0–75.0)
NEUTROS ABS: 7.9 10*3/uL — AB (ref 1.5–6.5)
Platelets: 256 10*3/uL (ref 140–400)
RBC: 4.33 10*6/uL (ref 4.20–5.82)
RDW: 14.6 % (ref 11.0–14.6)
WBC: 8.8 10*3/uL (ref 4.0–10.3)
lymph#: 0.7 10*3/uL — ABNORMAL LOW (ref 0.9–3.3)

## 2013-07-18 MED ORDER — DEXTROSE 5 % IV SOLN
27.0000 mg/m2 | Freq: Once | INTRAVENOUS | Status: AC
  Administered 2013-07-18: 60 mg via INTRAVENOUS
  Filled 2013-07-18: qty 30

## 2013-07-18 MED ORDER — SODIUM CHLORIDE 0.9 % IV SOLN
Freq: Once | INTRAVENOUS | Status: AC
  Administered 2013-07-18: 15:00:00 via INTRAVENOUS

## 2013-07-18 MED ORDER — ONDANSETRON 8 MG/NS 50 ML IVPB
INTRAVENOUS | Status: AC
  Filled 2013-07-18: qty 8

## 2013-07-18 MED ORDER — SODIUM CHLORIDE 0.9 % IJ SOLN
10.0000 mL | INTRAMUSCULAR | Status: DC | PRN
  Administered 2013-07-18: 10 mL
  Filled 2013-07-18: qty 10

## 2013-07-18 MED ORDER — HEPARIN SOD (PORK) LOCK FLUSH 100 UNIT/ML IV SOLN
500.0000 [IU] | Freq: Once | INTRAVENOUS | Status: AC | PRN
  Administered 2013-07-18: 500 [IU]
  Filled 2013-07-18: qty 5

## 2013-07-18 MED ORDER — ONDANSETRON 8 MG/50ML IVPB (CHCC)
8.0000 mg | Freq: Once | INTRAVENOUS | Status: AC
  Administered 2013-07-18: 8 mg via INTRAVENOUS

## 2013-07-18 NOTE — Patient Instructions (Signed)
[facility] Discharge Instructions for Patients Receiving Chemotherapy  Today you received the following chemotherapy agents Kyprolis To help prevent nausea and vomiting after your treatment, we encourage you to take your nausea medication as prescribed.  If you develop nausea and vomiting that is not controlled by your nausea medication, call the clinic.   BELOW ARE SYMPTOMS THAT SHOULD BE REPORTED IMMEDIATELY:  *FEVER GREATER THAN 100.5 F  *CHILLS WITH OR WITHOUT FEVER  NAUSEA AND VOMITING THAT IS NOT CONTROLLED WITH YOUR NAUSEA MEDICATION  *UNUSUAL SHORTNESS OF BREATH  *UNUSUAL BRUISING OR BLEEDING  TENDERNESS IN MOUTH AND THROAT WITH OR WITHOUT PRESENCE OF ULCERS  *URINARY PROBLEMS  *BOWEL PROBLEMS  UNUSUAL RASH Items with * indicate a potential emergency and should be followed up as soon as possible.  Feel free to call the clinic you have any questions or concerns. The clinic phone number is [phone].    

## 2013-07-19 ENCOUNTER — Ambulatory Visit (HOSPITAL_BASED_OUTPATIENT_CLINIC_OR_DEPARTMENT_OTHER): Payer: BC Managed Care – PPO

## 2013-07-19 VITALS — BP 127/56 | HR 96 | Temp 97.7°F

## 2013-07-19 DIAGNOSIS — Z5112 Encounter for antineoplastic immunotherapy: Secondary | ICD-10-CM

## 2013-07-19 DIAGNOSIS — C9002 Multiple myeloma in relapse: Secondary | ICD-10-CM

## 2013-07-19 DIAGNOSIS — C9000 Multiple myeloma not having achieved remission: Secondary | ICD-10-CM

## 2013-07-19 MED ORDER — SODIUM CHLORIDE 0.9 % IJ SOLN
10.0000 mL | INTRAMUSCULAR | Status: DC | PRN
Start: 2013-07-19 — End: 2013-07-19
  Administered 2013-07-19: 10 mL
  Filled 2013-07-19: qty 10

## 2013-07-19 MED ORDER — ONDANSETRON 8 MG/50ML IVPB (CHCC)
8.0000 mg | Freq: Once | INTRAVENOUS | Status: AC
Start: 2013-07-19 — End: 2013-07-19
  Administered 2013-07-19: 8 mg via INTRAVENOUS

## 2013-07-19 MED ORDER — SODIUM CHLORIDE 0.9 % IV SOLN
Freq: Once | INTRAVENOUS | Status: AC
  Administered 2013-07-19: 15:00:00 via INTRAVENOUS

## 2013-07-19 MED ORDER — DEXTROSE 5 % IV SOLN
27.0000 mg/m2 | Freq: Once | INTRAVENOUS | Status: AC
  Administered 2013-07-19: 60 mg via INTRAVENOUS
  Filled 2013-07-19: qty 30

## 2013-07-19 MED ORDER — ONDANSETRON 8 MG/NS 50 ML IVPB
INTRAVENOUS | Status: AC
  Filled 2013-07-19: qty 8

## 2013-07-19 MED ORDER — HEPARIN SOD (PORK) LOCK FLUSH 100 UNIT/ML IV SOLN
500.0000 [IU] | Freq: Once | INTRAVENOUS | Status: AC | PRN
  Administered 2013-07-19: 500 [IU]
  Filled 2013-07-19: qty 5

## 2013-07-19 NOTE — Patient Instructions (Signed)
[facility] Discharge Instructions for Patients Receiving Chemotherapy  Today you received the following chemotherapy agents Kyprolis To help prevent nausea and vomiting after your treatment, we encourage you to take your nausea medication as prescribed.  If you develop nausea and vomiting that is not controlled by your nausea medication, call the clinic.   BELOW ARE SYMPTOMS THAT SHOULD BE REPORTED IMMEDIATELY:  *FEVER GREATER THAN 100.5 F  *CHILLS WITH OR WITHOUT FEVER  NAUSEA AND VOMITING THAT IS NOT CONTROLLED WITH YOUR NAUSEA MEDICATION  *UNUSUAL SHORTNESS OF BREATH  *UNUSUAL BRUISING OR BLEEDING  TENDERNESS IN MOUTH AND THROAT WITH OR WITHOUT PRESENCE OF ULCERS  *URINARY PROBLEMS  *BOWEL PROBLEMS  UNUSUAL RASH Items with * indicate a potential emergency and should be followed up as soon as possible.  Feel free to call the clinic you have any questions or concerns. The clinic phone number is [phone].    

## 2013-07-20 ENCOUNTER — Other Ambulatory Visit: Payer: Self-pay | Admitting: Oncology

## 2013-07-21 ENCOUNTER — Other Ambulatory Visit: Payer: Self-pay | Admitting: Oncology

## 2013-07-23 ENCOUNTER — Telehealth: Payer: Self-pay | Admitting: *Deleted

## 2013-07-23 ENCOUNTER — Other Ambulatory Visit: Payer: Self-pay | Admitting: *Deleted

## 2013-07-23 DIAGNOSIS — C9000 Multiple myeloma not having achieved remission: Secondary | ICD-10-CM

## 2013-07-23 MED ORDER — LENALIDOMIDE 10 MG PO CAPS: 10.0000 mg | ORAL_CAPSULE | Freq: Every day | ORAL | Status: DC

## 2013-07-23 NOTE — Telephone Encounter (Signed)
Called pt and instructed pt to call [facility] to take survey for Revlimid refill.  Gave pt [facility] phone number.

## 2013-07-24 ENCOUNTER — Ambulatory Visit (INDEPENDENT_AMBULATORY_CARE_PROVIDER_SITE_OTHER): Payer: BC Managed Care – PPO | Admitting: Cardiovascular Disease

## 2013-07-24 ENCOUNTER — Encounter: Payer: Self-pay | Admitting: Cardiovascular Disease

## 2013-07-24 VITALS — BP 131/73 | HR 102 | Ht 72.0 in | Wt 223.6 lb

## 2013-07-24 DIAGNOSIS — R0989 Other specified symptoms and signs involving the circulatory and respiratory systems: Secondary | ICD-10-CM

## 2013-07-24 DIAGNOSIS — R0609 Other forms of dyspnea: Secondary | ICD-10-CM

## 2013-07-24 DIAGNOSIS — R079 Chest pain, unspecified: Secondary | ICD-10-CM | POA: Insufficient documentation

## 2013-07-24 DIAGNOSIS — R0600 Dyspnea, unspecified: Secondary | ICD-10-CM

## 2013-07-24 NOTE — Progress Notes (Signed)
Walter Dennis Date of Birth  03/27/1950       [facility] Office [address], [place], [place]  [address]   [place], [place]  [address] [address]   Fax  [phone]     Fax [phone]  Problem List: 1. Multiple myeloma 2. Chest pain-negative stress Myoview study in July, 2014  History of Present Illness:   Walter Dennis has been seen in the past for exertional CP ( in July 2014).  His myoview was negative.    He now presents with the same pain chest burning with exertion, associated with dyspnea.  He has to stop and rest to catch his breath.    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   He had a heart cath ~ 2012 which was normal.   Current Outpatient Prescriptions on File Prior to Visit  Medication Sig Dispense Refill  . acyclovir (ZOVIRAX) 400 MG tablet Take 400 mg by mouth every 12 (twelve) hours. Take 1 tablet by mouth every 12 hours to prevent shingles while on chemo      . aspirin 81 MG chewable tablet Chew 81 mg by mouth daily.      . carvedilol (COREG) 6.25 MG tablet Take 6.25 mg by mouth every morning.      Marland Kitchen dexamethasone (DECADRON) 4 MG tablet Take 5 tablets (20 mg total) by mouth once a week. Once weekly on Thursday.  20 tablet  2  . fentaNYL (DURAGESIC - DOSED MCG/HR) 25 MCG/HR patch Place 1 patch (25 mcg total) onto the skin every 3 (three) days.  10 patch  0  . furosemide (LASIX) 40 MG tablet TAKE 1 TABLET BY MOUTH TWICE A DAY AS NEEDED FOR EDEMA  60 tablet  1  . HYDROcodone-acetaminophen (NORCO) 10-325 MG per tablet Take 1 tablet by mouth every 6 (six) hours as needed for pain.  60 tablet  0  . lenalidomide (REVLIMID) 10 MG capsule Take 1 capsule (10 mg total) by mouth at bedtime. For 21 days, then 7 days off.  21 capsule  0  . NEXIUM 40 MG capsule TAKE ONE CAPSULE EVERY DAY  90 capsule  0  . nitroGLYCERIN (NITROSTAT) 0.3 MG SL tablet Place 1 tablet (0.3 mg total) under the tongue every 5  (five) minutes as needed for chest pain. No more than 3 doses in period of 15 minutes per episode.  90 tablet  1  . ondansetron (ZOFRAN) 8 MG tablet Take 1 tablet (8 mg total) by mouth every 12 (twelve) hours as needed for nausea.  30 tablet  1  . pregabalin (LYRICA) 50 MG capsule Take 1 capsule (50 mg total) by mouth 2 (two) times daily.  60 capsule  3  . PRESCRIPTION MEDICATION Inject 60 mg into the vein once. carfilzomib (KYPROLIS) 60 mg in dextrose 5 % 50 mL chemo infusion 27 mg/m2  2.2 m2 (Treatment Plan Actual) Once 09/27/2012      . valsartan (DIOVAN) 80 MG tablet Take 1 tablet (80 mg total) by mouth daily.  90 tablet  0   No current facility-administered medications on file prior to visit.    No Known Allergies  Past Medical History  Diagnosis Date  . Hypertension   . Cancer     Past Surgical History  Procedure Laterality Date  . Portacath placement      right chest    History  Smoking status  . Former Smoker  . Quit date: 09/29/2002  Smokeless tobacco  . Never Used    History  Alcohol Use No    No family history on file.  Reviw of Systems:  Reviewed in the HPI.  All other systems are negative.  Physical Exam: Blood pressure 131/73, pulse 102, height 6' (1.829 m), weight 223 lb 9.6 oz (101.424 kg). Wt Readings from Last 3 Encounters:  07/24/13 223 lb 9.6 oz (101.424 kg)  07/11/13 222 lb 8 oz (100.925 kg)  06/13/13 220 lb 6.4 oz (99.973 kg)     General: Well developed, well nourished, in no acute distress.  Head: Normocephalic, atraumatic, sclera non-icteric, mucus membranes are moist,   Neck: Supple. Carotids are 2 + without bruits. No JVD   Lungs: Clear   Heart: RR, normal s1s2  Abdomen: Soft, non-tender, non-distended with normal bowel sounds.  Msk:  Strength and tone are normal   Extremities: No clubbing or cyanosis. No edema.  Distal pedal pulses are 2+ and equal    Neuro: CN II - XII intact.  Alert and oriented X 3.   Psych:  Normal    ECG: Jul 22, 2013:  Sinus tach at 102.  Otherwise normal  Assessment / Plan:

## 2013-07-24 NOTE — Assessment & Plan Note (Addendum)
Walter Dennis presents today for further evaluation of his chest pain. He states has been off and on for many years. He had a negative heart catheterization in 2000 well. He was admitted to the hospital last year for chest discomfort and a stress Myoview study was normal. He now presents to the office with continued chest burning. These episodes occur primarily with exertion. This issue with shortness of breath. Earlier this week he had to stop what he was doing and rest to allow the chest pains/10 chest tightness that resolved.  At this point I think we should repeat his stress Myoview study. We will have him see pulmonary. Dr. Gearldine Shown office will make that referral. I'll see him back in about 3 months.

## 2013-07-24 NOTE — Patient Instructions (Signed)
Your physician has requested that you have en exercise stress myoview. For further information please visit [email or web address]. Please follow instruction sheet, as given.  Your physician recommends that you schedule a follow-up appointment in: [place]. Acie Fredrickson.

## 2013-07-25 ENCOUNTER — Other Ambulatory Visit: Payer: BC Managed Care – PPO

## 2013-07-25 ENCOUNTER — Ambulatory Visit: Payer: BC Managed Care – PPO

## 2013-07-26 ENCOUNTER — Ambulatory Visit (HOSPITAL_BASED_OUTPATIENT_CLINIC_OR_DEPARTMENT_OTHER): Payer: BC Managed Care – PPO

## 2013-07-26 ENCOUNTER — Other Ambulatory Visit: Payer: Self-pay | Admitting: Medical Oncology

## 2013-07-26 ENCOUNTER — Other Ambulatory Visit (HOSPITAL_BASED_OUTPATIENT_CLINIC_OR_DEPARTMENT_OTHER): Payer: BC Managed Care – PPO

## 2013-07-26 VITALS — BP 131/65 | HR 70 | Temp 98.3°F | Resp 18

## 2013-07-26 DIAGNOSIS — C9000 Multiple myeloma not having achieved remission: Secondary | ICD-10-CM

## 2013-07-26 DIAGNOSIS — C9002 Multiple myeloma in relapse: Secondary | ICD-10-CM

## 2013-07-26 DIAGNOSIS — Z5112 Encounter for antineoplastic immunotherapy: Secondary | ICD-10-CM

## 2013-07-26 LAB — CBC WITH DIFFERENTIAL/PLATELET
BASO%: 0.4 % (ref 0.0–2.0)
Basophils Absolute: 0 10*3/uL (ref 0.0–0.1)
EOS ABS: 0.2 10*3/uL (ref 0.0–0.5)
EOS%: 2.1 % (ref 0.0–7.0)
HCT: 36.4 % — ABNORMAL LOW (ref 38.4–49.9)
HGB: 12 g/dL — ABNORMAL LOW (ref 13.0–17.1)
LYMPH%: 13.2 % — ABNORMAL LOW (ref 14.0–49.0)
MCH: 29.6 pg (ref 27.2–33.4)
MCHC: 33.1 g/dL (ref 32.0–36.0)
MCV: 89.5 fL (ref 79.3–98.0)
MONO#: 0.6 10*3/uL (ref 0.1–0.9)
MONO%: 5.2 % (ref 0.0–14.0)
NEUT#: 9.1 10*3/uL — ABNORMAL HIGH (ref 1.5–6.5)
NEUT%: 79.1 % — ABNORMAL HIGH (ref 39.0–75.0)
Platelets: 276 10*3/uL (ref 140–400)
RBC: 4.07 10*6/uL — AB (ref 4.20–5.82)
RDW: 14.8 % — AB (ref 11.0–14.6)
WBC: 11.6 10*3/uL — AB (ref 4.0–10.3)
lymph#: 1.5 10*3/uL (ref 0.9–3.3)

## 2013-07-26 MED ORDER — SODIUM CHLORIDE 0.9 % IJ SOLN
10.0000 mL | INTRAMUSCULAR | Status: DC | PRN
  Administered 2013-07-26: 10 mL
  Filled 2013-07-26: qty 10

## 2013-07-26 MED ORDER — DEXTROSE 5 % IV SOLN
27.0000 mg/m2 | Freq: Once | INTRAVENOUS | Status: AC
  Administered 2013-07-26: 60 mg via INTRAVENOUS
  Filled 2013-07-26: qty 30

## 2013-07-26 MED ORDER — SODIUM CHLORIDE 0.9 % IV SOLN
Freq: Once | INTRAVENOUS | Status: AC
  Administered 2013-07-26: 16:00:00 via INTRAVENOUS

## 2013-07-26 MED ORDER — FENTANYL 25 MCG/HR TD PT72: 25.0000 ug | MEDICATED_PATCH | TRANSDERMAL | Status: DC

## 2013-07-26 MED ORDER — HEPARIN SOD (PORK) LOCK FLUSH 100 UNIT/ML IV SOLN
500.0000 [IU] | Freq: Once | INTRAVENOUS | Status: AC | PRN
  Administered 2013-07-26: 500 [IU]
  Filled 2013-07-26: qty 5

## 2013-07-26 MED ORDER — ONDANSETRON 8 MG/NS 50 ML IVPB
INTRAVENOUS | Status: AC
  Filled 2013-07-26: qty 8

## 2013-07-26 MED ORDER — ONDANSETRON 8 MG/50ML IVPB (CHCC)
8.0000 mg | Freq: Once | INTRAVENOUS | Status: AC
  Administered 2013-07-26: 8 mg via INTRAVENOUS

## 2013-07-26 NOTE — Patient Instructions (Signed)
[facility] Discharge Instructions for Patients Receiving Chemotherapy  Today you received the following chemotherapy agents Kyprolis.  To help prevent nausea and vomiting after your treatment, we encourage you to take your nausea medication.   If you develop nausea and vomiting that is not controlled by your nausea medication, call the clinic.   BELOW ARE SYMPTOMS THAT SHOULD BE REPORTED IMMEDIATELY:  *FEVER GREATER THAN 100.5 F  *CHILLS WITH OR WITHOUT FEVER  NAUSEA AND VOMITING THAT IS NOT CONTROLLED WITH YOUR NAUSEA MEDICATION  *UNUSUAL SHORTNESS OF BREATH  *UNUSUAL BRUISING OR BLEEDING  TENDERNESS IN MOUTH AND THROAT WITH OR WITHOUT PRESENCE OF ULCERS  *URINARY PROBLEMS  *BOWEL PROBLEMS  UNUSUAL RASH Items with * indicate a potential emergency and should be followed up as soon as possible.  Feel free to call the clinic you have any questions or concerns. The clinic phone number is [phone].    

## 2013-08-05 ENCOUNTER — Other Ambulatory Visit: Payer: Self-pay | Admitting: Oncology

## 2013-08-07 ENCOUNTER — Ambulatory Visit (HOSPITAL_COMMUNITY): Payer: BC Managed Care – PPO | Attending: Cardiovascular Disease | Admitting: Radiology

## 2013-08-07 VITALS — BP 138/83 | HR 59 | Ht 72.0 in | Wt 215.0 lb

## 2013-08-07 DIAGNOSIS — R0989 Other specified symptoms and signs involving the circulatory and respiratory systems: Secondary | ICD-10-CM | POA: Insufficient documentation

## 2013-08-07 DIAGNOSIS — R0609 Other forms of dyspnea: Secondary | ICD-10-CM | POA: Insufficient documentation

## 2013-08-07 DIAGNOSIS — R0602 Shortness of breath: Secondary | ICD-10-CM

## 2013-08-07 DIAGNOSIS — R079 Chest pain, unspecified: Secondary | ICD-10-CM | POA: Insufficient documentation

## 2013-08-07 DIAGNOSIS — R0600 Dyspnea, unspecified: Secondary | ICD-10-CM

## 2013-08-07 MED ORDER — TECHNETIUM TC 99M SESTAMIBI GENERIC - CARDIOLITE
10.0000 | Freq: Once | INTRAVENOUS | Status: AC | PRN
  Administered 2013-08-07: 10 via INTRAVENOUS

## 2013-08-07 MED ORDER — TECHNETIUM TC 99M SESTAMIBI GENERIC - CARDIOLITE
30.0000 | Freq: Once | INTRAVENOUS | Status: AC | PRN
  Administered 2013-08-07: 30 via INTRAVENOUS

## 2013-08-07 NOTE — Progress Notes (Signed)
[facility] [address] [place], [place] [address] [phone]    Cardiology Nuclear Med Study  Walter Dennis is a 63 y.o. male     MRN : [id number]     DOB: [birth year]  Procedure Date: 08/07/2013  Nuclear Med Background Indication for Stress Test:  Evaluation for Ischemia History:  No known CAD, Cath (normal), MPI 2014 (normal) EF 66% Cardiac Risk Factors: History of Smoking and Hypertension  Symptoms:  Chest Pain (last date of chest discomfort was two weeks ago) and DOE   Nuclear Pre-Procedure Caffeine/Decaff Intake:  None NPO After: 6 pm   Lungs:  clear O2 Sat: 99% on room air. IV 0.9% NS with Angio Cath:  22g  IV Site: R Antecubital  IV Started by:  Crissie Figures, RN  Chest Size (in):  48 Cup Size: n/a  Height: 6' (1.829 m)  Weight:  215 lb (97.523 kg)  BMI:  Body mass index is 29.15 kg/(m^2). Tech Comments:  N/A    Nuclear Med Study 1 or 2 day study: 1 day  Stress Test Type:  Stress  Reading MD: N/A  Order Authorizing Provider:  Mertie Moores, MD  Resting Radionuclide: Technetium 85m Sestamibi  Resting Radionuclide Dose: 11.0 mCi   Stress Radionuclide:  Technetium 57m Sestamibi  Stress Radionuclide Dose: 33.0 mCi           Stress Protocol Rest HR: 59 Stress HR: 139  Rest BP: 138/83 Stress BP: 155/76  Exercise Time (min): 7:15 METS: 8.8           Dose of Adenosine (mg):  n/a Dose of Lexiscan: n/a mg  Dose of Atropine (mg): n/a Dose of Dobutamine: n/a mcg/kg/min (at max HR)  Stress Test Technologist: Glade Lloyd, BS-ES  Nuclear Technologist:  Charlton Amor, CNMT     Rest Procedure:  Myocardial perfusion imaging was performed at rest 45 minutes following the intravenous administration of Technetium 64m Sestamibi. Rest ECG: NSR - Normal EKG  Stress Procedure:  The patient exercised on the treadmill utilizing the Bruce Protocol for 7:15 minutes. The patient stopped due to SOB and chest burning 5/10 and denied any chest pain.   Technetium 87m Sestamibi was injected at peak exercise and myocardial perfusion imaging was performed after a brief delay.  His chest burning resolved almost immediately in recovery and the SOB began to decrease in severity.  Stress ECG: No significant change from baseline ECG  QPS Raw Data Images:  Normal; no motion artifact; normal heart/lung ratio. Stress Images:  Normal homogeneous uptake in all areas of the myocardium. Rest Images:  Normal homogeneous uptake in all areas of the myocardium. Subtraction (SDS):  No evidence of ischemia. Transient Ischemic Dilatation (Normal <1.22):  0.97 Lung/Heart Ratio (Normal <0.45):  0.41  Quantitative Gated Spect Images QGS EDV:  110 ml QGS ESV:  51 ml  Impression Exercise Capacity:  Good exercise capacity. BP Response:  Hypertensive blood pressure response. Clinical Symptoms:  Mild chest pain/dyspnea. ECG Impression:  No significant ST segment change suggestive of ischemia. Comparison with Prior Nuclear Study: No significant change from previous study  Overall Impression:  Normal stress nuclear study.  LV Ejection Fraction: 54%.  LV Wall Motion:  NL LV Function; NL Wall Motion  Dorothy Spark 08/07/2013

## 2013-08-08 ENCOUNTER — Ambulatory Visit (HOSPITAL_BASED_OUTPATIENT_CLINIC_OR_DEPARTMENT_OTHER): Payer: BC Managed Care – PPO | Admitting: Oncology

## 2013-08-08 ENCOUNTER — Other Ambulatory Visit (HOSPITAL_BASED_OUTPATIENT_CLINIC_OR_DEPARTMENT_OTHER): Payer: BC Managed Care – PPO

## 2013-08-08 ENCOUNTER — Telehealth: Payer: Self-pay | Admitting: Nurse Practitioner

## 2013-08-08 ENCOUNTER — Ambulatory Visit (HOSPITAL_BASED_OUTPATIENT_CLINIC_OR_DEPARTMENT_OTHER): Payer: BC Managed Care – PPO

## 2013-08-08 VITALS — BP 149/62 | HR 71 | Temp 98.4°F | Resp 18 | Ht 72.0 in | Wt 223.5 lb

## 2013-08-08 DIAGNOSIS — C9000 Multiple myeloma not having achieved remission: Secondary | ICD-10-CM

## 2013-08-08 DIAGNOSIS — G609 Hereditary and idiopathic neuropathy, unspecified: Secondary | ICD-10-CM

## 2013-08-08 DIAGNOSIS — C9002 Multiple myeloma in relapse: Secondary | ICD-10-CM

## 2013-08-08 DIAGNOSIS — Z5112 Encounter for antineoplastic immunotherapy: Secondary | ICD-10-CM

## 2013-08-08 LAB — CBC WITH DIFFERENTIAL/PLATELET
BASO%: 1.6 % (ref 0.0–2.0)
Basophils Absolute: 0.1 10*3/uL (ref 0.0–0.1)
EOS ABS: 0.1 10*3/uL (ref 0.0–0.5)
EOS%: 1.8 % (ref 0.0–7.0)
HEMATOCRIT: 36.4 % — AB (ref 38.4–49.9)
HGB: 12.2 g/dL — ABNORMAL LOW (ref 13.0–17.1)
LYMPH#: 0.8 10*3/uL — AB (ref 0.9–3.3)
LYMPH%: 15 % (ref 14.0–49.0)
MCH: 29.3 pg (ref 27.2–33.4)
MCHC: 33.5 g/dL (ref 32.0–36.0)
MCV: 87.3 fL (ref 79.3–98.0)
MONO#: 0.1 10*3/uL (ref 0.1–0.9)
MONO%: 1.8 % (ref 0.0–14.0)
NEUT#: 4.1 10*3/uL (ref 1.5–6.5)
NEUT%: 79.8 % — AB (ref 39.0–75.0)
PLATELETS: 208 10*3/uL (ref 140–400)
RBC: 4.17 10*6/uL — ABNORMAL LOW (ref 4.20–5.82)
RDW: 14.6 % (ref 11.0–14.6)
WBC: 5.1 10*3/uL (ref 4.0–10.3)

## 2013-08-08 LAB — COMPREHENSIVE METABOLIC PANEL (CC13)
ALT: 32 U/L (ref 0–55)
ANION GAP: 13 meq/L — AB (ref 3–11)
AST: 13 U/L (ref 5–34)
Albumin: 3.8 g/dL (ref 3.5–5.0)
Alkaline Phosphatase: 58 U/L (ref 40–150)
BUN: 15.8 mg/dL (ref 7.0–26.0)
CALCIUM: 9.1 mg/dL (ref 8.4–10.4)
CHLORIDE: 105 meq/L (ref 98–109)
CO2: 21 meq/L — AB (ref 22–29)
Creatinine: 1.1 mg/dL (ref 0.7–1.3)
GLUCOSE: 165 mg/dL — AB (ref 70–140)
Potassium: 4.1 mEq/L (ref 3.5–5.1)
Sodium: 139 mEq/L (ref 136–145)
TOTAL PROTEIN: 7.5 g/dL (ref 6.4–8.3)
Total Bilirubin: 0.79 mg/dL (ref 0.20–1.20)

## 2013-08-08 LAB — TECHNOLOGIST REVIEW

## 2013-08-08 MED ORDER — HEPARIN SOD (PORK) LOCK FLUSH 100 UNIT/ML IV SOLN
500.0000 [IU] | Freq: Once | INTRAVENOUS | Status: AC | PRN
  Administered 2013-08-08: 500 [IU]
  Filled 2013-08-08: qty 5

## 2013-08-08 MED ORDER — SODIUM CHLORIDE 0.9 % IV SOLN
Freq: Once | INTRAVENOUS | Status: AC
  Administered 2013-08-08: 14:00:00 via INTRAVENOUS

## 2013-08-08 MED ORDER — SODIUM CHLORIDE 0.9 % IJ SOLN
10.0000 mL | INTRAMUSCULAR | Status: DC | PRN
  Administered 2013-08-08: 10 mL
  Filled 2013-08-08: qty 10

## 2013-08-08 MED ORDER — DEXTROSE 5 % IV SOLN
27.0000 mg/m2 | Freq: Once | INTRAVENOUS | Status: AC
  Administered 2013-08-08: 60 mg via INTRAVENOUS
  Filled 2013-08-08: qty 30

## 2013-08-08 MED ORDER — ONDANSETRON 8 MG/50ML IVPB (CHCC)
8.0000 mg | Freq: Once | INTRAVENOUS | Status: AC
Start: 2013-08-08 — End: 2013-08-08
  Administered 2013-08-08: 8 mg via INTRAVENOUS

## 2013-08-08 MED ORDER — ONDANSETRON 8 MG/NS 50 ML IVPB
INTRAVENOUS | Status: AC
  Filled 2013-08-08: qty 8

## 2013-08-08 NOTE — Progress Notes (Signed)
  Walter Dennis   Diagnosis: Multiple myeloma  INTERVAL HISTORY:   Walter Dennis continues treatment with Revlimid and carfilzomib. He reports stable neuropathy symptoms. He had a cardiac stress test yesterday. He has exertional dyspnea. The left testicle pain has resolved. He is completing a course of antibiotics for "sinusitis ". He developed diarrhea after beginning antibiotics.  Objective:  Vital signs in last 24 hours:  Blood pressure 149/62, pulse 71, temperature 98.4 F (36.9 C), temperature source Oral, resp. rate 18, height 6' (1.829 m), weight 223 lb 8 oz (101.379 kg).    HEENT: No thrush or ulcers Resp: Lungs clear bilaterally Cardio: Regular rate and rhythm GI: No hepatosplenomegaly Vascular: No leg edema   Portacath/PICC-without erythema  Lab Results:  Lab Results  Component Value Date   WBC 5.1 08/08/2013   HGB 12.2* 08/08/2013   HCT 36.4* 08/08/2013   MCV 87.3 08/08/2013   PLT 208 08/08/2013   NEUTROABS 4.1 08/08/2013    07/11/2013-serum M spike 0.98, free lambda light chains 13.8  Medications: I have reviewed the patient's current medications.  Assessment/Plan: 1.Multiple myeloma, IgG lambda monoclonal protein  -Initial diagnosis 2008, bone marrow with a 40-50% plasma cells  -Lenalidomide 25 mg,day 1-21 and Decadron 40 mg weekly and 2423, complicated by peripheral neuropathy  -Therapy change to bortezomib IV twice weekly plus Decadron  -High-dose chemotherapy with autologous stem cell transplantation 2009 at [facility]  -Bortezomib, cyclophosphamide, and Decadron December 2012 through February 2014 (discontinued secondary to pulmonary aspergillosis)  -January 2014 M spike rise to 1.1 g/dL from 0.1 g/dL in December 2013. Treatment started with Carlfilzomib, lenalidomide, and Decadron.  -treatment was restarted with Carlfilzomib, lenalidomide, and Decadron 08/16/2012.  -Restaging labs on 10/15/2012 consistent with improvement in the  serum M. Protein.  -Continuation of Carfilzomib/Revlimid/dexamethasone.  -Continued improvement in the serum M spike and IgG level on 11/29/2012.  -Improvement in the serum M spike, slight increase in IgG on 01/03/2013.  -Improvement in the serum M spike, stable IgG, stable lambda free light chains 01/31/2013  -The IgG and serum M spike were slightly higher 02/28/2013, stable lambda light chains  -IgG, M spike, and lambda light chains slightly lower on 04/04/2013.  -Continuation of Carfilzomib/Revlimid/dexamethasone.  -IgG, M spike and lambda light chains stable 05/02/2013.  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 He has been unable to lower the Duragesic patch dose  4. Irritable bowel syndrome  5. Hypertension  6. "Chest pain "/palpitations July 2014-evaluated by cardiology, scheduled for outpatient followup. Normal stress nuclear study 11/07/2012 and 08/07/2013      Disposition:  He appears stable. The plan is to continue the current treatment. He will begin another cycle of Revlimid and Carfilzomib today.  Walter Dennis will return for an office visit in one month.  Ladell Pier, MD  08/08/2013  12:26 PM

## 2013-08-08 NOTE — Telephone Encounter (Signed)
, °

## 2013-08-09 ENCOUNTER — Other Ambulatory Visit: Payer: Self-pay | Admitting: *Deleted

## 2013-08-09 ENCOUNTER — Telehealth: Payer: Self-pay | Admitting: *Deleted

## 2013-08-09 ENCOUNTER — Ambulatory Visit (HOSPITAL_BASED_OUTPATIENT_CLINIC_OR_DEPARTMENT_OTHER): Payer: BC Managed Care – PPO

## 2013-08-09 VITALS — BP 120/60 | HR 73 | Temp 98.0°F | Resp 19

## 2013-08-09 DIAGNOSIS — C9000 Multiple myeloma not having achieved remission: Secondary | ICD-10-CM

## 2013-08-09 DIAGNOSIS — Z5112 Encounter for antineoplastic immunotherapy: Secondary | ICD-10-CM

## 2013-08-09 DIAGNOSIS — C9002 Multiple myeloma in relapse: Secondary | ICD-10-CM

## 2013-08-09 MED ORDER — DEXTROSE 5 % IV SOLN
27.0000 mg/m2 | Freq: Once | INTRAVENOUS | Status: AC
  Administered 2013-08-09: 60 mg via INTRAVENOUS
  Filled 2013-08-09: qty 30

## 2013-08-09 MED ORDER — SODIUM CHLORIDE 0.9 % IJ SOLN
10.0000 mL | INTRAMUSCULAR | Status: DC | PRN
  Administered 2013-08-09: 10 mL
  Filled 2013-08-09: qty 10

## 2013-08-09 MED ORDER — ONDANSETRON 8 MG/50ML IVPB (CHCC)
8.0000 mg | Freq: Once | INTRAVENOUS | Status: AC
  Administered 2013-08-09: 8 mg via INTRAVENOUS

## 2013-08-09 MED ORDER — SODIUM CHLORIDE 0.9 % IV SOLN
Freq: Once | INTRAVENOUS | Status: AC
  Administered 2013-08-09: 16:00:00 via INTRAVENOUS

## 2013-08-09 MED ORDER — HEPARIN SOD (PORK) LOCK FLUSH 100 UNIT/ML IV SOLN
500.0000 [IU] | Freq: Once | INTRAVENOUS | Status: AC | PRN
  Administered 2013-08-09: 500 [IU]
  Filled 2013-08-09: qty 5

## 2013-08-09 MED ORDER — DEXAMETHASONE 4 MG PO TABS: 20.0000 mg | ORAL_TABLET | ORAL | Status: DC

## 2013-08-09 MED ORDER — FUROSEMIDE 40 MG PO TABS: 40.0000 mg | ORAL_TABLET | Freq: Every day | ORAL | Status: DC

## 2013-08-09 MED ORDER — ONDANSETRON 8 MG/NS 50 ML IVPB
INTRAVENOUS | Status: AC
  Filled 2013-08-09: qty 8

## 2013-08-09 NOTE — Patient Instructions (Signed)
[facility] Discharge Instructions for Patients Receiving Chemotherapy  Today you received the following chemotherapy agents: Kyprolis   To help prevent nausea and vomiting after your treatment, we encourage you to take your nausea medication: Zofran 8 mg every 12 hrs as needed.    If you develop nausea and vomiting that is not controlled by your nausea medication, call the clinic.   BELOW ARE SYMPTOMS THAT SHOULD BE REPORTED IMMEDIATELY:  *FEVER GREATER THAN 100.5 F  *CHILLS WITH OR WITHOUT FEVER  NAUSEA AND VOMITING THAT IS NOT CONTROLLED WITH YOUR NAUSEA MEDICATION  *UNUSUAL SHORTNESS OF BREATH  *UNUSUAL BRUISING OR BLEEDING  TENDERNESS IN MOUTH AND THROAT WITH OR WITHOUT PRESENCE OF ULCERS  *URINARY PROBLEMS  *BOWEL PROBLEMS  UNUSUAL RASH Items with * indicate a potential emergency and should be followed up as soon as possible.  Feel free to call the clinic you have any questions or concerns. The clinic phone number is [phone].

## 2013-08-09 NOTE — Telephone Encounter (Signed)
Per staff message and POF I have scheduled appts.  JMW  

## 2013-08-10 ENCOUNTER — Other Ambulatory Visit: Payer: Self-pay | Admitting: Oncology

## 2013-08-12 LAB — PROTEIN ELECTROPHORESIS, SERUM
ALPHA-2-GLOBULIN: 9 % (ref 7.1–11.8)
Albumin ELP: 56.4 % (ref 55.8–66.1)
Alpha-1-Globulin: 4.5 % (ref 2.9–4.9)
BETA 2: 20.2 % — AB (ref 3.2–6.5)
BETA GLOBULIN: 6.4 % (ref 4.7–7.2)
GAMMA GLOBULIN: 3.5 % — AB (ref 11.1–18.8)
M-SPIKE, %: 0.98 g/dL
TOTAL PROTEIN, SERUM ELECTROPHOR: 7.3 g/dL (ref 6.0–8.3)

## 2013-08-12 LAB — KAPPA/LAMBDA LIGHT CHAINS
KAPPA FREE LGHT CHN: 0.17 mg/dL — AB (ref 0.33–1.94)
Kappa:Lambda Ratio: 0.01 — ABNORMAL LOW (ref 0.26–1.65)
Lambda Free Lght Chn: 11.6 mg/dL — ABNORMAL HIGH (ref 0.57–2.63)

## 2013-08-12 LAB — IGG: IgG (Immunoglobin G), Serum: 1510 mg/dL (ref 650–1600)

## 2013-08-13 ENCOUNTER — Telehealth: Payer: Self-pay | Admitting: Nurse Practitioner

## 2013-08-13 ENCOUNTER — Other Ambulatory Visit: Payer: Self-pay | Admitting: Oncology

## 2013-08-13 NOTE — Telephone Encounter (Signed)
, °

## 2013-08-14 ENCOUNTER — Other Ambulatory Visit: Payer: Self-pay | Admitting: Oncology

## 2013-08-14 DIAGNOSIS — C9000 Multiple myeloma not having achieved remission: Secondary | ICD-10-CM

## 2013-08-15 ENCOUNTER — Ambulatory Visit (HOSPITAL_BASED_OUTPATIENT_CLINIC_OR_DEPARTMENT_OTHER): Payer: BC Managed Care – PPO

## 2013-08-15 ENCOUNTER — Other Ambulatory Visit (HOSPITAL_BASED_OUTPATIENT_CLINIC_OR_DEPARTMENT_OTHER): Payer: BC Managed Care – PPO

## 2013-08-15 VITALS — BP 157/99 | HR 85 | Temp 97.4°F | Resp 18

## 2013-08-15 DIAGNOSIS — C9000 Multiple myeloma not having achieved remission: Secondary | ICD-10-CM

## 2013-08-15 DIAGNOSIS — Z5112 Encounter for antineoplastic immunotherapy: Secondary | ICD-10-CM

## 2013-08-15 DIAGNOSIS — C9002 Multiple myeloma in relapse: Secondary | ICD-10-CM

## 2013-08-15 LAB — CBC WITH DIFFERENTIAL/PLATELET
BASO%: 0.4 % (ref 0.0–2.0)
BASOS ABS: 0 10*3/uL (ref 0.0–0.1)
EOS ABS: 0 10*3/uL (ref 0.0–0.5)
EOS%: 0.1 % (ref 0.0–7.0)
HEMATOCRIT: 34.7 % — AB (ref 38.4–49.9)
HEMOGLOBIN: 11.5 g/dL — AB (ref 13.0–17.1)
LYMPH#: 0.6 10*3/uL — AB (ref 0.9–3.3)
LYMPH%: 7.7 % — ABNORMAL LOW (ref 14.0–49.0)
MCH: 29.6 pg (ref 27.2–33.4)
MCHC: 33.1 g/dL (ref 32.0–36.0)
MCV: 89.5 fL (ref 79.3–98.0)
MONO#: 0.1 10*3/uL (ref 0.1–0.9)
MONO%: 1 % (ref 0.0–14.0)
NEUT%: 90.8 % — AB (ref 39.0–75.0)
NEUTROS ABS: 7.3 10*3/uL — AB (ref 1.5–6.5)
Platelets: 271 10*3/uL (ref 140–400)
RBC: 3.88 10*6/uL — ABNORMAL LOW (ref 4.20–5.82)
RDW: 15.5 % — ABNORMAL HIGH (ref 11.0–14.6)
WBC: 8 10*3/uL (ref 4.0–10.3)

## 2013-08-15 MED ORDER — ONDANSETRON 8 MG/NS 50 ML IVPB
INTRAVENOUS | Status: AC
  Filled 2013-08-15: qty 8

## 2013-08-15 MED ORDER — SODIUM CHLORIDE 0.9 % IV SOLN
Freq: Once | INTRAVENOUS | Status: AC
  Administered 2013-08-15: 16:00:00 via INTRAVENOUS

## 2013-08-15 MED ORDER — ONDANSETRON 8 MG/50ML IVPB (CHCC)
8.0000 mg | Freq: Once | INTRAVENOUS | Status: AC
  Administered 2013-08-15: 8 mg via INTRAVENOUS

## 2013-08-15 MED ORDER — HEPARIN SOD (PORK) LOCK FLUSH 100 UNIT/ML IV SOLN
500.0000 [IU] | Freq: Once | INTRAVENOUS | Status: AC | PRN
  Administered 2013-08-15: 500 [IU]
  Filled 2013-08-15: qty 5

## 2013-08-15 MED ORDER — SODIUM CHLORIDE 0.9 % IJ SOLN
10.0000 mL | INTRAMUSCULAR | Status: DC | PRN
  Administered 2013-08-15: 10 mL
  Filled 2013-08-15: qty 10

## 2013-08-15 MED ORDER — DEXTROSE 5 % IV SOLN
27.0000 mg/m2 | Freq: Once | INTRAVENOUS | Status: AC
  Administered 2013-08-15: 60 mg via INTRAVENOUS
  Filled 2013-08-15: qty 30

## 2013-08-15 NOTE — Patient Instructions (Signed)
[facility] Discharge Instructions for Patients Receiving Chemotherapy  Today you received the following chemotherapy agents Kyprolis.  To help prevent nausea and vomiting after your treatment, we encourage you to take your nausea medication.   If you develop nausea and vomiting that is not controlled by your nausea medication, call the clinic.   BELOW ARE SYMPTOMS THAT SHOULD BE REPORTED IMMEDIATELY:  *FEVER GREATER THAN 100.5 F  *CHILLS WITH OR WITHOUT FEVER  NAUSEA AND VOMITING THAT IS NOT CONTROLLED WITH YOUR NAUSEA MEDICATION  *UNUSUAL SHORTNESS OF BREATH  *UNUSUAL BRUISING OR BLEEDING  TENDERNESS IN MOUTH AND THROAT WITH OR WITHOUT PRESENCE OF ULCERS  *URINARY PROBLEMS  *BOWEL PROBLEMS  UNUSUAL RASH Items with * indicate a potential emergency and should be followed up as soon as possible.  Feel free to call the clinic you have any questions or concerns. The clinic phone number is [phone].    

## 2013-08-16 ENCOUNTER — Ambulatory Visit (HOSPITAL_BASED_OUTPATIENT_CLINIC_OR_DEPARTMENT_OTHER): Payer: BC Managed Care – PPO

## 2013-08-16 VITALS — BP 146/73 | HR 62 | Temp 98.4°F | Resp 20

## 2013-08-16 DIAGNOSIS — Z5112 Encounter for antineoplastic immunotherapy: Secondary | ICD-10-CM

## 2013-08-16 DIAGNOSIS — C9000 Multiple myeloma not having achieved remission: Secondary | ICD-10-CM

## 2013-08-16 DIAGNOSIS — C9002 Multiple myeloma in relapse: Secondary | ICD-10-CM

## 2013-08-16 MED ORDER — SODIUM CHLORIDE 0.9 % IV SOLN
Freq: Once | INTRAVENOUS | Status: AC
  Administered 2013-08-16: 16:00:00 via INTRAVENOUS

## 2013-08-16 MED ORDER — HEPARIN SOD (PORK) LOCK FLUSH 100 UNIT/ML IV SOLN
500.0000 [IU] | Freq: Once | INTRAVENOUS | Status: AC | PRN
  Administered 2013-08-16: 500 [IU]
  Filled 2013-08-16: qty 5

## 2013-08-16 MED ORDER — SODIUM CHLORIDE 0.9 % IJ SOLN
10.0000 mL | INTRAMUSCULAR | Status: DC | PRN
  Administered 2013-08-16: 10 mL
  Filled 2013-08-16: qty 10

## 2013-08-16 MED ORDER — ONDANSETRON 8 MG/NS 50 ML IVPB
INTRAVENOUS | Status: AC
  Filled 2013-08-16: qty 8

## 2013-08-16 MED ORDER — ONDANSETRON 8 MG/50ML IVPB (CHCC)
8.0000 mg | Freq: Once | INTRAVENOUS | Status: AC
  Administered 2013-08-16: 8 mg via INTRAVENOUS

## 2013-08-16 MED ORDER — DEXTROSE 5 % IV SOLN
27.0000 mg/m2 | Freq: Once | INTRAVENOUS | Status: AC
  Administered 2013-08-16: 60 mg via INTRAVENOUS
  Filled 2013-08-16: qty 30

## 2013-08-16 NOTE — Patient Instructions (Signed)
[facility] Discharge Instructions for Patients Receiving Chemotherapy  Today you received the following chemotherapy agents: Kyprolis  To help prevent nausea and vomiting after your treatment, we encourage you to take your nausea medication: as directed.   If you develop nausea and vomiting that is not controlled by your nausea medication, call the clinic.   BELOW ARE SYMPTOMS THAT SHOULD BE REPORTED IMMEDIATELY:  *FEVER GREATER THAN 100.5 F  *CHILLS WITH OR WITHOUT FEVER  NAUSEA AND VOMITING THAT IS NOT CONTROLLED WITH YOUR NAUSEA MEDICATION  *UNUSUAL SHORTNESS OF BREATH  *UNUSUAL BRUISING OR BLEEDING  TENDERNESS IN MOUTH AND THROAT WITH OR WITHOUT PRESENCE OF ULCERS  *URINARY PROBLEMS  *BOWEL PROBLEMS  UNUSUAL RASH Items with * indicate a potential emergency and should be followed up as soon as possible.  Feel free to call the clinic you have any questions or concerns. The clinic phone number is [phone].    

## 2013-08-22 ENCOUNTER — Other Ambulatory Visit: Payer: Self-pay | Admitting: *Deleted

## 2013-08-22 ENCOUNTER — Ambulatory Visit: Payer: BC Managed Care – PPO

## 2013-08-22 ENCOUNTER — Ambulatory Visit (HOSPITAL_BASED_OUTPATIENT_CLINIC_OR_DEPARTMENT_OTHER): Payer: BC Managed Care – PPO

## 2013-08-22 ENCOUNTER — Other Ambulatory Visit (HOSPITAL_BASED_OUTPATIENT_CLINIC_OR_DEPARTMENT_OTHER): Payer: BC Managed Care – PPO

## 2013-08-22 VITALS — BP 151/80 | HR 99 | Temp 98.1°F

## 2013-08-22 DIAGNOSIS — Z5112 Encounter for antineoplastic immunotherapy: Secondary | ICD-10-CM

## 2013-08-22 DIAGNOSIS — C9002 Multiple myeloma in relapse: Secondary | ICD-10-CM

## 2013-08-22 DIAGNOSIS — C9000 Multiple myeloma not having achieved remission: Secondary | ICD-10-CM

## 2013-08-22 DIAGNOSIS — Z95828 Presence of other vascular implants and grafts: Secondary | ICD-10-CM

## 2013-08-22 LAB — CBC WITH DIFFERENTIAL/PLATELET
BASO%: 0.5 % (ref 0.0–2.0)
BASOS ABS: 0 10*3/uL (ref 0.0–0.1)
EOS ABS: 0 10*3/uL (ref 0.0–0.5)
EOS%: 0.1 % (ref 0.0–7.0)
HCT: 36.9 % — ABNORMAL LOW (ref 38.4–49.9)
HEMOGLOBIN: 12.2 g/dL — AB (ref 13.0–17.1)
LYMPH#: 0.6 10*3/uL — AB (ref 0.9–3.3)
LYMPH%: 9.5 % — ABNORMAL LOW (ref 14.0–49.0)
MCH: 29.3 pg (ref 27.2–33.4)
MCHC: 33.1 g/dL (ref 32.0–36.0)
MCV: 88.5 fL (ref 79.3–98.0)
MONO#: 0.1 10*3/uL (ref 0.1–0.9)
MONO%: 1 % (ref 0.0–14.0)
NEUT%: 88.9 % — AB (ref 39.0–75.0)
NEUTROS ABS: 6 10*3/uL (ref 1.5–6.5)
Platelets: 283 10*3/uL (ref 140–400)
RBC: 4.17 10*6/uL — ABNORMAL LOW (ref 4.20–5.82)
RDW: 15.9 % — ABNORMAL HIGH (ref 11.0–14.6)
WBC: 6.8 10*3/uL (ref 4.0–10.3)

## 2013-08-22 MED ORDER — HEPARIN SOD (PORK) LOCK FLUSH 100 UNIT/ML IV SOLN
500.0000 [IU] | Freq: Once | INTRAVENOUS | Status: AC | PRN
  Administered 2013-08-22: 500 [IU]
  Filled 2013-08-22: qty 5

## 2013-08-22 MED ORDER — ONDANSETRON 8 MG/50ML IVPB (CHCC)
8.0000 mg | Freq: Once | INTRAVENOUS | Status: AC
  Administered 2013-08-22: 8 mg via INTRAVENOUS

## 2013-08-22 MED ORDER — SODIUM CHLORIDE 0.9 % IJ SOLN
10.0000 mL | INTRAMUSCULAR | Status: DC | PRN
  Administered 2013-08-22: 10 mL via INTRAVENOUS
  Filled 2013-08-22: qty 10

## 2013-08-22 MED ORDER — FENTANYL 25 MCG/HR TD PT72: 25.0000 ug | MEDICATED_PATCH | TRANSDERMAL | Status: DC

## 2013-08-22 MED ORDER — SODIUM CHLORIDE 0.9 % IJ SOLN
10.0000 mL | INTRAMUSCULAR | Status: DC | PRN
  Administered 2013-08-22: 10 mL
  Filled 2013-08-22: qty 10

## 2013-08-22 MED ORDER — SODIUM CHLORIDE 0.9 % IV SOLN
Freq: Once | INTRAVENOUS | Status: AC
  Administered 2013-08-22: 16:00:00 via INTRAVENOUS

## 2013-08-22 MED ORDER — SODIUM CHLORIDE 0.9 % IJ SOLN
10.0000 mL | INTRAMUSCULAR | Status: DC | PRN
  Filled 2013-08-22: qty 10

## 2013-08-22 MED ORDER — ONDANSETRON 8 MG/NS 50 ML IVPB
INTRAVENOUS | Status: AC
  Filled 2013-08-22: qty 8

## 2013-08-22 MED ORDER — DEXTROSE 5 % IV SOLN
27.0000 mg/m2 | Freq: Once | INTRAVENOUS | Status: AC
  Administered 2013-08-22: 60 mg via INTRAVENOUS
  Filled 2013-08-22: qty 30

## 2013-08-22 NOTE — Patient Instructions (Signed)

## 2013-08-22 NOTE — Patient Instructions (Signed)
[facility] Discharge Instructions for Patients Receiving Chemotherapy  Today you received the following chemotherapy agents kyprolis  To help prevent nausea and vomiting after your treatment, we encourage you to take your nausea medication as directed   If you develop nausea and vomiting that is not controlled by your nausea medication, call the clinic.   BELOW ARE SYMPTOMS THAT SHOULD BE REPORTED IMMEDIATELY:  *FEVER GREATER THAN 100.5 F  *CHILLS WITH OR WITHOUT FEVER  NAUSEA AND VOMITING THAT IS NOT CONTROLLED WITH YOUR NAUSEA MEDICATION  *UNUSUAL SHORTNESS OF BREATH  *UNUSUAL BRUISING OR BLEEDING  TENDERNESS IN MOUTH AND THROAT WITH OR WITHOUT PRESENCE OF ULCERS  *URINARY PROBLEMS  *BOWEL PROBLEMS  UNUSUAL RASH Items with * indicate a potential emergency and should be followed up as soon as possible.  Feel free to call the clinic you have any questions or concerns. The clinic phone number is [phone].  

## 2013-08-23 ENCOUNTER — Ambulatory Visit (HOSPITAL_BASED_OUTPATIENT_CLINIC_OR_DEPARTMENT_OTHER): Payer: BC Managed Care – PPO

## 2013-08-23 VITALS — BP 144/74 | HR 89 | Temp 97.9°F | Resp 18

## 2013-08-23 DIAGNOSIS — C9002 Multiple myeloma in relapse: Secondary | ICD-10-CM

## 2013-08-23 DIAGNOSIS — Z5112 Encounter for antineoplastic immunotherapy: Secondary | ICD-10-CM

## 2013-08-23 DIAGNOSIS — C9000 Multiple myeloma not having achieved remission: Secondary | ICD-10-CM

## 2013-08-23 MED ORDER — ONDANSETRON 8 MG/NS 50 ML IVPB
INTRAVENOUS | Status: AC
  Filled 2013-08-23: qty 8

## 2013-08-23 MED ORDER — SODIUM CHLORIDE 0.9 % IV SOLN
Freq: Once | INTRAVENOUS | Status: AC
  Administered 2013-08-23: 16:00:00 via INTRAVENOUS

## 2013-08-23 MED ORDER — DEXTROSE 5 % IV SOLN
27.0000 mg/m2 | Freq: Once | INTRAVENOUS | Status: AC
  Administered 2013-08-23: 60 mg via INTRAVENOUS
  Filled 2013-08-23: qty 30

## 2013-08-23 MED ORDER — ONDANSETRON 8 MG/50ML IVPB (CHCC)
8.0000 mg | Freq: Once | INTRAVENOUS | Status: AC
  Administered 2013-08-23: 8 mg via INTRAVENOUS

## 2013-08-23 MED ORDER — HEPARIN SOD (PORK) LOCK FLUSH 100 UNIT/ML IV SOLN
500.0000 [IU] | Freq: Once | INTRAVENOUS | Status: AC | PRN
  Administered 2013-08-23: 500 [IU]
  Filled 2013-08-23: qty 5

## 2013-08-23 MED ORDER — SODIUM CHLORIDE 0.9 % IJ SOLN
10.0000 mL | INTRAMUSCULAR | Status: DC | PRN
  Administered 2013-08-23: 10 mL
  Filled 2013-08-23: qty 10

## 2013-08-23 NOTE — Patient Instructions (Signed)
[facility] Discharge Instructions for Patients Receiving Chemotherapy  Today you received the following chemotherapy agents Kyprolis To help prevent nausea and vomiting after your treatment, we encourage you to take your nausea medication as prescribed.  If you develop nausea and vomiting that is not controlled by your nausea medication, call the clinic.   BELOW ARE SYMPTOMS THAT SHOULD BE REPORTED IMMEDIATELY:  *FEVER GREATER THAN 100.5 F  *CHILLS WITH OR WITHOUT FEVER  NAUSEA AND VOMITING THAT IS NOT CONTROLLED WITH YOUR NAUSEA MEDICATION  *UNUSUAL SHORTNESS OF BREATH  *UNUSUAL BRUISING OR BLEEDING  TENDERNESS IN MOUTH AND THROAT WITH OR WITHOUT PRESENCE OF ULCERS  *URINARY PROBLEMS  *BOWEL PROBLEMS  UNUSUAL RASH Items with * indicate a potential emergency and should be followed up as soon as possible.  Feel free to call the clinic you have any questions or concerns. The clinic phone number is [phone].    

## 2013-08-26 ENCOUNTER — Telehealth: Payer: Self-pay

## 2013-08-26 NOTE — Telephone Encounter (Signed)
revlimid refill request placed on Dr Gearldine Shown nurse

## 2013-08-27 ENCOUNTER — Other Ambulatory Visit: Payer: Self-pay | Admitting: *Deleted

## 2013-08-27 DIAGNOSIS — C9000 Multiple myeloma not having achieved remission: Secondary | ICD-10-CM

## 2013-08-27 MED ORDER — LENALIDOMIDE 10 MG PO CAPS: 10.0000 mg | ORAL_CAPSULE | Freq: Every day | ORAL | Status: DC

## 2013-09-05 ENCOUNTER — Other Ambulatory Visit (HOSPITAL_BASED_OUTPATIENT_CLINIC_OR_DEPARTMENT_OTHER): Payer: BC Managed Care – PPO

## 2013-09-05 ENCOUNTER — Ambulatory Visit (HOSPITAL_BASED_OUTPATIENT_CLINIC_OR_DEPARTMENT_OTHER): Payer: BC Managed Care – PPO | Admitting: Nurse Practitioner

## 2013-09-05 ENCOUNTER — Telehealth: Payer: Self-pay | Admitting: Oncology

## 2013-09-05 ENCOUNTER — Other Ambulatory Visit: Payer: Self-pay | Admitting: Oncology

## 2013-09-05 ENCOUNTER — Other Ambulatory Visit: Payer: Self-pay | Admitting: *Deleted

## 2013-09-05 ENCOUNTER — Ambulatory Visit: Payer: BC Managed Care – PPO

## 2013-09-05 ENCOUNTER — Ambulatory Visit (HOSPITAL_BASED_OUTPATIENT_CLINIC_OR_DEPARTMENT_OTHER): Payer: BC Managed Care – PPO

## 2013-09-05 VITALS — BP 156/85 | HR 117 | Temp 98.2°F | Resp 19 | Ht 72.0 in | Wt 221.7 lb

## 2013-09-05 DIAGNOSIS — C9002 Multiple myeloma in relapse: Secondary | ICD-10-CM

## 2013-09-05 DIAGNOSIS — Z5112 Encounter for antineoplastic immunotherapy: Secondary | ICD-10-CM

## 2013-09-05 DIAGNOSIS — C9000 Multiple myeloma not having achieved remission: Secondary | ICD-10-CM

## 2013-09-05 DIAGNOSIS — I10 Essential (primary) hypertension: Secondary | ICD-10-CM

## 2013-09-05 DIAGNOSIS — Z95828 Presence of other vascular implants and grafts: Secondary | ICD-10-CM

## 2013-09-05 DIAGNOSIS — R197 Diarrhea, unspecified: Secondary | ICD-10-CM

## 2013-09-05 DIAGNOSIS — K589 Irritable bowel syndrome without diarrhea: Secondary | ICD-10-CM

## 2013-09-05 LAB — CBC WITH DIFFERENTIAL/PLATELET
BASO%: 0.8 % (ref 0.0–2.0)
Basophils Absolute: 0.1 10*3/uL (ref 0.0–0.1)
EOS ABS: 0 10*3/uL (ref 0.0–0.5)
EOS%: 0 % (ref 0.0–7.0)
HCT: 38.9 % (ref 38.4–49.9)
HEMOGLOBIN: 12.9 g/dL — AB (ref 13.0–17.1)
LYMPH%: 10.4 % — AB (ref 14.0–49.0)
MCH: 29.3 pg (ref 27.2–33.4)
MCHC: 33.1 g/dL (ref 32.0–36.0)
MCV: 88.6 fL (ref 79.3–98.0)
MONO#: 0 10*3/uL — ABNORMAL LOW (ref 0.1–0.9)
MONO%: 0.3 % (ref 0.0–14.0)
NEUT%: 88.5 % — ABNORMAL HIGH (ref 39.0–75.0)
NEUTROS ABS: 6.8 10*3/uL — AB (ref 1.5–6.5)
PLATELETS: 316 10*3/uL (ref 140–400)
RBC: 4.4 10*6/uL (ref 4.20–5.82)
RDW: 15.8 % — ABNORMAL HIGH (ref 11.0–14.6)
WBC: 7.7 10*3/uL (ref 4.0–10.3)
lymph#: 0.8 10*3/uL — ABNORMAL LOW (ref 0.9–3.3)

## 2013-09-05 LAB — COMPREHENSIVE METABOLIC PANEL (CC13)
ALT: 40 U/L (ref 0–55)
ANION GAP: 15 meq/L — AB (ref 3–11)
AST: 18 U/L (ref 5–34)
Albumin: 4.1 g/dL (ref 3.5–5.0)
Alkaline Phosphatase: 67 U/L (ref 40–150)
BUN: 18.6 mg/dL (ref 7.0–26.0)
CALCIUM: 9.8 mg/dL (ref 8.4–10.4)
CO2: 20 meq/L — AB (ref 22–29)
Chloride: 105 mEq/L (ref 98–109)
Creatinine: 1.4 mg/dL — ABNORMAL HIGH (ref 0.7–1.3)
Glucose: 256 mg/dl — ABNORMAL HIGH (ref 70–140)
Potassium: 4.2 mEq/L (ref 3.5–5.1)
SODIUM: 140 meq/L (ref 136–145)
TOTAL PROTEIN: 8.2 g/dL (ref 6.4–8.3)
Total Bilirubin: 0.84 mg/dL (ref 0.20–1.20)

## 2013-09-05 MED ORDER — SODIUM CHLORIDE 0.9 % IJ SOLN
10.0000 mL | INTRAMUSCULAR | Status: DC | PRN
  Administered 2013-09-05 (×2): 10 mL via INTRAVENOUS
  Filled 2013-09-05: qty 10

## 2013-09-05 MED ORDER — ONDANSETRON 8 MG/50ML IVPB (CHCC)
8.0000 mg | Freq: Once | INTRAVENOUS | Status: AC
  Administered 2013-09-05: 8 mg via INTRAVENOUS

## 2013-09-05 MED ORDER — SODIUM CHLORIDE 0.9 % IV SOLN
Freq: Once | INTRAVENOUS | Status: AC
  Administered 2013-09-05: 16:00:00 via INTRAVENOUS

## 2013-09-05 MED ORDER — HEPARIN SOD (PORK) LOCK FLUSH 100 UNIT/ML IV SOLN
500.0000 [IU] | Freq: Once | INTRAVENOUS | Status: AC | PRN
  Administered 2013-09-05: 500 [IU]
  Filled 2013-09-05: qty 5

## 2013-09-05 MED ORDER — SODIUM CHLORIDE 0.9 % IJ SOLN
10.0000 mL | INTRAMUSCULAR | Status: DC | PRN
  Filled 2013-09-05: qty 10

## 2013-09-05 MED ORDER — DEXTROSE 5 % IV SOLN
27.0000 mg/m2 | Freq: Once | INTRAVENOUS | Status: AC
  Administered 2013-09-05: 60 mg via INTRAVENOUS
  Filled 2013-09-05: qty 30

## 2013-09-05 NOTE — Telephone Encounter (Signed)
Sent michelle a staff message to add the chemo appts

## 2013-09-05 NOTE — Patient Instructions (Signed)
[facility]charge [facility] for [facility] Receiving Chemotherapy  Today you received the following chemotherapy agents: Kyprolis  To help prevent nausea and vomiting after your treatment, we encourage you to take your nausea medication: as directed.   If you develop nausea and vomiting that is not controlled by your nausea medication, call the clinic.   BELOW ARE SYMPTOMS THAT SHOULD BE REPORTED IMMEDIATELY:  *FEVER GREATER THAN 100.5 F  *CHILLS WITH OR WITHOUT FEVER  NAUSEA AND VOMITING THAT IS NOT CONTROLLED WITH YOUR NAUSEA MEDICATION  *UNUSUAL SHORTNESS OF BREATH  *UNUSUAL BRUISING OR BLEEDING  TENDERNESS IN MOUTH AND THROAT WITH OR WITHOUT PRESENCE OF ULCERS  *URINARY PROBLEMS  *BOWEL PROBLEMS  UNUSUAL RASH Items with * indicate a potential emergency and should be followed up as soon as possible.  Feel free to call the clinic you have any questions or concerns. The clinic phone number is [phone].    

## 2013-09-05 NOTE — Progress Notes (Signed)
[facility] OFFICE PROGRESS NOTE   Diagnosis:  Multiple myeloma.  INTERVAL HISTORY:   Walter Dennis returns as scheduled. He continues Carfilzomib/Revlimid/Decadron. He has stable neuropathy symptoms. No nausea/vomiting. No mouth sores. He continues to have loose stools estimating 3-4 occurrences a day. The loose stools began while he was taking antibiotics several weeks ago. He continues to have dyspnea and a burning sensation at the lower chest with activity. He reports he has been referred to pulmonary. Objective:  Vital signs in last 24 hours:  Blood pressure 156/85, pulse 117, temperature 98.2 F (36.8 C), temperature source Oral, resp. rate 19, height 6' (1.829 m), weight 221 lb 11.2 oz (100.562 kg), SpO2 100.00%.    HEENT: No thrush or ulcerations. Resp: Lungs clear. Cardio: Regular cardiac rhythm. GI: Abdomen soft and nontender. No organomegaly. Vascular: No leg edema. Neuro: Vibratory sense intact over the fingertips per tuning fork exam.     Lab Results:  Lab Results  Component Value Date   WBC 7.7 09/05/2013   HGB 12.9* 09/05/2013   HCT 38.9 09/05/2013   MCV 88.6 09/05/2013   PLT 316 09/05/2013   NEUTROABS 6.8* 09/05/2013   08/08/2013 serum M spike 0.98; free lambda light chains 11.6; IgG 1510. Imaging:  No results found.  Medications: I have reviewed the patient's current medications.  Assessment/Plan: 1.Multiple myeloma, IgG lambda monoclonal protein  -Initial diagnosis 2008, bone marrow with a 40-50% plasma cells  -Lenalidomide 25 mg,day 1-21 and Decadron 40 mg weekly and 3235, complicated by peripheral neuropathy  -Therapy change to bortezomib IV twice weekly plus Decadron  -High-dose chemotherapy with autologous stem cell transplantation 2009 at [facility]  -Bortezomib, cyclophosphamide, and Decadron December 2012 through February 2014 (discontinued secondary to pulmonary aspergillosis)  -January 2014 M spike rise to 1.1 g/dL from 0.1 g/dL in December  2013. Treatment started with Carlfilzomib, lenalidomide, and Decadron.  -treatment was restarted with Carlfilzomib, lenalidomide, and Decadron 08/16/2012.  -Restaging labs on 10/15/2012 consistent with improvement in the serum M. Protein.  -Continuation of Carfilzomib/Revlimid/dexamethasone.  -Continued improvement in the serum M spike and IgG level on 11/29/2012.  -Improvement in the serum M spike, slight increase in IgG on 01/03/2013.  -Improvement in the serum M spike, stable IgG, stable lambda free light chains 01/31/2013  -The IgG and serum M spike were slightly higher 02/28/2013, stable lambda light chains  -IgG, M spike, and lambda light chains slightly lower on 04/04/2013.  -Continuation of Carfilzomib/Revlimid/dexamethasone.  -IgG, M spike and lambda light chains stable 05/02/2013.  -IgG, serum M spike, and lambda free light chains stable on 07/11/2013; 08/08/2013. 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 He has been unable to lower the Duragesic patch dose  4. Irritable bowel syndrome  5. Hypertension  6. "Chest pain "/palpitations July 2014-evaluated by cardiology, scheduled for outpatient followup. Normal stress nuclear study 11/07/2012 and 08/07/2013. 7. Diarrhea. He has had persistent diarrhea since completing a course of antibiotics a few weeks ago. He will return a specimen to test for C. difficile.    Disposition: Walter Dennis appears stable. Plan to proceed with the next cycle of Revlimid/Carfilzomib/dexamethasone today as scheduled.  He has an upcoming vacation and will be out of town 09/28/2013 through 10/12/2013. We will see him in followup prior to beginning the next cycle on 10/17/2013. He will contact the office in the interim with any problems.    Ned Card ANP/GNP-BC   09/05/2013  4:14 PM

## 2013-09-05 NOTE — Patient Instructions (Signed)

## 2013-09-05 NOTE — Telephone Encounter (Signed)
S/w the pt and he is aware to pick up his July and aug appts the next time he comes in for an appt.

## 2013-09-06 ENCOUNTER — Ambulatory Visit (HOSPITAL_BASED_OUTPATIENT_CLINIC_OR_DEPARTMENT_OTHER): Payer: BC Managed Care – PPO

## 2013-09-06 ENCOUNTER — Telehealth: Payer: Self-pay | Admitting: *Deleted

## 2013-09-06 ENCOUNTER — Ambulatory Visit: Payer: BC Managed Care – PPO

## 2013-09-06 VITALS — BP 132/63 | HR 85 | Temp 98.3°F | Resp 18

## 2013-09-06 DIAGNOSIS — Z5112 Encounter for antineoplastic immunotherapy: Secondary | ICD-10-CM

## 2013-09-06 DIAGNOSIS — C9000 Multiple myeloma not having achieved remission: Secondary | ICD-10-CM

## 2013-09-06 DIAGNOSIS — Z95828 Presence of other vascular implants and grafts: Secondary | ICD-10-CM

## 2013-09-06 DIAGNOSIS — C9002 Multiple myeloma in relapse: Secondary | ICD-10-CM

## 2013-09-06 MED ORDER — SODIUM CHLORIDE 0.9 % IJ SOLN
10.0000 mL | INTRAMUSCULAR | Status: DC | PRN
  Administered 2013-09-06: 10 mL
  Filled 2013-09-06: qty 10

## 2013-09-06 MED ORDER — ONDANSETRON 8 MG/NS 50 ML IVPB
INTRAVENOUS | Status: AC
  Filled 2013-09-06: qty 8

## 2013-09-06 MED ORDER — HEPARIN SOD (PORK) LOCK FLUSH 100 UNIT/ML IV SOLN
500.0000 [IU] | Freq: Once | INTRAVENOUS | Status: AC | PRN
  Administered 2013-09-06: 500 [IU]
  Filled 2013-09-06: qty 5

## 2013-09-06 MED ORDER — SODIUM CHLORIDE 0.9 % IV SOLN
Freq: Once | INTRAVENOUS | Status: AC
  Administered 2013-09-06: 12:00:00 via INTRAVENOUS

## 2013-09-06 MED ORDER — CARFILZOMIB CHEMO INJECTION 60 MG
27.0000 mg/m2 | Freq: Once | INTRAVENOUS | Status: AC
  Administered 2013-09-06: 60 mg via INTRAVENOUS
  Filled 2013-09-06: qty 30

## 2013-09-06 MED ORDER — SODIUM CHLORIDE 0.9 % IJ SOLN
10.0000 mL | INTRAMUSCULAR | Status: DC | PRN
  Administered 2013-09-06: 10 mL via INTRAVENOUS
  Filled 2013-09-06: qty 10

## 2013-09-06 MED ORDER — ONDANSETRON 8 MG/50ML IVPB (CHCC)
8.0000 mg | Freq: Once | INTRAVENOUS | Status: AC
  Administered 2013-09-06: 8 mg via INTRAVENOUS

## 2013-09-06 NOTE — Telephone Encounter (Signed)
Per staff message and POF I have scheduled appts. Advised scheduler of appts. JMW  

## 2013-09-06 NOTE — Patient Instructions (Signed)
[facility] Discharge Instructions for Patients Receiving Chemotherapy  Today you received the following chemotherapy agents Kyprolis To help prevent nausea and vomiting after your treatment, we encourage you to take your nausea medication as prescribed.  If you develop nausea and vomiting that is not controlled by your nausea medication, call the clinic.   BELOW ARE SYMPTOMS THAT SHOULD BE REPORTED IMMEDIATELY:  *FEVER GREATER THAN 100.5 F  *CHILLS WITH OR WITHOUT FEVER  NAUSEA AND VOMITING THAT IS NOT CONTROLLED WITH YOUR NAUSEA MEDICATION  *UNUSUAL SHORTNESS OF BREATH  *UNUSUAL BRUISING OR BLEEDING  TENDERNESS IN MOUTH AND THROAT WITH OR WITHOUT PRESENCE OF ULCERS  *URINARY PROBLEMS  *BOWEL PROBLEMS  UNUSUAL RASH Items with * indicate a potential emergency and should be followed up as soon as possible.  Feel free to call the clinic you have any questions or concerns. The clinic phone number is [phone].    

## 2013-09-06 NOTE — Patient Instructions (Signed)

## 2013-09-10 LAB — PROTEIN ELECTROPHORESIS, SERUM
Albumin ELP: 51.3 % — ABNORMAL LOW (ref 55.8–66.1)
Alpha-1-Globulin: 4.4 % (ref 2.9–4.9)
Alpha-2-Globulin: 9 % (ref 7.1–11.8)
Beta 2: 25.4 % — ABNORMAL HIGH (ref 3.2–6.5)
Beta Globulin: 6.7 % (ref 4.7–7.2)
Gamma Globulin: 3.2 % — ABNORMAL LOW (ref 11.1–18.8)
M-Spike, %: 1.34 g/dL
Total Protein, Serum Electrophoresis: 7.9 g/dL (ref 6.0–8.3)

## 2013-09-10 LAB — KAPPA/LAMBDA LIGHT CHAINS
Kappa free light chain: <0.03 mg/dL — ABNORMAL LOW (ref 0.33–1.94)
Lambda Free Lght Chn: 12.7 mg/dL — ABNORMAL HIGH (ref 0.57–2.63)

## 2013-09-12 ENCOUNTER — Ambulatory Visit (HOSPITAL_BASED_OUTPATIENT_CLINIC_OR_DEPARTMENT_OTHER): Payer: BC Managed Care – PPO

## 2013-09-12 ENCOUNTER — Ambulatory Visit: Payer: BC Managed Care – PPO

## 2013-09-12 ENCOUNTER — Other Ambulatory Visit (HOSPITAL_BASED_OUTPATIENT_CLINIC_OR_DEPARTMENT_OTHER): Payer: BC Managed Care – PPO

## 2013-09-12 ENCOUNTER — Telehealth: Payer: Self-pay | Admitting: Nurse Practitioner

## 2013-09-12 DIAGNOSIS — C9000 Multiple myeloma not having achieved remission: Secondary | ICD-10-CM

## 2013-09-12 DIAGNOSIS — Z95828 Presence of other vascular implants and grafts: Secondary | ICD-10-CM

## 2013-09-12 DIAGNOSIS — C9002 Multiple myeloma in relapse: Secondary | ICD-10-CM

## 2013-09-12 DIAGNOSIS — Z5112 Encounter for antineoplastic immunotherapy: Secondary | ICD-10-CM

## 2013-09-12 LAB — CBC WITH DIFFERENTIAL/PLATELET
BASO%: 0.7 % (ref 0.0–2.0)
Basophils Absolute: 0 10*3/uL (ref 0.0–0.1)
EOS%: 0.1 % (ref 0.0–7.0)
Eosinophils Absolute: 0 10*3/uL (ref 0.0–0.5)
HCT: 37.9 % — ABNORMAL LOW (ref 38.4–49.9)
HGB: 12.5 g/dL — ABNORMAL LOW (ref 13.0–17.1)
LYMPH%: 8.5 % — AB (ref 14.0–49.0)
MCH: 29.2 pg (ref 27.2–33.4)
MCHC: 33 g/dL (ref 32.0–36.0)
MCV: 88.4 fL (ref 79.3–98.0)
MONO#: 0 10*3/uL — ABNORMAL LOW (ref 0.1–0.9)
MONO%: 0.7 % (ref 0.0–14.0)
NEUT#: 6.1 10*3/uL (ref 1.5–6.5)
NEUT%: 90 % — ABNORMAL HIGH (ref 39.0–75.0)
PLATELETS: 235 10*3/uL (ref 140–400)
RBC: 4.29 10*6/uL (ref 4.20–5.82)
RDW: 16.1 % — AB (ref 11.0–14.6)
WBC: 6.8 10*3/uL (ref 4.0–10.3)
lymph#: 0.6 10*3/uL — ABNORMAL LOW (ref 0.9–3.3)

## 2013-09-12 MED ORDER — SODIUM CHLORIDE 0.9 % IJ SOLN
10.0000 mL | INTRAMUSCULAR | Status: DC | PRN
  Administered 2013-09-12: 10 mL via INTRAVENOUS
  Filled 2013-09-12: qty 10

## 2013-09-12 MED ORDER — HEPARIN SOD (PORK) LOCK FLUSH 100 UNIT/ML IV SOLN
500.0000 [IU] | Freq: Once | INTRAVENOUS | Status: AC | PRN
  Administered 2013-09-12: 500 [IU]
  Filled 2013-09-12: qty 5

## 2013-09-12 MED ORDER — ONDANSETRON 8 MG/NS 50 ML IVPB
INTRAVENOUS | Status: AC
  Filled 2013-09-12: qty 8

## 2013-09-12 MED ORDER — DEXTROSE 5 % IV SOLN
27.0000 mg/m2 | Freq: Once | INTRAVENOUS | Status: AC
  Administered 2013-09-12: 60 mg via INTRAVENOUS
  Filled 2013-09-12: qty 30

## 2013-09-12 MED ORDER — SODIUM CHLORIDE 0.9 % IJ SOLN
10.0000 mL | INTRAMUSCULAR | Status: DC | PRN
  Administered 2013-09-12: 10 mL
  Filled 2013-09-12: qty 10

## 2013-09-12 MED ORDER — SODIUM CHLORIDE 0.9 % IV SOLN
Freq: Once | INTRAVENOUS | Status: AC
  Administered 2013-09-12: 16:00:00 via INTRAVENOUS

## 2013-09-12 MED ORDER — ONDANSETRON 8 MG/50ML IVPB (CHCC)
8.0000 mg | Freq: Once | INTRAVENOUS | Status: AC
  Administered 2013-09-12: 8 mg via INTRAVENOUS

## 2013-09-12 NOTE — Patient Instructions (Signed)

## 2013-09-12 NOTE — Patient Instructions (Signed)
[facility] Discharge Instructions for Patients Receiving Chemotherapy  Today you received the following chemotherapy agents: Kyprolis.  To help prevent nausea and vomiting after your treatment, we encourage you to take your nausea medication: Compazine. Take one every six hours as needed.   If you develop nausea and vomiting that is not controlled by your nausea medication, call the clinic.   BELOW ARE SYMPTOMS THAT SHOULD BE REPORTED IMMEDIATELY:  *FEVER GREATER THAN 100.5 F  *CHILLS WITH OR WITHOUT FEVER  NAUSEA AND VOMITING THAT IS NOT CONTROLLED WITH YOUR NAUSEA MEDICATION  *UNUSUAL SHORTNESS OF BREATH  *UNUSUAL BRUISING OR BLEEDING  TENDERNESS IN MOUTH AND THROAT WITH OR WITHOUT PRESENCE OF ULCERS  *URINARY PROBLEMS  *BOWEL PROBLEMS  UNUSUAL RASH Items with * indicate a potential emergency and should be followed up as soon as possible.  Feel free to call the clinic should you have any questions or concerns. The clinic phone number is [phone].

## 2013-09-12 NOTE — Telephone Encounter (Signed)
This RN called patient to inform him he may come early to his appts today if he would like. We will draw labs in treatment area if necessary. Patient thanks for the call, stating he will make every effort to get here "within the hour."

## 2013-09-19 ENCOUNTER — Other Ambulatory Visit (HOSPITAL_BASED_OUTPATIENT_CLINIC_OR_DEPARTMENT_OTHER): Payer: BC Managed Care – PPO

## 2013-09-19 ENCOUNTER — Ambulatory Visit (HOSPITAL_BASED_OUTPATIENT_CLINIC_OR_DEPARTMENT_OTHER): Payer: BC Managed Care – PPO

## 2013-09-19 DIAGNOSIS — C9000 Multiple myeloma not having achieved remission: Secondary | ICD-10-CM

## 2013-09-19 DIAGNOSIS — Z5112 Encounter for antineoplastic immunotherapy: Secondary | ICD-10-CM

## 2013-09-19 LAB — CBC WITH DIFFERENTIAL/PLATELET
BASO%: 0.6 % (ref 0.0–2.0)
Basophils Absolute: 0.1 10*3/uL (ref 0.0–0.1)
EOS ABS: 0 10*3/uL (ref 0.0–0.5)
EOS%: 0.2 % (ref 0.0–7.0)
HEMATOCRIT: 37.3 % — AB (ref 38.4–49.9)
HGB: 12.2 g/dL — ABNORMAL LOW (ref 13.0–17.1)
LYMPH%: 8.3 % — AB (ref 14.0–49.0)
MCH: 29.3 pg (ref 27.2–33.4)
MCHC: 32.6 g/dL (ref 32.0–36.0)
MCV: 89.8 fL (ref 79.3–98.0)
MONO#: 0.1 10*3/uL (ref 0.1–0.9)
MONO%: 1 % (ref 0.0–14.0)
NEUT#: 7.4 10*3/uL — ABNORMAL HIGH (ref 1.5–6.5)
NEUT%: 89.9 % — ABNORMAL HIGH (ref 39.0–75.0)
PLATELETS: 259 10*3/uL (ref 140–400)
RBC: 4.15 10*6/uL — ABNORMAL LOW (ref 4.20–5.82)
RDW: 16.5 % — ABNORMAL HIGH (ref 11.0–14.6)
WBC: 8.2 10*3/uL (ref 4.0–10.3)
lymph#: 0.7 10*3/uL — ABNORMAL LOW (ref 0.9–3.3)

## 2013-09-19 MED ORDER — HEPARIN SOD (PORK) LOCK FLUSH 100 UNIT/ML IV SOLN
500.0000 [IU] | Freq: Once | INTRAVENOUS | Status: AC | PRN
  Administered 2013-09-19: 500 [IU]
  Filled 2013-09-19: qty 5

## 2013-09-19 MED ORDER — CARFILZOMIB CHEMO INJECTION 60 MG
27.0000 mg/m2 | Freq: Once | INTRAVENOUS | Status: AC
  Administered 2013-09-19: 60 mg via INTRAVENOUS
  Filled 2013-09-19: qty 30

## 2013-09-19 MED ORDER — ESOMEPRAZOLE MAGNESIUM 40 MG PO CPDR: 40.0000 mg | DELAYED_RELEASE_CAPSULE | Freq: Every day | ORAL | Status: DC

## 2013-09-19 MED ORDER — SODIUM CHLORIDE 0.9 % IJ SOLN
10.0000 mL | INTRAMUSCULAR | Status: DC | PRN
  Administered 2013-09-19: 10 mL
  Filled 2013-09-19: qty 10

## 2013-09-19 MED ORDER — ONDANSETRON 8 MG/50ML IVPB (CHCC)
8.0000 mg | Freq: Once | INTRAVENOUS | Status: AC
  Administered 2013-09-19: 8 mg via INTRAVENOUS

## 2013-09-19 MED ORDER — SODIUM CHLORIDE 0.9 % IV SOLN
Freq: Once | INTRAVENOUS | Status: AC
  Administered 2013-09-19: 16:00:00 via INTRAVENOUS

## 2013-09-19 NOTE — Progress Notes (Signed)
Ok to treat with CMET  from 09/05/13 per Dr. Benay Spice.

## 2013-09-19 NOTE — Patient Instructions (Signed)
[facility] Discharge Instructions for Patients Receiving Chemotherapy  Today you received the following chemotherapy agent: Kyprolis  To help prevent nausea and vomiting after your treatment, we encourage you to take your nausea medication as prescribed.    If you develop nausea and vomiting that is not controlled by your nausea medication, call the clinic.   BELOW ARE SYMPTOMS THAT SHOULD BE REPORTED IMMEDIATELY:  *FEVER GREATER THAN 100.5 F  *CHILLS WITH OR WITHOUT FEVER  NAUSEA AND VOMITING THAT IS NOT CONTROLLED WITH YOUR NAUSEA MEDICATION  *UNUSUAL SHORTNESS OF BREATH  *UNUSUAL BRUISING OR BLEEDING  TENDERNESS IN MOUTH AND THROAT WITH OR WITHOUT PRESENCE OF ULCERS  *URINARY PROBLEMS  *BOWEL PROBLEMS  UNUSUAL RASH Items with * indicate a potential emergency and should be followed up as soon as possible.  Feel free to call the clinic you have any questions or concerns. The clinic phone number is [phone].    

## 2013-09-20 ENCOUNTER — Ambulatory Visit (HOSPITAL_BASED_OUTPATIENT_CLINIC_OR_DEPARTMENT_OTHER): Payer: BC Managed Care – PPO

## 2013-09-20 VITALS — BP 133/60 | HR 62 | Temp 98.2°F | Resp 18

## 2013-09-20 DIAGNOSIS — C9000 Multiple myeloma not having achieved remission: Secondary | ICD-10-CM

## 2013-09-20 DIAGNOSIS — Z5112 Encounter for antineoplastic immunotherapy: Secondary | ICD-10-CM

## 2013-09-20 MED ORDER — HEPARIN SOD (PORK) LOCK FLUSH 100 UNIT/ML IV SOLN
500.0000 [IU] | Freq: Once | INTRAVENOUS | Status: AC | PRN
  Administered 2013-09-20: 500 [IU]
  Filled 2013-09-20: qty 5

## 2013-09-20 MED ORDER — SODIUM CHLORIDE 0.9 % IJ SOLN
10.0000 mL | INTRAMUSCULAR | Status: DC | PRN
  Administered 2013-09-20: 10 mL
  Filled 2013-09-20: qty 10

## 2013-09-20 MED ORDER — SODIUM CHLORIDE 0.9 % IV SOLN
Freq: Once | INTRAVENOUS | Status: AC
  Administered 2013-09-20: 16:00:00 via INTRAVENOUS

## 2013-09-20 MED ORDER — DEXTROSE 5 % IV SOLN
27.0000 mg/m2 | Freq: Once | INTRAVENOUS | Status: AC
  Administered 2013-09-20: 60 mg via INTRAVENOUS
  Filled 2013-09-20: qty 30

## 2013-09-20 MED ORDER — ONDANSETRON 8 MG/50ML IVPB (CHCC)
8.0000 mg | Freq: Once | INTRAVENOUS | Status: AC
  Administered 2013-09-20: 8 mg via INTRAVENOUS

## 2013-09-20 MED ORDER — ONDANSETRON 8 MG/NS 50 ML IVPB
INTRAVENOUS | Status: AC
  Filled 2013-09-20: qty 8

## 2013-09-20 MED ORDER — FENTANYL 25 MCG/HR TD PT72: 25.0000 ug | MEDICATED_PATCH | TRANSDERMAL | Status: DC

## 2013-09-24 ENCOUNTER — Other Ambulatory Visit: Payer: Self-pay | Admitting: *Deleted

## 2013-09-24 ENCOUNTER — Telehealth: Payer: Self-pay | Admitting: *Deleted

## 2013-09-24 ENCOUNTER — Other Ambulatory Visit: Payer: Self-pay | Admitting: Oncology

## 2013-09-24 DIAGNOSIS — C9000 Multiple myeloma not having achieved remission: Secondary | ICD-10-CM

## 2013-09-24 NOTE — Telephone Encounter (Signed)
Message from pt requesting return call. Left message on voicemail instructing pt to leave detailed message.

## 2013-09-24 NOTE — Telephone Encounter (Signed)
THIS REFILL REQUEST FOR REVLIMID WAS PLACED ON DR.SHERRILL'S NURSE'S DESK.

## 2013-09-25 ENCOUNTER — Other Ambulatory Visit: Payer: Self-pay | Admitting: *Deleted

## 2013-09-25 DIAGNOSIS — C9000 Multiple myeloma not having achieved remission: Secondary | ICD-10-CM

## 2013-09-25 MED ORDER — LENALIDOMIDE 10 MG PO CAPS: 10.0000 mg | ORAL_CAPSULE | Freq: Every day | ORAL | Status: DC

## 2013-09-27 ENCOUNTER — Encounter: Payer: Self-pay | Admitting: Oncology

## 2013-09-27 NOTE — Progress Notes (Signed)
[employer] approved revlimid from 09/26/13-09/27/14

## 2013-10-01 ENCOUNTER — Other Ambulatory Visit: Payer: Self-pay | Admitting: Oncology

## 2013-10-01 DIAGNOSIS — C9000 Multiple myeloma not having achieved remission: Secondary | ICD-10-CM

## 2013-10-13 ENCOUNTER — Other Ambulatory Visit: Payer: Self-pay | Admitting: Oncology

## 2013-10-17 ENCOUNTER — Other Ambulatory Visit (HOSPITAL_BASED_OUTPATIENT_CLINIC_OR_DEPARTMENT_OTHER): Payer: BC Managed Care – PPO

## 2013-10-17 ENCOUNTER — Ambulatory Visit (HOSPITAL_BASED_OUTPATIENT_CLINIC_OR_DEPARTMENT_OTHER): Payer: BC Managed Care – PPO | Admitting: Oncology

## 2013-10-17 ENCOUNTER — Telehealth: Payer: Self-pay | Admitting: Oncology

## 2013-10-17 ENCOUNTER — Ambulatory Visit (HOSPITAL_BASED_OUTPATIENT_CLINIC_OR_DEPARTMENT_OTHER): Payer: BC Managed Care – PPO

## 2013-10-17 VITALS — BP 126/64 | HR 65 | Temp 98.0°F | Resp 18 | Ht 72.0 in | Wt 219.8 lb

## 2013-10-17 DIAGNOSIS — C9000 Multiple myeloma not having achieved remission: Secondary | ICD-10-CM

## 2013-10-17 DIAGNOSIS — I10 Essential (primary) hypertension: Secondary | ICD-10-CM

## 2013-10-17 DIAGNOSIS — Z95828 Presence of other vascular implants and grafts: Secondary | ICD-10-CM

## 2013-10-17 DIAGNOSIS — R197 Diarrhea, unspecified: Secondary | ICD-10-CM

## 2013-10-17 DIAGNOSIS — Z5112 Encounter for antineoplastic immunotherapy: Secondary | ICD-10-CM

## 2013-10-17 DIAGNOSIS — G620 Drug-induced polyneuropathy: Secondary | ICD-10-CM

## 2013-10-17 LAB — COMPREHENSIVE METABOLIC PANEL (CC13)
ALBUMIN: 3.8 g/dL (ref 3.5–5.0)
ALK PHOS: 48 U/L (ref 40–150)
ALT: 26 U/L (ref 0–55)
AST: 16 U/L (ref 5–34)
Anion Gap: 11 mEq/L (ref 3–11)
BILIRUBIN TOTAL: 0.86 mg/dL (ref 0.20–1.20)
BUN: 16.4 mg/dL (ref 7.0–26.0)
CO2: 25 mEq/L (ref 22–29)
Calcium: 9.4 mg/dL (ref 8.4–10.4)
Chloride: 105 mEq/L (ref 98–109)
Creatinine: 1.1 mg/dL (ref 0.7–1.3)
Glucose: 163 mg/dl — ABNORMAL HIGH (ref 70–140)
POTASSIUM: 4.2 meq/L (ref 3.5–5.1)
Sodium: 140 mEq/L (ref 136–145)
Total Protein: 7.7 g/dL (ref 6.4–8.3)

## 2013-10-17 LAB — CBC WITH DIFFERENTIAL/PLATELET
BASO%: 1 % (ref 0.0–2.0)
Basophils Absolute: 0.1 10*3/uL (ref 0.0–0.1)
EOS%: 0.6 % (ref 0.0–7.0)
Eosinophils Absolute: 0 10*3/uL (ref 0.0–0.5)
HCT: 36.7 % — ABNORMAL LOW (ref 38.4–49.9)
HGB: 12.1 g/dL — ABNORMAL LOW (ref 13.0–17.1)
LYMPH%: 11.5 % — AB (ref 14.0–49.0)
MCH: 29.3 pg (ref 27.2–33.4)
MCHC: 32.9 g/dL (ref 32.0–36.0)
MCV: 89 fL (ref 79.3–98.0)
MONO#: 0.1 10*3/uL (ref 0.1–0.9)
MONO%: 1.2 % (ref 0.0–14.0)
NEUT%: 85.7 % — ABNORMAL HIGH (ref 39.0–75.0)
NEUTROS ABS: 4.3 10*3/uL (ref 1.5–6.5)
Platelets: 257 10*3/uL (ref 140–400)
RBC: 4.13 10*6/uL — AB (ref 4.20–5.82)
RDW: 15.8 % — AB (ref 11.0–14.6)
WBC: 5 10*3/uL (ref 4.0–10.3)
lymph#: 0.6 10*3/uL — ABNORMAL LOW (ref 0.9–3.3)

## 2013-10-17 MED ORDER — HEPARIN SOD (PORK) LOCK FLUSH 100 UNIT/ML IV SOLN
500.0000 [IU] | Freq: Once | INTRAVENOUS | Status: AC | PRN
  Administered 2013-10-17: 500 [IU]
  Filled 2013-10-17: qty 5

## 2013-10-17 MED ORDER — SODIUM CHLORIDE 0.9 % IJ SOLN
10.0000 mL | INTRAMUSCULAR | Status: DC | PRN
  Administered 2013-10-17: 10 mL
  Filled 2013-10-17: qty 10

## 2013-10-17 MED ORDER — DEXTROSE 5 % IV SOLN
27.0000 mg/m2 | Freq: Once | INTRAVENOUS | Status: AC
  Administered 2013-10-17: 60 mg via INTRAVENOUS
  Filled 2013-10-17: qty 30

## 2013-10-17 MED ORDER — SODIUM CHLORIDE 0.9 % IV SOLN
Freq: Once | INTRAVENOUS | Status: AC
  Administered 2013-10-17: 13:00:00 via INTRAVENOUS

## 2013-10-17 MED ORDER — ONDANSETRON 8 MG/50ML IVPB (CHCC)
8.0000 mg | Freq: Once | INTRAVENOUS | Status: AC
  Administered 2013-10-17: 8 mg via INTRAVENOUS

## 2013-10-17 MED ORDER — SODIUM CHLORIDE 0.9 % IJ SOLN
10.0000 mL | INTRAMUSCULAR | Status: DC | PRN
  Administered 2013-10-17: 10 mL via INTRAVENOUS
  Filled 2013-10-17: qty 10

## 2013-10-17 MED ORDER — ONDANSETRON 8 MG/NS 50 ML IVPB
INTRAVENOUS | Status: AC
  Filled 2013-10-17: qty 8

## 2013-10-17 NOTE — Telephone Encounter (Signed)
gv and prnted appt sched and avs fo rpt for Aug and SEpt .Marland Kitchen..sed added tx.

## 2013-10-17 NOTE — Patient Instructions (Signed)
[facility] Discharge Instructions for Patients Receiving Chemotherapy  Today you received the following chemotherapy agents Kyprolis To help prevent nausea and vomiting after your treatment, we encourage you to take your nausea medication as prescribed.  If you develop nausea and vomiting that is not controlled by your nausea medication, call the clinic.   BELOW ARE SYMPTOMS THAT SHOULD BE REPORTED IMMEDIATELY:  *FEVER GREATER THAN 100.5 F  *CHILLS WITH OR WITHOUT FEVER  NAUSEA AND VOMITING THAT IS NOT CONTROLLED WITH YOUR NAUSEA MEDICATION  *UNUSUAL SHORTNESS OF BREATH  *UNUSUAL BRUISING OR BLEEDING  TENDERNESS IN MOUTH AND THROAT WITH OR WITHOUT PRESENCE OF ULCERS  *URINARY PROBLEMS  *BOWEL PROBLEMS  UNUSUAL RASH Items with * indicate a potential emergency and should be followed up as soon as possible.  Feel free to call the clinic you have any questions or concerns. The clinic phone number is [phone].    

## 2013-10-17 NOTE — Patient Instructions (Signed)

## 2013-10-17 NOTE — Telephone Encounter (Signed)
gv adn prnted appt sched adn avs for pt for Aug adn SEpt

## 2013-10-17 NOTE — Progress Notes (Signed)
  Walter Dennis   Diagnosis: Multiple myeloma  INTERVAL HISTORY:   Walter Dennis returns as scheduled. He completed the most recent cycle of carfilzomib on 09/20/2013. He reports feeling well. Stable mild neuropathy symptoms. No significant pain at present except for some xiphoid discomfort with exertion. He has been evaluated by cardiology and had a normal Myoview 08/08/2013. He has dyspnea with the chest discomfort.  He complains of diarrhea while on Revlimid. The diarrhea improves during the week off of Revlimid. He would like to try the next cycle of treatment without Revlimid.  Objective:  Vital signs in last 24 hours:  Blood pressure 126/64, pulse 65, temperature 98 F (36.7 C), temperature source Oral, resp. rate 18, height 6' (1.829 m), weight 219 lb 12.8 oz (99.701 kg), SpO2 98.00%.    HEENT: No thrush or ulcer Resp: Lungs clear bilaterally Cardio: Regular rate and rhythm GI: No hepatosplenomegaly, nontender Vascular: No leg edema   Portacath/PICC-without erythema  Lab Results:  Lab Results  Component Value Date   WBC 5.0 10/17/2013   HGB 12.1* 10/17/2013   HCT 36.7* 10/17/2013   MCV 89.0 10/17/2013   PLT 257 10/17/2013   NEUTROABS 4.3 10/17/2013   09/05/2013-serum M spike 1.34, lambda free light chains 12.7  Medications: I have reviewed the patient's current medications.  Assessment/Plan: 1.Multiple myeloma, IgG lambda monoclonal protein  -Initial diagnosis 2008, bone marrow with a 40-50% plasma cells  -Lenalidomide 25 mg,day 1-21 and Decadron 40 mg weekly and 3235, complicated by peripheral neuropathy  -Therapy change to bortezomib IV twice weekly plus Decadron  -High-dose chemotherapy with autologous stem cell transplantation 2009 at [facility]  -Bortezomib, cyclophosphamide, and Decadron December 2012 through February 2014 (discontinued secondary to pulmonary aspergillosis)  -January 2014 M spike rise to 1.1 g/dL from 0.1 g/dL in December  2013. Treatment started with Carlfilzomib, lenalidomide, and Decadron.  -treatment was restarted with Carlfilzomib, lenalidomide, and Decadron 08/16/2012.  -Restaging labs on 10/15/2012 consistent with improvement in the serum M. Protein.  -Continuation of Carfilzomib/Revlimid/dexamethasone.  -Continued improvement in the serum M spike and IgG level on 11/29/2012.  -Improvement in the serum M spike, slight increase in IgG on 01/03/2013.  -Improvement in the serum M spike, stable IgG, stable lambda free light chains 01/31/2013  -The IgG and serum M spike were slightly higher 02/28/2013, stable lambda light chains  -IgG, M spike, and lambda light chains slightly lower on 04/04/2013.  -Continuation of Carfilzomib/Revlimid/dexamethasone.  -IgG, M spike and lambda light chains stable 05/02/2013.  -IgG, serum M spike, and lambda free light chains stable on 07/11/2013; 08/08/2013.  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 He has been unable to lower the Duragesic patch dose  4. Irritable bowel syndrome  5. Hypertension  6. "Chest pain "and dyspnea with exertion  ,evaluated by cardiology, . Normal stress nuclear study 11/07/2012 and 08/07/2013.  7. Diarrhea. Most likely related to Revlimid    Disposition:  He appears stable. He would like to try a cycle of chemotherapy without Revlimid. The plan is to begin another cycle of Carfilzomib/Decadron today. He will return for an office visit 11/13/2013. We will followup on the myeloma panel from today. The etiology of his chest discomfort and exertional dyspnea remains unclear. He will try decreasing the Duragesic patch by changing to a 4-5 day interval.  Betsy Coder, MD  10/17/2013  12:42 PM

## 2013-10-18 ENCOUNTER — Telehealth: Payer: Self-pay | Admitting: *Deleted

## 2013-10-18 ENCOUNTER — Ambulatory Visit (HOSPITAL_BASED_OUTPATIENT_CLINIC_OR_DEPARTMENT_OTHER): Payer: BC Managed Care – PPO

## 2013-10-18 VITALS — BP 138/59 | HR 60 | Temp 97.6°F | Resp 17

## 2013-10-18 DIAGNOSIS — C9000 Multiple myeloma not having achieved remission: Secondary | ICD-10-CM

## 2013-10-18 DIAGNOSIS — Z5112 Encounter for antineoplastic immunotherapy: Secondary | ICD-10-CM

## 2013-10-18 MED ORDER — ONDANSETRON 8 MG/NS 50 ML IVPB
INTRAVENOUS | Status: AC
  Filled 2013-10-18: qty 8

## 2013-10-18 MED ORDER — LENALIDOMIDE 10 MG PO CAPS: 10.0000 mg | ORAL_CAPSULE | Freq: Every day | ORAL | Status: DC

## 2013-10-18 MED ORDER — DEXTROSE 5 % IV SOLN
27.0000 mg/m2 | Freq: Once | INTRAVENOUS | Status: AC
  Administered 2013-10-18: 60 mg via INTRAVENOUS
  Filled 2013-10-18: qty 30

## 2013-10-18 MED ORDER — HEPARIN SOD (PORK) LOCK FLUSH 100 UNIT/ML IV SOLN
500.0000 [IU] | Freq: Once | INTRAVENOUS | Status: AC | PRN
  Administered 2013-10-18: 500 [IU]
  Filled 2013-10-18: qty 5

## 2013-10-18 MED ORDER — SODIUM CHLORIDE 0.9 % IV SOLN
Freq: Once | INTRAVENOUS | Status: AC
  Administered 2013-10-18: 16:00:00 via INTRAVENOUS

## 2013-10-18 MED ORDER — SODIUM CHLORIDE 0.9 % IJ SOLN
10.0000 mL | INTRAMUSCULAR | Status: DC | PRN
  Administered 2013-10-18: 10 mL
  Filled 2013-10-18: qty 10

## 2013-10-18 MED ORDER — ONDANSETRON 8 MG/50ML IVPB (CHCC)
8.0000 mg | Freq: Once | INTRAVENOUS | Status: AC
  Administered 2013-10-18: 8 mg via INTRAVENOUS

## 2013-10-18 NOTE — Patient Instructions (Signed)
[facility] Discharge Instructions for Patients Receiving Chemotherapy  Today you received the following chemotherapy agents: Kyprolis  To help prevent nausea and vomiting after your treatment, we encourage you to take your nausea medication as prescribed by your physician.   If you develop nausea and vomiting that is not controlled by your nausea medication, call the clinic.   BELOW ARE SYMPTOMS THAT SHOULD BE REPORTED IMMEDIATELY:  *FEVER GREATER THAN 100.5 F  *CHILLS WITH OR WITHOUT FEVER  NAUSEA AND VOMITING THAT IS NOT CONTROLLED WITH YOUR NAUSEA MEDICATION  *UNUSUAL SHORTNESS OF BREATH  *UNUSUAL BRUISING OR BLEEDING  TENDERNESS IN MOUTH AND THROAT WITH OR WITHOUT PRESENCE OF ULCERS  *URINARY PROBLEMS  *BOWEL PROBLEMS  UNUSUAL RASH Items with * indicate a potential emergency and should be followed up as soon as possible.  Feel free to call the clinic you have any questions or concerns. The clinic phone number is [phone].    

## 2013-10-18 NOTE — Telephone Encounter (Signed)
Call from [facility], pt informed him he is off Revlimid for now. They will void this Rx. Will need a new Rx if Revlimid is restarted.

## 2013-10-21 LAB — KAPPA/LAMBDA LIGHT CHAINS
Kappa free light chain: 0.16 mg/dL — ABNORMAL LOW (ref 0.33–1.94)
Kappa:Lambda Ratio: 0.01 — ABNORMAL LOW (ref 0.26–1.65)
Lambda Free Lght Chn: 26.7 mg/dL — ABNORMAL HIGH (ref 0.57–2.63)

## 2013-10-21 LAB — PROTEIN ELECTROPHORESIS, SERUM
ALBUMIN ELP: 52.6 % — AB (ref 55.8–66.1)
Alpha-1-Globulin: 3.8 % (ref 2.9–4.9)
Alpha-2-Globulin: 7.7 % (ref 7.1–11.8)
BETA 2: 26.9 % — AB (ref 3.2–6.5)
BETA GLOBULIN: 6.3 % (ref 4.7–7.2)
Gamma Globulin: 2.7 % — ABNORMAL LOW (ref 11.1–18.8)
M-Spike, %: 1.56 g/dL
TOTAL PROTEIN, SERUM ELECTROPHOR: 7.7 g/dL (ref 6.0–8.3)

## 2013-10-21 LAB — IGG: IgG (Immunoglobin G), Serum: 1980 mg/dL — ABNORMAL HIGH (ref 650–1600)

## 2013-10-24 ENCOUNTER — Other Ambulatory Visit (HOSPITAL_BASED_OUTPATIENT_CLINIC_OR_DEPARTMENT_OTHER): Payer: BC Managed Care – PPO

## 2013-10-24 ENCOUNTER — Ambulatory Visit (HOSPITAL_BASED_OUTPATIENT_CLINIC_OR_DEPARTMENT_OTHER): Payer: BC Managed Care – PPO

## 2013-10-24 ENCOUNTER — Ambulatory Visit: Payer: BC Managed Care – PPO

## 2013-10-24 ENCOUNTER — Other Ambulatory Visit: Payer: Self-pay | Admitting: *Deleted

## 2013-10-24 VITALS — BP 130/86 | HR 68 | Temp 98.0°F | Resp 16

## 2013-10-24 DIAGNOSIS — Z5112 Encounter for antineoplastic immunotherapy: Secondary | ICD-10-CM

## 2013-10-24 DIAGNOSIS — C9000 Multiple myeloma not having achieved remission: Secondary | ICD-10-CM

## 2013-10-24 DIAGNOSIS — Z95828 Presence of other vascular implants and grafts: Secondary | ICD-10-CM

## 2013-10-24 LAB — CBC WITH DIFFERENTIAL/PLATELET
BASO%: 0.5 % (ref 0.0–2.0)
BASOS ABS: 0 10*3/uL (ref 0.0–0.1)
EOS ABS: 0 10*3/uL (ref 0.0–0.5)
EOS%: 0.1 % (ref 0.0–7.0)
HEMATOCRIT: 38.3 % — AB (ref 38.4–49.9)
HEMOGLOBIN: 12.5 g/dL — AB (ref 13.0–17.1)
LYMPH#: 0.6 10*3/uL — AB (ref 0.9–3.3)
LYMPH%: 8 % — ABNORMAL LOW (ref 14.0–49.0)
MCH: 29.1 pg (ref 27.2–33.4)
MCHC: 32.8 g/dL (ref 32.0–36.0)
MCV: 88.7 fL (ref 79.3–98.0)
MONO#: 0.1 10*3/uL (ref 0.1–0.9)
MONO%: 0.7 % (ref 0.0–14.0)
NEUT%: 90.7 % — ABNORMAL HIGH (ref 39.0–75.0)
NEUTROS ABS: 6.9 10*3/uL — AB (ref 1.5–6.5)
Platelets: 236 10*3/uL (ref 140–400)
RBC: 4.32 10*6/uL (ref 4.20–5.82)
RDW: 15.7 % — ABNORMAL HIGH (ref 11.0–14.6)
WBC: 7.6 10*3/uL (ref 4.0–10.3)

## 2013-10-24 MED ORDER — ONDANSETRON 8 MG/50ML IVPB (CHCC)
8.0000 mg | Freq: Once | INTRAVENOUS | Status: AC
  Administered 2013-10-24: 8 mg via INTRAVENOUS

## 2013-10-24 MED ORDER — ONDANSETRON 8 MG/NS 50 ML IVPB
INTRAVENOUS | Status: AC
  Filled 2013-10-24: qty 8

## 2013-10-24 MED ORDER — FENTANYL 25 MCG/HR TD PT72: 25.0000 ug | MEDICATED_PATCH | TRANSDERMAL | Status: DC

## 2013-10-24 MED ORDER — SODIUM CHLORIDE 0.9 % IJ SOLN
10.0000 mL | INTRAMUSCULAR | Status: DC | PRN
  Administered 2013-10-24: 10 mL via INTRAVENOUS
  Filled 2013-10-24: qty 10

## 2013-10-24 MED ORDER — SODIUM CHLORIDE 0.9 % IV SOLN
Freq: Once | INTRAVENOUS | Status: AC
  Administered 2013-10-24: 16:00:00 via INTRAVENOUS

## 2013-10-24 MED ORDER — DEXTROSE 5 % IV SOLN
27.0000 mg/m2 | Freq: Once | INTRAVENOUS | Status: AC
  Administered 2013-10-24: 60 mg via INTRAVENOUS
  Filled 2013-10-24: qty 30

## 2013-10-24 NOTE — Patient Instructions (Signed)

## 2013-10-24 NOTE — Patient Instructions (Signed)
[facility] Discharge Instructions for Patients Receiving Chemotherapy  Today you received the following chemotherapy agents: Kyprolis  To help prevent nausea and vomiting after your treatment, we encourage you to take your nausea medication: as directed.   If you develop nausea and vomiting that is not controlled by your nausea medication, call the clinic.   BELOW ARE SYMPTOMS THAT SHOULD BE REPORTED IMMEDIATELY:  *FEVER GREATER THAN 100.5 F  *CHILLS WITH OR WITHOUT FEVER  NAUSEA AND VOMITING THAT IS NOT CONTROLLED WITH YOUR NAUSEA MEDICATION  *UNUSUAL SHORTNESS OF BREATH  *UNUSUAL BRUISING OR BLEEDING  TENDERNESS IN MOUTH AND THROAT WITH OR WITHOUT PRESENCE OF ULCERS  *URINARY PROBLEMS  *BOWEL PROBLEMS  UNUSUAL RASH Items with * indicate a potential emergency and should be followed up as soon as possible.  Feel free to call the clinic you have any questions or concerns. The clinic phone number is [phone].    

## 2013-10-25 ENCOUNTER — Ambulatory Visit (HOSPITAL_BASED_OUTPATIENT_CLINIC_OR_DEPARTMENT_OTHER): Payer: BC Managed Care – PPO

## 2013-10-25 ENCOUNTER — Ambulatory Visit: Payer: BC Managed Care – PPO

## 2013-10-25 ENCOUNTER — Ambulatory Visit: Payer: BC Managed Care – PPO | Admitting: Cardiovascular Disease

## 2013-10-25 VITALS — BP 122/66 | HR 72 | Temp 97.7°F | Resp 19

## 2013-10-25 DIAGNOSIS — C9000 Multiple myeloma not having achieved remission: Secondary | ICD-10-CM

## 2013-10-25 DIAGNOSIS — Z5112 Encounter for antineoplastic immunotherapy: Secondary | ICD-10-CM

## 2013-10-25 MED ORDER — HEPARIN SOD (PORK) LOCK FLUSH 100 UNIT/ML IV SOLN
500.0000 [IU] | Freq: Once | INTRAVENOUS | Status: AC | PRN
  Administered 2013-10-25: 500 [IU]
  Filled 2013-10-25: qty 5

## 2013-10-25 MED ORDER — DEXTROSE 5 % IV SOLN
27.0000 mg/m2 | Freq: Once | INTRAVENOUS | Status: AC
  Administered 2013-10-25: 60 mg via INTRAVENOUS
  Filled 2013-10-25: qty 30

## 2013-10-25 MED ORDER — SODIUM CHLORIDE 0.9 % IJ SOLN
10.0000 mL | INTRAMUSCULAR | Status: DC | PRN
  Administered 2013-10-25: 10 mL
  Filled 2013-10-25: qty 10

## 2013-10-25 MED ORDER — ONDANSETRON 8 MG/50ML IVPB (CHCC)
8.0000 mg | Freq: Once | INTRAVENOUS | Status: AC
  Administered 2013-10-25: 8 mg via INTRAVENOUS

## 2013-10-25 MED ORDER — SODIUM CHLORIDE 0.9 % IV SOLN
Freq: Once | INTRAVENOUS | Status: AC
  Administered 2013-10-25: 15:00:00 via INTRAVENOUS

## 2013-10-25 MED ORDER — ONDANSETRON 8 MG/NS 50 ML IVPB
INTRAVENOUS | Status: AC
  Filled 2013-10-25: qty 8

## 2013-10-25 NOTE — Patient Instructions (Signed)
[facility] Discharge Instructions for Patients Receiving Chemotherapy  Today you received the following chemotherapy agents Kyprolis To help prevent nausea and vomiting after your treatment, we encourage you to take your nausea medication as prescribed.  If you develop nausea and vomiting that is not controlled by your nausea medication, call the clinic.   BELOW ARE SYMPTOMS THAT SHOULD BE REPORTED IMMEDIATELY:  *FEVER GREATER THAN 100.5 F  *CHILLS WITH OR WITHOUT FEVER  NAUSEA AND VOMITING THAT IS NOT CONTROLLED WITH YOUR NAUSEA MEDICATION  *UNUSUAL SHORTNESS OF BREATH  *UNUSUAL BRUISING OR BLEEDING  TENDERNESS IN MOUTH AND THROAT WITH OR WITHOUT PRESENCE OF ULCERS  *URINARY PROBLEMS  *BOWEL PROBLEMS  UNUSUAL RASH Items with * indicate a potential emergency and should be followed up as soon as possible.  Feel free to call the clinic you have any questions or concerns. The clinic phone number is [phone].    

## 2013-10-29 ENCOUNTER — Other Ambulatory Visit: Payer: Self-pay | Admitting: *Deleted

## 2013-10-29 ENCOUNTER — Other Ambulatory Visit: Payer: Self-pay | Admitting: Oncology

## 2013-10-29 DIAGNOSIS — C9000 Multiple myeloma not having achieved remission: Secondary | ICD-10-CM

## 2013-10-30 ENCOUNTER — Other Ambulatory Visit: Payer: Self-pay | Admitting: *Deleted

## 2013-10-30 DIAGNOSIS — C9000 Multiple myeloma not having achieved remission: Secondary | ICD-10-CM

## 2013-10-30 MED ORDER — PREGABALIN 50 MG PO CAPS: 50.0000 mg | ORAL_CAPSULE | Freq: Two times a day (BID) | ORAL | Status: DC

## 2013-10-31 ENCOUNTER — Ambulatory Visit: Payer: BC Managed Care – PPO

## 2013-10-31 ENCOUNTER — Other Ambulatory Visit (HOSPITAL_BASED_OUTPATIENT_CLINIC_OR_DEPARTMENT_OTHER): Payer: BC Managed Care – PPO

## 2013-10-31 ENCOUNTER — Ambulatory Visit (HOSPITAL_BASED_OUTPATIENT_CLINIC_OR_DEPARTMENT_OTHER): Payer: BC Managed Care – PPO

## 2013-10-31 DIAGNOSIS — C9000 Multiple myeloma not having achieved remission: Secondary | ICD-10-CM

## 2013-10-31 DIAGNOSIS — Z95828 Presence of other vascular implants and grafts: Secondary | ICD-10-CM

## 2013-10-31 DIAGNOSIS — Z5112 Encounter for antineoplastic immunotherapy: Secondary | ICD-10-CM

## 2013-10-31 LAB — CBC WITH DIFFERENTIAL/PLATELET
BASO%: 1.1 % (ref 0.0–2.0)
Basophils Absolute: 0.1 10*3/uL (ref 0.0–0.1)
EOS%: 0.3 % (ref 0.0–7.0)
Eosinophils Absolute: 0 10*3/uL (ref 0.0–0.5)
HCT: 40.5 % (ref 38.4–49.9)
HGB: 13.4 g/dL (ref 13.0–17.1)
LYMPH%: 9.9 % — AB (ref 14.0–49.0)
MCH: 29.3 pg (ref 27.2–33.4)
MCHC: 33.1 g/dL (ref 32.0–36.0)
MCV: 88.7 fL (ref 79.3–98.0)
MONO#: 0.1 10*3/uL (ref 0.1–0.9)
MONO%: 0.7 % (ref 0.0–14.0)
NEUT%: 88 % — ABNORMAL HIGH (ref 39.0–75.0)
NEUTROS ABS: 6.5 10*3/uL (ref 1.5–6.5)
PLATELETS: 258 10*3/uL (ref 140–400)
RBC: 4.56 10*6/uL (ref 4.20–5.82)
RDW: 15.4 % — AB (ref 11.0–14.6)
WBC: 7.4 10*3/uL (ref 4.0–10.3)
lymph#: 0.7 10*3/uL — ABNORMAL LOW (ref 0.9–3.3)

## 2013-10-31 MED ORDER — SODIUM CHLORIDE 0.9 % IV SOLN
Freq: Once | INTRAVENOUS | Status: AC
  Administered 2013-10-31: 15:00:00 via INTRAVENOUS

## 2013-10-31 MED ORDER — ONDANSETRON 8 MG/50ML IVPB (CHCC)
8.0000 mg | Freq: Once | INTRAVENOUS | Status: AC
  Administered 2013-10-31: 8 mg via INTRAVENOUS

## 2013-10-31 MED ORDER — SODIUM CHLORIDE 0.9 % IJ SOLN
10.0000 mL | INTRAMUSCULAR | Status: DC | PRN
Start: 2013-10-31 — End: 2013-10-31
  Filled 2013-10-31: qty 10

## 2013-10-31 MED ORDER — CARFILZOMIB CHEMO INJECTION 60 MG
27.0000 mg/m2 | Freq: Once | INTRAVENOUS | Status: AC
  Administered 2013-10-31: 60 mg via INTRAVENOUS
  Filled 2013-10-31: qty 30

## 2013-10-31 MED ORDER — ONDANSETRON 8 MG/NS 50 ML IVPB
INTRAVENOUS | Status: AC
  Filled 2013-10-31: qty 8

## 2013-10-31 MED ORDER — SODIUM CHLORIDE 0.9 % IJ SOLN
10.0000 mL | INTRAMUSCULAR | Status: DC | PRN
  Administered 2013-10-31: 10 mL via INTRAVENOUS
  Filled 2013-10-31: qty 10

## 2013-10-31 NOTE — Patient Instructions (Signed)
[facility] Discharge Instructions for Patients Receiving Chemotherapy  Today you received the following chemotherapy agents Kyprolis.  To help prevent nausea and vomiting after your treatment, we encourage you to take your nausea medication.   If you develop nausea and vomiting that is not controlled by your nausea medication, call the clinic.   BELOW ARE SYMPTOMS THAT SHOULD BE REPORTED IMMEDIATELY:  *FEVER GREATER THAN 100.5 F  *CHILLS WITH OR WITHOUT FEVER  NAUSEA AND VOMITING THAT IS NOT CONTROLLED WITH YOUR NAUSEA MEDICATION  *UNUSUAL SHORTNESS OF BREATH  *UNUSUAL BRUISING OR BLEEDING  TENDERNESS IN MOUTH AND THROAT WITH OR WITHOUT PRESENCE OF ULCERS  *URINARY PROBLEMS  *BOWEL PROBLEMS  UNUSUAL RASH Items with * indicate a potential emergency and should be followed up as soon as possible.  Feel free to call the clinic you have any questions or concerns. The clinic phone number is [phone].    

## 2013-10-31 NOTE — Patient Instructions (Signed)

## 2013-11-01 ENCOUNTER — Ambulatory Visit: Payer: BC Managed Care – PPO

## 2013-11-01 ENCOUNTER — Telehealth: Payer: Self-pay | Admitting: Oncology

## 2013-11-01 NOTE — Telephone Encounter (Signed)
Cld pt back to r/s no ans lft msg for pt to call bk...KJ

## 2013-11-10 ENCOUNTER — Other Ambulatory Visit: Payer: Self-pay | Admitting: Oncology

## 2013-11-12 ENCOUNTER — Other Ambulatory Visit: Payer: Self-pay | Admitting: Oncology

## 2013-11-12 ENCOUNTER — Telehealth: Payer: Self-pay | Admitting: Oncology

## 2013-11-12 ENCOUNTER — Ambulatory Visit (HOSPITAL_BASED_OUTPATIENT_CLINIC_OR_DEPARTMENT_OTHER): Payer: BC Managed Care – PPO | Admitting: Oncology

## 2013-11-12 ENCOUNTER — Other Ambulatory Visit: Payer: BC Managed Care – PPO

## 2013-11-12 ENCOUNTER — Other Ambulatory Visit: Payer: Self-pay | Admitting: *Deleted

## 2013-11-12 VITALS — BP 136/61 | HR 67 | Temp 97.6°F | Resp 19 | Ht 72.0 in | Wt 221.9 lb

## 2013-11-12 DIAGNOSIS — I10 Essential (primary) hypertension: Secondary | ICD-10-CM

## 2013-11-12 DIAGNOSIS — C9000 Multiple myeloma not having achieved remission: Secondary | ICD-10-CM

## 2013-11-12 DIAGNOSIS — R197 Diarrhea, unspecified: Secondary | ICD-10-CM

## 2013-11-12 DIAGNOSIS — T451X5A Adverse effect of antineoplastic and immunosuppressive drugs, initial encounter: Secondary | ICD-10-CM

## 2013-11-12 DIAGNOSIS — R071 Chest pain on breathing: Secondary | ICD-10-CM

## 2013-11-12 DIAGNOSIS — K589 Irritable bowel syndrome without diarrhea: Secondary | ICD-10-CM

## 2013-11-12 DIAGNOSIS — G622 Polyneuropathy due to other toxic agents: Secondary | ICD-10-CM

## 2013-11-12 NOTE — Telephone Encounter (Signed)
Labs/ov per 09/01 POF, r/s from 09/02 to 09/01 pt aware.Marland Kitchen..KJ

## 2013-11-12 NOTE — Progress Notes (Signed)
[facility] OFFICE PROGRESS NOTE   Diagnosis: Multiple myeloma  INTERVAL HISTORY:   Mr. Wildes returns as scheduled. He is due to begin another cycle of systemic therapy on 11/14/2013. He has noted discomfort at the right lateral chest wall for the past 2 weeks. The pain is present only with certain movements. No pleuritic pain. . No other complaint.  Objective:  Vital signs in last 24 hours:  Blood pressure 136/61, pulse 67, temperature 97.6 F (36.4 C), temperature source Oral, resp. rate 19, height 6' (1.829 m), weight 221 lb 14.4 oz (100.653 kg).    HEENT: No thrush or ulcers Resp: Lungs clear bilaterally Cardio: Regular rate and rhythm GI: No hepatosplenomegaly Vascular: No leg edema Musculoskeletal: No tenderness at the right chest wall. No mass.    Portacath/PICC-without erythema  Lab Results:  Lab Results  Component Value Date   WBC 7.4 10/31/2013   HGB 13.4 10/31/2013   HCT 40.5 10/31/2013   MCV 88.7 10/31/2013   PLT 258 10/31/2013   NEUTROABS 6.5 10/31/2013   10/17/2013-the serum M spike 1.56, IgG 1980, free lambda light chains 26 point  Medications: I have reviewed the patient's current medications.  Assessment/Plan: 1.Multiple myeloma, IgG lambda monoclonal protein  -Initial diagnosis 2008, bone marrow with a 40-50% plasma cells  -Lenalidomide 25 mg,day 1-21 and Decadron 40 mg weekly and 8841, complicated by peripheral neuropathy  -Therapy change to bortezomib IV twice weekly plus Decadron  -High-dose chemotherapy with autologous stem cell transplantation 2009 at [facility]  -Bortezomib, cyclophosphamide, and Decadron December 2012 through February 2014 (discontinued secondary to pulmonary aspergillosis)  -January 2014 M spike rise to 1.1 g/dL from 0.1 g/dL in December 2013. Treatment started with Carlfilzomib, lenalidomide, and Decadron.  -treatment was restarted with Carlfilzomib, lenalidomide, and Decadron 08/16/2012.  -Restaging labs on 10/15/2012  consistent with improvement in the serum M. Protein.  -Continuation of Carfilzomib/Revlimid/dexamethasone.  -Continued improvement in the serum M spike and IgG level on 11/29/2012.  -Improvement in the serum M spike, slight increase in IgG on 01/03/2013.  -Improvement in the serum M spike, stable IgG, stable lambda free light chains 01/31/2013  -The IgG and serum M spike were slightly higher 02/28/2013, stable lambda light chains  -IgG, M spike, and lambda light chains slightly lower on 04/04/2013.  -Continuation of Carfilzomib/Revlimid/dexamethasone.  -IgG, M spike and lambda light chains stable 05/02/2013.  -IgG, serum M spike, and lambda free light chains stable on 07/11/2013; 08/08/2013.  -IgG, serum M spike, and lambda light chains prior 10/17/2013 after being off treatment during July 2015 -Carfilomib/decadron continue with a cycle starting 10/17/2013 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 He has been unable to lower the Duragesic patch dose  4. Irritable bowel syndrome  5. Hypertension  6. "Chest pain "and dyspnea with exertion ,evaluated by cardiology, . Normal stress nuclear study 11/07/2012 and 08/07/2013.  7. Diarrhea. Most likely related to Revlimid  8. right chest wall pain-? Myeloma lesion. He will be referred for a metastatic bone survey.    Disposition:  His overall status appears unchanged, but the myeloma markers were higher last month. He had been maintained off of treatment for one month prior to these labs. He requested discontinuing Revlimid secondary to diarrhea. The plan is to begin another cycle of Carfilzomib/Decadron on 11/14/2013. We will followup on the myeloma panel from 11/14/2013 and decide whether to reintroduce Revlimid.  He will be referred for a metastatic bone survey to include x-rays of  the right chest wall.  Mr. Nehme will return for an office visit 12/12/2013.  Betsy Coder,  MD  11/12/2013  5:15 PM

## 2013-11-12 NOTE — Telephone Encounter (Signed)
Pt confirmed labs/ov per 09/01 POF, sent msg to add chemo, gave pt AVS and advised would mail an updated one once chemo is added...Marland KitchenMarland KitchenKJ

## 2013-11-13 ENCOUNTER — Telehealth: Payer: Self-pay | Admitting: *Deleted

## 2013-11-13 ENCOUNTER — Ambulatory Visit: Payer: BC Managed Care – PPO | Admitting: Oncology

## 2013-11-13 ENCOUNTER — Other Ambulatory Visit: Payer: BC Managed Care – PPO

## 2013-11-13 NOTE — Telephone Encounter (Signed)
Error

## 2013-11-13 NOTE — Telephone Encounter (Signed)
Per staff message and POF I have scheduled appts. Advised scheduler of appts. JMW  

## 2013-11-14 ENCOUNTER — Other Ambulatory Visit (HOSPITAL_BASED_OUTPATIENT_CLINIC_OR_DEPARTMENT_OTHER): Payer: BC Managed Care – PPO

## 2013-11-14 ENCOUNTER — Other Ambulatory Visit: Payer: BC Managed Care – PPO

## 2013-11-14 ENCOUNTER — Ambulatory Visit (HOSPITAL_COMMUNITY)
Admission: RE | Admit: 2013-11-14 | Discharge: 2013-11-14 | Disposition: A | Discharge status: Home / Self Care | Payer: BC Managed Care – PPO | Source: Ambulatory Visit | Attending: Oncology | Admitting: Oncology

## 2013-11-14 ENCOUNTER — Ambulatory Visit (HOSPITAL_BASED_OUTPATIENT_CLINIC_OR_DEPARTMENT_OTHER): Payer: BC Managed Care – PPO

## 2013-11-14 ENCOUNTER — Ambulatory Visit: Payer: BC Managed Care – PPO

## 2013-11-14 VITALS — BP 147/77 | HR 96 | Temp 97.9°F | Resp 18

## 2013-11-14 DIAGNOSIS — Z95828 Presence of other vascular implants and grafts: Secondary | ICD-10-CM

## 2013-11-14 DIAGNOSIS — Z5112 Encounter for antineoplastic immunotherapy: Secondary | ICD-10-CM

## 2013-11-14 DIAGNOSIS — R079 Chest pain, unspecified: Secondary | ICD-10-CM | POA: Diagnosis not present

## 2013-11-14 DIAGNOSIS — C9000 Multiple myeloma not having achieved remission: Secondary | ICD-10-CM

## 2013-11-14 LAB — COMPREHENSIVE METABOLIC PANEL (CC13)
ALBUMIN: 3.7 g/dL (ref 3.5–5.0)
ALK PHOS: 56 U/L (ref 40–150)
ALT: 36 U/L (ref 0–55)
AST: 16 U/L (ref 5–34)
Anion Gap: 13 mEq/L — ABNORMAL HIGH (ref 3–11)
BILIRUBIN TOTAL: 0.54 mg/dL (ref 0.20–1.20)
BUN: 15.4 mg/dL (ref 7.0–26.0)
CO2: 21 meq/L — AB (ref 22–29)
Calcium: 9.4 mg/dL (ref 8.4–10.4)
Chloride: 106 mEq/L (ref 98–109)
Creatinine: 1.3 mg/dL (ref 0.7–1.3)
GLUCOSE: 195 mg/dL — AB (ref 70–140)
Potassium: 4.2 mEq/L (ref 3.5–5.1)
SODIUM: 140 meq/L (ref 136–145)
Total Protein: 7.9 g/dL (ref 6.4–8.3)

## 2013-11-14 LAB — CBC WITH DIFFERENTIAL/PLATELET
BASO%: 1.2 % (ref 0.0–2.0)
Basophils Absolute: 0.1 10*3/uL (ref 0.0–0.1)
EOS%: 0.3 % (ref 0.0–7.0)
Eosinophils Absolute: 0 10*3/uL (ref 0.0–0.5)
HCT: 37.2 % — ABNORMAL LOW (ref 38.4–49.9)
HEMOGLOBIN: 12.4 g/dL — AB (ref 13.0–17.1)
LYMPH#: 0.8 10*3/uL — AB (ref 0.9–3.3)
LYMPH%: 11.2 % — ABNORMAL LOW (ref 14.0–49.0)
MCH: 29.8 pg (ref 27.2–33.4)
MCHC: 33.5 g/dL (ref 32.0–36.0)
MCV: 89 fL (ref 79.3–98.0)
MONO#: 0 10*3/uL — AB (ref 0.1–0.9)
MONO%: 0.6 % (ref 0.0–14.0)
NEUT#: 6.4 10*3/uL (ref 1.5–6.5)
NEUT%: 86.7 % — ABNORMAL HIGH (ref 39.0–75.0)
Platelets: 248 10*3/uL (ref 140–400)
RBC: 4.18 10*6/uL — ABNORMAL LOW (ref 4.20–5.82)
RDW: 15.4 % — AB (ref 11.0–14.6)
WBC: 7.4 10*3/uL (ref 4.0–10.3)

## 2013-11-14 MED ORDER — SODIUM CHLORIDE 0.9 % IJ SOLN
10.0000 mL | INTRAMUSCULAR | Status: DC | PRN
  Administered 2013-11-14: 10 mL
  Filled 2013-11-14: qty 10

## 2013-11-14 MED ORDER — SODIUM CHLORIDE 0.9 % IV SOLN
Freq: Once | INTRAVENOUS | Status: AC
  Administered 2013-11-14: 16:00:00 via INTRAVENOUS

## 2013-11-14 MED ORDER — ONDANSETRON 8 MG/50ML IVPB (CHCC)
8.0000 mg | Freq: Once | INTRAVENOUS | Status: AC
  Administered 2013-11-14: 8 mg via INTRAVENOUS

## 2013-11-14 MED ORDER — DEXTROSE 5 % IV SOLN
27.0000 mg/m2 | Freq: Once | INTRAVENOUS | Status: AC
  Administered 2013-11-14: 60 mg via INTRAVENOUS
  Filled 2013-11-14: qty 30

## 2013-11-14 MED ORDER — HEPARIN SOD (PORK) LOCK FLUSH 100 UNIT/ML IV SOLN
500.0000 [IU] | Freq: Once | INTRAVENOUS | Status: AC | PRN
  Administered 2013-11-14: 500 [IU]
  Filled 2013-11-14: qty 5

## 2013-11-14 MED ORDER — ONDANSETRON 8 MG/NS 50 ML IVPB
INTRAVENOUS | Status: AC
  Filled 2013-11-14: qty 8

## 2013-11-14 MED ORDER — SODIUM CHLORIDE 0.9 % IJ SOLN
10.0000 mL | INTRAMUSCULAR | Status: DC | PRN
  Administered 2013-11-14: 10 mL via INTRAVENOUS
  Filled 2013-11-14: qty 10

## 2013-11-14 NOTE — Progress Notes (Deleted)
Disregard last progress note, ERROR. [place]

## 2013-11-14 NOTE — Addendum Note (Signed)
Addended by: Tyler Aas A on: 11/14/2013 03:28 PM   Modules accepted: Orders, SmartSet

## 2013-11-14 NOTE — Progress Notes (Deleted)
Neulasta inj done, charted and charged under flush appt.

## 2013-11-14 NOTE — Patient Instructions (Signed)
[facility] Discharge Instructions for Patients Receiving Chemotherapy  Today you received the following chemotherapy agent: Kyprolis  To help prevent nausea and vomiting after your treatment, we encourage you to take your nausea medication as prescribed.    If you develop nausea and vomiting that is not controlled by your nausea medication, call the clinic.   BELOW ARE SYMPTOMS THAT SHOULD BE REPORTED IMMEDIATELY:  *FEVER GREATER THAN 100.5 F  *CHILLS WITH OR WITHOUT FEVER  NAUSEA AND VOMITING THAT IS NOT CONTROLLED WITH YOUR NAUSEA MEDICATION  *UNUSUAL SHORTNESS OF BREATH  *UNUSUAL BRUISING OR BLEEDING  TENDERNESS IN MOUTH AND THROAT WITH OR WITHOUT PRESENCE OF ULCERS  *URINARY PROBLEMS  *BOWEL PROBLEMS  UNUSUAL RASH Items with * indicate a potential emergency and should be followed up as soon as possible.  Feel free to call the clinic you have any questions or concerns. The clinic phone number is [phone].    

## 2013-11-14 NOTE — Patient Instructions (Signed)

## 2013-11-15 ENCOUNTER — Telehealth: Payer: Self-pay | Admitting: *Deleted

## 2013-11-15 ENCOUNTER — Ambulatory Visit (HOSPITAL_BASED_OUTPATIENT_CLINIC_OR_DEPARTMENT_OTHER): Payer: BC Managed Care – PPO

## 2013-11-15 VITALS — BP 127/54 | HR 65 | Temp 98.6°F

## 2013-11-15 DIAGNOSIS — C9000 Multiple myeloma not having achieved remission: Secondary | ICD-10-CM

## 2013-11-15 MED ORDER — DEXTROSE 5 % IV SOLN
27.0000 mg/m2 | Freq: Once | INTRAVENOUS | Status: AC
  Administered 2013-11-15: 60 mg via INTRAVENOUS
  Filled 2013-11-15: qty 30

## 2013-11-15 MED ORDER — SODIUM CHLORIDE 0.9 % IJ SOLN
10.0000 mL | INTRAMUSCULAR | Status: DC | PRN
  Administered 2013-11-15: 10 mL via INTRAVENOUS
  Filled 2013-11-15: qty 10

## 2013-11-15 MED ORDER — ONDANSETRON 8 MG/50ML IVPB (CHCC)
8.0000 mg | Freq: Once | INTRAVENOUS | Status: AC
  Administered 2013-11-15: 8 mg via INTRAVENOUS

## 2013-11-15 MED ORDER — SODIUM CHLORIDE 0.9 % IV SOLN
Freq: Once | INTRAVENOUS | Status: AC
  Administered 2013-11-15: 15:00:00 via INTRAVENOUS

## 2013-11-15 MED ORDER — ONDANSETRON 8 MG/NS 50 ML IVPB
INTRAVENOUS | Status: AC
  Filled 2013-11-15: qty 8

## 2013-11-15 MED ORDER — HEPARIN SOD (PORK) LOCK FLUSH 100 UNIT/ML IV SOLN
500.0000 [IU] | Freq: Once | INTRAVENOUS | Status: AC | PRN
  Administered 2013-11-15: 500 [IU]
  Filled 2013-11-15: qty 5

## 2013-11-15 MED ORDER — SODIUM CHLORIDE 0.9 % IJ SOLN
10.0000 mL | INTRAMUSCULAR | Status: DC | PRN
  Administered 2013-11-15: 10 mL
  Filled 2013-11-15: qty 10

## 2013-11-15 NOTE — Telephone Encounter (Signed)
Message copied by Brien Few on Fri Nov 15, 2013 10:05 AM ------      Message from: Ladell Pier      Created: Fri Nov 15, 2013  7:23 AM       Please call patient, bone suvey is negative, call for persistent rt. Chest pain and we will get additional rib films or CT ------

## 2013-11-15 NOTE — Patient Instructions (Signed)
[facility] Discharge Instructions for Patients Receiving Chemotherapy  Today you received the following chemotherapy agents kyprolis  To help prevent nausea and vomiting after your treatment, we encourage you to take your nausea medication as directed   If you develop nausea and vomiting that is not controlled by your nausea medication, call the clinic.   BELOW ARE SYMPTOMS THAT SHOULD BE REPORTED IMMEDIATELY:  *FEVER GREATER THAN 100.5 F  *CHILLS WITH OR WITHOUT FEVER  NAUSEA AND VOMITING THAT IS NOT CONTROLLED WITH YOUR NAUSEA MEDICATION  *UNUSUAL SHORTNESS OF BREATH  *UNUSUAL BRUISING OR BLEEDING  TENDERNESS IN MOUTH AND THROAT WITH OR WITHOUT PRESENCE OF ULCERS  *URINARY PROBLEMS  *BOWEL PROBLEMS  UNUSUAL RASH Items with * indicate a potential emergency and should be followed up as soon as possible.  Feel free to call the clinic you have any questions or concerns. The clinic phone number is [phone].  

## 2013-11-15 NOTE — Telephone Encounter (Signed)
Called pt: bone survey is negative, per Dr. Benay Spice. Call for persistent pain. We will get additional rib films or CT. Pt voiced understanding.

## 2013-11-16 ENCOUNTER — Other Ambulatory Visit: Payer: Self-pay | Admitting: Oncology

## 2013-11-16 DIAGNOSIS — C9000 Multiple myeloma not having achieved remission: Secondary | ICD-10-CM

## 2013-11-19 LAB — PROTEIN ELECTROPHORESIS, SERUM
ALPHA-1-GLOBULIN: 3.7 % (ref 2.9–4.9)
Albumin ELP: 53 % — ABNORMAL LOW (ref 55.8–66.1)
Alpha-2-Globulin: 8.1 % (ref 7.1–11.8)
Beta 2: 26.1 % — ABNORMAL HIGH (ref 3.2–6.5)
Beta Globulin: 6.9 % (ref 4.7–7.2)
Gamma Globulin: 2.2 % — ABNORMAL LOW (ref 11.1–18.8)
M-SPIKE, %: 1.73 g/dL
TOTAL PROTEIN, SERUM ELECTROPHOR: 7.7 g/dL (ref 6.0–8.3)

## 2013-11-19 LAB — KAPPA/LAMBDA LIGHT CHAINS
KAPPA FREE LGHT CHN: 0.62 mg/dL (ref 0.33–1.94)
Kappa:Lambda Ratio: 0.03 — ABNORMAL LOW (ref 0.26–1.65)
LAMBDA FREE LGHT CHN: 20.7 mg/dL — AB (ref 0.57–2.63)

## 2013-11-19 LAB — IGG: IGG (IMMUNOGLOBIN G), SERUM: 2040 mg/dL — AB (ref 650–1600)

## 2013-11-21 ENCOUNTER — Ambulatory Visit (HOSPITAL_BASED_OUTPATIENT_CLINIC_OR_DEPARTMENT_OTHER): Payer: BC Managed Care – PPO

## 2013-11-21 ENCOUNTER — Ambulatory Visit: Payer: BC Managed Care – PPO

## 2013-11-21 ENCOUNTER — Other Ambulatory Visit (HOSPITAL_BASED_OUTPATIENT_CLINIC_OR_DEPARTMENT_OTHER): Payer: BC Managed Care – PPO

## 2013-11-21 VITALS — BP 131/89 | HR 112 | Temp 98.5°F | Resp 18

## 2013-11-21 DIAGNOSIS — C9000 Multiple myeloma not having achieved remission: Secondary | ICD-10-CM

## 2013-11-21 DIAGNOSIS — Z5112 Encounter for antineoplastic immunotherapy: Secondary | ICD-10-CM

## 2013-11-21 DIAGNOSIS — Z95828 Presence of other vascular implants and grafts: Secondary | ICD-10-CM

## 2013-11-21 LAB — CBC WITH DIFFERENTIAL/PLATELET
BASO%: 0.3 % (ref 0.0–2.0)
BASOS ABS: 0 10*3/uL (ref 0.0–0.1)
EOS ABS: 0 10*3/uL (ref 0.0–0.5)
EOS%: 0.3 % (ref 0.0–7.0)
HEMATOCRIT: 37.1 % — AB (ref 38.4–49.9)
HGB: 12.6 g/dL — ABNORMAL LOW (ref 13.0–17.1)
LYMPH%: 9.5 % — ABNORMAL LOW (ref 14.0–49.0)
MCH: 29.6 pg (ref 27.2–33.4)
MCHC: 34 g/dL (ref 32.0–36.0)
MCV: 87.3 fL (ref 79.3–98.0)
MONO#: 0.1 10*3/uL (ref 0.1–0.9)
MONO%: 1.8 % (ref 0.0–14.0)
NEUT#: 6.5 10*3/uL (ref 1.5–6.5)
NEUT%: 88.1 % — ABNORMAL HIGH (ref 39.0–75.0)
Platelets: 215 10*3/uL (ref 140–400)
RBC: 4.25 10*6/uL (ref 4.20–5.82)
RDW: 14.7 % — ABNORMAL HIGH (ref 11.0–14.6)
WBC: 7.4 10*3/uL (ref 4.0–10.3)
lymph#: 0.7 10*3/uL — ABNORMAL LOW (ref 0.9–3.3)
nRBC: 0 % (ref 0–0)

## 2013-11-21 MED ORDER — SODIUM CHLORIDE 0.9 % IV SOLN
Freq: Once | INTRAVENOUS | Status: AC
  Administered 2013-11-21: 16:00:00 via INTRAVENOUS

## 2013-11-21 MED ORDER — ONDANSETRON 8 MG/NS 50 ML IVPB
INTRAVENOUS | Status: AC
  Filled 2013-11-21: qty 8

## 2013-11-21 MED ORDER — SODIUM CHLORIDE 0.9 % IJ SOLN
10.0000 mL | INTRAMUSCULAR | Status: DC | PRN
  Administered 2013-11-21: 10 mL
  Filled 2013-11-21: qty 10

## 2013-11-21 MED ORDER — SODIUM CHLORIDE 0.9 % IJ SOLN
10.0000 mL | INTRAMUSCULAR | Status: DC | PRN
  Administered 2013-11-21: 10 mL via INTRAVENOUS
  Filled 2013-11-21: qty 10

## 2013-11-21 MED ORDER — DEXTROSE 5 % IV SOLN
27.0000 mg/m2 | Freq: Once | INTRAVENOUS | Status: AC
  Administered 2013-11-21: 60 mg via INTRAVENOUS
  Filled 2013-11-21: qty 30

## 2013-11-21 MED ORDER — ONDANSETRON 8 MG/50ML IVPB (CHCC)
8.0000 mg | Freq: Once | INTRAVENOUS | Status: AC
  Administered 2013-11-21: 8 mg via INTRAVENOUS

## 2013-11-21 MED ORDER — HEPARIN SOD (PORK) LOCK FLUSH 100 UNIT/ML IV SOLN
500.0000 [IU] | Freq: Once | INTRAVENOUS | Status: AC | PRN
  Administered 2013-11-21: 500 [IU]
  Filled 2013-11-21: qty 5

## 2013-11-21 NOTE — Progress Notes (Unsigned)
Patient stated after his last chemotherapy last Friday he developed diaphoresis, 4 to 6 episodes of diarrhea, and generalized "bone" pain. Patient stated he had some intermittent left sided chest pain and SOB. Patient states the symptoms resolved on Sunday afternoon. Patient states he has not had any symptoms since. Dr. Benay Spice made aware, this RN was instructed to proceed with treatment today. Patient verbalized understanding that he is to call our office if these symptoms or any other concerning symptoms develop in the future.

## 2013-11-21 NOTE — Patient Instructions (Signed)
[facility] Discharge Instructions for Patients Receiving Chemotherapy  Today you received the following chemotherapy agents Kyprolis To help prevent nausea and vomiting after your treatment, we encourage you to take your nausea medication as prescribed.  If you develop nausea and vomiting that is not controlled by your nausea medication, call the clinic.   BELOW ARE SYMPTOMS THAT SHOULD BE REPORTED IMMEDIATELY:  *FEVER GREATER THAN 100.5 F  *CHILLS WITH OR WITHOUT FEVER  NAUSEA AND VOMITING THAT IS NOT CONTROLLED WITH YOUR NAUSEA MEDICATION  *UNUSUAL SHORTNESS OF BREATH  *UNUSUAL BRUISING OR BLEEDING  TENDERNESS IN MOUTH AND THROAT WITH OR WITHOUT PRESENCE OF ULCERS  *URINARY PROBLEMS  *BOWEL PROBLEMS  UNUSUAL RASH Items with * indicate a potential emergency and should be followed up as soon as possible.  Feel free to call the clinic you have any questions or concerns. The clinic phone number is [phone].    

## 2013-11-21 NOTE — Patient Instructions (Signed)

## 2013-11-22 ENCOUNTER — Ambulatory Visit (HOSPITAL_BASED_OUTPATIENT_CLINIC_OR_DEPARTMENT_OTHER): Payer: BC Managed Care – PPO

## 2013-11-22 VITALS — BP 126/61 | HR 65 | Temp 98.1°F

## 2013-11-22 DIAGNOSIS — C9000 Multiple myeloma not having achieved remission: Secondary | ICD-10-CM

## 2013-11-22 DIAGNOSIS — Z5112 Encounter for antineoplastic immunotherapy: Secondary | ICD-10-CM

## 2013-11-22 MED ORDER — SODIUM CHLORIDE 0.9 % IV SOLN
Freq: Once | INTRAVENOUS | Status: AC
  Administered 2013-11-22: 16:00:00 via INTRAVENOUS

## 2013-11-22 MED ORDER — DEXTROSE 5 % IV SOLN
27.0000 mg/m2 | Freq: Once | INTRAVENOUS | Status: AC
  Administered 2013-11-22: 60 mg via INTRAVENOUS
  Filled 2013-11-22: qty 30

## 2013-11-22 MED ORDER — HEPARIN SOD (PORK) LOCK FLUSH 100 UNIT/ML IV SOLN
500.0000 [IU] | Freq: Once | INTRAVENOUS | Status: AC | PRN
  Administered 2013-11-22: 500 [IU]
  Filled 2013-11-22: qty 5

## 2013-11-22 MED ORDER — SODIUM CHLORIDE 0.9 % IJ SOLN
10.0000 mL | INTRAMUSCULAR | Status: DC | PRN
  Administered 2013-11-22: 10 mL
  Filled 2013-11-22: qty 10

## 2013-11-22 MED ORDER — ONDANSETRON 8 MG/50ML IVPB (CHCC)
8.0000 mg | Freq: Once | INTRAVENOUS | Status: AC
  Administered 2013-11-22: 8 mg via INTRAVENOUS

## 2013-11-22 MED ORDER — ONDANSETRON 8 MG/NS 50 ML IVPB
INTRAVENOUS | Status: AC
  Filled 2013-11-22: qty 8

## 2013-11-22 NOTE — Patient Instructions (Signed)
[facility] Discharge Instructions for Patients Receiving Chemotherapy  Today you received the following chemotherapy agents Kyprolis To help prevent nausea and vomiting after your treatment, we encourage you to take your nausea medication as prescribed.  If you develop nausea and vomiting that is not controlled by your nausea medication, call the clinic.   BELOW ARE SYMPTOMS THAT SHOULD BE REPORTED IMMEDIATELY:  *FEVER GREATER THAN 100.5 F  *CHILLS WITH OR WITHOUT FEVER  NAUSEA AND VOMITING THAT IS NOT CONTROLLED WITH YOUR NAUSEA MEDICATION  *UNUSUAL SHORTNESS OF BREATH  *UNUSUAL BRUISING OR BLEEDING  TENDERNESS IN MOUTH AND THROAT WITH OR WITHOUT PRESENCE OF ULCERS  *URINARY PROBLEMS  *BOWEL PROBLEMS  UNUSUAL RASH Items with * indicate a potential emergency and should be followed up as soon as possible.  Feel free to call the clinic you have any questions or concerns. The clinic phone number is [phone].    

## 2013-11-25 ENCOUNTER — Other Ambulatory Visit: Payer: Self-pay | Admitting: Medical Oncology

## 2013-11-25 DIAGNOSIS — C9000 Multiple myeloma not having achieved remission: Secondary | ICD-10-CM

## 2013-11-25 MED ORDER — FENTANYL 25 MCG/HR TD PT72: 25.0000 ug | MEDICATED_PATCH | TRANSDERMAL | Status: DC

## 2013-11-25 NOTE — Telephone Encounter (Signed)
Pt called requesting fentanyl patch rx refill. Prescription readied for Dr Gearldine Shown signature. Pt will pick up tomorrow.

## 2013-11-28 ENCOUNTER — Ambulatory Visit: Payer: BC Managed Care – PPO

## 2013-11-28 ENCOUNTER — Other Ambulatory Visit (HOSPITAL_BASED_OUTPATIENT_CLINIC_OR_DEPARTMENT_OTHER): Payer: BC Managed Care – PPO

## 2013-11-28 ENCOUNTER — Ambulatory Visit (HOSPITAL_BASED_OUTPATIENT_CLINIC_OR_DEPARTMENT_OTHER): Payer: BC Managed Care – PPO

## 2013-11-28 VITALS — BP 147/90 | HR 109 | Temp 98.1°F

## 2013-11-28 DIAGNOSIS — Z5112 Encounter for antineoplastic immunotherapy: Secondary | ICD-10-CM

## 2013-11-28 DIAGNOSIS — C9000 Multiple myeloma not having achieved remission: Secondary | ICD-10-CM

## 2013-11-28 DIAGNOSIS — Z95828 Presence of other vascular implants and grafts: Secondary | ICD-10-CM

## 2013-11-28 LAB — CBC WITH DIFFERENTIAL/PLATELET
BASO%: 0.6 % (ref 0.0–2.0)
Basophils Absolute: 0 10*3/uL (ref 0.0–0.1)
EOS ABS: 0 10*3/uL (ref 0.0–0.5)
EOS%: 0.2 % (ref 0.0–7.0)
HEMATOCRIT: 38.6 % (ref 38.4–49.9)
HGB: 12.6 g/dL — ABNORMAL LOW (ref 13.0–17.1)
LYMPH%: 10 % — ABNORMAL LOW (ref 14.0–49.0)
MCH: 29.2 pg (ref 27.2–33.4)
MCHC: 32.8 g/dL (ref 32.0–36.0)
MCV: 89.2 fL (ref 79.3–98.0)
MONO#: 0 10*3/uL — AB (ref 0.1–0.9)
MONO%: 0.5 % (ref 0.0–14.0)
NEUT%: 88.7 % — AB (ref 39.0–75.0)
NEUTROS ABS: 6.4 10*3/uL (ref 1.5–6.5)
Platelets: 253 10*3/uL (ref 140–400)
RBC: 4.33 10*6/uL (ref 4.20–5.82)
RDW: 15.3 % — ABNORMAL HIGH (ref 11.0–14.6)
WBC: 7.3 10*3/uL (ref 4.0–10.3)
lymph#: 0.7 10*3/uL — ABNORMAL LOW (ref 0.9–3.3)

## 2013-11-28 MED ORDER — HEPARIN SOD (PORK) LOCK FLUSH 100 UNIT/ML IV SOLN
500.0000 [IU] | Freq: Once | INTRAVENOUS | Status: AC | PRN
  Administered 2013-11-28: 500 [IU]
  Filled 2013-11-28: qty 5

## 2013-11-28 MED ORDER — SODIUM CHLORIDE 0.9 % IJ SOLN
10.0000 mL | INTRAMUSCULAR | Status: DC | PRN
  Administered 2013-11-28: 10 mL via INTRAVENOUS
  Filled 2013-11-28: qty 10

## 2013-11-28 MED ORDER — ONDANSETRON 8 MG/50ML IVPB (CHCC)
8.0000 mg | Freq: Once | INTRAVENOUS | Status: AC
  Administered 2013-11-28: 8 mg via INTRAVENOUS

## 2013-11-28 MED ORDER — SODIUM CHLORIDE 0.9 % IJ SOLN
10.0000 mL | INTRAMUSCULAR | Status: DC | PRN
  Administered 2013-11-28: 10 mL
  Filled 2013-11-28: qty 10

## 2013-11-28 MED ORDER — ONDANSETRON 8 MG/NS 50 ML IVPB
INTRAVENOUS | Status: AC
  Filled 2013-11-28: qty 8

## 2013-11-28 MED ORDER — SODIUM CHLORIDE 0.9 % IV SOLN
Freq: Once | INTRAVENOUS | Status: AC
  Administered 2013-11-28: 16:00:00 via INTRAVENOUS

## 2013-11-28 MED ORDER — DEXTROSE 5 % IV SOLN
27.0000 mg/m2 | Freq: Once | INTRAVENOUS | Status: AC
  Administered 2013-11-28: 60 mg via INTRAVENOUS
  Filled 2013-11-28: qty 30

## 2013-11-28 NOTE — Patient Instructions (Signed)
[facility] Discharge Instructions for Patients Receiving Chemotherapy  Today you received the following chemotherapy agents: Kyprolis.  To help prevent nausea and vomiting after your treatment, we encourage you to take your nausea medication: Zofran 8mg every 12 hours as needed.   If you develop nausea and vomiting that is not controlled by your nausea medication, call the clinic.   BELOW ARE SYMPTOMS THAT SHOULD BE REPORTED IMMEDIATELY:  *FEVER GREATER THAN 100.5 F  *CHILLS WITH OR WITHOUT FEVER  NAUSEA AND VOMITING THAT IS NOT CONTROLLED WITH YOUR NAUSEA MEDICATION  *UNUSUAL SHORTNESS OF BREATH  *UNUSUAL BRUISING OR BLEEDING  TENDERNESS IN MOUTH AND THROAT WITH OR WITHOUT PRESENCE OF ULCERS  *URINARY PROBLEMS  *BOWEL PROBLEMS  UNUSUAL RASH Items with * indicate a potential emergency and should be followed up as soon as possible.  Feel free to call the clinic you have any questions or concerns. The clinic phone number is [phone].    

## 2013-11-28 NOTE — Patient Instructions (Signed)

## 2013-11-29 ENCOUNTER — Other Ambulatory Visit: Payer: Self-pay | Admitting: *Deleted

## 2013-11-29 ENCOUNTER — Ambulatory Visit (HOSPITAL_BASED_OUTPATIENT_CLINIC_OR_DEPARTMENT_OTHER): Payer: BC Managed Care – PPO

## 2013-11-29 VITALS — BP 150/69 | HR 75 | Temp 98.0°F

## 2013-11-29 DIAGNOSIS — Z5112 Encounter for antineoplastic immunotherapy: Secondary | ICD-10-CM

## 2013-11-29 DIAGNOSIS — C9000 Multiple myeloma not having achieved remission: Secondary | ICD-10-CM

## 2013-11-29 MED ORDER — ONDANSETRON 8 MG/50ML IVPB (CHCC)
8.0000 mg | Freq: Once | INTRAVENOUS | Status: AC
  Administered 2013-11-29: 8 mg via INTRAVENOUS

## 2013-11-29 MED ORDER — SODIUM CHLORIDE 0.9 % IV SOLN
Freq: Once | INTRAVENOUS | Status: AC
  Administered 2013-11-29: 16:00:00 via INTRAVENOUS

## 2013-11-29 MED ORDER — ONDANSETRON 8 MG/NS 50 ML IVPB
INTRAVENOUS | Status: AC
  Filled 2013-11-29: qty 8

## 2013-11-29 MED ORDER — HEPARIN SOD (PORK) LOCK FLUSH 100 UNIT/ML IV SOLN
500.0000 [IU] | Freq: Once | INTRAVENOUS | Status: AC | PRN
  Administered 2013-11-29: 500 [IU]
  Filled 2013-11-29: qty 5

## 2013-11-29 MED ORDER — ONDANSETRON HCL 8 MG PO TABS: 8.0000 mg | ORAL_TABLET | Freq: Two times a day (BID) | ORAL | Status: DC | PRN

## 2013-11-29 MED ORDER — DEXTROSE 5 % IV SOLN
27.0000 mg/m2 | Freq: Once | INTRAVENOUS | Status: AC
  Administered 2013-11-29: 60 mg via INTRAVENOUS
  Filled 2013-11-29: qty 30

## 2013-11-29 MED ORDER — SODIUM CHLORIDE 0.9 % IJ SOLN
10.0000 mL | INTRAMUSCULAR | Status: DC | PRN
  Administered 2013-11-29: 10 mL
  Filled 2013-11-29: qty 10

## 2013-11-29 NOTE — Patient Instructions (Signed)
[facility] Discharge Instructions for Patients Receiving Chemotherapy  Today you received the following chemotherapy agents kyprolis  To help prevent nausea and vomiting after your treatment, we encourage you to take your nausea medication as directed   If you develop nausea and vomiting that is not controlled by your nausea medication, call the clinic.   BELOW ARE SYMPTOMS THAT SHOULD BE REPORTED IMMEDIATELY:  *FEVER GREATER THAN 100.5 F  *CHILLS WITH OR WITHOUT FEVER  NAUSEA AND VOMITING THAT IS NOT CONTROLLED WITH YOUR NAUSEA MEDICATION  *UNUSUAL SHORTNESS OF BREATH  *UNUSUAL BRUISING OR BLEEDING  TENDERNESS IN MOUTH AND THROAT WITH OR WITHOUT PRESENCE OF ULCERS  *URINARY PROBLEMS  *BOWEL PROBLEMS  UNUSUAL RASH Items with * indicate a potential emergency and should be followed up as soon as possible.  Feel free to call the clinic you have any questions or concerns. The clinic phone number is [phone].  

## 2013-12-05 ENCOUNTER — Ambulatory Visit (HOSPITAL_COMMUNITY)
Admission: RE | Admit: 2013-12-05 | Discharge: 2013-12-05 | Disposition: A | Discharge status: Home / Self Care | Payer: BC Managed Care – PPO | Source: Ambulatory Visit | Attending: Nurse Practitioner | Admitting: Nurse Practitioner

## 2013-12-05 ENCOUNTER — Ambulatory Visit: Payer: BC Managed Care – PPO

## 2013-12-05 ENCOUNTER — Ambulatory Visit (HOSPITAL_BASED_OUTPATIENT_CLINIC_OR_DEPARTMENT_OTHER): Payer: BC Managed Care – PPO | Admitting: Nurse Practitioner

## 2013-12-05 ENCOUNTER — Ambulatory Visit (HOSPITAL_BASED_OUTPATIENT_CLINIC_OR_DEPARTMENT_OTHER): Payer: BC Managed Care – PPO

## 2013-12-05 VITALS — BP 147/75 | HR 94 | Temp 100.6°F | Resp 18 | Ht 72.0 in | Wt 226.0 lb

## 2013-12-05 DIAGNOSIS — C9000 Multiple myeloma not having achieved remission: Secondary | ICD-10-CM | POA: Diagnosis not present

## 2013-12-05 DIAGNOSIS — R509 Fever, unspecified: Secondary | ICD-10-CM

## 2013-12-05 DIAGNOSIS — K589 Irritable bowel syndrome without diarrhea: Secondary | ICD-10-CM

## 2013-12-05 DIAGNOSIS — I10 Essential (primary) hypertension: Secondary | ICD-10-CM

## 2013-12-05 DIAGNOSIS — R05 Cough: Secondary | ICD-10-CM | POA: Insufficient documentation

## 2013-12-05 DIAGNOSIS — R197 Diarrhea, unspecified: Secondary | ICD-10-CM

## 2013-12-05 DIAGNOSIS — R059 Cough, unspecified: Secondary | ICD-10-CM | POA: Diagnosis not present

## 2013-12-05 DIAGNOSIS — G609 Hereditary and idiopathic neuropathy, unspecified: Secondary | ICD-10-CM

## 2013-12-05 DIAGNOSIS — R071 Chest pain on breathing: Secondary | ICD-10-CM

## 2013-12-05 DIAGNOSIS — Z95828 Presence of other vascular implants and grafts: Secondary | ICD-10-CM

## 2013-12-05 LAB — COMPREHENSIVE METABOLIC PANEL (CC13)
ALT: 46 U/L (ref 0–55)
ANION GAP: 11 meq/L (ref 3–11)
AST: 28 U/L (ref 5–34)
Albumin: 3.6 g/dL (ref 3.5–5.0)
Alkaline Phosphatase: 64 U/L (ref 40–150)
BUN: 12.4 mg/dL (ref 7.0–26.0)
CO2: 23 mEq/L (ref 22–29)
CREATININE: 1.1 mg/dL (ref 0.7–1.3)
Calcium: 9.3 mg/dL (ref 8.4–10.4)
Chloride: 103 mEq/L (ref 98–109)
GLUCOSE: 87 mg/dL (ref 70–140)
Potassium: 4 mEq/L (ref 3.5–5.1)
Sodium: 137 mEq/L (ref 136–145)
Total Bilirubin: 0.93 mg/dL (ref 0.20–1.20)
Total Protein: 7.8 g/dL (ref 6.4–8.3)

## 2013-12-05 LAB — CBC WITH DIFFERENTIAL/PLATELET
BASO%: 0.8 % (ref 0.0–2.0)
Basophils Absolute: 0.1 10*3/uL (ref 0.0–0.1)
EOS ABS: 0.1 10*3/uL (ref 0.0–0.5)
EOS%: 2.3 % (ref 0.0–7.0)
HCT: 36.7 % — ABNORMAL LOW (ref 38.4–49.9)
HGB: 12.4 g/dL — ABNORMAL LOW (ref 13.0–17.1)
LYMPH%: 14.8 % (ref 14.0–49.0)
MCH: 29.6 pg (ref 27.2–33.4)
MCHC: 33.8 g/dL (ref 32.0–36.0)
MCV: 87.6 fL (ref 79.3–98.0)
MONO#: 0.5 10*3/uL (ref 0.1–0.9)
MONO%: 7.4 % (ref 0.0–14.0)
NEUT#: 4.6 10*3/uL (ref 1.5–6.5)
NEUT%: 74.7 % (ref 39.0–75.0)
NRBC: 0 % (ref 0–0)
Platelets: 217 10*3/uL (ref 140–400)
RBC: 4.19 10*6/uL — AB (ref 4.20–5.82)
RDW: 14.6 % (ref 11.0–14.6)
WBC: 6.2 10*3/uL (ref 4.0–10.3)
lymph#: 0.9 10*3/uL (ref 0.9–3.3)

## 2013-12-05 LAB — INFLUENZA A AND B
INFLUENZA B AG: NEGATIVE
Inflenza A Ag: NEGATIVE

## 2013-12-05 LAB — URINALYSIS, MICROSCOPIC - CHCC
Bilirubin (Urine): NEGATIVE
Blood: NEGATIVE
GLUCOSE UR CHCC: NEGATIVE mg/dL
Ketones: NEGATIVE mg/dL
LEUKOCYTE ESTERASE: NEGATIVE
NITRITE: NEGATIVE
PROTEIN: 30 mg/dL
RBC / HPF: NEGATIVE (ref 0–2)
Specific Gravity, Urine: 1.01 (ref 1.003–1.035)
UROBILINOGEN UR: 0.2 mg/dL (ref 0.2–1)
WBC UA: NEGATIVE (ref 0–2)
pH: 6 (ref 4.6–8.0)

## 2013-12-05 MED ORDER — LEVOFLOXACIN 500 MG PO TABS: 500.0000 mg | ORAL_TABLET | Freq: Every day | ORAL | Status: DC

## 2013-12-05 MED ORDER — OSELTAMIVIR PHOSPHATE 75 MG PO CAPS: 75.0000 mg | ORAL_CAPSULE | Freq: Two times a day (BID) | ORAL | Status: DC

## 2013-12-05 MED ORDER — HEPARIN SOD (PORK) LOCK FLUSH 100 UNIT/ML IV SOLN
500.0000 [IU] | Freq: Once | INTRAVENOUS | Status: AC
  Administered 2013-12-05: 500 [IU] via INTRAVENOUS
  Filled 2013-12-05: qty 5

## 2013-12-05 MED ORDER — SODIUM CHLORIDE 0.9 % IJ SOLN
10.0000 mL | INTRAMUSCULAR | Status: DC | PRN
  Administered 2013-12-05: 10 mL via INTRAVENOUS
  Filled 2013-12-05: qty 10

## 2013-12-05 NOTE — Progress Notes (Addendum)
[facility] OFFICE PROGRESS NOTE   Diagnosis:  Multiple myeloma  INTERVAL HISTORY:   [name] returns prior to scheduled followup. He reports worsening of generalized achy pain which he describes as "flulike". The pain has worsened over the past month and significantly worsened over the past approximate week. He denies fever or shaking chills. He notes worsening dyspnea on exertion over the past one week. He continues to have a "burning" sensation across the chest with exertion. No cough. He feels "bloated". He has been having diarrhea for "months". No sore throat. No unusual headaches. No rash. He is having difficulty sleeping.   Objective:  Vital signs in last 24 hours:  Blood pressure 147/75, pulse 94, temperature 100.6 F (38.1 C), temperature source Oral, resp. rate 18, height 6' (1.829 m), weight 226 lb (102.513 kg), SpO2 99.00%.    HEENT: No thrush or ulcers. Mucous membranes are moist. Lymphatics: No palpable cervical or supraclavicular lymph nodes. Resp: Lungs are clear. No respiratory distress. Cardio: Regular rhythm. Mildly tachycardic. GI: Abdomen is soft and nontender. No organomegaly. Vascular: No leg edema. Neuro: Alert and oriented. Follows commands.  Skin: No rash. Port-A-Cath without erythema.    Lab Results:  Lab Results  Component Value Date   WBC 7.3 11/28/2013   HGB 12.6* 11/28/2013   HCT 38.6 11/28/2013   MCV 89.2 11/28/2013   PLT 253 11/28/2013   NEUTROABS 6.4 11/28/2013    Imaging:  No results found.  Medications: I have reviewed the patient's current medications.  Assessment/Plan: 1.Multiple myeloma, IgG lambda monoclonal protein  -Initial diagnosis 2008, bone marrow with a 40-50% plasma cells  -Lenalidomide 25 mg,day 1-21 and Decadron 40 mg weekly and 8676, complicated by peripheral neuropathy  -Therapy change to bortezomib IV twice weekly plus Decadron  -High-dose chemotherapy with autologous stem cell transplantation 2009 at  [facility]  -Bortezomib, cyclophosphamide, and Decadron December 2012 through February 2014 (discontinued secondary to pulmonary aspergillosis)  -January 2014 M spike rise to 1.1 g/dL from 0.1 g/dL in December 2013. Treatment started with Carlfilzomib, lenalidomide, and Decadron.  -treatment was restarted with Carlfilzomib, lenalidomide, and Decadron 08/16/2012.  -Restaging labs on 10/15/2012 consistent with improvement in the serum M. Protein.  -Continuation of Carfilzomib/Revlimid/dexamethasone.  -Continued improvement in the serum M spike and IgG level on 11/29/2012.  -Improvement in the serum M spike, slight increase in IgG on 01/03/2013.  -Improvement in the serum M spike, stable IgG, stable lambda free light chains 01/31/2013  -The IgG and serum M spike were slightly higher 02/28/2013, stable lambda light chains  -IgG, M spike, and lambda light chains slightly lower on 04/04/2013.  -Continuation of Carfilzomib/Revlimid/dexamethasone.  -IgG, M spike and lambda light chains stable 05/02/2013.  -IgG, serum M spike, and lambda free light chains stable on 07/11/2013; 08/08/2013.  -IgG, serum M spike, and lambda light chains prior 10/17/2013 after being off treatment during July 2015  -Carfilomib/decadron continue with a cycle starting 10/17/2013  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 He has been unable to lower the Duragesic patch dose  4. Irritable bowel syndrome  5. Hypertension  6. "Chest pain "and dyspnea with exertion ,evaluated by cardiology, . Normal stress nuclear study 11/07/2012 and 08/07/2013.  7. Diarrhea. Most likely related to Revlimid  8. right chest wall pain-? Myeloma lesion. He will be referred for a metastatic bone survey.    Disposition: Mr. Sheahan has a febrile illness. He does not appear acutely ill. He understands that he  is significantly immunocompromised and is at increased risk for infections, including  atypical infections. We discussed hospitalization which he declines. If his condition worsens he would be agreeable to admission to the hospital.  We will obtain a CBC, chemistry panel, blood culture from the Port-A-Cath, nasal swab for influenza A and B, urinalysis and culture and a chest x-ray. He will begin Levaquin 500 mg daily and Tamiflu 75 mg twice daily for 5 days.  He is scheduled to return for a followup visit on 12/12/2013. He will contact the office in the interim with any change in his condition.  Patient seen with Dr. Benay Spice. 30 minutes were spent face-to-face at today's visit with the majority of that time involving counseling/coordination of care.      Ned Card ANP/GNP-BC   12/05/2013  11:19 AM  This was a shared visit with Ned Card. Mr. Graffam presents with a febrile illness that has progressed over the past week. I suspect he has a systemic viral infection, but his symptoms could be related to a bacterial infection or myeloma. We will obtain a chest x-ray, blood culture, and influenza swab. We will start him on Tamiflu and Levaquin. He knows to contact us for persistent fever, pain, or new symptoms. He will need to be admitted if the symptoms persist or worsen.  Julieanne Manson, M.D.

## 2013-12-05 NOTE — Progress Notes (Signed)
Documented PAC flush in MD visit.

## 2013-12-05 NOTE — Patient Instructions (Signed)

## 2013-12-06 ENCOUNTER — Telehealth: Payer: Self-pay | Admitting: *Deleted

## 2013-12-06 LAB — URINE CULTURE

## 2013-12-06 NOTE — Telephone Encounter (Signed)
Wife and patient called and left VM requesting results of lab work from yesterday. Unable to reach [facility], so spoke with wife and made her aware that all testing returned negative. She reports he is feeling no better today and "this has been going on for weeks". Says he is extremely tired and sleeps "all the time". Temp is normal today and he can eat and drink. She is asking if the chemo is causing this?

## 2013-12-11 LAB — CULTURE, BLOOD (SINGLE)

## 2013-12-12 ENCOUNTER — Ambulatory Visit: Payer: BC Managed Care – PPO

## 2013-12-12 ENCOUNTER — Ambulatory Visit (HOSPITAL_BASED_OUTPATIENT_CLINIC_OR_DEPARTMENT_OTHER): Payer: BC Managed Care – PPO

## 2013-12-12 ENCOUNTER — Ambulatory Visit (HOSPITAL_BASED_OUTPATIENT_CLINIC_OR_DEPARTMENT_OTHER): Payer: BC Managed Care – PPO | Admitting: Oncology

## 2013-12-12 ENCOUNTER — Other Ambulatory Visit (HOSPITAL_BASED_OUTPATIENT_CLINIC_OR_DEPARTMENT_OTHER): Payer: BC Managed Care – PPO

## 2013-12-12 VITALS — BP 131/82 | HR 111 | Temp 98.6°F | Resp 18 | Ht 72.0 in | Wt 222.8 lb

## 2013-12-12 DIAGNOSIS — C9000 Multiple myeloma not having achieved remission: Secondary | ICD-10-CM

## 2013-12-12 DIAGNOSIS — Z23 Encounter for immunization: Secondary | ICD-10-CM

## 2013-12-12 DIAGNOSIS — Z5112 Encounter for antineoplastic immunotherapy: Secondary | ICD-10-CM

## 2013-12-12 DIAGNOSIS — Z95828 Presence of other vascular implants and grafts: Secondary | ICD-10-CM

## 2013-12-12 DIAGNOSIS — G620 Drug-induced polyneuropathy: Secondary | ICD-10-CM

## 2013-12-12 LAB — CBC WITH DIFFERENTIAL/PLATELET
BASO%: 0.3 % (ref 0.0–2.0)
Basophils Absolute: 0 10*3/uL (ref 0.0–0.1)
EOS%: 0.2 % (ref 0.0–7.0)
Eosinophils Absolute: 0 10*3/uL (ref 0.0–0.5)
HCT: 36.1 % — ABNORMAL LOW (ref 38.4–49.9)
HGB: 12.2 g/dL — ABNORMAL LOW (ref 13.0–17.1)
LYMPH%: 11.1 % — AB (ref 14.0–49.0)
MCH: 29.6 pg (ref 27.2–33.4)
MCHC: 33.8 g/dL (ref 32.0–36.0)
MCV: 87.6 fL (ref 79.3–98.0)
MONO#: 0 10*3/uL — ABNORMAL LOW (ref 0.1–0.9)
MONO%: 0.7 % (ref 0.0–14.0)
NEUT#: 5.2 10*3/uL (ref 1.5–6.5)
NEUT%: 87.7 % — ABNORMAL HIGH (ref 39.0–75.0)
NRBC: 0 % (ref 0–0)
Platelets: 251 10*3/uL (ref 140–400)
RBC: 4.12 10*6/uL — AB (ref 4.20–5.82)
RDW: 14.3 % (ref 11.0–14.6)
WBC: 6 10*3/uL (ref 4.0–10.3)
lymph#: 0.7 10*3/uL — ABNORMAL LOW (ref 0.9–3.3)

## 2013-12-12 LAB — COMPREHENSIVE METABOLIC PANEL (CC13)
ALT: 26 U/L (ref 0–55)
ANION GAP: 10 meq/L (ref 3–11)
AST: 17 U/L (ref 5–34)
Albumin: 3.7 g/dL (ref 3.5–5.0)
Alkaline Phosphatase: 56 U/L (ref 40–150)
BILIRUBIN TOTAL: 0.52 mg/dL (ref 0.20–1.20)
BUN: 16.2 mg/dL (ref 7.0–26.0)
CO2: 23 meq/L (ref 22–29)
CREATININE: 1.3 mg/dL (ref 0.7–1.3)
Calcium: 9.9 mg/dL (ref 8.4–10.4)
Chloride: 106 mEq/L (ref 98–109)
Glucose: 214 mg/dl — ABNORMAL HIGH (ref 70–140)
Potassium: 4.1 mEq/L (ref 3.5–5.1)
Sodium: 139 mEq/L (ref 136–145)
Total Protein: 8.2 g/dL (ref 6.4–8.3)

## 2013-12-12 MED ORDER — DEXAMETHASONE 4 MG PO TABS: 20.0000 mg | ORAL_TABLET | ORAL | Status: DC

## 2013-12-12 MED ORDER — INFLUENZA VAC SPLIT QUAD 0.5 ML IM SUSY
0.5000 mL | PREFILLED_SYRINGE | Freq: Once | INTRAMUSCULAR | Status: AC
  Administered 2013-12-12: 0.5 mL via INTRAMUSCULAR
  Filled 2013-12-12: qty 0.5

## 2013-12-12 MED ORDER — ONDANSETRON 8 MG/NS 50 ML IVPB
INTRAVENOUS | Status: AC
  Filled 2013-12-12: qty 8

## 2013-12-12 MED ORDER — SODIUM CHLORIDE 0.9 % IJ SOLN
10.0000 mL | INTRAMUSCULAR | Status: DC | PRN
  Administered 2013-12-12: 10 mL
  Filled 2013-12-12: qty 10

## 2013-12-12 MED ORDER — SODIUM CHLORIDE 0.9 % IJ SOLN
10.0000 mL | INTRAMUSCULAR | Status: DC | PRN
  Administered 2013-12-12: 10 mL via INTRAVENOUS
  Filled 2013-12-12: qty 10

## 2013-12-12 MED ORDER — CARFILZOMIB CHEMO INJECTION 60 MG
27.0000 mg/m2 | Freq: Once | INTRAVENOUS | Status: AC
  Administered 2013-12-12: 60 mg via INTRAVENOUS
  Filled 2013-12-12: qty 30

## 2013-12-12 MED ORDER — HEPARIN SOD (PORK) LOCK FLUSH 100 UNIT/ML IV SOLN
500.0000 [IU] | Freq: Once | INTRAVENOUS | Status: AC | PRN
  Administered 2013-12-12: 500 [IU]
  Filled 2013-12-12: qty 5

## 2013-12-12 MED ORDER — ONDANSETRON 8 MG/50ML IVPB (CHCC)
8.0000 mg | Freq: Once | INTRAVENOUS | Status: AC
  Administered 2013-12-12: 8 mg via INTRAVENOUS

## 2013-12-12 MED ORDER — SODIUM CHLORIDE 0.9 % IV SOLN
Freq: Once | INTRAVENOUS | Status: AC
  Administered 2013-12-12: 17:00:00 via INTRAVENOUS

## 2013-12-12 NOTE — Progress Notes (Signed)
[place] OFFICE PROGRESS NOTE   Diagnosis: Multiple myeloma  INTERVAL HISTORY:   Walter Dennis returns as scheduled. We saw him last week with a febrile illness. An influenza swab and cultures of the blood and urine returned negative. He is completing a course of Levaquin. No further fever. He continues to have increased pain diffusely. He also reports increased neuropathy symptoms in the right foot. He is working.  Objective:  Vital signs in last 24 hours:  Blood pressure 131/82, pulse 111, temperature 98.6 F (37 C), temperature source Oral, resp. rate 18, height 6' (1.829 m), weight 222 lb 12.8 oz (101.061 kg).    HEENT: No thrush Resp: Lungs clear bilaterally Cardio: Regular rate and rhythm GI: No hepatosplenomegaly, nontender Vascular: No leg edema   Portacath/PICC-without erythema  Lab Results:  Lab Results  Component Value Date   WBC 6.0 12/12/2013   HGB 12.2* 12/12/2013   HCT 36.1* 12/12/2013   MCV 87.6 12/12/2013   PLT 251 12/12/2013   NEUTROABS 5.2 12/12/2013     Imaging:  No results found.  Medications: I have reviewed the patient's current medications.  Assessment/Plan: 1.Multiple myeloma, IgG lambda monoclonal protein  -Initial diagnosis 2008, bone marrow with a 40-50% plasma cells  -Lenalidomide 25 mg,day 1-21 and Decadron 40 mg weekly and 9798, complicated by peripheral neuropathy  -Therapy change to bortezomib IV twice weekly plus Decadron  -High-dose chemotherapy with autologous stem cell transplantation 2009 at [facility]  -Bortezomib, cyclophosphamide, and Decadron December 2012 through February 2014 (discontinued secondary to pulmonary aspergillosis)  -January 2014 M spike rise to 1.1 g/dL from 0.1 g/dL in December 2013. Treatment started with Carlfilzomib, lenalidomide, and Decadron.  -treatment was restarted with Carlfilzomib, lenalidomide, and Decadron 08/16/2012.  -Restaging labs on 10/15/2012 consistent with improvement in the serum  M. Protein.  -Continuation of Carfilzomib/Revlimid/dexamethasone.  -Continued improvement in the serum M spike and IgG level on 11/29/2012.  -Improvement in the serum M spike, slight increase in IgG on 01/03/2013.  -Improvement in the serum M spike, stable IgG, stable lambda free light chains 01/31/2013  -The IgG and serum M spike were slightly higher 02/28/2013, stable lambda light chains  -IgG, M spike, and lambda light chains slightly lower on 04/04/2013.  -Continuation of Carfilzomib/Revlimid/dexamethasone.  -IgG, M spike and lambda light chains stable 05/02/2013.  -IgG, serum M spike, and lambda free light chains stable on 07/11/2013; 08/08/2013.  -IgG, serum M spike, and lambda light chains prior 10/17/2013 after being off treatment during July 2015  -Carfilomib/decadron continue with a cycle starting 10/17/2013  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 He has been unable to lower the Duragesic patch dose  4. Irritable bowel syndrome  5. Hypertension  6. "Chest pain "and dyspnea with exertion ,evaluated by cardiology, . Normal stress nuclear study 11/07/2012 and 08/07/2013.  7. history of Diarrhea. Most likely related to Revlimid  8. febrile illness 12/05/2013 with associated diffuse bone pain-no source for infection identified    Disposition:  [name] appears stable today. There was no apparent source for infection identified when we evaluated the fever last week. I remain concerned the increased pain is related to progression of the multiple myeloma. The serum M spike and IgG have been higher over the past month. We will followup on the levels from today. If the IgG and M spike were higher Revlimid will be reintroduced into the systemic therapy regimen. If this does not help we will need to consider  switching to a different systemic therapy.  [name] would like to continue Carfilzomib for now.  He will return for an  office visit in one month. We will contact him with results of the IgG and M spike from today.  He will receive an influenza vaccine today.  Betsy Coder, MD  12/12/2013  4:26 PM

## 2013-12-12 NOTE — Patient Instructions (Signed)

## 2013-12-12 NOTE — Patient Instructions (Signed)
[facility] Discharge Instructions for Patients Receiving Chemotherapy  Today you received the following chemotherapy agents: Kyprolis  To help prevent nausea and vomiting after your treatment, we encourage you to take your nausea medication as prescribed by your physician.   If you develop nausea and vomiting that is not controlled by your nausea medication, call the clinic.   BELOW ARE SYMPTOMS THAT SHOULD BE REPORTED IMMEDIATELY:  *FEVER GREATER THAN 100.5 F  *CHILLS WITH OR WITHOUT FEVER  NAUSEA AND VOMITING THAT IS NOT CONTROLLED WITH YOUR NAUSEA MEDICATION  *UNUSUAL SHORTNESS OF BREATH  *UNUSUAL BRUISING OR BLEEDING  TENDERNESS IN MOUTH AND THROAT WITH OR WITHOUT PRESENCE OF ULCERS  *URINARY PROBLEMS  *BOWEL PROBLEMS  UNUSUAL RASH Items with * indicate a potential emergency and should be followed up as soon as possible.  Feel free to call the clinic you have any questions or concerns. The clinic phone number is [phone].    

## 2013-12-13 ENCOUNTER — Ambulatory Visit (HOSPITAL_BASED_OUTPATIENT_CLINIC_OR_DEPARTMENT_OTHER): Payer: BC Managed Care – PPO

## 2013-12-13 ENCOUNTER — Telehealth: Payer: Self-pay | Admitting: Oncology

## 2013-12-13 VITALS — BP 113/62 | HR 75 | Temp 98.2°F | Resp 18

## 2013-12-13 DIAGNOSIS — Z5112 Encounter for antineoplastic immunotherapy: Secondary | ICD-10-CM

## 2013-12-13 DIAGNOSIS — C9000 Multiple myeloma not having achieved remission: Secondary | ICD-10-CM

## 2013-12-13 MED ORDER — ONDANSETRON 8 MG/50ML IVPB (CHCC)
8.0000 mg | Freq: Once | INTRAVENOUS | Status: AC
  Administered 2013-12-13: 8 mg via INTRAVENOUS

## 2013-12-13 MED ORDER — SODIUM CHLORIDE 0.9 % IV SOLN
Freq: Once | INTRAVENOUS | Status: AC
  Administered 2013-12-13: 16:00:00 via INTRAVENOUS

## 2013-12-13 MED ORDER — SODIUM CHLORIDE 0.9 % IJ SOLN
10.0000 mL | INTRAMUSCULAR | Status: DC | PRN
  Administered 2013-12-13: 10 mL
  Filled 2013-12-13: qty 10

## 2013-12-13 MED ORDER — HEPARIN SOD (PORK) LOCK FLUSH 100 UNIT/ML IV SOLN
500.0000 [IU] | Freq: Once | INTRAVENOUS | Status: AC | PRN
  Administered 2013-12-13: 500 [IU]
  Filled 2013-12-13: qty 5

## 2013-12-13 MED ORDER — DEXTROSE 5 % IV SOLN
27.0000 mg/m2 | Freq: Once | INTRAVENOUS | Status: AC
  Administered 2013-12-13: 60 mg via INTRAVENOUS
  Filled 2013-12-13: qty 30

## 2013-12-13 NOTE — Telephone Encounter (Signed)
s.w. pt and advised on OCT appt....pt ok adn awill get new sched today

## 2013-12-13 NOTE — Patient Instructions (Signed)
[facility] Discharge Instructions for Patients Receiving Chemotherapy  Today you received the following chemotherapy agents Kyprolis.  To help prevent nausea and vomiting after your treatment, we encourage you to take your nausea medication.   If you develop nausea and vomiting that is not controlled by your nausea medication, call the clinic.   BELOW ARE SYMPTOMS THAT SHOULD BE REPORTED IMMEDIATELY:  *FEVER GREATER THAN 100.5 F  *CHILLS WITH OR WITHOUT FEVER  NAUSEA AND VOMITING THAT IS NOT CONTROLLED WITH YOUR NAUSEA MEDICATION  *UNUSUAL SHORTNESS OF BREATH  *UNUSUAL BRUISING OR BLEEDING  TENDERNESS IN MOUTH AND THROAT WITH OR WITHOUT PRESENCE OF ULCERS  *URINARY PROBLEMS  *BOWEL PROBLEMS  UNUSUAL RASH Items with * indicate a potential emergency and should be followed up as soon as possible.  Feel free to call the clinic you have any questions or concerns. The clinic phone number is [phone].    

## 2013-12-16 LAB — PROTEIN ELECTROPHORESIS, SERUM
Albumin ELP: 49.4 % — ABNORMAL LOW (ref 55.8–66.1)
Alpha-1-Globulin: 3.9 % (ref 2.9–4.9)
Alpha-2-Globulin: 8.1 % (ref 7.1–11.8)
Beta 2: 29.7 % — ABNORMAL HIGH (ref 3.2–6.5)
Beta Globulin: 6.3 % (ref 4.7–7.2)
Gamma Globulin: 2.6 % — ABNORMAL LOW (ref 11.1–18.8)
M-SPIKE, %: 1.72 g/dL
TOTAL PROTEIN, SERUM ELECTROPHOR: 7.7 g/dL (ref 6.0–8.3)

## 2013-12-16 LAB — IGG: IGG (IMMUNOGLOBIN G), SERUM: 1850 mg/dL — AB (ref 650–1600)

## 2013-12-16 LAB — KAPPA/LAMBDA LIGHT CHAINS
KAPPA FREE LGHT CHN: 0.03 mg/dL — AB (ref 0.33–1.94)
KAPPA LAMBDA RATIO: 0 — AB (ref 0.26–1.65)
LAMBDA FREE LGHT CHN: 21.6 mg/dL — AB (ref 0.57–2.63)

## 2013-12-19 ENCOUNTER — Other Ambulatory Visit (HOSPITAL_BASED_OUTPATIENT_CLINIC_OR_DEPARTMENT_OTHER): Payer: BC Managed Care – PPO

## 2013-12-19 ENCOUNTER — Ambulatory Visit (HOSPITAL_BASED_OUTPATIENT_CLINIC_OR_DEPARTMENT_OTHER): Payer: BC Managed Care – PPO

## 2013-12-19 VITALS — BP 134/77 | HR 96 | Temp 98.1°F | Resp 18

## 2013-12-19 DIAGNOSIS — Z95828 Presence of other vascular implants and grafts: Secondary | ICD-10-CM

## 2013-12-19 DIAGNOSIS — C9000 Multiple myeloma not having achieved remission: Secondary | ICD-10-CM

## 2013-12-19 DIAGNOSIS — Z5112 Encounter for antineoplastic immunotherapy: Secondary | ICD-10-CM

## 2013-12-19 LAB — CBC WITH DIFFERENTIAL/PLATELET
BASO%: 0.2 % (ref 0.0–2.0)
Basophils Absolute: 0 10*3/uL (ref 0.0–0.1)
EOS ABS: 0 10*3/uL (ref 0.0–0.5)
EOS%: 0.2 % (ref 0.0–7.0)
HEMATOCRIT: 36 % — AB (ref 38.4–49.9)
HGB: 12.1 g/dL — ABNORMAL LOW (ref 13.0–17.1)
LYMPH%: 12.6 % — ABNORMAL LOW (ref 14.0–49.0)
MCH: 29.5 pg (ref 27.2–33.4)
MCHC: 33.6 g/dL (ref 32.0–36.0)
MCV: 87.8 fL (ref 79.3–98.0)
MONO#: 0.1 10*3/uL (ref 0.1–0.9)
MONO%: 2.1 % (ref 0.0–14.0)
NEUT%: 84.9 % — AB (ref 39.0–75.0)
NEUTROS ABS: 4.9 10*3/uL (ref 1.5–6.5)
Platelets: 203 10*3/uL (ref 140–400)
RBC: 4.1 10*6/uL — AB (ref 4.20–5.82)
RDW: 14.7 % — ABNORMAL HIGH (ref 11.0–14.6)
WBC: 5.8 10*3/uL (ref 4.0–10.3)
lymph#: 0.7 10*3/uL — ABNORMAL LOW (ref 0.9–3.3)

## 2013-12-19 MED ORDER — SODIUM CHLORIDE 0.9 % IJ SOLN
10.0000 mL | INTRAMUSCULAR | Status: DC | PRN
  Administered 2013-12-19: 10 mL
  Filled 2013-12-19: qty 10

## 2013-12-19 MED ORDER — ONDANSETRON 8 MG/50ML IVPB (CHCC)
8.0000 mg | Freq: Once | INTRAVENOUS | Status: AC
  Administered 2013-12-19: 8 mg via INTRAVENOUS

## 2013-12-19 MED ORDER — ONDANSETRON 8 MG/NS 50 ML IVPB
INTRAVENOUS | Status: AC
  Filled 2013-12-19: qty 8

## 2013-12-19 MED ORDER — HEPARIN SOD (PORK) LOCK FLUSH 100 UNIT/ML IV SOLN
500.0000 [IU] | Freq: Once | INTRAVENOUS | Status: AC | PRN
  Administered 2013-12-19: 500 [IU]
  Filled 2013-12-19: qty 5

## 2013-12-19 MED ORDER — ONDANSETRON HCL 8 MG PO TABS
ORAL_TABLET | ORAL | Status: AC
  Filled 2013-12-19: qty 1

## 2013-12-19 MED ORDER — SODIUM CHLORIDE 0.9 % IJ SOLN
10.0000 mL | INTRAMUSCULAR | Status: DC | PRN
  Administered 2013-12-19: 10 mL via INTRAVENOUS
  Filled 2013-12-19: qty 10

## 2013-12-19 MED ORDER — SODIUM CHLORIDE 0.9 % IV SOLN
Freq: Once | INTRAVENOUS | Status: AC
  Administered 2013-12-19: 15:00:00 via INTRAVENOUS

## 2013-12-19 MED ORDER — DEXTROSE 5 % IV SOLN
27.0000 mg/m2 | Freq: Once | INTRAVENOUS | Status: AC
  Administered 2013-12-19: 60 mg via INTRAVENOUS
  Filled 2013-12-19: qty 30

## 2013-12-19 NOTE — Patient Instructions (Signed)

## 2013-12-19 NOTE — Patient Instructions (Signed)
Carfilzomib injection What is this medicine? CARFILZOMIB (kar FILZ oh mib) is a chemotherapy drug that works by slowing or stopping cancer cell growth. This medicine is used to treat multiple myeloma. This medicine may be used for other purposes; ask your health care provider or pharmacist if you have questions. COMMON BRAND NAME(S): KYPROLIS What should I tell my health care provider before I take this medicine? They need to know if you have any of these conditions: -heart disease -irregular heartbeat -liver disease -lung or breathing disease -an unusual or allergic reaction to carfilzomib, or other medicines, foods, dyes, or preservatives -pregnant or trying to get pregnant -breast-feeding How should I use this medicine? This medicine is for injection or infusion into a vein. It is given by a health care professional in a hospital or clinic setting. Talk to your pediatrician regarding the use of this medicine in children. Special care may be needed. Overdosage: If you think you've taken too much of this medicine contact a poison control center or emergency room at once. Overdosage: If you think you have taken too much of this medicine contact a poison control center or emergency room at once. NOTE: This medicine is only for you. Do not share this medicine with others. What if I miss a dose? It is important not to miss your dose. Call your doctor or health care professional if you are unable to keep an appointment. What may interact with this medicine? Interactions are not expected. Give your health care provider a list of all the medicines, herbs, non-prescription drugs, or dietary supplements you use. Also tell them if you smoke, drink alcohol, or use illegal drugs. Some items may interact with your medicine. This list may not describe all possible interactions. Give your health care provider a list of all the medicines, herbs, non-prescription drugs, or dietary supplements you use. Also  tell them if you smoke, drink alcohol, or use illegal drugs. Some items may interact with your medicine. What should I watch for while using this medicine? Your condition will be monitored carefully while you are receiving this medicine. Report any side effects. Continue your course of treatment even though you feel ill unless your doctor tells you to stop. Call your doctor or health care professional for advice if you get a fever, chills or sore throat, or other symptoms of a cold or flu. Do not treat yourself. Try to avoid being around people who are sick. Do not become pregnant while taking this medicine. Women should inform their doctor if they wish to become pregnant or think they might be pregnant. There is a potential for serious side effects to an unborn child. Talk to your health care professional or pharmacist for more information. Do not breast-feed an infant while taking this medicine. Check with your doctor or health care professional if you get an attack of severe diarrhea, nausea and vomiting, or if you sweat a lot. The loss of too much body fluid can make it dangerous for you to take this medicine. You may get dizzy. Do not drive, use machinery, or do anything that needs mental alertness until you know how this medicine affects you. Do not stand or sit up quickly, especially if you are an older patient. This reduces the risk of dizzy or fainting spells. What side effects may I notice from receiving this medicine? Side effects that you should report to your doctor or health care professional as soon as possible: -allergic reactions like skin rash, itching or hives,  swelling of the face, lips, or tongue -breathing problems -chest pain or palpitationschest tightness -cough -dark urine -dizziness -feeling faint or lightheaded -fever or chills -general ill feeling or flu-like symptoms -light-colored stools -palpitations -right upper belly pain -swelling of the legs or ankles -unusual  bleeding or bruising -unusually weak or tired -yellowing of the eyes or skin Side effects that usually do not require medical attention (Report these to your doctor or health care professional if they continue or are bothersome.): -diarrhea -headache -nausea, vomiting -tiredness This list may not describe all possible side effects. Call your doctor for medical advice about side effects. You may report side effects to FDA at 1-800-FDA-1088. Where should I keep my medicine? This drug is given in a hospital or clinic and will not be stored at home. NOTE: This sheet is a summary. It may not cover all possible information. If you have questions about this medicine, talk to your doctor, pharmacist, or health care provider.  2015, [employer]. (2011-08-19 17:02:29)

## 2013-12-20 ENCOUNTER — Ambulatory Visit (HOSPITAL_BASED_OUTPATIENT_CLINIC_OR_DEPARTMENT_OTHER): Payer: BC Managed Care – PPO

## 2013-12-20 VITALS — BP 125/68 | HR 58 | Temp 97.7°F

## 2013-12-20 DIAGNOSIS — C9000 Multiple myeloma not having achieved remission: Secondary | ICD-10-CM

## 2013-12-20 DIAGNOSIS — Z5112 Encounter for antineoplastic immunotherapy: Secondary | ICD-10-CM

## 2013-12-20 MED ORDER — ONDANSETRON 8 MG/50ML IVPB (CHCC)
8.0000 mg | Freq: Once | INTRAVENOUS | Status: AC
  Administered 2013-12-20: 8 mg via INTRAVENOUS

## 2013-12-20 MED ORDER — ONDANSETRON 8 MG/NS 50 ML IVPB
INTRAVENOUS | Status: AC
  Filled 2013-12-20: qty 8

## 2013-12-20 MED ORDER — SODIUM CHLORIDE 0.9 % IJ SOLN
10.0000 mL | INTRAMUSCULAR | Status: DC | PRN
  Administered 2013-12-20: 10 mL
  Filled 2013-12-20: qty 10

## 2013-12-20 MED ORDER — DEXTROSE 5 % IV SOLN
27.0000 mg/m2 | Freq: Once | INTRAVENOUS | Status: AC
  Administered 2013-12-20: 60 mg via INTRAVENOUS
  Filled 2013-12-20: qty 30

## 2013-12-20 MED ORDER — HEPARIN SOD (PORK) LOCK FLUSH 100 UNIT/ML IV SOLN
500.0000 [IU] | Freq: Once | INTRAVENOUS | Status: AC | PRN
  Administered 2013-12-20: 500 [IU]
  Filled 2013-12-20: qty 5

## 2013-12-20 MED ORDER — SODIUM CHLORIDE 0.9 % IV SOLN
Freq: Once | INTRAVENOUS | Status: AC
  Administered 2013-12-20: 16:00:00 via INTRAVENOUS

## 2013-12-20 NOTE — Patient Instructions (Signed)
[facility] Discharge Instructions for Patients Receiving Chemotherapy  Today you received the following chemotherapy agents Kyprolis To help prevent nausea and vomiting after your treatment, we encourage you to take your nausea medication as prescribed.  If you develop nausea and vomiting that is not controlled by your nausea medication, call the clinic.   BELOW ARE SYMPTOMS THAT SHOULD BE REPORTED IMMEDIATELY:  *FEVER GREATER THAN 100.5 F  *CHILLS WITH OR WITHOUT FEVER  NAUSEA AND VOMITING THAT IS NOT CONTROLLED WITH YOUR NAUSEA MEDICATION  *UNUSUAL SHORTNESS OF BREATH  *UNUSUAL BRUISING OR BLEEDING  TENDERNESS IN MOUTH AND THROAT WITH OR WITHOUT PRESENCE OF ULCERS  *URINARY PROBLEMS  *BOWEL PROBLEMS  UNUSUAL RASH Items with * indicate a potential emergency and should be followed up as soon as possible.  Feel free to call the clinic you have any questions or concerns. The clinic phone number is [phone].    

## 2013-12-23 MED ORDER — DEXAMETHASONE SODIUM PHOSPHATE 10 MG/ML IJ SOLN
INTRAMUSCULAR | Status: AC
  Filled 2013-12-23: qty 1

## 2013-12-23 MED ORDER — ONDANSETRON 8 MG/NS 50 ML IVPB
INTRAVENOUS | Status: AC
  Filled 2013-12-23: qty 8

## 2013-12-24 ENCOUNTER — Encounter: Payer: Self-pay | Admitting: Oncology

## 2013-12-24 NOTE — Progress Notes (Signed)
Insurance is paying Kyprolis.

## 2013-12-26 ENCOUNTER — Ambulatory Visit (HOSPITAL_BASED_OUTPATIENT_CLINIC_OR_DEPARTMENT_OTHER): Payer: BC Managed Care – PPO

## 2013-12-26 ENCOUNTER — Other Ambulatory Visit (HOSPITAL_BASED_OUTPATIENT_CLINIC_OR_DEPARTMENT_OTHER): Payer: BC Managed Care – PPO

## 2013-12-26 ENCOUNTER — Ambulatory Visit: Payer: BC Managed Care – PPO

## 2013-12-26 ENCOUNTER — Other Ambulatory Visit: Payer: Self-pay | Admitting: *Deleted

## 2013-12-26 VITALS — BP 154/83 | HR 98 | Temp 98.4°F | Resp 18

## 2013-12-26 DIAGNOSIS — C9000 Multiple myeloma not having achieved remission: Secondary | ICD-10-CM

## 2013-12-26 DIAGNOSIS — Z95828 Presence of other vascular implants and grafts: Secondary | ICD-10-CM

## 2013-12-26 DIAGNOSIS — N08 Glomerular disorders in diseases classified elsewhere: Principal | ICD-10-CM

## 2013-12-26 DIAGNOSIS — Z5112 Encounter for antineoplastic immunotherapy: Secondary | ICD-10-CM

## 2013-12-26 LAB — CBC WITH DIFFERENTIAL/PLATELET
BASO%: 0.3 % (ref 0.0–2.0)
Basophils Absolute: 0 10*3/uL (ref 0.0–0.1)
EOS%: 0.1 % (ref 0.0–7.0)
Eosinophils Absolute: 0 10*3/uL (ref 0.0–0.5)
HEMATOCRIT: 35.3 % — AB (ref 38.4–49.9)
HGB: 12 g/dL — ABNORMAL LOW (ref 13.0–17.1)
LYMPH%: 10 % — AB (ref 14.0–49.0)
MCH: 29.5 pg (ref 27.2–33.4)
MCHC: 34 g/dL (ref 32.0–36.0)
MCV: 86.7 fL (ref 79.3–98.0)
MONO#: 0.1 10*3/uL (ref 0.1–0.9)
MONO%: 0.9 % (ref 0.0–14.0)
NEUT#: 6.8 10*3/uL — ABNORMAL HIGH (ref 1.5–6.5)
NEUT%: 88.7 % — AB (ref 39.0–75.0)
PLATELETS: 274 10*3/uL (ref 140–400)
RBC: 4.07 10*6/uL — ABNORMAL LOW (ref 4.20–5.82)
RDW: 14.8 % — ABNORMAL HIGH (ref 11.0–14.6)
WBC: 7.7 10*3/uL (ref 4.0–10.3)
lymph#: 0.8 10*3/uL — ABNORMAL LOW (ref 0.9–3.3)

## 2013-12-26 MED ORDER — FENTANYL 25 MCG/HR TD PT72: 25.0000 ug | MEDICATED_PATCH | TRANSDERMAL | Status: DC

## 2013-12-26 MED ORDER — SODIUM CHLORIDE 0.9 % IJ SOLN
10.0000 mL | INTRAMUSCULAR | Status: DC | PRN
  Administered 2013-12-26: 10 mL
  Filled 2013-12-26: qty 10

## 2013-12-26 MED ORDER — ONDANSETRON 8 MG/50ML IVPB (CHCC)
8.0000 mg | Freq: Once | INTRAVENOUS | Status: AC
  Administered 2013-12-26: 8 mg via INTRAVENOUS

## 2013-12-26 MED ORDER — ONDANSETRON 8 MG/NS 50 ML IVPB
INTRAVENOUS | Status: AC
  Filled 2013-12-26: qty 8

## 2013-12-26 MED ORDER — HEPARIN SOD (PORK) LOCK FLUSH 100 UNIT/ML IV SOLN
500.0000 [IU] | Freq: Once | INTRAVENOUS | Status: AC | PRN
  Administered 2013-12-26: 500 [IU]
  Filled 2013-12-26: qty 5

## 2013-12-26 MED ORDER — DEXTROSE 5 % IV SOLN
27.0000 mg/m2 | Freq: Once | INTRAVENOUS | Status: AC
  Administered 2013-12-26: 60 mg via INTRAVENOUS
  Filled 2013-12-26: qty 30

## 2013-12-26 MED ORDER — SODIUM CHLORIDE 0.9 % IJ SOLN
10.0000 mL | INTRAMUSCULAR | Status: DC | PRN
  Administered 2013-12-26: 10 mL via INTRAVENOUS
  Filled 2013-12-26: qty 10

## 2013-12-26 MED ORDER — SODIUM CHLORIDE 0.9 % IV SOLN
Freq: Once | INTRAVENOUS | Status: AC
  Administered 2013-12-26: 16:00:00 via INTRAVENOUS

## 2013-12-26 NOTE — Progress Notes (Signed)
Patient requesting fent patch refill; he has 3 remaining patches so not urgent request. Manuela Schwartz, RN to Dr. Benay Spice to follow up in infusion room. Today is first day of 2 consecutive treatment days.

## 2013-12-26 NOTE — Progress Notes (Signed)
Verbal order obtained from Dr. Benay Spice to proceed with treatment today using cmet results from 12/12/2013.  Both MD and Pharmacist Lattie Haw aware of creatinine level of 1.3 on 12/12/2013.   Primary RN Cira Rue notified to proceed with above order. HL

## 2013-12-26 NOTE — Telephone Encounter (Signed)
In office for treatment today and requesting Duragesic refill.

## 2013-12-26 NOTE — Patient Instructions (Signed)
[facility] Discharge Instructions for Patients Receiving Chemotherapy  Today you received the following chemotherapy agent: Kyprolis  To help prevent nausea and vomiting after your treatment, we encourage you to take your nausea medication as prescribed.    If you develop nausea and vomiting that is not controlled by your nausea medication, call the clinic.   BELOW ARE SYMPTOMS THAT SHOULD BE REPORTED IMMEDIATELY:  *FEVER GREATER THAN 100.5 F  *CHILLS WITH OR WITHOUT FEVER  NAUSEA AND VOMITING THAT IS NOT CONTROLLED WITH YOUR NAUSEA MEDICATION  *UNUSUAL SHORTNESS OF BREATH  *UNUSUAL BRUISING OR BLEEDING  TENDERNESS IN MOUTH AND THROAT WITH OR WITHOUT PRESENCE OF ULCERS  *URINARY PROBLEMS  *BOWEL PROBLEMS  UNUSUAL RASH Items with * indicate a potential emergency and should be followed up as soon as possible.  Feel free to call the clinic you have any questions or concerns. The clinic phone number is [phone].    

## 2013-12-26 NOTE — Patient Instructions (Signed)

## 2013-12-27 ENCOUNTER — Other Ambulatory Visit: Payer: Self-pay | Admitting: Nurse Practitioner

## 2013-12-27 ENCOUNTER — Other Ambulatory Visit: Payer: Self-pay | Admitting: *Deleted

## 2013-12-27 ENCOUNTER — Ambulatory Visit (HOSPITAL_BASED_OUTPATIENT_CLINIC_OR_DEPARTMENT_OTHER): Payer: BC Managed Care – PPO

## 2013-12-27 VITALS — BP 147/71 | HR 66 | Temp 97.7°F

## 2013-12-27 DIAGNOSIS — Z5112 Encounter for antineoplastic immunotherapy: Secondary | ICD-10-CM

## 2013-12-27 DIAGNOSIS — C9000 Multiple myeloma not having achieved remission: Secondary | ICD-10-CM

## 2013-12-27 DIAGNOSIS — N08 Glomerular disorders in diseases classified elsewhere: Principal | ICD-10-CM

## 2013-12-27 MED ORDER — ONDANSETRON 8 MG/NS 50 ML IVPB
INTRAVENOUS | Status: AC
  Filled 2013-12-27: qty 8

## 2013-12-27 MED ORDER — HYDROCODONE-ACETAMINOPHEN 10-325 MG PO TABS: 1.0000 | ORAL_TABLET | Freq: Four times a day (QID) | ORAL | Status: DC | PRN

## 2013-12-27 MED ORDER — FENTANYL 25 MCG/HR TD PT72: 25.0000 ug | MEDICATED_PATCH | TRANSDERMAL | Status: DC

## 2013-12-27 MED ORDER — SODIUM CHLORIDE 0.9 % IV SOLN
Freq: Once | INTRAVENOUS | Status: AC
  Administered 2013-12-27: 16:00:00 via INTRAVENOUS

## 2013-12-27 MED ORDER — HEPARIN SOD (PORK) LOCK FLUSH 100 UNIT/ML IV SOLN
500.0000 [IU] | Freq: Once | INTRAVENOUS | Status: AC | PRN
  Administered 2013-12-27: 500 [IU]
  Filled 2013-12-27: qty 5

## 2013-12-27 MED ORDER — DEXTROSE 5 % IV SOLN
27.0000 mg/m2 | Freq: Once | INTRAVENOUS | Status: AC
  Administered 2013-12-27: 60 mg via INTRAVENOUS
  Filled 2013-12-27: qty 30

## 2013-12-27 MED ORDER — ONDANSETRON 8 MG/50ML IVPB (CHCC)
8.0000 mg | Freq: Once | INTRAVENOUS | Status: AC
  Administered 2013-12-27: 8 mg via INTRAVENOUS

## 2013-12-27 MED ORDER — SODIUM CHLORIDE 0.9 % IJ SOLN
10.0000 mL | INTRAMUSCULAR | Status: DC | PRN
  Administered 2013-12-27: 10 mL
  Filled 2013-12-27: qty 10

## 2013-12-27 NOTE — Telephone Encounter (Signed)
Reprinted script-MD signature failed/

## 2013-12-27 NOTE — Patient Instructions (Signed)
[facility] Discharge Instructions for Patients Receiving Chemotherapy  Today you received the following chemotherapy agents Kyprolis To help prevent nausea and vomiting after your treatment, we encourage you to take your nausea medication as prescribed.  If you develop nausea and vomiting that is not controlled by your nausea medication, call the clinic.   BELOW ARE SYMPTOMS THAT SHOULD BE REPORTED IMMEDIATELY:  *FEVER GREATER THAN 100.5 F  *CHILLS WITH OR WITHOUT FEVER  NAUSEA AND VOMITING THAT IS NOT CONTROLLED WITH YOUR NAUSEA MEDICATION  *UNUSUAL SHORTNESS OF BREATH  *UNUSUAL BRUISING OR BLEEDING  TENDERNESS IN MOUTH AND THROAT WITH OR WITHOUT PRESENCE OF ULCERS  *URINARY PROBLEMS  *BOWEL PROBLEMS  UNUSUAL RASH Items with * indicate a potential emergency and should be followed up as soon as possible.  Feel free to call the clinic you have any questions or concerns. The clinic phone number is [phone].    

## 2013-12-30 ENCOUNTER — Other Ambulatory Visit: Payer: Self-pay | Admitting: *Deleted

## 2013-12-30 DIAGNOSIS — C9000 Multiple myeloma not having achieved remission: Secondary | ICD-10-CM

## 2013-12-30 MED ORDER — CARVEDILOL 6.25 MG PO TABS: ORAL_TABLET | ORAL | Status: DC

## 2013-12-30 MED ORDER — FUROSEMIDE 40 MG PO TABS: ORAL_TABLET | ORAL | Status: DC

## 2014-01-02 ENCOUNTER — Other Ambulatory Visit (HOSPITAL_BASED_OUTPATIENT_CLINIC_OR_DEPARTMENT_OTHER): Payer: BC Managed Care – PPO

## 2014-01-02 ENCOUNTER — Telehealth: Payer: Self-pay | Admitting: Cardiovascular Disease

## 2014-01-02 DIAGNOSIS — I10 Essential (primary) hypertension: Secondary | ICD-10-CM

## 2014-01-02 DIAGNOSIS — C9000 Multiple myeloma not having achieved remission: Secondary | ICD-10-CM

## 2014-01-02 MED ORDER — POTASSIUM CHLORIDE ER 10 MEQ PO TBCR: 10.0000 meq | EXTENDED_RELEASE_TABLET | Freq: Every day | ORAL | Status: DC

## 2014-01-02 MED ORDER — VALSARTAN 160 MG PO TABS: 160.0000 mg | ORAL_TABLET | Freq: Every day | ORAL | Status: DC

## 2014-01-02 NOTE — Telephone Encounter (Signed)
New Message  Pt called reports for the past month his BP has been very high this mornmig 192/117 and as of  4:00 pm today Bp was 172/120.Marland Kitchen Pt requests a call back to discuss medications

## 2014-01-02 NOTE — Telephone Encounter (Signed)
Spoke with patient who states his blood pressure has been elevated this week; states advised to call our office by oncologist.  Patient reports he is taking medication as directed.  States BP today after sitting for a few hours today was high was still 172/120.  States systolic pressure this week has been 161-096 and diastolic has been 045-409.  Patient takes 20 mg Decadron every Thursday prior to chemotherapy.  I advised patient this could have caused blood pressure to be higher today.  I reviewed patient's complaint with Dr. Acie Fredrickson who is in the office.  He advised for patient to increase Diovan to 160 mg once daily and added KDur 10 meq daily as patient has been advised by Dr. Benay Spice to take Lasix 40 mg daily.  Rx sent to patient's pharmacy and I advised patient to check BP and pulse twice daily and record and to call me Monday to report.  I advised patient to call 911 or go directly to the hospital if he gets a severe headache or has any signs/symptoms of a stroke.  Patient verbalized understanding and agreement.

## 2014-01-03 LAB — CBC WITH DIFFERENTIAL/PLATELET
BASO%: 0.3 % (ref 0.0–2.0)
BASOS ABS: 0 10*3/uL (ref 0.0–0.1)
EOS%: 0.1 % (ref 0.0–7.0)
Eosinophils Absolute: 0 10*3/uL (ref 0.0–0.5)
HCT: 37.6 % — ABNORMAL LOW (ref 38.4–49.9)
HEMOGLOBIN: 12.7 g/dL — AB (ref 13.0–17.1)
LYMPH#: 0.6 10*3/uL — AB (ref 0.9–3.3)
LYMPH%: 9.1 % — ABNORMAL LOW (ref 14.0–49.0)
MCH: 29.6 pg (ref 27.2–33.4)
MCHC: 33.8 g/dL (ref 32.0–36.0)
MCV: 87.6 fL (ref 79.3–98.0)
MONO#: 0.1 10*3/uL (ref 0.1–0.9)
MONO%: 1 % (ref 0.0–14.0)
NEUT#: 6.2 10*3/uL (ref 1.5–6.5)
NEUT%: 89.5 % — AB (ref 39.0–75.0)
Platelets: 250 10*3/uL (ref 140–400)
RBC: 4.29 10*6/uL (ref 4.20–5.82)
RDW: 15.2 % — AB (ref 11.0–14.6)
WBC: 7 10*3/uL (ref 4.0–10.3)
nRBC: 0 % (ref 0–0)

## 2014-01-05 ENCOUNTER — Other Ambulatory Visit: Payer: Self-pay | Admitting: Oncology

## 2014-01-06 NOTE — Addendum Note (Signed)
Addended by: Emmaline Life on: 01/06/2014 03:32 PM   Modules accepted: Orders

## 2014-01-06 NOTE — Telephone Encounter (Addendum)
Spoke with patient who reports vital signs:  Saturday AM 136/65, HR 64 Saturday PM @ 11:00 140/68, HR 59 Sunday AM 157/79, HR 60 Monday AM 147/81, HR 62  Patient states he has not had a headache x 2 days; states he is feeling well overall.  Pt c/o occasional burning across his chest.  Patient states he had recent myoview for same complaint and myoview was normal.  Patient questions whether elevated blood pressure could be causing this burning sensation.  I advised patient to note whether or not it occurs when BP is elevated; advised it could also be musculoskeletal.  I advised patient that we will need blood work to evaluate kidney function due to increased dosage of medicine.  Patient scheduled for 12/1 lab.  I advised patient to call back if BP systolic remains > 017 and diastolic remains greater than 90 or if patient develops lower than normal blood pressure, headache, or any other symptoms that concern him.  Patient verbalized understanding and agreement with plan of care.

## 2014-01-08 ENCOUNTER — Ambulatory Visit (HOSPITAL_BASED_OUTPATIENT_CLINIC_OR_DEPARTMENT_OTHER): Payer: BC Managed Care – PPO | Admitting: Oncology

## 2014-01-08 ENCOUNTER — Telehealth: Payer: Self-pay | Admitting: Oncology

## 2014-01-08 ENCOUNTER — Other Ambulatory Visit: Payer: Self-pay | Admitting: *Deleted

## 2014-01-08 VITALS — BP 112/58 | HR 90 | Temp 97.8°F | Resp 18 | Ht 72.0 in | Wt 222.9 lb

## 2014-01-08 DIAGNOSIS — G622 Polyneuropathy due to other toxic agents: Secondary | ICD-10-CM

## 2014-01-08 DIAGNOSIS — C9000 Multiple myeloma not having achieved remission: Secondary | ICD-10-CM

## 2014-01-08 MED ORDER — PNEUMOCOCCAL 13-VAL CONJ VACC IM SUSP
0.5000 mL | Freq: Once | INTRAMUSCULAR | Status: DC
  Filled 2014-01-08: qty 0.5

## 2014-01-08 NOTE — Progress Notes (Signed)
[place] OFFICE PROGRESS NOTE   Diagnosis: Multiple myeloma  INTERVAL HISTORY:   [name] turns as scheduled. He completed another cycle of Carfilzomib 12/27/2013. He reports increased neuropathy symptoms in the right foot. He continues a Duragesic patch. He developed headaches and an elevated blood pressure last week. He contacted cardiology and the valsartan dose was increased. He reports the blood pressure has improved. No fever.  Objective:  Vital signs in last 24 hours:  Blood pressure 112/58, pulse 90, temperature 97.8 F (36.6 C), temperature source Oral, resp. rate 18, height 6' (1.829 m), weight 222 lb 14.4 oz (101.107 kg).    HEENT: No thrush or ulcer Resp: Lungs clear bilaterally Cardio: Regular rate and rhythm, distant heart sounds GI: No hepatosplenomegaly, nontender Vascular: No leg edema Musculoskeletal: No tenderness at the anterior chest wall    Portacath/PICC-without erythema  Lab Results:  Lab Results  Component Value Date   WBC 7.0 01/02/2014   HGB 12.7* 01/02/2014   HCT 37.6* 01/02/2014   MCV 87.6 01/02/2014   PLT 250 01/02/2014   NEUTROABS 6.2 01/02/2014    12/12/2013: Serum M spike 1.72, IgG 1850, lambda free light chains 21.6 Medications: I have reviewed the patient's current medications.  Assessment/Plan: 1.Multiple myeloma, IgG lambda monoclonal protein  -Initial diagnosis 2008, bone marrow with a 40-50% plasma cells  -Lenalidomide 25 mg,day 1-21 and Decadron 40 mg weekly and 3710, complicated by peripheral neuropathy  -Therapy change to bortezomib IV twice weekly plus Decadron  -High-dose chemotherapy with autologous stem cell transplantation 2009 at [facility]  -Bortezomib, cyclophosphamide, and Decadron December 2012 through February 2014 (discontinued secondary to pulmonary aspergillosis)  -January 2014 M spike rise to 1.1 g/dL from 0.1 g/dL in December 2013. Treatment started with Carlfilzomib, lenalidomide, and Decadron.    -treatment was restarted with Carlfilzomib, lenalidomide, and Decadron 08/16/2012.  -Restaging labs on 10/15/2012 consistent with improvement in the serum M. Protein.  -Continuation of Carfilzomib/Revlimid/dexamethasone.  -Continued improvement in the serum M spike and IgG level on 11/29/2012.  -Improvement in the serum M spike, slight increase in IgG on 01/03/2013.  -Improvement in the serum M spike, stable IgG, stable lambda free light chains 01/31/2013  -The IgG and serum M spike were slightly higher 02/28/2013, stable lambda light chains  -IgG, M spike, and lambda light chains slightly lower on 04/04/2013.  -Continuation of Carfilzomib/Revlimid/dexamethasone.  -IgG, M spike and lambda light chains stable 05/02/2013.  -IgG, serum M spike, and lambda free light chains stable on 07/11/2013; 08/08/2013.  -IgG, serum M spike, and lambda light chains prior 10/17/2013 after being off treatment during July 2015  -Carfilomib/decadron continue with a cycle starting 10/17/2013  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 He has been unable to lower the Duragesic patch dose  4. Irritable bowel syndrome  5. Hypertension  6. "Chest pain "and dyspnea with exertion ,evaluated by cardiology, . Normal stress nuclear study 11/07/2012 and 08/07/2013.  7. history of Diarrhea. Most likely related to Revlimid  8. febrile illness 12/05/2013 with associated diffuse bone pain-no source for infection identified    Disposition:  He appears stable. The plan is to continue the current regimen of Carfilzomib/Decadron unless there is clinical or laboratory evidence of disease progression. We will reintroduce it Revlimid if he progresses. We will followup on the myeloma panel from today.  He will begin another cycle of treatment 01/09/2014. He would like to be off the week of Thanksgiving. The next cycle will begin  02/13/2014 and he will return for an office  visit that day.  Betsy Coder, MD  01/08/2014  11:32 AM

## 2014-01-08 NOTE — Telephone Encounter (Signed)
lvm for pt regarding to extended appts....advised pt to pick up new sched on tomorrow

## 2014-01-09 ENCOUNTER — Ambulatory Visit (HOSPITAL_BASED_OUTPATIENT_CLINIC_OR_DEPARTMENT_OTHER): Payer: BC Managed Care – PPO

## 2014-01-09 ENCOUNTER — Other Ambulatory Visit: Payer: Self-pay | Admitting: *Deleted

## 2014-01-09 ENCOUNTER — Other Ambulatory Visit (HOSPITAL_BASED_OUTPATIENT_CLINIC_OR_DEPARTMENT_OTHER): Payer: BC Managed Care – PPO

## 2014-01-09 DIAGNOSIS — C9000 Multiple myeloma not having achieved remission: Secondary | ICD-10-CM

## 2014-01-09 DIAGNOSIS — Z5112 Encounter for antineoplastic immunotherapy: Secondary | ICD-10-CM

## 2014-01-09 LAB — CBC WITH DIFFERENTIAL/PLATELET
BASO%: 0.8 % (ref 0.0–2.0)
Basophils Absolute: 0.1 10*3/uL (ref 0.0–0.1)
EOS%: 0.3 % (ref 0.0–7.0)
Eosinophils Absolute: 0 10*3/uL (ref 0.0–0.5)
HCT: 37.9 % — ABNORMAL LOW (ref 38.4–49.9)
HEMOGLOBIN: 12.3 g/dL — AB (ref 13.0–17.1)
LYMPH%: 10.5 % — ABNORMAL LOW (ref 14.0–49.0)
MCH: 29 pg (ref 27.2–33.4)
MCHC: 32.5 g/dL (ref 32.0–36.0)
MCV: 89.1 fL (ref 79.3–98.0)
MONO#: 0.1 10*3/uL (ref 0.1–0.9)
MONO%: 1.3 % (ref 0.0–14.0)
NEUT#: 6.9 10*3/uL — ABNORMAL HIGH (ref 1.5–6.5)
NEUT%: 87.1 % — ABNORMAL HIGH (ref 39.0–75.0)
Platelets: 265 10*3/uL (ref 140–400)
RBC: 4.25 10*6/uL (ref 4.20–5.82)
RDW: 16 % — AB (ref 11.0–14.6)
WBC: 8 10*3/uL (ref 4.0–10.3)
lymph#: 0.8 10*3/uL — ABNORMAL LOW (ref 0.9–3.3)

## 2014-01-09 LAB — COMPREHENSIVE METABOLIC PANEL (CC13)
ALT: 42 U/L (ref 0–55)
AST: 20 U/L (ref 5–34)
Albumin: 4 g/dL (ref 3.5–5.0)
Alkaline Phosphatase: 53 U/L (ref 40–150)
Anion Gap: 12 mEq/L — ABNORMAL HIGH (ref 3–11)
BUN: 18.3 mg/dL (ref 7.0–26.0)
CALCIUM: 10.1 mg/dL (ref 8.4–10.4)
CO2: 24 meq/L (ref 22–29)
Chloride: 103 mEq/L (ref 98–109)
Creatinine: 1.5 mg/dL — ABNORMAL HIGH (ref 0.7–1.3)
Glucose: 169 mg/dl — ABNORMAL HIGH (ref 70–140)
POTASSIUM: 4.3 meq/L (ref 3.5–5.1)
Sodium: 139 mEq/L (ref 136–145)
Total Bilirubin: 0.88 mg/dL (ref 0.20–1.20)
Total Protein: 8.5 g/dL — ABNORMAL HIGH (ref 6.4–8.3)

## 2014-01-09 MED ORDER — SODIUM CHLORIDE 0.9 % IJ SOLN
10.0000 mL | INTRAMUSCULAR | Status: DC | PRN
  Administered 2014-01-09: 10 mL
  Filled 2014-01-09: qty 10

## 2014-01-09 MED ORDER — ONDANSETRON 8 MG/NS 50 ML IVPB
INTRAVENOUS | Status: AC
  Filled 2014-01-09: qty 8

## 2014-01-09 MED ORDER — ONDANSETRON 8 MG/50ML IVPB (CHCC)
8.0000 mg | Freq: Once | INTRAVENOUS | Status: AC
  Administered 2014-01-09: 8 mg via INTRAVENOUS

## 2014-01-09 MED ORDER — SODIUM CHLORIDE 0.9 % IV SOLN
Freq: Once | INTRAVENOUS | Status: AC
  Administered 2014-01-09: 16:00:00 via INTRAVENOUS

## 2014-01-09 MED ORDER — HEPARIN SOD (PORK) LOCK FLUSH 100 UNIT/ML IV SOLN
500.0000 [IU] | Freq: Once | INTRAVENOUS | Status: AC | PRN
  Administered 2014-01-09: 500 [IU]
  Filled 2014-01-09: qty 5

## 2014-01-09 MED ORDER — PNEUMOCOCCAL 13-VAL CONJ VACC IM SUSP: 0.5000 mL | INTRAMUSCULAR | Status: DC

## 2014-01-09 MED ORDER — DEXTROSE 5 % IV SOLN
27.0000 mg/m2 | Freq: Once | INTRAVENOUS | Status: AC
  Administered 2014-01-09: 60 mg via INTRAVENOUS
  Filled 2014-01-09: qty 30

## 2014-01-09 NOTE — Addendum Note (Signed)
Addended by: Gerrit Halls T on: 01/09/2014 05:44 PM   Modules accepted: Orders

## 2014-01-09 NOTE — Patient Instructions (Signed)
[facility] Discharge Instructions for Patients Receiving Chemotherapy  Today you received the following chemotherapy agents Kyprolis.  To help prevent nausea and vomiting after your treatment, we encourage you to take your nausea medication.   If you develop nausea and vomiting that is not controlled by your nausea medication, call the clinic.   BELOW ARE SYMPTOMS THAT SHOULD BE REPORTED IMMEDIATELY:  *FEVER GREATER THAN 100.5 F  *CHILLS WITH OR WITHOUT FEVER  NAUSEA AND VOMITING THAT IS NOT CONTROLLED WITH YOUR NAUSEA MEDICATION  *UNUSUAL SHORTNESS OF BREATH  *UNUSUAL BRUISING OR BLEEDING  TENDERNESS IN MOUTH AND THROAT WITH OR WITHOUT PRESENCE OF ULCERS  *URINARY PROBLEMS  *BOWEL PROBLEMS  UNUSUAL RASH Items with * indicate a potential emergency and should be followed up as soon as possible.  Feel free to call the clinic you have any questions or concerns. The clinic phone number is [phone].    

## 2014-01-10 ENCOUNTER — Other Ambulatory Visit: Payer: Self-pay | Admitting: *Deleted

## 2014-01-10 ENCOUNTER — Ambulatory Visit (HOSPITAL_BASED_OUTPATIENT_CLINIC_OR_DEPARTMENT_OTHER): Payer: BC Managed Care – PPO

## 2014-01-10 VITALS — BP 99/86 | HR 72 | Temp 97.7°F | Resp 18

## 2014-01-10 DIAGNOSIS — Z23 Encounter for immunization: Secondary | ICD-10-CM

## 2014-01-10 DIAGNOSIS — C9000 Multiple myeloma not having achieved remission: Secondary | ICD-10-CM

## 2014-01-10 DIAGNOSIS — Z5112 Encounter for antineoplastic immunotherapy: Secondary | ICD-10-CM

## 2014-01-10 MED ORDER — ONDANSETRON HCL 8 MG PO TABS
ORAL_TABLET | ORAL | Status: AC
  Filled 2014-01-10: qty 1

## 2014-01-10 MED ORDER — DEXTROSE 5 % IV SOLN
27.0000 mg/m2 | Freq: Once | INTRAVENOUS | Status: AC
  Administered 2014-01-10: 60 mg via INTRAVENOUS
  Filled 2014-01-10: qty 30

## 2014-01-10 MED ORDER — ONDANSETRON 8 MG/50ML IVPB (CHCC)
8.0000 mg | Freq: Once | INTRAVENOUS | Status: AC
  Administered 2014-01-10: 8 mg via INTRAVENOUS

## 2014-01-10 MED ORDER — PNEUMOCOCCAL 13-VAL CONJ VACC IM SUSP
0.5000 mL | INTRAMUSCULAR | Status: AC
  Administered 2014-01-10: 0.5 mL via INTRAMUSCULAR
  Filled 2014-01-10: qty 0.5

## 2014-01-10 MED ORDER — SODIUM CHLORIDE 0.9 % IV SOLN
Freq: Once | INTRAVENOUS | Status: AC
  Administered 2014-01-10: 16:00:00 via INTRAVENOUS

## 2014-01-10 NOTE — Patient Instructions (Signed)
[facility] Discharge Instructions for Patients Receiving Chemotherapy  Today you received the following chemotherapy agents: Kyprolis  To help prevent nausea and vomiting after your treatment, we encourage you to take your nausea medication: as directed.   If you develop nausea and vomiting that is not controlled by your nausea medication, call the clinic.   BELOW ARE SYMPTOMS THAT SHOULD BE REPORTED IMMEDIATELY:  *FEVER GREATER THAN 100.5 F  *CHILLS WITH OR WITHOUT FEVER  NAUSEA AND VOMITING THAT IS NOT CONTROLLED WITH YOUR NAUSEA MEDICATION  *UNUSUAL SHORTNESS OF BREATH  *UNUSUAL BRUISING OR BLEEDING  TENDERNESS IN MOUTH AND THROAT WITH OR WITHOUT PRESENCE OF ULCERS  *URINARY PROBLEMS  *BOWEL PROBLEMS  UNUSUAL RASH Items with * indicate a potential emergency and should be followed up as soon as possible.  Feel free to call the clinic you have any questions or concerns. The clinic phone number is [phone].    

## 2014-01-13 LAB — KAPPA/LAMBDA LIGHT CHAINS
KAPPA FREE LGHT CHN: 0.03 mg/dL — AB (ref 0.33–1.94)
KAPPA LAMBDA RATIO: 0 — AB (ref 0.26–1.65)
Lambda Free Lght Chn: 36.9 mg/dL — ABNORMAL HIGH (ref 0.57–2.63)

## 2014-01-13 LAB — PROTEIN ELECTROPHORESIS, SERUM
ALBUMIN ELP: 50.4 % — AB (ref 55.8–66.1)
Alpha-1-Globulin: 3.6 % (ref 2.9–4.9)
Alpha-2-Globulin: 7.5 % (ref 7.1–11.8)
BETA GLOBULIN: 5.8 % (ref 4.7–7.2)
Beta 2: 30.4 % — ABNORMAL HIGH (ref 3.2–6.5)
Gamma Globulin: 2.3 % — ABNORMAL LOW (ref 11.1–18.8)
M-Spike, %: 1.76 g/dL
TOTAL PROTEIN, SERUM ELECTROPHOR: 8.4 g/dL — AB (ref 6.0–8.3)

## 2014-01-13 LAB — IGG: IgG (Immunoglobin G), Serum: 2160 mg/dL — ABNORMAL HIGH (ref 650–1600)

## 2014-01-14 ENCOUNTER — Telehealth: Payer: Self-pay | Admitting: *Deleted

## 2014-01-14 NOTE — Telephone Encounter (Signed)
-----   Message from Ladell Pier, MD sent at 01/14/2014  1:35 PM EST ----- Please call patient, light chains and IgG are slightly higher, repeat IgG,lights and spep as scheduled,if higher we will resume revlimid

## 2014-01-15 NOTE — Telephone Encounter (Signed)
Called pt and informed of light chains and IgG are slightly higher and we will repeat tomorrow when labs are done, if higher we will resume revlimid, pt verbalized understanding and denies any questions or concerns.

## 2014-01-16 ENCOUNTER — Other Ambulatory Visit (HOSPITAL_BASED_OUTPATIENT_CLINIC_OR_DEPARTMENT_OTHER): Payer: BC Managed Care – PPO

## 2014-01-16 ENCOUNTER — Ambulatory Visit (HOSPITAL_BASED_OUTPATIENT_CLINIC_OR_DEPARTMENT_OTHER): Payer: BC Managed Care – PPO

## 2014-01-16 DIAGNOSIS — C9000 Multiple myeloma not having achieved remission: Secondary | ICD-10-CM

## 2014-01-16 DIAGNOSIS — Z5112 Encounter for antineoplastic immunotherapy: Secondary | ICD-10-CM

## 2014-01-16 LAB — CBC WITH DIFFERENTIAL/PLATELET
BASO%: 0.4 % (ref 0.0–2.0)
BASOS ABS: 0 10*3/uL (ref 0.0–0.1)
EOS ABS: 0 10*3/uL (ref 0.0–0.5)
EOS%: 0.4 % (ref 0.0–7.0)
HCT: 36.8 % — ABNORMAL LOW (ref 38.4–49.9)
HEMOGLOBIN: 12.2 g/dL — AB (ref 13.0–17.1)
LYMPH#: 0.9 10*3/uL (ref 0.9–3.3)
LYMPH%: 12.2 % — ABNORMAL LOW (ref 14.0–49.0)
MCH: 29.4 pg (ref 27.2–33.4)
MCHC: 33.2 g/dL (ref 32.0–36.0)
MCV: 88.7 fL (ref 79.3–98.0)
MONO#: 0.1 10*3/uL (ref 0.1–0.9)
MONO%: 0.9 % (ref 0.0–14.0)
NEUT#: 6.6 10*3/uL — ABNORMAL HIGH (ref 1.5–6.5)
NEUT%: 86.1 % — ABNORMAL HIGH (ref 39.0–75.0)
Platelets: 216 10*3/uL (ref 140–400)
RBC: 4.15 10*6/uL — ABNORMAL LOW (ref 4.20–5.82)
RDW: 15.1 % — AB (ref 11.0–14.6)
WBC: 7.6 10*3/uL (ref 4.0–10.3)

## 2014-01-16 LAB — BASIC METABOLIC PANEL (CC13)
ANION GAP: 14 meq/L — AB (ref 3–11)
BUN: 14.1 mg/dL (ref 7.0–26.0)
CHLORIDE: 102 meq/L (ref 98–109)
CO2: 22 mEq/L (ref 22–29)
Calcium: 9.7 mg/dL (ref 8.4–10.4)
Creatinine: 1.4 mg/dL — ABNORMAL HIGH (ref 0.7–1.3)
Glucose: 224 mg/dl — ABNORMAL HIGH (ref 70–140)
Potassium: 4.6 mEq/L (ref 3.5–5.1)
SODIUM: 138 meq/L (ref 136–145)

## 2014-01-16 MED ORDER — HEPARIN SOD (PORK) LOCK FLUSH 100 UNIT/ML IV SOLN
500.0000 [IU] | Freq: Once | INTRAVENOUS | Status: AC | PRN
  Administered 2014-01-16: 500 [IU]
  Filled 2014-01-16: qty 5

## 2014-01-16 MED ORDER — SODIUM CHLORIDE 0.9 % IJ SOLN
10.0000 mL | INTRAMUSCULAR | Status: DC | PRN
  Administered 2014-01-16: 10 mL
  Filled 2014-01-16: qty 10

## 2014-01-16 MED ORDER — ONDANSETRON 8 MG/50ML IVPB (CHCC)
8.0000 mg | Freq: Once | INTRAVENOUS | Status: AC
  Administered 2014-01-16: 8 mg via INTRAVENOUS

## 2014-01-16 MED ORDER — DEXTROSE 5 % IV SOLN
27.0000 mg/m2 | Freq: Once | INTRAVENOUS | Status: AC
  Administered 2014-01-16: 60 mg via INTRAVENOUS
  Filled 2014-01-16: qty 30

## 2014-01-16 MED ORDER — ONDANSETRON 8 MG/NS 50 ML IVPB
INTRAVENOUS | Status: AC
  Filled 2014-01-16: qty 8

## 2014-01-16 MED ORDER — SODIUM CHLORIDE 0.9 % IV SOLN
Freq: Once | INTRAVENOUS | Status: AC
  Administered 2014-01-16: 16:00:00 via INTRAVENOUS

## 2014-01-17 ENCOUNTER — Ambulatory Visit (HOSPITAL_BASED_OUTPATIENT_CLINIC_OR_DEPARTMENT_OTHER): Payer: BC Managed Care – PPO

## 2014-01-17 DIAGNOSIS — Z5112 Encounter for antineoplastic immunotherapy: Secondary | ICD-10-CM

## 2014-01-17 DIAGNOSIS — C9000 Multiple myeloma not having achieved remission: Secondary | ICD-10-CM

## 2014-01-17 MED ORDER — ONDANSETRON 8 MG/NS 50 ML IVPB
INTRAVENOUS | Status: AC
  Filled 2014-01-17: qty 8

## 2014-01-17 MED ORDER — ONDANSETRON 8 MG/50ML IVPB (CHCC)
8.0000 mg | Freq: Once | INTRAVENOUS | Status: AC
  Administered 2014-01-17: 8 mg via INTRAVENOUS

## 2014-01-17 MED ORDER — DEXTROSE 5 % IV SOLN
27.0000 mg/m2 | Freq: Once | INTRAVENOUS | Status: AC
  Administered 2014-01-17: 60 mg via INTRAVENOUS
  Filled 2014-01-17: qty 30

## 2014-01-17 MED ORDER — SODIUM CHLORIDE 0.9 % IV SOLN
Freq: Once | INTRAVENOUS | Status: AC
  Administered 2014-01-17: 16:00:00 via INTRAVENOUS

## 2014-01-17 NOTE — Patient Instructions (Signed)
[facility] Discharge Instructions for Patients Receiving Chemotherapy  Today you received the following chemotherapy agents: Kyprolis  To help prevent nausea and vomiting after your treatment, we encourage you to take your nausea medication as prescribed by your physician.   If you develop nausea and vomiting that is not controlled by your nausea medication, call the clinic.   BELOW ARE SYMPTOMS THAT SHOULD BE REPORTED IMMEDIATELY:  *FEVER GREATER THAN 100.5 F  *CHILLS WITH OR WITHOUT FEVER  NAUSEA AND VOMITING THAT IS NOT CONTROLLED WITH YOUR NAUSEA MEDICATION  *UNUSUAL SHORTNESS OF BREATH  *UNUSUAL BRUISING OR BLEEDING  TENDERNESS IN MOUTH AND THROAT WITH OR WITHOUT PRESENCE OF ULCERS  *URINARY PROBLEMS  *BOWEL PROBLEMS  UNUSUAL RASH Items with * indicate a potential emergency and should be followed up as soon as possible.  Feel free to call the clinic you have any questions or concerns. The clinic phone number is [phone].    

## 2014-01-20 ENCOUNTER — Telehealth: Payer: Self-pay | Admitting: *Deleted

## 2014-01-20 NOTE — Telephone Encounter (Signed)
Message from pt requesting to move Lab/ chemo appts to Wed and Thurs of this week.

## 2014-01-21 ENCOUNTER — Telehealth: Payer: Self-pay | Admitting: *Deleted

## 2014-01-21 NOTE — Telephone Encounter (Signed)
Per patient request I have mocved up the appt on 11/12

## 2014-01-21 NOTE — Telephone Encounter (Signed)
OK to move appts one day earlier, per Dr. Benay Spice. Request to [facility], chemo scheduler. Left message on voicemail informing pt of changes.

## 2014-01-22 ENCOUNTER — Ambulatory Visit: Payer: BC Managed Care – PPO

## 2014-01-22 ENCOUNTER — Other Ambulatory Visit (HOSPITAL_BASED_OUTPATIENT_CLINIC_OR_DEPARTMENT_OTHER): Payer: BC Managed Care – PPO

## 2014-01-22 ENCOUNTER — Ambulatory Visit (HOSPITAL_BASED_OUTPATIENT_CLINIC_OR_DEPARTMENT_OTHER): Payer: BC Managed Care – PPO

## 2014-01-22 ENCOUNTER — Telehealth: Payer: Self-pay | Admitting: Nurse Practitioner

## 2014-01-22 DIAGNOSIS — Z95828 Presence of other vascular implants and grafts: Secondary | ICD-10-CM

## 2014-01-22 DIAGNOSIS — C9000 Multiple myeloma not having achieved remission: Secondary | ICD-10-CM

## 2014-01-22 DIAGNOSIS — Z5112 Encounter for antineoplastic immunotherapy: Secondary | ICD-10-CM

## 2014-01-22 LAB — CBC WITH DIFFERENTIAL/PLATELET
BASO%: 0.5 % (ref 0.0–2.0)
BASOS ABS: 0 10*3/uL (ref 0.0–0.1)
EOS ABS: 0.2 10*3/uL (ref 0.0–0.5)
EOS%: 3.5 % (ref 0.0–7.0)
HCT: 35 % — ABNORMAL LOW (ref 38.4–49.9)
HEMOGLOBIN: 11.8 g/dL — AB (ref 13.0–17.1)
LYMPH%: 24.1 % (ref 14.0–49.0)
MCH: 29.5 pg (ref 27.2–33.4)
MCHC: 33.7 g/dL (ref 32.0–36.0)
MCV: 87.5 fL (ref 79.3–98.0)
MONO#: 0.4 10*3/uL (ref 0.1–0.9)
MONO%: 7.5 % (ref 0.0–14.0)
NEUT%: 64.4 % (ref 39.0–75.0)
NEUTROS ABS: 3.7 10*3/uL (ref 1.5–6.5)
PLATELETS: 180 10*3/uL (ref 140–400)
RBC: 4 10*6/uL — ABNORMAL LOW (ref 4.20–5.82)
RDW: 15.1 % — ABNORMAL HIGH (ref 11.0–14.6)
WBC: 5.8 10*3/uL (ref 4.0–10.3)
lymph#: 1.4 10*3/uL (ref 0.9–3.3)

## 2014-01-22 MED ORDER — ONDANSETRON 8 MG/NS 50 ML IVPB
INTRAVENOUS | Status: AC
  Filled 2014-01-22: qty 8

## 2014-01-22 MED ORDER — DEXTROSE 5 % IV SOLN
27.0000 mg/m2 | Freq: Once | INTRAVENOUS | Status: AC
  Administered 2014-01-22: 60 mg via INTRAVENOUS
  Filled 2014-01-22: qty 30

## 2014-01-22 MED ORDER — SODIUM CHLORIDE 0.9 % IJ SOLN
10.0000 mL | INTRAMUSCULAR | Status: DC | PRN
  Administered 2014-01-22: 10 mL via INTRAVENOUS
  Filled 2014-01-22: qty 10

## 2014-01-22 MED ORDER — SODIUM CHLORIDE 0.9 % IV SOLN
Freq: Once | INTRAVENOUS | Status: AC
  Administered 2014-01-22: 15:00:00 via INTRAVENOUS

## 2014-01-22 MED ORDER — HEPARIN SOD (PORK) LOCK FLUSH 100 UNIT/ML IV SOLN
500.0000 [IU] | Freq: Once | INTRAVENOUS | Status: AC | PRN
  Administered 2014-01-22: 500 [IU]
  Filled 2014-01-22: qty 5

## 2014-01-22 MED ORDER — SODIUM CHLORIDE 0.9 % IJ SOLN
10.0000 mL | INTRAMUSCULAR | Status: DC | PRN
  Administered 2014-01-22: 10 mL
  Filled 2014-01-22: qty 10

## 2014-01-22 MED ORDER — ONDANSETRON 8 MG/50ML IVPB (CHCC)
8.0000 mg | Freq: Once | INTRAVENOUS | Status: AC
  Administered 2014-01-22: 8 mg via INTRAVENOUS

## 2014-01-22 NOTE — Patient Instructions (Signed)

## 2014-01-22 NOTE — Patient Instructions (Signed)
[facility] Discharge Instructions for Patients Receiving Chemotherapy  Today you received the following chemotherapy agents: Kyprolis  To help prevent nausea and vomiting after your treatment, we encourage you to take your nausea medication: as directed.   If you develop nausea and vomiting that is not controlled by your nausea medication, call the clinic.   BELOW ARE SYMPTOMS THAT SHOULD BE REPORTED IMMEDIATELY:  *FEVER GREATER THAN 100.5 F  *CHILLS WITH OR WITHOUT FEVER  NAUSEA AND VOMITING THAT IS NOT CONTROLLED WITH YOUR NAUSEA MEDICATION  *UNUSUAL SHORTNESS OF BREATH  *UNUSUAL BRUISING OR BLEEDING  TENDERNESS IN MOUTH AND THROAT WITH OR WITHOUT PRESENCE OF ULCERS  *URINARY PROBLEMS  *BOWEL PROBLEMS  UNUSUAL RASH Items with * indicate a potential emergency and should be followed up as soon as possible.  Feel free to call the clinic you have any questions or concerns. The clinic phone number is [phone].    

## 2014-01-22 NOTE — Telephone Encounter (Signed)
He has a hx of chest pain . Has had a normal cath in 2012 and has had normal myoviews I suspect his CP is due to his elevated BP We can increase his diovan to 320 a day He does need to watch his salt intake Continue to check BP

## 2014-01-22 NOTE — Telephone Encounter (Signed)
Spoke with patient and reviewed Dr. Elmarie Shiley advice with him.  Patient verbalized understanding and agreement to take Diovan 320 mg and to reduce sodium intake.  Patient is going to monitor BP and call back Friday to let us know if it has improved.  Patient states he is currently getting his infusion for multiple myeloma and states his BP upon arrival at clinic was 834'H systolic over 96'Q diastolic.  I advised patient to call back Friday with information or sooner if symptoms worsen.  Patient verbalized understanding and agreement.

## 2014-01-22 NOTE — Telephone Encounter (Signed)
Received call from patient who states his blood pressure is elevated at 190/120 today.  Patient states he is also having some burning across his chest.  States heart rate is generally around 90 bpm.  Patient states he is experiencing bilateral feet swelling; taking Lasix 40 mg daily.  Patient states no other changes in medication for myeloma and no other lifestyle changes.  Patient states his diet is probably too high in sodium but states he has not increased sodium intake recently; states he has followed the same diet for the last 30 years.  Patient states blood pressure earlier in the week was 160/117 and states that when he has gone for his chemo infusions that nurses have noted that his BP has been elevated.  We recently increased his Diovan for elevated BP and on 10/22 patient reported satisfactory blood pressure readings (see telephone encounter).  I advised patient that I will review with Dr. Acie Fredrickson and call him back with Dr. Elmarie Shiley advice.  Patient verbalized understanding and agreement with plan of care.

## 2014-01-23 ENCOUNTER — Other Ambulatory Visit: Payer: BC Managed Care – PPO

## 2014-01-23 ENCOUNTER — Ambulatory Visit (HOSPITAL_BASED_OUTPATIENT_CLINIC_OR_DEPARTMENT_OTHER): Payer: BC Managed Care – PPO

## 2014-01-23 DIAGNOSIS — Z5112 Encounter for antineoplastic immunotherapy: Secondary | ICD-10-CM

## 2014-01-23 DIAGNOSIS — C9000 Multiple myeloma not having achieved remission: Secondary | ICD-10-CM

## 2014-01-23 MED ORDER — ONDANSETRON 8 MG/NS 50 ML IVPB
INTRAVENOUS | Status: AC
  Filled 2014-01-23: qty 8

## 2014-01-23 MED ORDER — ONDANSETRON 8 MG/50ML IVPB (CHCC)
8.0000 mg | Freq: Once | INTRAVENOUS | Status: AC
  Administered 2014-01-23: 8 mg via INTRAVENOUS

## 2014-01-23 MED ORDER — HEPARIN SOD (PORK) LOCK FLUSH 100 UNIT/ML IV SOLN
500.0000 [IU] | Freq: Once | INTRAVENOUS | Status: AC | PRN
  Administered 2014-01-23: 500 [IU]
  Filled 2014-01-23: qty 5

## 2014-01-23 MED ORDER — SODIUM CHLORIDE 0.9 % IV SOLN
Freq: Once | INTRAVENOUS | Status: AC
  Administered 2014-01-23: 10:00:00 via INTRAVENOUS

## 2014-01-23 MED ORDER — CARFILZOMIB CHEMO INJECTION 60 MG
27.0000 mg/m2 | Freq: Once | INTRAVENOUS | Status: AC
  Administered 2014-01-23: 60 mg via INTRAVENOUS
  Filled 2014-01-23: qty 30

## 2014-01-23 MED ORDER — SODIUM CHLORIDE 0.9 % IJ SOLN
10.0000 mL | INTRAMUSCULAR | Status: DC | PRN
  Administered 2014-01-23: 10 mL
  Filled 2014-01-23: qty 10

## 2014-01-23 NOTE — Patient Instructions (Signed)
[facility] Discharge Instructions for Patients Receiving Chemotherapy  Today you received the following chemotherapy agents: Kyprolis  To help prevent nausea and vomiting after your treatment, we encourage you to take your nausea medication: as directed.   If you develop nausea and vomiting that is not controlled by your nausea medication, call the clinic.   BELOW ARE SYMPTOMS THAT SHOULD BE REPORTED IMMEDIATELY:  *FEVER GREATER THAN 100.5 F  *CHILLS WITH OR WITHOUT FEVER  NAUSEA AND VOMITING THAT IS NOT CONTROLLED WITH YOUR NAUSEA MEDICATION  *UNUSUAL SHORTNESS OF BREATH  *UNUSUAL BRUISING OR BLEEDING  TENDERNESS IN MOUTH AND THROAT WITH OR WITHOUT PRESENCE OF ULCERS  *URINARY PROBLEMS  *BOWEL PROBLEMS  UNUSUAL RASH Items with * indicate a potential emergency and should be followed up as soon as possible.  Feel free to call the clinic you have any questions or concerns. The clinic phone number is [phone].    

## 2014-01-24 ENCOUNTER — Ambulatory Visit: Payer: BC Managed Care – PPO

## 2014-01-29 ENCOUNTER — Other Ambulatory Visit: Payer: Self-pay | Admitting: Oncology

## 2014-01-31 ENCOUNTER — Other Ambulatory Visit: Payer: Self-pay | Admitting: *Deleted

## 2014-01-31 ENCOUNTER — Encounter: Payer: Self-pay | Admitting: *Deleted

## 2014-01-31 DIAGNOSIS — C9000 Multiple myeloma not having achieved remission: Secondary | ICD-10-CM

## 2014-01-31 DIAGNOSIS — N08 Glomerular disorders in diseases classified elsewhere: Principal | ICD-10-CM

## 2014-01-31 MED ORDER — FENTANYL 25 MCG/HR TD PT72: 25.0000 ug | MEDICATED_PATCH | TRANSDERMAL | Status: DC

## 2014-01-31 NOTE — Telephone Encounter (Signed)
Left message for pt that re-fill request for Fentanyl patch is ready for pick-up at his convenience.

## 2014-02-03 ENCOUNTER — Encounter: Payer: Self-pay | Admitting: Oncology

## 2014-02-07 ENCOUNTER — Other Ambulatory Visit: Payer: Self-pay | Admitting: Oncology

## 2014-02-11 ENCOUNTER — Encounter: Payer: Self-pay | Admitting: Cardiovascular Disease

## 2014-02-11 ENCOUNTER — Other Ambulatory Visit (INDEPENDENT_AMBULATORY_CARE_PROVIDER_SITE_OTHER): Payer: BC Managed Care – PPO | Admitting: *Deleted

## 2014-02-11 ENCOUNTER — Ambulatory Visit (INDEPENDENT_AMBULATORY_CARE_PROVIDER_SITE_OTHER): Payer: BC Managed Care – PPO | Admitting: Cardiovascular Disease

## 2014-02-11 VITALS — BP 115/80 | HR 60 | Ht 72.0 in | Wt 225.0 lb

## 2014-02-11 DIAGNOSIS — I10 Essential (primary) hypertension: Secondary | ICD-10-CM

## 2014-02-11 HISTORY — DX: Essential (primary) hypertension: I10

## 2014-02-11 NOTE — Patient Instructions (Signed)
Your physician has recommended you make the following change in your medication:  TAKE Diovan 160 mg at night  Your physician recommends that you schedule a follow-up appointment in: 3 months with Dr. Acie Fredrickson

## 2014-02-11 NOTE — Assessment & Plan Note (Signed)
Walter Dennis presents with variable BP readings - typically very high in the AM. He has tried taking Diovan 80 mg twice a day but this is only helped a little bit.  He's careful with his salt intake. His activities are somewhat limited. He's had lots of fatigue-likely due to the chemotherapy.  We'll have him start taking his Diovan 160 mg at night. This should greatly reduced his blood pressure in the early morning hours.   He'll continue to monitor his blood pressure on regular basis. We'll continue to titrate up as needed. I'll see him in 3 months for followup visit. We'll check a basic metabolic profile at that time.

## 2014-02-11 NOTE — Progress Notes (Signed)
Walter Dennis Date of Birth  [birth year]       [facility] Office [address], [address] [place] [place], [place], [place]  [address]   [place], [place]  [address] [place]   Fax  [phone]     Fax [phone]  Problem List: 1. Multiple myeloma 2. Chest pain-negative stress Myoview study in July, 2014  History of Present Illness:   Walter Dennis has been seen in the past for exertional CP ( in July 2014).  His myoview was negative.    He now presents with the same pain chest burning with exertion, associated with dyspnea.  He has to stop and rest to catch his breath.    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   He had a heart cath ~ 2012 which was normal.   Dec. 1, 2015:  Walter Dennis is seen as a work in visit for HTN BP is typically high in the AM , goes down through the day and then back up in the evening.    Avoiding salt.  Walks quite a bit through the day.    Current Outpatient Prescriptions on File Prior to Visit  Medication Sig Dispense Refill  . acyclovir (ZOVIRAX) 400 MG tablet Take 400 mg by mouth every 12 (twelve) hours. Take 1 tablet by mouth every 12 hours to prevent shingles while on chemo    . aspirin 81 MG chewable tablet Chew 81 mg by mouth daily.    . carvedilol (COREG) 6.25 MG tablet TAKE 1 TABLET BY MOUTH TWICE A DAY 60 tablet 0  . dexamethasone (DECADRON) 4 MG tablet Take 5 tablets (20 mg total) by mouth once a week. Once weekly on Thursday. 20 tablet 2  . esomeprazole (NEXIUM) 40 MG capsule TAKE ONE CAPSULE EVERY DAY 90 capsule 0  . fentaNYL (DURAGESIC - DOSED MCG/HR) 25 MCG/HR patch Place 1 patch (25 mcg total) onto the skin every 3 (three) days. 10 patch 0  . furosemide (LASIX) 40 MG tablet TAKE 1 TABLET BY MOUTH EVERY DAY 60 tablet 0  . HYDROcodone-acetaminophen (NORCO) 10-325 MG per tablet Take 1 tablet by mouth every 6 (six) hours as needed. 60 tablet 0  . nitroGLYCERIN (NITROSTAT) 0.3 MG SL tablet  Place 1 tablet (0.3 mg total) under the tongue every 5 (five) minutes as needed for chest pain. No more than 3 doses in period of 15 minutes per episode. 90 tablet 1  . ondansetron (ZOFRAN) 8 MG tablet Take 1 tablet (8 mg total) by mouth every 12 (twelve) hours as needed for nausea. 30 tablet 1  . potassium chloride (K-DUR) 10 MEQ tablet Take 1 tablet (10 mEq total) by mouth daily. 90 tablet 3  . pregabalin (LYRICA) 50 MG capsule Take 1 capsule (50 mg total) by mouth 2 (two) times daily. 60 capsule 3  . PRESCRIPTION MEDICATION Inject 60 mg into the vein once. carfilzomib (KYPROLIS) 60 mg in dextrose 5 % 50 mL chemo infusion 27 mg/m2  2.2 m2 (Treatment Plan Actual) Once 09/27/2012    . valsartan (DIOVAN) 160 MG tablet Take 1 tablet (160 mg total) by mouth daily. 90 tablet 3   Current Facility-Administered Medications on File Prior to Visit  Medication Dose Route Frequency Provider Last Rate Last Dose  . sodium chloride 0.9 % injection 10 mL  10 mL Intravenous PRN Ladell Pier, MD   10 mL at 11/14/13 1510  .  sodium chloride 0.9 % injection 10 mL  10 mL Intracatheter PRN Ladell Pier, MD   10 mL at 11/21/13 1722    No Known Allergies  Past Medical History  Diagnosis Date  . Hypertension   . Cancer     Past Surgical History  Procedure Laterality Date  . Portacath placement      right chest    History  Smoking status  . Former Smoker  . Quit date: 09/29/2002  Smokeless tobacco  . Never Used    History  Alcohol Use No    No family history on file.  Reviw of Systems:  Reviewed in the HPI.  All other systems are negative.  Physical Exam: Blood pressure 115/80, pulse 60, height 6' (1.829 m), weight 225 lb (102.059 kg). Wt Readings from Last 3 Encounters:  02/11/14 225 lb (102.059 kg)  01/08/14 222 lb 14.4 oz (101.107 kg)  12/12/13 222 lb 12.8 oz (101.061 kg)     General: Well developed, well nourished, in no acute distress.  Head: Normocephalic, atraumatic, sclera  non-icteric, mucus membranes are moist,   Neck: Supple. Carotids are 2 + without bruits. No JVD   Lungs: Clear   Heart: RR, normal s1s2  Abdomen: Soft, non-tender, non-distended with normal bowel sounds.  Msk:  Strength and tone are normal   Extremities: No clubbing or cyanosis. No edema.  Distal pedal pulses are 2+ and equal    Neuro: CN II - XII intact.  Alert and oriented X 3.   Psych:  Normal   ECG: Jul 22, 2013:  Sinus tach at 102.  Otherwise normal  Assessment / Plan:

## 2014-02-12 LAB — BASIC METABOLIC PANEL
BUN: 18 mg/dL (ref 6–23)
CO2: 26 mEq/L (ref 19–32)
Calcium: 8.9 mg/dL (ref 8.4–10.5)
Chloride: 103 mEq/L (ref 96–112)
Creatinine, Ser: 1.1 mg/dL (ref 0.4–1.5)
GFR: 69.92 mL/min (ref >60.00)
Glucose, Bld: 92 mg/dL (ref 70–99)
POTASSIUM: 3.6 meq/L (ref 3.5–5.1)
SODIUM: 137 meq/L (ref 135–145)

## 2014-02-13 ENCOUNTER — Other Ambulatory Visit (HOSPITAL_BASED_OUTPATIENT_CLINIC_OR_DEPARTMENT_OTHER): Payer: BC Managed Care – PPO

## 2014-02-13 ENCOUNTER — Ambulatory Visit (HOSPITAL_BASED_OUTPATIENT_CLINIC_OR_DEPARTMENT_OTHER): Payer: BC Managed Care – PPO

## 2014-02-13 ENCOUNTER — Other Ambulatory Visit: Payer: Self-pay | Admitting: *Deleted

## 2014-02-13 ENCOUNTER — Ambulatory Visit (HOSPITAL_BASED_OUTPATIENT_CLINIC_OR_DEPARTMENT_OTHER): Payer: BC Managed Care – PPO | Admitting: Oncology

## 2014-02-13 ENCOUNTER — Telehealth: Payer: Self-pay | Admitting: Oncology

## 2014-02-13 VITALS — BP 142/57 | HR 115 | Temp 98.2°F | Resp 20 | Ht 72.0 in | Wt 224.0 lb

## 2014-02-13 DIAGNOSIS — Z5112 Encounter for antineoplastic immunotherapy: Secondary | ICD-10-CM

## 2014-02-13 DIAGNOSIS — C9000 Multiple myeloma not having achieved remission: Secondary | ICD-10-CM

## 2014-02-13 LAB — COMPREHENSIVE METABOLIC PANEL (CC13)
ALT: 41 U/L (ref 0–55)
ANION GAP: 15 meq/L — AB (ref 3–11)
AST: 22 U/L (ref 5–34)
Albumin: 3.9 g/dL (ref 3.5–5.0)
Alkaline Phosphatase: 61 U/L (ref 40–150)
BUN: 14.6 mg/dL (ref 7.0–26.0)
CHLORIDE: 102 meq/L (ref 98–109)
CO2: 22 meq/L (ref 22–29)
CREATININE: 1.4 mg/dL — AB (ref 0.7–1.3)
Calcium: 9.9 mg/dL (ref 8.4–10.4)
EGFR: 55 mL/min/{1.73_m2} — ABNORMAL LOW (ref >90)
Glucose: 206 mg/dl — ABNORMAL HIGH (ref 70–140)
Potassium: 4.3 mEq/L (ref 3.5–5.1)
Sodium: 139 mEq/L (ref 136–145)
Total Bilirubin: 0.84 mg/dL (ref 0.20–1.20)
Total Protein: 8.8 g/dL — ABNORMAL HIGH (ref 6.4–8.3)

## 2014-02-13 LAB — CBC WITH DIFFERENTIAL/PLATELET
BASO%: 0.7 % (ref 0.0–2.0)
BASOS ABS: 0.1 10*3/uL (ref 0.0–0.1)
EOS ABS: 0 10*3/uL (ref 0.0–0.5)
EOS%: 0.3 % (ref 0.0–7.0)
HEMATOCRIT: 37.5 % — AB (ref 38.4–49.9)
HEMOGLOBIN: 12.3 g/dL — AB (ref 13.0–17.1)
LYMPH%: 8.1 % — ABNORMAL LOW (ref 14.0–49.0)
MCH: 29.5 pg (ref 27.2–33.4)
MCHC: 32.7 g/dL (ref 32.0–36.0)
MCV: 90.3 fL (ref 79.3–98.0)
MONO#: 0.1 10*3/uL (ref 0.1–0.9)
MONO%: 0.9 % (ref 0.0–14.0)
NEUT#: 6.7 10*3/uL — ABNORMAL HIGH (ref 1.5–6.5)
NEUT%: 90 % — AB (ref 39.0–75.0)
Platelets: 275 10*3/uL (ref 140–400)
RBC: 4.15 10*6/uL — ABNORMAL LOW (ref 4.20–5.82)
RDW: 16.4 % — ABNORMAL HIGH (ref 11.0–14.6)
WBC: 7.5 10*3/uL (ref 4.0–10.3)
lymph#: 0.6 10*3/uL — ABNORMAL LOW (ref 0.9–3.3)

## 2014-02-13 MED ORDER — DEXTROSE 5 % IV SOLN
27.0000 mg/m2 | Freq: Once | INTRAVENOUS | Status: AC
  Administered 2014-02-13: 60 mg via INTRAVENOUS
  Filled 2014-02-13: qty 30

## 2014-02-13 MED ORDER — ACETAMINOPHEN 325 MG PO TABS
650.0000 mg | ORAL_TABLET | Freq: Once | ORAL | Status: AC
  Administered 2014-02-13: 650 mg via ORAL

## 2014-02-13 MED ORDER — SODIUM CHLORIDE 0.9 % IV SOLN
Freq: Once | INTRAVENOUS | Status: AC
  Administered 2014-02-13: 17:00:00 via INTRAVENOUS

## 2014-02-13 MED ORDER — ONDANSETRON 8 MG/50ML IVPB (CHCC)
8.0000 mg | Freq: Once | INTRAVENOUS | Status: AC
  Administered 2014-02-13: 8 mg via INTRAVENOUS

## 2014-02-13 MED ORDER — SODIUM CHLORIDE 0.9 % IJ SOLN
10.0000 mL | INTRAMUSCULAR | Status: DC | PRN
  Administered 2014-02-13: 10 mL
  Filled 2014-02-13: qty 10

## 2014-02-13 MED ORDER — ACETAMINOPHEN 325 MG PO TABS
ORAL_TABLET | ORAL | Status: AC
  Filled 2014-02-13: qty 2

## 2014-02-13 MED ORDER — AZITHROMYCIN 250 MG PO TABS: ORAL_TABLET | ORAL | Status: DC

## 2014-02-13 MED ORDER — HEPARIN SOD (PORK) LOCK FLUSH 100 UNIT/ML IV SOLN
500.0000 [IU] | Freq: Once | INTRAVENOUS | Status: AC | PRN
Start: 2014-02-13 — End: 2014-02-13
  Administered 2014-02-13: 500 [IU]
  Filled 2014-02-13: qty 5

## 2014-02-13 NOTE — Telephone Encounter (Signed)
Pt confirmed labs/ov per 12/03 POF, gave pt AVS.... KJ, sent msg to add chemo °

## 2014-02-13 NOTE — Patient Instructions (Signed)
[facility] Discharge Instructions for Patients Receiving Chemotherapy  Today you received the following chemotherapy agents:  Kyprolis  To help prevent nausea and vomiting after your treatment, we encourage you to take your nausea medication as ordered per MD.   If you develop nausea and vomiting that is not controlled by your nausea medication, call the clinic.   BELOW ARE SYMPTOMS THAT SHOULD BE REPORTED IMMEDIATELY:  *FEVER GREATER THAN 100.5 F  *CHILLS WITH OR WITHOUT FEVER  NAUSEA AND VOMITING THAT IS NOT CONTROLLED WITH YOUR NAUSEA MEDICATION  *UNUSUAL SHORTNESS OF BREATH  *UNUSUAL BRUISING OR BLEEDING  TENDERNESS IN MOUTH AND THROAT WITH OR WITHOUT PRESENCE OF ULCERS  *URINARY PROBLEMS  *BOWEL PROBLEMS  UNUSUAL RASH Items with * indicate a potential emergency and should be followed up as soon as possible.  Feel free to call the clinic you have any questions or concerns. The clinic phone number is [phone].

## 2014-02-13 NOTE — Progress Notes (Signed)
Walter Dennis   Diagnosis: Multiple myeloma  INTERVAL HISTORY:   Walter Dennis returns as scheduled. He continues weekly Decadron and treatment with Carfilzomib. He reports increased malaise and lower leg pain over the past month. He remains on a Duragesic patch. He developed a cough and sinus drainage beginning 2 days ago. No fever.  Objective:  Vital signs in last 24 hours:  Blood pressure 129/85, pulse 70, temperature 97.6 F (36.4 C), temperature source Oral, resp. rate 18, height 6' (1.829 m), weight 134 lb 12.8 oz (61.145 kg).    HEENT: No thrush or ulcers, pharynx without erythema Resp: Lungs clear bilaterally in the anterior and posterior lung fields Cardio: Regular rate and rhythm GI: No hepatosplenomegaly Vascular: No leg edema   Portacath/PICC-without erythema  Lab Results:  Lab Results  Component Value Date   WBC 7.5 02/13/2014   HGB 12.3* 02/13/2014   HCT 37.5* 02/13/2014   MCV 90.3 02/13/2014   PLT 275 02/13/2014   NEUTROABS 6.7* 02/13/2014   01/09/2014-serum M spike 1.76, free lambda light chains 36.9, IgG 2160    Imaging:  No results found.  Medications: I have reviewed the patient's current medications.  Assessment/Plan: 1.Multiple myeloma, IgG lambda monoclonal protein  -Initial diagnosis 2008, bone marrow with a 40-50% plasma cells  -Lenalidomide 25 mg,day 1-21 and Decadron 40 mg weekly and 8315, complicated by peripheral neuropathy  -Therapy change to bortezomib IV twice weekly plus Decadron  -High-dose chemotherapy with autologous stem cell transplantation 2009 at [facility]  -Bortezomib, cyclophosphamide, and Decadron December 2012 through February 2014 (discontinued secondary to pulmonary aspergillosis)  -January 2014 M spike rise to 1.1 g/dL from 0.1 g/dL in December 2013. Treatment started with Carlfilzomib, lenalidomide, and Decadron.  -treatment was restarted with Carlfilzomib, lenalidomide, and  Decadron 08/16/2012.  -Restaging labs on 10/15/2012 consistent with improvement in the serum M. Protein.  -Continuation of Carfilzomib/Revlimid/dexamethasone.  -Continued improvement in the serum M spike and IgG level on 11/29/2012.  -Improvement in the serum M spike, slight increase in IgG on 01/03/2013.  -Improvement in the serum M spike, stable IgG, stable lambda free light chains 01/31/2013  -The IgG and serum M spike were slightly higher 02/28/2013, stable lambda light chains  -IgG, M spike, and lambda light chains slightly lower on 04/04/2013.  -Continuation of Carfilzomib/Revlimid/dexamethasone.  -IgG, M spike and lambda light chains stable 05/02/2013.  -IgG, serum M spike, and lambda free light chains stable on 07/11/2013; 08/08/2013.  -IgG, serum M spike, and lambda light chains higher 10/17/2013 after being off treatment during July 2015  -Carfilomib/decadron continue with a cycle starting 10/17/2013  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 He has been unable to lower the Duragesic patch dose  4. Irritable bowel syndrome  5. Hypertension  6. "Chest pain "and dyspnea with exertion ,evaluated by cardiology, . Normal stress nuclear study 11/07/2012 and 08/07/2013.  7. history of Diarrhea. Most likely related to Revlimid  8. febrile illness 12/05/2013 with associated diffuse bone pain-no source for infection identified    Disposition:  He appears stable. He most likely has a viral upper respiratory infection today. With his history of multiple myeloma and ongoing systemic therapy he was prescribed a course of azithromycin. He will contact us for a fever or new symptoms.  Walter Dennis will continue Carfilzomib/Decadron. We will follow-up on the myeloma panel from today and decide on resuming Revlimid. He will return for an office visit in one month.  Walter Coder, MD  02/13/2014  4:26 PM

## 2014-02-14 ENCOUNTER — Ambulatory Visit (HOSPITAL_BASED_OUTPATIENT_CLINIC_OR_DEPARTMENT_OTHER): Payer: BC Managed Care – PPO

## 2014-02-14 DIAGNOSIS — Z5112 Encounter for antineoplastic immunotherapy: Secondary | ICD-10-CM

## 2014-02-14 DIAGNOSIS — C9000 Multiple myeloma not having achieved remission: Secondary | ICD-10-CM

## 2014-02-14 MED ORDER — HEPARIN SOD (PORK) LOCK FLUSH 100 UNIT/ML IV SOLN
500.0000 [IU] | Freq: Once | INTRAVENOUS | Status: AC | PRN
  Administered 2014-02-14: 500 [IU]
  Filled 2014-02-14: qty 5

## 2014-02-14 MED ORDER — DEXTROSE 5 % IV SOLN
27.0000 mg/m2 | Freq: Once | INTRAVENOUS | Status: AC
  Administered 2014-02-14: 60 mg via INTRAVENOUS
  Filled 2014-02-14: qty 30

## 2014-02-14 MED ORDER — ONDANSETRON 8 MG/50ML IVPB (CHCC)
8.0000 mg | Freq: Once | INTRAVENOUS | Status: AC
  Administered 2014-02-14: 8 mg via INTRAVENOUS

## 2014-02-14 MED ORDER — SODIUM CHLORIDE 0.9 % IV SOLN
Freq: Once | INTRAVENOUS | Status: AC
  Administered 2014-02-14: 16:00:00 via INTRAVENOUS

## 2014-02-14 MED ORDER — ONDANSETRON 8 MG/NS 50 ML IVPB
INTRAVENOUS | Status: AC
  Filled 2014-02-14: qty 8

## 2014-02-14 MED ORDER — SODIUM CHLORIDE 0.9 % IJ SOLN
10.0000 mL | INTRAMUSCULAR | Status: DC | PRN
  Administered 2014-02-14: 10 mL
  Filled 2014-02-14: qty 10

## 2014-02-14 NOTE — Patient Instructions (Signed)
[facility] Discharge Instructions for Patients Receiving Chemotherapy  Today you received the following chemotherapy agents kyprolis  To help prevent nausea and vomiting after your treatment, we encourage you to take your nausea medication as directed   If you develop nausea and vomiting that is not controlled by your nausea medication, call the clinic.   BELOW ARE SYMPTOMS THAT SHOULD BE REPORTED IMMEDIATELY:  *FEVER GREATER THAN 100.5 F  *CHILLS WITH OR WITHOUT FEVER  NAUSEA AND VOMITING THAT IS NOT CONTROLLED WITH YOUR NAUSEA MEDICATION  *UNUSUAL SHORTNESS OF BREATH  *UNUSUAL BRUISING OR BLEEDING  TENDERNESS IN MOUTH AND THROAT WITH OR WITHOUT PRESENCE OF ULCERS  *URINARY PROBLEMS  *BOWEL PROBLEMS  UNUSUAL RASH Items with * indicate a potential emergency and should be followed up as soon as possible.  Feel free to call the clinic you have any questions or concerns. The clinic phone number is [phone].  

## 2014-02-17 LAB — PROTEIN ELECTROPHORESIS, SERUM
ALPHA-1-GLOBULIN: 3.8 % (ref 2.9–4.9)
ALPHA-2-GLOBULIN: 7.6 % (ref 7.1–11.8)
Albumin ELP: 49.4 % — ABNORMAL LOW (ref 55.8–66.1)
BETA 2: 31.1 % — AB (ref 3.2–6.5)
Beta Globulin: 5.7 % (ref 4.7–7.2)
GAMMA GLOBULIN: 2.4 % — AB (ref 11.1–18.8)
M-Spike, %: 2.27 g/dL
Total Protein, Serum Electrophoresis: 9.5 g/dL — ABNORMAL HIGH (ref 6.0–8.3)

## 2014-02-17 LAB — KAPPA/LAMBDA LIGHT CHAINS
Kappa free light chain: 0.15 mg/dL — ABNORMAL LOW (ref 0.33–1.94)
Kappa:Lambda Ratio: 0 — ABNORMAL LOW (ref 0.26–1.65)
LAMBDA FREE LGHT CHN: 48.7 mg/dL — AB (ref 0.57–2.63)

## 2014-02-17 LAB — IGG: IgG (Immunoglobin G), Serum: 2490 mg/dL — ABNORMAL HIGH (ref 650–1600)

## 2014-02-18 ENCOUNTER — Telehealth: Payer: Self-pay | Admitting: *Deleted

## 2014-02-18 MED ORDER — LENALIDOMIDE 10 MG PO CAPS: 10.0000 mg | ORAL_CAPSULE | Freq: Every day | ORAL | Status: DC

## 2014-02-18 NOTE — Telephone Encounter (Signed)
Called pt with lab results. MSpike, light chains and IgG are higher. Resume Revlimid at previous dose, per Dr. Benay Spice. Pt voiced understanding. Instructed him to call office when he receives Revlimid so we may update his start date. Per pt: Revlimid Rx will need to go to [facility].

## 2014-02-18 NOTE — Telephone Encounter (Signed)
-----   Message from Ladell Pier, MD sent at 02/17/2014  6:09 PM EST ----- Please call patient, mspike, light chains, and IgG are higher, resume revlimid at previous dose

## 2014-02-20 ENCOUNTER — Telehealth: Payer: Self-pay | Admitting: *Deleted

## 2014-02-20 ENCOUNTER — Other Ambulatory Visit: Payer: Self-pay | Admitting: *Deleted

## 2014-02-20 ENCOUNTER — Other Ambulatory Visit (HOSPITAL_BASED_OUTPATIENT_CLINIC_OR_DEPARTMENT_OTHER): Payer: BC Managed Care – PPO

## 2014-02-20 ENCOUNTER — Other Ambulatory Visit: Payer: Self-pay

## 2014-02-20 ENCOUNTER — Ambulatory Visit: Payer: BC Managed Care – PPO

## 2014-02-20 ENCOUNTER — Ambulatory Visit (HOSPITAL_BASED_OUTPATIENT_CLINIC_OR_DEPARTMENT_OTHER): Payer: BC Managed Care – PPO

## 2014-02-20 DIAGNOSIS — C9000 Multiple myeloma not having achieved remission: Secondary | ICD-10-CM

## 2014-02-20 DIAGNOSIS — Z5112 Encounter for antineoplastic immunotherapy: Secondary | ICD-10-CM

## 2014-02-20 DIAGNOSIS — Z95828 Presence of other vascular implants and grafts: Secondary | ICD-10-CM

## 2014-02-20 LAB — CBC WITH DIFFERENTIAL/PLATELET
BASO%: 0.3 % (ref 0.0–2.0)
BASOS ABS: 0 10*3/uL (ref 0.0–0.1)
EOS ABS: 0 10*3/uL (ref 0.0–0.5)
EOS%: 0.6 % (ref 0.0–7.0)
HEMATOCRIT: 35 % — AB (ref 38.4–49.9)
HEMOGLOBIN: 11.6 g/dL — AB (ref 13.0–17.1)
LYMPH%: 10.8 % — ABNORMAL LOW (ref 14.0–49.0)
MCH: 29.7 pg (ref 27.2–33.4)
MCHC: 33.1 g/dL (ref 32.0–36.0)
MCV: 89.7 fL (ref 79.3–98.0)
MONO#: 0.1 10*3/uL (ref 0.1–0.9)
MONO%: 1.6 % (ref 0.0–14.0)
NEUT#: 5.5 10*3/uL (ref 1.5–6.5)
NEUT%: 86.7 % — AB (ref 39.0–75.0)
Platelets: 224 10*3/uL (ref 140–400)
RBC: 3.9 10*6/uL — ABNORMAL LOW (ref 4.20–5.82)
RDW: 15.3 % — ABNORMAL HIGH (ref 11.0–14.6)
WBC: 6.4 10*3/uL (ref 4.0–10.3)
lymph#: 0.7 10*3/uL — ABNORMAL LOW (ref 0.9–3.3)

## 2014-02-20 MED ORDER — HEPARIN SOD (PORK) LOCK FLUSH 100 UNIT/ML IV SOLN
500.0000 [IU] | Freq: Once | INTRAVENOUS | Status: AC | PRN
  Administered 2014-02-20: 500 [IU]
  Filled 2014-02-20: qty 5

## 2014-02-20 MED ORDER — SODIUM CHLORIDE 0.9 % IJ SOLN
10.0000 mL | INTRAMUSCULAR | Status: DC | PRN
  Administered 2014-02-20: 10 mL
  Filled 2014-02-20: qty 10

## 2014-02-20 MED ORDER — DEXTROSE 5 % IV SOLN
27.0000 mg/m2 | Freq: Once | INTRAVENOUS | Status: AC
  Administered 2014-02-20: 60 mg via INTRAVENOUS
  Filled 2014-02-20: qty 30

## 2014-02-20 MED ORDER — SODIUM CHLORIDE 0.9 % IJ SOLN
10.0000 mL | INTRAMUSCULAR | Status: DC | PRN
  Filled 2014-02-20: qty 10

## 2014-02-20 MED ORDER — ONDANSETRON 8 MG/50ML IVPB (CHCC)
8.0000 mg | Freq: Once | INTRAVENOUS | Status: AC
  Administered 2014-02-20: 8 mg via INTRAVENOUS

## 2014-02-20 MED ORDER — ONDANSETRON 8 MG/NS 50 ML IVPB
INTRAVENOUS | Status: AC
  Filled 2014-02-20: qty 8

## 2014-02-20 MED ORDER — SODIUM CHLORIDE 0.9 % IV SOLN
Freq: Once | INTRAVENOUS | Status: AC
  Administered 2014-02-20: 16:00:00 via INTRAVENOUS

## 2014-02-20 NOTE — Patient Instructions (Signed)

## 2014-02-20 NOTE — Telephone Encounter (Signed)
Per staff message and POF I have scheduled appts. Advised scheduler of appts. JMW  

## 2014-02-20 NOTE — Patient Instructions (Signed)
[facility] Discharge Instructions for Patients Receiving Chemotherapy  Today you received the following chemotherapy agents kyprolis  To help prevent nausea and vomiting after your treatment, we encourage you to take your nausea medication as directed   If you develop nausea and vomiting that is not controlled by your nausea medication, call the clinic.   BELOW ARE SYMPTOMS THAT SHOULD BE REPORTED IMMEDIATELY:  *FEVER GREATER THAN 100.5 F  *CHILLS WITH OR WITHOUT FEVER  NAUSEA AND VOMITING THAT IS NOT CONTROLLED WITH YOUR NAUSEA MEDICATION  *UNUSUAL SHORTNESS OF BREATH  *UNUSUAL BRUISING OR BLEEDING  TENDERNESS IN MOUTH AND THROAT WITH OR WITHOUT PRESENCE OF ULCERS  *URINARY PROBLEMS  *BOWEL PROBLEMS  UNUSUAL RASH Items with * indicate a potential emergency and should be followed up as soon as possible.  Feel free to call the clinic you have any questions or concerns. The clinic phone number is [phone].  

## 2014-02-20 NOTE — Telephone Encounter (Signed)
Asking for status of Revlimid script. Confirmed with [facility] that it was received and in process. They should be calling him by tomorrow for delivery Saturday.

## 2014-02-21 ENCOUNTER — Telehealth: Payer: Self-pay | Admitting: Nurse Practitioner

## 2014-02-21 ENCOUNTER — Other Ambulatory Visit: Payer: Self-pay | Admitting: *Deleted

## 2014-02-21 ENCOUNTER — Ambulatory Visit (HOSPITAL_BASED_OUTPATIENT_CLINIC_OR_DEPARTMENT_OTHER): Payer: BC Managed Care – PPO

## 2014-02-21 DIAGNOSIS — C9000 Multiple myeloma not having achieved remission: Secondary | ICD-10-CM

## 2014-02-21 DIAGNOSIS — Z5112 Encounter for antineoplastic immunotherapy: Secondary | ICD-10-CM

## 2014-02-21 MED ORDER — ONDANSETRON 8 MG/50ML IVPB (CHCC)
8.0000 mg | Freq: Once | INTRAVENOUS | Status: AC
  Administered 2014-02-21: 8 mg via INTRAVENOUS

## 2014-02-21 MED ORDER — CARFILZOMIB CHEMO INJECTION 60 MG
27.0000 mg/m2 | Freq: Once | INTRAVENOUS | Status: AC
  Administered 2014-02-21: 60 mg via INTRAVENOUS
  Filled 2014-02-21: qty 30

## 2014-02-21 MED ORDER — HEPARIN SOD (PORK) LOCK FLUSH 100 UNIT/ML IV SOLN
500.0000 [IU] | Freq: Once | INTRAVENOUS | Status: AC | PRN
  Administered 2014-02-21: 500 [IU]
  Filled 2014-02-21: qty 5

## 2014-02-21 MED ORDER — SODIUM CHLORIDE 0.9 % IJ SOLN
10.0000 mL | INTRAMUSCULAR | Status: DC | PRN
  Administered 2014-02-21: 10 mL
  Filled 2014-02-21: qty 10

## 2014-02-21 MED ORDER — SODIUM CHLORIDE 0.9 % IV SOLN
Freq: Once | INTRAVENOUS | Status: AC
  Administered 2014-02-21: 16:00:00 via INTRAVENOUS

## 2014-02-21 MED ORDER — ONDANSETRON 8 MG/NS 50 ML IVPB
INTRAVENOUS | Status: AC
  Filled 2014-02-21: qty 8

## 2014-02-21 NOTE — Telephone Encounter (Signed)
Gave updated cal to match care plan.

## 2014-02-21 NOTE — Progress Notes (Signed)
Notified by pharmacy that chemo was scheduled for 12/14 & 12/15--? Too early  POF sent to scheduler to make correction that chemo is on thurs/friday.

## 2014-02-21 NOTE — Patient Instructions (Signed)
[facility] Discharge Inst[facility] for Patients Receiving Chemotherapy  Today you received the following chemotherapy agents kyprolis  To help prevent nausea and vomiting after your treatment, we encourage you to take your nausea medication as directed   If you develop nausea and vomiting that is not controlled by your nausea medication, call the clinic.   BELOW ARE SYMPTOMS THAT SHOULD BE REPORTED IMMEDIATELY:  *FEVER GREATER THAN 100.5 F  *CHILLS WITH OR WITHOUT FEVER  NAUSEA AND VOMITING THAT IS NOT CONTROLLED WITH YOUR NAUSEA MEDICATION  *UNUSUAL SHORTNESS OF BREATH  *UNUSUAL BRUISING OR BLEEDING  TENDERNESS IN MOUTH AND THROAT WITH OR WITHOUT PRESENCE OF ULCERS  *URINARY PROBLEMS  *BOWEL PROBLEMS  UNUSUAL RASH Items with * indicate a potential emergency and should be followed up as soon as possible.  Feel free to call the clinic you have any questions or concerns. The clinic phone number is [phone].  

## 2014-02-24 ENCOUNTER — Ambulatory Visit: Payer: BC Managed Care – PPO

## 2014-02-25 ENCOUNTER — Ambulatory Visit: Payer: BC Managed Care – PPO

## 2014-02-27 ENCOUNTER — Other Ambulatory Visit: Payer: Self-pay | Admitting: *Deleted

## 2014-02-27 ENCOUNTER — Other Ambulatory Visit: Payer: BC Managed Care – PPO

## 2014-02-27 ENCOUNTER — Ambulatory Visit (HOSPITAL_BASED_OUTPATIENT_CLINIC_OR_DEPARTMENT_OTHER): Payer: BC Managed Care – PPO

## 2014-02-27 ENCOUNTER — Ambulatory Visit: Payer: BC Managed Care – PPO

## 2014-02-27 ENCOUNTER — Other Ambulatory Visit (HOSPITAL_BASED_OUTPATIENT_CLINIC_OR_DEPARTMENT_OTHER): Payer: BC Managed Care – PPO

## 2014-02-27 DIAGNOSIS — C9000 Multiple myeloma not having achieved remission: Secondary | ICD-10-CM

## 2014-02-27 DIAGNOSIS — N08 Glomerular disorders in diseases classified elsewhere: Principal | ICD-10-CM

## 2014-02-27 DIAGNOSIS — Z95828 Presence of other vascular implants and grafts: Secondary | ICD-10-CM

## 2014-02-27 DIAGNOSIS — Z5112 Encounter for antineoplastic immunotherapy: Secondary | ICD-10-CM

## 2014-02-27 LAB — CBC WITH DIFFERENTIAL/PLATELET
BASO%: 0.2 % (ref 0.0–2.0)
Basophils Absolute: 0 10*3/uL (ref 0.0–0.1)
EOS ABS: 0 10*3/uL (ref 0.0–0.5)
EOS%: 0.2 % (ref 0.0–7.0)
HCT: 35.3 % — ABNORMAL LOW (ref 38.4–49.9)
HEMOGLOBIN: 11.7 g/dL — AB (ref 13.0–17.1)
LYMPH%: 8.8 % — ABNORMAL LOW (ref 14.0–49.0)
MCH: 29.6 pg (ref 27.2–33.4)
MCHC: 33.1 g/dL (ref 32.0–36.0)
MCV: 89.4 fL (ref 79.3–98.0)
MONO#: 0.1 10*3/uL (ref 0.1–0.9)
MONO%: 1 % (ref 0.0–14.0)
NEUT%: 89.8 % — ABNORMAL HIGH (ref 39.0–75.0)
NEUTROS ABS: 8.2 10*3/uL — AB (ref 1.5–6.5)
Platelets: 292 10*3/uL (ref 140–400)
RBC: 3.95 10*6/uL — ABNORMAL LOW (ref 4.20–5.82)
RDW: 15.2 % — AB (ref 11.0–14.6)
WBC: 9.1 10*3/uL (ref 4.0–10.3)
lymph#: 0.8 10*3/uL — ABNORMAL LOW (ref 0.9–3.3)

## 2014-02-27 MED ORDER — FENTANYL 25 MCG/HR TD PT72: 25.0000 ug | MEDICATED_PATCH | TRANSDERMAL | Status: DC

## 2014-02-27 MED ORDER — ONDANSETRON 8 MG/NS 50 ML IVPB
INTRAVENOUS | Status: AC
  Filled 2014-02-27: qty 8

## 2014-02-27 MED ORDER — SODIUM CHLORIDE 0.9 % IV SOLN
Freq: Once | INTRAVENOUS | Status: AC
  Administered 2014-02-27: 16:00:00 via INTRAVENOUS

## 2014-02-27 MED ORDER — SODIUM CHLORIDE 0.9 % IJ SOLN
10.0000 mL | INTRAMUSCULAR | Status: DC | PRN
  Administered 2014-02-27: 10 mL
  Filled 2014-02-27: qty 10

## 2014-02-27 MED ORDER — SODIUM CHLORIDE 0.9 % IJ SOLN
10.0000 mL | INTRAMUSCULAR | Status: DC | PRN
  Administered 2014-02-27: 10 mL via INTRAVENOUS
  Filled 2014-02-27: qty 10

## 2014-02-27 MED ORDER — HEPARIN SOD (PORK) LOCK FLUSH 100 UNIT/ML IV SOLN
500.0000 [IU] | Freq: Once | INTRAVENOUS | Status: AC | PRN
  Administered 2014-02-27: 500 [IU]
  Filled 2014-02-27: qty 5

## 2014-02-27 MED ORDER — DEXTROSE 5 % IV SOLN
27.0000 mg/m2 | Freq: Once | INTRAVENOUS | Status: AC
  Administered 2014-02-27: 60 mg via INTRAVENOUS
  Filled 2014-02-27: qty 30

## 2014-02-27 MED ORDER — ONDANSETRON 8 MG/50ML IVPB (CHCC)
8.0000 mg | Freq: Once | INTRAVENOUS | Status: AC
  Administered 2014-02-27: 8 mg via INTRAVENOUS

## 2014-02-27 NOTE — Progress Notes (Signed)
Patient states he took 40mg  of decadron at home.

## 2014-02-27 NOTE — Patient Instructions (Signed)

## 2014-02-27 NOTE — Patient Instructions (Signed)
[facility] Discharge Instructions for Patients Receiving Chemotherapy  Today you received the following chemotherapy agents kyprolis  To help prevent nausea and vomiting after your treatment, we encourage you to take your nausea medication as directed   If you develop nausea and vomiting that is not controlled by your nausea medication, call the clinic.   BELOW ARE SYMPTOMS THAT SHOULD BE REPORTED IMMEDIATELY:  *FEVER GREATER THAN 100.5 F  *CHILLS WITH OR WITHOUT FEVER  NAUSEA AND VOMITING THAT IS NOT CONTROLLED WITH YOUR NAUSEA MEDICATION  *UNUSUAL SHORTNESS OF BREATH  *UNUSUAL BRUISING OR BLEEDING  TENDERNESS IN MOUTH AND THROAT WITH OR WITHOUT PRESENCE OF ULCERS  *URINARY PROBLEMS  *BOWEL PROBLEMS  UNUSUAL RASH Items with * indicate a potential emergency and should be followed up as soon as possible.  Feel free to call the clinic you have any questions or concerns. The clinic phone number is [phone].  

## 2014-02-28 ENCOUNTER — Ambulatory Visit (HOSPITAL_BASED_OUTPATIENT_CLINIC_OR_DEPARTMENT_OTHER): Payer: BC Managed Care – PPO

## 2014-02-28 ENCOUNTER — Other Ambulatory Visit: Payer: Self-pay | Admitting: Oncology

## 2014-02-28 DIAGNOSIS — C9000 Multiple myeloma not having achieved remission: Secondary | ICD-10-CM

## 2014-02-28 DIAGNOSIS — Z452 Encounter for adjustment and management of vascular access device: Secondary | ICD-10-CM

## 2014-02-28 DIAGNOSIS — Z5112 Encounter for antineoplastic immunotherapy: Secondary | ICD-10-CM

## 2014-02-28 MED ORDER — ONDANSETRON 8 MG/50ML IVPB (CHCC)
8.0000 mg | Freq: Once | INTRAVENOUS | Status: AC
  Administered 2014-02-28: 8 mg via INTRAVENOUS

## 2014-02-28 MED ORDER — ALTEPLASE 2 MG IJ SOLR
2.0000 mg | Freq: Once | INTRAMUSCULAR | Status: AC | PRN
  Administered 2014-02-28: 2 mg
  Filled 2014-02-28: qty 2

## 2014-02-28 MED ORDER — ONDANSETRON 8 MG/NS 50 ML IVPB
INTRAVENOUS | Status: AC
  Filled 2014-02-28: qty 8

## 2014-02-28 MED ORDER — SODIUM CHLORIDE 0.9 % IJ SOLN
10.0000 mL | INTRAMUSCULAR | Status: DC | PRN
Start: 2014-02-28 — End: 2014-02-28
  Administered 2014-02-28: 10 mL
  Filled 2014-02-28: qty 10

## 2014-02-28 MED ORDER — SODIUM CHLORIDE 0.9 % IV SOLN
Freq: Once | INTRAVENOUS | Status: AC
  Administered 2014-02-28: 16:00:00 via INTRAVENOUS

## 2014-02-28 MED ORDER — DEXTROSE 5 % IV SOLN
27.0000 mg/m2 | Freq: Once | INTRAVENOUS | Status: AC
  Administered 2014-02-28: 60 mg via INTRAVENOUS
  Filled 2014-02-28: qty 30

## 2014-02-28 MED ORDER — HEPARIN SOD (PORK) LOCK FLUSH 100 UNIT/ML IV SOLN
500.0000 [IU] | Freq: Once | INTRAVENOUS | Status: AC | PRN
  Administered 2014-02-28: 500 [IU]
  Filled 2014-02-28: qty 5

## 2014-02-28 NOTE — Patient Instructions (Signed)
[facility] Discharge Instructions for Patients Receiving Chemotherapy  Today you received the following chemotherapy agents kyprolis  To help prevent nausea and vomiting after your treatment, we encourage you to take your nausea medication if needed.   If you develop nausea and vomiting that is not controlled by your nausea medication, call the clinic.   BELOW ARE SYMPTOMS THAT SHOULD BE REPORTED IMMEDIATELY:  *FEVER GREATER THAN 100.5 F  *CHILLS WITH OR WITHOUT FEVER  NAUSEA AND VOMITING THAT IS NOT CONTROLLED WITH YOUR NAUSEA MEDICATION  *UNUSUAL SHORTNESS OF BREATH  *UNUSUAL BRUISING OR BLEEDING  TENDERNESS IN MOUTH AND THROAT WITH OR WITHOUT PRESENCE OF ULCERS  *URINARY PROBLEMS  *BOWEL PROBLEMS  UNUSUAL RASH Items with * indicate a potential emergency and should be followed up as soon as possible.  Feel free to call the clinic you have any questions or concerns. The clinic phone number is [phone].

## 2014-03-03 ENCOUNTER — Other Ambulatory Visit: Payer: Self-pay | Admitting: *Deleted

## 2014-03-03 DIAGNOSIS — C9000 Multiple myeloma not having achieved remission: Secondary | ICD-10-CM

## 2014-03-03 MED ORDER — PREGABALIN 50 MG PO CAPS: 50.0000 mg | ORAL_CAPSULE | Freq: Two times a day (BID) | ORAL | Status: DC

## 2014-03-03 NOTE — Telephone Encounter (Signed)
Refill on 02/28/14 was printed in error. Destroyed paper copy and called in script.

## 2014-03-16 ENCOUNTER — Other Ambulatory Visit: Payer: Self-pay | Admitting: Oncology

## 2014-03-20 ENCOUNTER — Ambulatory Visit (HOSPITAL_BASED_OUTPATIENT_CLINIC_OR_DEPARTMENT_OTHER): Payer: BLUE CROSS/BLUE SHIELD | Admitting: Nurse Practitioner

## 2014-03-20 ENCOUNTER — Other Ambulatory Visit (HOSPITAL_BASED_OUTPATIENT_CLINIC_OR_DEPARTMENT_OTHER): Payer: BLUE CROSS/BLUE SHIELD

## 2014-03-20 ENCOUNTER — Other Ambulatory Visit: Payer: Self-pay | Admitting: *Deleted

## 2014-03-20 ENCOUNTER — Ambulatory Visit (HOSPITAL_BASED_OUTPATIENT_CLINIC_OR_DEPARTMENT_OTHER): Payer: BLUE CROSS/BLUE SHIELD

## 2014-03-20 ENCOUNTER — Ambulatory Visit: Payer: BLUE CROSS/BLUE SHIELD

## 2014-03-20 ENCOUNTER — Telehealth: Payer: Self-pay | Admitting: Nurse Practitioner

## 2014-03-20 VITALS — BP 150/62 | HR 104 | Temp 98.0°F | Resp 18 | Wt 225.0 lb

## 2014-03-20 DIAGNOSIS — C9000 Multiple myeloma not having achieved remission: Secondary | ICD-10-CM

## 2014-03-20 DIAGNOSIS — G620 Drug-induced polyneuropathy: Secondary | ICD-10-CM

## 2014-03-20 DIAGNOSIS — Z5112 Encounter for antineoplastic immunotherapy: Secondary | ICD-10-CM

## 2014-03-20 DIAGNOSIS — Z452 Encounter for adjustment and management of vascular access device: Secondary | ICD-10-CM

## 2014-03-20 LAB — CBC WITH DIFFERENTIAL/PLATELET
BASO%: 0.8 % (ref 0.0–2.0)
Basophils Absolute: 0 10*3/uL (ref 0.0–0.1)
EOS%: 0.7 % (ref 0.0–7.0)
Eosinophils Absolute: 0 10*3/uL (ref 0.0–0.5)
HEMATOCRIT: 35.8 % — AB (ref 38.4–49.9)
HEMOGLOBIN: 11.7 g/dL — AB (ref 13.0–17.1)
LYMPH#: 0.8 10*3/uL — AB (ref 0.9–3.3)
LYMPH%: 13.2 % — ABNORMAL LOW (ref 14.0–49.0)
MCH: 29.3 pg (ref 27.2–33.4)
MCHC: 32.7 g/dL (ref 32.0–36.0)
MCV: 89.7 fL (ref 79.3–98.0)
MONO#: 0.1 10*3/uL (ref 0.1–0.9)
MONO%: 1.3 % (ref 0.0–14.0)
NEUT%: 84 % — ABNORMAL HIGH (ref 39.0–75.0)
NEUTROS ABS: 4.9 10*3/uL (ref 1.5–6.5)
Platelets: 234 10*3/uL (ref 140–400)
RBC: 3.99 10*6/uL — ABNORMAL LOW (ref 4.20–5.82)
RDW: 16.7 % — AB (ref 11.0–14.6)
WBC: 5.8 10*3/uL (ref 4.0–10.3)

## 2014-03-20 LAB — COMPREHENSIVE METABOLIC PANEL (CC13)
ALK PHOS: 55 U/L (ref 40–150)
ALT: 42 U/L (ref 0–55)
ANION GAP: 10 meq/L (ref 3–11)
AST: 25 U/L (ref 5–34)
Albumin: 3.8 g/dL (ref 3.5–5.0)
BILIRUBIN TOTAL: 0.71 mg/dL (ref 0.20–1.20)
BUN: 14.9 mg/dL (ref 7.0–26.0)
CHLORIDE: 104 meq/L (ref 98–109)
CO2: 25 meq/L (ref 22–29)
CREATININE: 1.2 mg/dL (ref 0.7–1.3)
Calcium: 9.5 mg/dL (ref 8.4–10.4)
EGFR: 68 mL/min/{1.73_m2} — AB (ref >90)
GLUCOSE: 180 mg/dL — AB (ref 70–140)
Potassium: 4.4 mEq/L (ref 3.5–5.1)
Sodium: 139 mEq/L (ref 136–145)
Total Protein: 8.8 g/dL — ABNORMAL HIGH (ref 6.4–8.3)

## 2014-03-20 MED ORDER — SODIUM CHLORIDE 0.9 % IV SOLN
Freq: Once | INTRAVENOUS | Status: AC
  Administered 2014-03-20: 16:00:00 via INTRAVENOUS

## 2014-03-20 MED ORDER — ONDANSETRON 8 MG/NS 50 ML IVPB
INTRAVENOUS | Status: AC
  Filled 2014-03-20: qty 8

## 2014-03-20 MED ORDER — LENALIDOMIDE 10 MG PO CAPS: 10.0000 mg | ORAL_CAPSULE | Freq: Every day | ORAL | Status: DC

## 2014-03-20 MED ORDER — ONDANSETRON 8 MG/50ML IVPB (CHCC)
8.0000 mg | Freq: Once | INTRAVENOUS | Status: AC
  Administered 2014-03-20: 8 mg via INTRAVENOUS

## 2014-03-20 MED ORDER — ONDANSETRON HCL 8 MG PO TABS
ORAL_TABLET | ORAL | Status: AC
  Filled 2014-03-20: qty 1

## 2014-03-20 MED ORDER — CARFILZOMIB CHEMO INJECTION 60 MG
27.0000 mg/m2 | Freq: Once | INTRAVENOUS | Status: AC
  Administered 2014-03-20: 60 mg via INTRAVENOUS
  Filled 2014-03-20: qty 30

## 2014-03-20 MED ORDER — ALTEPLASE 2 MG IJ SOLR
2.0000 mg | Freq: Once | INTRAMUSCULAR | Status: AC | PRN
  Administered 2014-03-20: 2 mg
  Filled 2014-03-20: qty 2

## 2014-03-20 MED ORDER — SODIUM CHLORIDE 0.9 % IJ SOLN
10.0000 mL | INTRAMUSCULAR | Status: DC | PRN
  Administered 2014-03-20: 10 mL
  Filled 2014-03-20: qty 10

## 2014-03-20 MED ORDER — HEPARIN SOD (PORK) LOCK FLUSH 100 UNIT/ML IV SOLN
500.0000 [IU] | Freq: Once | INTRAVENOUS | Status: AC | PRN
  Administered 2014-03-20: 500 [IU]
  Filled 2014-03-20: qty 5

## 2014-03-20 NOTE — Telephone Encounter (Signed)
Received illegible refill request from [facility]. Rx for Revlimid sent electronically.

## 2014-03-20 NOTE — Telephone Encounter (Signed)
Gave avs & cal for Jan/Feb. Sent mess to sch tx. °

## 2014-03-20 NOTE — Progress Notes (Unsigned)
Rn accessed medial and lateral port.  Medial port gave enough blood return for one vial of blood.  Lateral port gave no blood return.  Rn flushed and de accessed medial port.  RN left lateral port accessed and instilled cathflo

## 2014-03-20 NOTE — Patient Instructions (Signed)
[facility] Discharge Instructions for Patients Receiving Chemotherapy  Today you received the following chemotherapy agents Kyprolis.  To help prevent nausea and vomiting after your treatment, we encourage you to take your nausea medication.   If you develop nausea and vomiting that is not controlled by your nausea medication, call the clinic.   BELOW ARE SYMPTOMS THAT SHOULD BE REPORTED IMMEDIATELY:  *FEVER GREATER THAN 100.5 F  *CHILLS WITH OR WITHOUT FEVER  NAUSEA AND VOMITING THAT IS NOT CONTROLLED WITH YOUR NAUSEA MEDICATION  *UNUSUAL SHORTNESS OF BREATH  *UNUSUAL BRUISING OR BLEEDING  TENDERNESS IN MOUTH AND THROAT WITH OR WITHOUT PRESENCE OF ULCERS  *URINARY PROBLEMS  *BOWEL PROBLEMS  UNUSUAL RASH Items with * indicate a potential emergency and should be followed up as soon as possible.  Feel free to call the clinic you have any questions or concerns. The clinic phone number is [phone].    

## 2014-03-20 NOTE — Progress Notes (Signed)
  [place] OFFICE PROGRESS NOTE   Diagnosis:   Multiple myeloma  INTERVAL HISTORY:   Wal[facility] Dennis returns as scheduled. He continues weekly Decadron and Carfilzomib. He resumed Revlimid  approximately 2 weeks ago. He denies nausea/vomiting. No mouth sores. He has intermittent loose stools. He has persistent painful neuropathy in the legs and feet. He denies fever. No shortness of breath or chest pain.  Objective:  Vital signs in last 24 hours:  Blood pressure 150/62, pulse 104, temperature 98 F (36.7 C), temperature source Oral, resp. rate 18, weight 225 lb (102.059 kg), SpO2 98 %.    HEENT:  No thrush or ulcers. Resp:  Lungs clear bilaterally. Cardio:  Regular rate and rhythm. GI:  Abdomen soft and nontender. No organomegaly. Vascular:  No leg edema. Port-A-Cath without erythema.  Lab Results:  Lab Results  Component Value Date   WBC 5.8 03/20/2014   HGB 11.7* 03/20/2014   HCT 35.8* 03/20/2014   MCV 89.7 03/20/2014   PLT 234 03/20/2014   NEUTROABS 4.9 03/20/2014    Imaging:  No results found.  Medications: I have reviewed the patient's current medications.  Assessment/Plan: 1.Multiple myeloma, IgG lambda monoclonal protein  -Initial diagnosis 2008, bone marrow with a 40-50% plasma cells  -Lenalidomide 25 mg,day 1-21 and Decadron 40 mg weekly and 1610, complicated by peripheral neuropathy  -Therapy change to bortezomib IV twice weekly plus Decadron  -High-dose chemotherapy with autologous stem cell transplantation 2009 at [facility]  -Bortezomib, cyclophosphamide, and Decadron December 2012 through February 2014 (discontinued secondary to pulmonary aspergillosis)  -January 2014 M spike rise to 1.1 g/dL from 0.1 g/dL in December 2013. Treatment started with Carlfilzomib, lenalidomide, and Decadron.  -treatment was restarted with Carlfilzomib, lenalidomide, and Decadron 08/16/2012.  -Restaging labs on 10/15/2012 consistent with improvement in the  serum M. Protein.  -Continuation of Carfilzomib/Revlimid/dexamethasone.  -Continued improvement in the serum M spike and IgG level on 11/29/2012.  -Improvement in the serum M spike, slight increase in IgG on 01/03/2013.  -Improvement in the serum M spike, stable IgG, stable lambda free light chains 01/31/2013  -The IgG and serum M spike were slightly higher 02/28/2013, stable lambda light chains  -IgG, M spike, and lambda light chains slightly lower on 04/04/2013.  -Continuation of Carfilzomib/Revlimid/dexamethasone.  -IgG, M spike and lambda light chains stable 05/02/2013.  -IgG, serum M spike, and lambda free light chains stable on 07/11/2013; 08/08/2013.  -IgG, serum M spike, and lambda light chains higher 10/17/2013 after being off treatment during July 2015  - Carfilzomib/decadron continued with a cycle starting 10/17/2013  -Increase in serum M spike, light chains and IgG 02/13/2014. - Carfilzomib/ Decadron continued, Revlimid resumed. 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 He has been unable to lower the Duragesic patch dose  4. Irritable bowel syndrome  5. Hypertension  6. "Chest pain "and dyspnea with exertion ,evaluated by cardiology, . Normal stress nuclear study 11/07/2012 and 08/07/2013.  7. history of Diarrhea. Most likely related to Revlimid  8. Febrile illness 12/05/2013 with associated diffuse bone pain-no source for infection identified    Disposition:  Mr. Mierzwa appears stable. Plan to continue Carfilzomib/Decadron and Revlimid. We will follow-up on the myeloma labs from today. He will return for a follow-up visit in 4 weeks.    Ned Card ANP/GNP-BC   03/20/2014  4:01 PM

## 2014-03-21 ENCOUNTER — Other Ambulatory Visit: Payer: Self-pay | Admitting: *Deleted

## 2014-03-21 ENCOUNTER — Telehealth: Payer: Self-pay | Admitting: *Deleted

## 2014-03-21 ENCOUNTER — Ambulatory Visit (HOSPITAL_BASED_OUTPATIENT_CLINIC_OR_DEPARTMENT_OTHER): Payer: BLUE CROSS/BLUE SHIELD

## 2014-03-21 DIAGNOSIS — C9000 Multiple myeloma not having achieved remission: Secondary | ICD-10-CM

## 2014-03-21 DIAGNOSIS — Z5112 Encounter for antineoplastic immunotherapy: Secondary | ICD-10-CM

## 2014-03-21 MED ORDER — SODIUM CHLORIDE 0.9 % IJ SOLN
10.0000 mL | INTRAMUSCULAR | Status: DC | PRN
  Administered 2014-03-21: 10 mL
  Filled 2014-03-21: qty 10

## 2014-03-21 MED ORDER — DEXTROSE 5 % IV SOLN
27.0000 mg/m2 | Freq: Once | INTRAVENOUS | Status: AC
  Administered 2014-03-21: 60 mg via INTRAVENOUS
  Filled 2014-03-21: qty 30

## 2014-03-21 MED ORDER — ONDANSETRON 8 MG/NS 50 ML IVPB
INTRAVENOUS | Status: AC
  Filled 2014-03-21: qty 8

## 2014-03-21 MED ORDER — SODIUM CHLORIDE 0.9 % IV SOLN
Freq: Once | INTRAVENOUS | Status: AC
  Administered 2014-03-21: 16:00:00 via INTRAVENOUS

## 2014-03-21 MED ORDER — ONDANSETRON 8 MG/50ML IVPB (CHCC)
8.0000 mg | Freq: Once | INTRAVENOUS | Status: AC
  Administered 2014-03-21: 8 mg via INTRAVENOUS

## 2014-03-21 MED ORDER — HEPARIN SOD (PORK) LOCK FLUSH 100 UNIT/ML IV SOLN
500.0000 [IU] | Freq: Once | INTRAVENOUS | Status: AC | PRN
  Administered 2014-03-21: 500 [IU]
  Filled 2014-03-21: qty 5

## 2014-03-21 NOTE — Telephone Encounter (Signed)
THIS REFILL REQUEST FOR REVLIMID WAS PLACED ON DR.SHERRILL'S DESK.

## 2014-03-21 NOTE — Telephone Encounter (Signed)
Per staff message and POF I have scheduled appts. Advised scheduler of appts. JMW  

## 2014-03-21 NOTE — Patient Instructions (Signed)
[facility] Discharge Instructions for Patients Receiving Chemotherapy  Today you received the following chemotherapy agents Kyprolis.  To help prevent nausea and vomiting after your treatment, we encourage you to take your nausea medication.   If you develop nausea and vomiting that is not controlled by your nausea medication, call the clinic.   BELOW ARE SYMPTOMS THAT SHOULD BE REPORTED IMMEDIATELY:  *FEVER GREATER THAN 100.5 F  *CHILLS WITH OR WITHOUT FEVER  NAUSEA AND VOMITING THAT IS NOT CONTROLLED WITH YOUR NAUSEA MEDICATION  *UNUSUAL SHORTNESS OF BREATH  *UNUSUAL BRUISING OR BLEEDING  TENDERNESS IN MOUTH AND THROAT WITH OR WITHOUT PRESENCE OF ULCERS  *URINARY PROBLEMS  *BOWEL PROBLEMS  UNUSUAL RASH Items with * indicate a potential emergency and should be followed up as soon as possible.  Feel free to call the clinic you have any questions or concerns. The clinic phone number is [phone].    

## 2014-03-24 LAB — PROTEIN ELECTROPHORESIS, SERUM
ALPHA-2-GLOBULIN: 7 % — AB (ref 7.1–11.8)
Albumin ELP: 48 % — ABNORMAL LOW (ref 55.8–66.1)
Alpha-1-Globulin: 3.4 % (ref 2.9–4.9)
BETA 2: 35.2 % — AB (ref 3.2–6.5)
Beta Globulin: 4.9 % (ref 4.7–7.2)
Gamma Globulin: 1.5 % — ABNORMAL LOW (ref 11.1–18.8)
M-SPIKE, %: 2.46 g/dL
Total Protein, Serum Electrophoresis: 8.5 g/dL — ABNORMAL HIGH (ref 6.0–8.3)

## 2014-03-24 LAB — KAPPA/LAMBDA LIGHT CHAINS
Kappa free light chain: 0.16 mg/dL — ABNORMAL LOW (ref 0.33–1.94)
Kappa:Lambda Ratio: 0 — ABNORMAL LOW (ref 0.26–1.65)
LAMBDA FREE LGHT CHN: 46.5 mg/dL — AB (ref 0.57–2.63)

## 2014-03-26 ENCOUNTER — Other Ambulatory Visit: Payer: Self-pay | Admitting: *Deleted

## 2014-03-26 DIAGNOSIS — C9000 Multiple myeloma not having achieved remission: Secondary | ICD-10-CM

## 2014-03-27 ENCOUNTER — Ambulatory Visit: Payer: BLUE CROSS/BLUE SHIELD

## 2014-03-27 ENCOUNTER — Ambulatory Visit (HOSPITAL_BASED_OUTPATIENT_CLINIC_OR_DEPARTMENT_OTHER): Payer: BLUE CROSS/BLUE SHIELD

## 2014-03-27 ENCOUNTER — Other Ambulatory Visit (HOSPITAL_BASED_OUTPATIENT_CLINIC_OR_DEPARTMENT_OTHER): Payer: BLUE CROSS/BLUE SHIELD

## 2014-03-27 DIAGNOSIS — Z95828 Presence of other vascular implants and grafts: Secondary | ICD-10-CM

## 2014-03-27 DIAGNOSIS — Z5112 Encounter for antineoplastic immunotherapy: Secondary | ICD-10-CM

## 2014-03-27 DIAGNOSIS — C9000 Multiple myeloma not having achieved remission: Secondary | ICD-10-CM

## 2014-03-27 LAB — CBC WITH DIFFERENTIAL/PLATELET
BASO%: 0.2 % (ref 0.0–2.0)
Basophils Absolute: 0 10*3/uL (ref 0.0–0.1)
EOS%: 1.2 % (ref 0.0–7.0)
Eosinophils Absolute: 0.1 10*3/uL (ref 0.0–0.5)
HEMATOCRIT: 33.5 % — AB (ref 38.4–49.9)
HGB: 11.2 g/dL — ABNORMAL LOW (ref 13.0–17.1)
LYMPH%: 11 % — ABNORMAL LOW (ref 14.0–49.0)
MCH: 29.7 pg (ref 27.2–33.4)
MCHC: 33.4 g/dL (ref 32.0–36.0)
MCV: 88.9 fL (ref 79.3–98.0)
MONO#: 0.1 10*3/uL (ref 0.1–0.9)
MONO%: 1.7 % (ref 0.0–14.0)
NEUT#: 4.5 10*3/uL (ref 1.5–6.5)
NEUT%: 85.9 % — ABNORMAL HIGH (ref 39.0–75.0)
Platelets: 181 10*3/uL (ref 140–400)
RBC: 3.77 10*6/uL — AB (ref 4.20–5.82)
RDW: 15.4 % — ABNORMAL HIGH (ref 11.0–14.6)
WBC: 5.2 10*3/uL (ref 4.0–10.3)
lymph#: 0.6 10*3/uL — ABNORMAL LOW (ref 0.9–3.3)

## 2014-03-27 LAB — TECHNOLOGIST REVIEW

## 2014-03-27 MED ORDER — ONDANSETRON 8 MG/50ML IVPB (CHCC)
8.0000 mg | Freq: Once | INTRAVENOUS | Status: AC
  Administered 2014-03-27: 8 mg via INTRAVENOUS

## 2014-03-27 MED ORDER — SODIUM CHLORIDE 0.9 % IJ SOLN
10.0000 mL | INTRAMUSCULAR | Status: DC | PRN
  Administered 2014-03-27: 10 mL
  Filled 2014-03-27: qty 10

## 2014-03-27 MED ORDER — SODIUM CHLORIDE 0.9 % IV SOLN
Freq: Once | INTRAVENOUS | Status: AC
  Administered 2014-03-27: 16:00:00 via INTRAVENOUS

## 2014-03-27 MED ORDER — SODIUM CHLORIDE 0.9 % IJ SOLN
10.0000 mL | INTRAMUSCULAR | Status: DC | PRN
  Administered 2014-03-27: 10 mL via INTRAVENOUS
  Filled 2014-03-27: qty 10

## 2014-03-27 MED ORDER — HEPARIN SOD (PORK) LOCK FLUSH 100 UNIT/ML IV SOLN
500.0000 [IU] | Freq: Once | INTRAVENOUS | Status: AC | PRN
  Administered 2014-03-27: 500 [IU]
  Filled 2014-03-27: qty 5

## 2014-03-27 MED ORDER — DEXTROSE 5 % IV SOLN
27.0000 mg/m2 | Freq: Once | INTRAVENOUS | Status: AC
  Administered 2014-03-27: 60 mg via INTRAVENOUS
  Filled 2014-03-27: qty 30

## 2014-03-27 MED ORDER — ONDANSETRON 8 MG/NS 50 ML IVPB
INTRAVENOUS | Status: AC
  Filled 2014-03-27: qty 8

## 2014-03-27 NOTE — Patient Instructions (Signed)

## 2014-03-27 NOTE — Patient Instructions (Signed)
[facility] Discharge Instructions for Patients Receiving Chemotherapy  Today you received the following chemotherapy agents Kyprolis.  To help prevent nausea and vomiting after your treatment, we encourage you to take your nausea medication.   If you develop nausea and vomiting that is not controlled by your nausea medication, call the clinic.   BELOW ARE SYMPTOMS THAT SHOULD BE REPORTED IMMEDIATELY:  *FEVER GREATER THAN 100.5 F  *CHILLS WITH OR WITHOUT FEVER  NAUSEA AND VOMITING THAT IS NOT CONTROLLED WITH YOUR NAUSEA MEDICATION  *UNUSUAL SHORTNESS OF BREATH  *UNUSUAL BRUISING OR BLEEDING  TENDERNESS IN MOUTH AND THROAT WITH OR WITHOUT PRESENCE OF ULCERS  *URINARY PROBLEMS  *BOWEL PROBLEMS  UNUSUAL RASH Items with * indicate a potential emergency and should be followed up as soon as possible.  Feel free to call the clinic you have any questions or concerns. The clinic phone number is [phone].    

## 2014-03-28 ENCOUNTER — Other Ambulatory Visit: Payer: Self-pay | Admitting: *Deleted

## 2014-03-28 ENCOUNTER — Ambulatory Visit (HOSPITAL_BASED_OUTPATIENT_CLINIC_OR_DEPARTMENT_OTHER): Payer: BLUE CROSS/BLUE SHIELD

## 2014-03-28 DIAGNOSIS — N08 Glomerular disorders in diseases classified elsewhere: Principal | ICD-10-CM

## 2014-03-28 DIAGNOSIS — C9000 Multiple myeloma not having achieved remission: Secondary | ICD-10-CM

## 2014-03-28 DIAGNOSIS — Z5112 Encounter for antineoplastic immunotherapy: Secondary | ICD-10-CM

## 2014-03-28 MED ORDER — ONDANSETRON 8 MG/NS 50 ML IVPB
INTRAVENOUS | Status: AC
  Filled 2014-03-28: qty 8

## 2014-03-28 MED ORDER — FENTANYL 25 MCG/HR TD PT72: 25.0000 ug | MEDICATED_PATCH | TRANSDERMAL | Status: DC

## 2014-03-28 MED ORDER — DEXTROSE 5 % IV SOLN
27.0000 mg/m2 | Freq: Once | INTRAVENOUS | Status: AC
  Administered 2014-03-28: 60 mg via INTRAVENOUS
  Filled 2014-03-28: qty 30

## 2014-03-28 MED ORDER — SODIUM CHLORIDE 0.9 % IV SOLN
Freq: Once | INTRAVENOUS | Status: AC
  Administered 2014-03-28: 16:00:00 via INTRAVENOUS

## 2014-03-28 MED ORDER — ONDANSETRON 8 MG/50ML IVPB (CHCC)
8.0000 mg | Freq: Once | INTRAVENOUS | Status: AC
  Administered 2014-03-28: 8 mg via INTRAVENOUS

## 2014-03-28 MED ORDER — HEPARIN SOD (PORK) LOCK FLUSH 100 UNIT/ML IV SOLN
500.0000 [IU] | Freq: Once | INTRAVENOUS | Status: AC | PRN
  Administered 2014-03-28: 500 [IU]
  Filled 2014-03-28: qty 5

## 2014-03-28 MED ORDER — SODIUM CHLORIDE 0.9 % IJ SOLN
10.0000 mL | INTRAMUSCULAR | Status: DC | PRN
  Administered 2014-03-28: 10 mL
  Filled 2014-03-28: qty 10

## 2014-03-28 NOTE — Patient Instructions (Signed)
[place] Discharge Instructions for Patients Receiving Chemotherapy  Today you received the following chemotherapy agents kyprolis  To help prevent nausea and vomiting after your treatment, we encourage you to take your nausea medication if needed.   If you develop nausea and vomiting that is not controlled by your nausea medication, call the clinic.   BELOW ARE SYMPTOMS THAT SHOULD BE REPORTED IMMEDIATELY:  *FEVER GREATER THAN 100.5 F  *CHILLS WITH OR WITHOUT FEVER  NAUSEA AND VOMITING THAT IS NOT CONTROLLED WITH YOUR NAUSEA MEDICATION  *UNUSUAL SHORTNESS OF BREATH  *UNUSUAL BRUISING OR BLEEDING  TENDERNESS IN MOUTH AND THROAT WITH OR WITHOUT PRESENCE OF ULCERS  *URINARY PROBLEMS  *BOWEL PROBLEMS  UNUSUAL RASH Items with * indicate a potential emergency and should be followed up as soon as possible.  Feel free to call the clinic you have any questions or concerns. The clinic phone number is [phone].

## 2014-03-31 ENCOUNTER — Other Ambulatory Visit: Payer: Self-pay | Admitting: Oncology

## 2014-03-31 DIAGNOSIS — C9000 Multiple myeloma not having achieved remission: Secondary | ICD-10-CM

## 2014-04-03 ENCOUNTER — Inpatient Hospital Stay: Payer: BLUE CROSS/BLUE SHIELD

## 2014-04-03 ENCOUNTER — Other Ambulatory Visit (HOSPITAL_BASED_OUTPATIENT_CLINIC_OR_DEPARTMENT_OTHER): Payer: BLUE CROSS/BLUE SHIELD

## 2014-04-03 ENCOUNTER — Other Ambulatory Visit: Payer: Self-pay | Admitting: *Deleted

## 2014-04-03 ENCOUNTER — Ambulatory Visit (HOSPITAL_BASED_OUTPATIENT_CLINIC_OR_DEPARTMENT_OTHER): Payer: BLUE CROSS/BLUE SHIELD

## 2014-04-03 DIAGNOSIS — C9000 Multiple myeloma not having achieved remission: Secondary | ICD-10-CM

## 2014-04-03 DIAGNOSIS — Z5112 Encounter for antineoplastic immunotherapy: Secondary | ICD-10-CM

## 2014-04-03 LAB — CBC WITH DIFFERENTIAL/PLATELET
BASO%: 1.4 % (ref 0.0–2.0)
Basophils Absolute: 0.1 10*3/uL (ref 0.0–0.1)
EOS ABS: 0.2 10*3/uL (ref 0.0–0.5)
EOS%: 3.3 % (ref 0.0–7.0)
HCT: 35.6 % — ABNORMAL LOW (ref 38.4–49.9)
HEMOGLOBIN: 11.7 g/dL — AB (ref 13.0–17.1)
LYMPH#: 0.7 10*3/uL — AB (ref 0.9–3.3)
LYMPH%: 14.1 % (ref 14.0–49.0)
MCH: 29.5 pg (ref 27.2–33.4)
MCHC: 32.9 g/dL (ref 32.0–36.0)
MCV: 89.7 fL (ref 79.3–98.0)
MONO#: 0.3 10*3/uL (ref 0.1–0.9)
MONO%: 4.9 % (ref 0.0–14.0)
NEUT%: 76.3 % — ABNORMAL HIGH (ref 39.0–75.0)
NEUTROS ABS: 3.9 10*3/uL (ref 1.5–6.5)
Platelets: 199 10*3/uL (ref 140–400)
RBC: 3.97 10*6/uL — AB (ref 4.20–5.82)
RDW: 15.7 % — ABNORMAL HIGH (ref 11.0–14.6)
WBC: 5.1 10*3/uL (ref 4.0–10.3)

## 2014-04-03 MED ORDER — HEPARIN SOD (PORK) LOCK FLUSH 100 UNIT/ML IV SOLN
500.0000 [IU] | Freq: Once | INTRAVENOUS | Status: AC | PRN
  Administered 2014-04-03: 500 [IU]
  Filled 2014-04-03: qty 5

## 2014-04-03 MED ORDER — SODIUM CHLORIDE 0.9 % IV SOLN
Freq: Once | INTRAVENOUS | Status: AC
  Administered 2014-04-03: 17:00:00 via INTRAVENOUS

## 2014-04-03 MED ORDER — SODIUM CHLORIDE 0.9 % IJ SOLN
10.0000 mL | INTRAMUSCULAR | Status: DC | PRN
Start: 2014-04-03 — End: 2014-04-03
  Administered 2014-04-03: 10 mL
  Filled 2014-04-03: qty 10

## 2014-04-03 MED ORDER — ONDANSETRON 8 MG/NS 50 ML IVPB
INTRAVENOUS | Status: AC
  Filled 2014-04-03: qty 8

## 2014-04-03 MED ORDER — ONDANSETRON 8 MG/50ML IVPB (CHCC)
8.0000 mg | Freq: Once | INTRAVENOUS | Status: AC
  Administered 2014-04-03: 8 mg via INTRAVENOUS

## 2014-04-03 MED ORDER — DEXTROSE 5 % IV SOLN
27.0000 mg/m2 | Freq: Once | INTRAVENOUS | Status: AC
  Administered 2014-04-03: 60 mg via INTRAVENOUS
  Filled 2014-04-03: qty 30

## 2014-04-03 NOTE — Patient Instructions (Signed)
[facility] Discharge Instructions for Patients Receiving Chemotherapy  Today you received the following chemotherapy agents Kyprolis.  To help prevent nausea and vomiting after your treatment, we encourage you to take your nausea medication.   If you develop nausea and vomiting that is not controlled by your nausea medication, call the clinic.   BELOW ARE SYMPTOMS THAT SHOULD BE REPORTED IMMEDIATELY:  *FEVER GREATER THAN 100.5 F  *CHILLS WITH OR WITHOUT FEVER  NAUSEA AND VOMITING THAT IS NOT CONTROLLED WITH YOUR NAUSEA MEDICATION  *UNUSUAL SHORTNESS OF BREATH  *UNUSUAL BRUISING OR BLEEDING  TENDERNESS IN MOUTH AND THROAT WITH OR WITHOUT PRESENCE OF ULCERS  *URINARY PROBLEMS  *BOWEL PROBLEMS  UNUSUAL RASH Items with * indicate a potential emergency and should be followed up as soon as possible.  Feel free to call the clinic you have any questions or concerns. The clinic phone number is [phone].    

## 2014-04-04 ENCOUNTER — Ambulatory Visit (HOSPITAL_BASED_OUTPATIENT_CLINIC_OR_DEPARTMENT_OTHER): Payer: BLUE CROSS/BLUE SHIELD

## 2014-04-04 DIAGNOSIS — C9000 Multiple myeloma not having achieved remission: Secondary | ICD-10-CM

## 2014-04-04 DIAGNOSIS — Z5112 Encounter for antineoplastic immunotherapy: Secondary | ICD-10-CM

## 2014-04-04 MED ORDER — HEPARIN SOD (PORK) LOCK FLUSH 100 UNIT/ML IV SOLN
500.0000 [IU] | Freq: Once | INTRAVENOUS | Status: AC | PRN
  Administered 2014-04-04: 500 [IU]
  Filled 2014-04-04: qty 5

## 2014-04-04 MED ORDER — SODIUM CHLORIDE 0.9 % IV SOLN
Freq: Once | INTRAVENOUS | Status: AC
  Administered 2014-04-04: 12:00:00 via INTRAVENOUS

## 2014-04-04 MED ORDER — SODIUM CHLORIDE 0.9 % IJ SOLN
10.0000 mL | INTRAMUSCULAR | Status: DC | PRN
  Administered 2014-04-04: 10 mL
  Filled 2014-04-04: qty 10

## 2014-04-04 MED ORDER — ONDANSETRON 8 MG/50ML IVPB (CHCC)
8.0000 mg | Freq: Once | INTRAVENOUS | Status: AC
  Administered 2014-04-04: 8 mg via INTRAVENOUS

## 2014-04-04 MED ORDER — ONDANSETRON 8 MG/NS 50 ML IVPB
INTRAVENOUS | Status: AC
  Filled 2014-04-04: qty 8

## 2014-04-04 MED ORDER — DEXTROSE 5 % IV SOLN
27.0000 mg/m2 | Freq: Once | INTRAVENOUS | Status: AC
  Administered 2014-04-04: 60 mg via INTRAVENOUS
  Filled 2014-04-04: qty 30

## 2014-04-04 NOTE — Patient Instructions (Signed)
[facility] Discharge Instructions for Patients Receiving Chemotherapy  Today you received the following chemotherapy agents: Carfilzomib.  To help prevent nausea and vomiting after your treatment, we encourage you to take your nausea medication, Compazine. Take one every six hours as needed.   If you develop nausea and vomiting that is not controlled by your nausea medication, call the clinic.   BELOW ARE SYMPTOMS THAT SHOULD BE REPORTED IMMEDIATELY:  *FEVER GREATER THAN 100.5 F  *CHILLS WITH OR WITHOUT FEVER  NAUSEA AND VOMITING THAT IS NOT CONTROLLED WITH YOUR NAUSEA MEDICATION  *UNUSUAL SHORTNESS OF BREATH  *UNUSUAL BRUISING OR BLEEDING  TENDERNESS IN MOUTH AND THROAT WITH OR WITHOUT PRESENCE OF ULCERS  *URINARY PROBLEMS  *BOWEL PROBLEMS  UNUSUAL RASH Items with * indicate a potential emergency and should be followed up as soon as possible.  Feel free to call the clinic should you have any questions or concerns. The clinic phone number is [phone].

## 2014-04-09 ENCOUNTER — Telehealth: Payer: Self-pay | Admitting: *Deleted

## 2014-04-09 NOTE — Telephone Encounter (Signed)
Per patient request I have moved appts to the afternoon

## 2014-04-13 ENCOUNTER — Other Ambulatory Visit: Payer: Self-pay | Admitting: Oncology

## 2014-04-16 ENCOUNTER — Other Ambulatory Visit: Payer: Self-pay | Admitting: Oncology

## 2014-04-17 ENCOUNTER — Ambulatory Visit (HOSPITAL_BASED_OUTPATIENT_CLINIC_OR_DEPARTMENT_OTHER): Payer: BLUE CROSS/BLUE SHIELD

## 2014-04-17 ENCOUNTER — Other Ambulatory Visit (HOSPITAL_BASED_OUTPATIENT_CLINIC_OR_DEPARTMENT_OTHER): Payer: BLUE CROSS/BLUE SHIELD

## 2014-04-17 ENCOUNTER — Telehealth: Payer: Self-pay | Admitting: *Deleted

## 2014-04-17 ENCOUNTER — Ambulatory Visit (HOSPITAL_BASED_OUTPATIENT_CLINIC_OR_DEPARTMENT_OTHER): Payer: BLUE CROSS/BLUE SHIELD | Admitting: Nurse Practitioner

## 2014-04-17 ENCOUNTER — Other Ambulatory Visit: Payer: Self-pay | Admitting: Nurse Practitioner

## 2014-04-17 ENCOUNTER — Ambulatory Visit (HOSPITAL_COMMUNITY)
Admission: RE | Admit: 2014-04-17 | Discharge: 2014-04-17 | Disposition: A | Discharge status: Home / Self Care | Payer: BLUE CROSS/BLUE SHIELD | Source: Ambulatory Visit | Attending: Nurse Practitioner | Admitting: Nurse Practitioner

## 2014-04-17 ENCOUNTER — Ambulatory Visit: Payer: BLUE CROSS/BLUE SHIELD

## 2014-04-17 ENCOUNTER — Telehealth: Payer: Self-pay | Admitting: Nurse Practitioner

## 2014-04-17 VITALS — BP 114/66 | HR 75 | Temp 97.2°F | Resp 16 | Wt 221.6 lb

## 2014-04-17 DIAGNOSIS — G620 Drug-induced polyneuropathy: Secondary | ICD-10-CM

## 2014-04-17 DIAGNOSIS — R079 Chest pain, unspecified: Secondary | ICD-10-CM | POA: Diagnosis present

## 2014-04-17 DIAGNOSIS — C9000 Multiple myeloma not having achieved remission: Secondary | ICD-10-CM | POA: Insufficient documentation

## 2014-04-17 DIAGNOSIS — Z95828 Presence of other vascular implants and grafts: Secondary | ICD-10-CM

## 2014-04-17 DIAGNOSIS — K589 Irritable bowel syndrome without diarrhea: Secondary | ICD-10-CM

## 2014-04-17 DIAGNOSIS — I10 Essential (primary) hypertension: Secondary | ICD-10-CM

## 2014-04-17 DIAGNOSIS — M79622 Pain in left upper arm: Secondary | ICD-10-CM | POA: Diagnosis not present

## 2014-04-17 DIAGNOSIS — Z5112 Encounter for antineoplastic immunotherapy: Secondary | ICD-10-CM

## 2014-04-17 LAB — CBC WITH DIFFERENTIAL/PLATELET
BASO%: 2.5 % — AB (ref 0.0–2.0)
Basophils Absolute: 0.1 10*3/uL (ref 0.0–0.1)
EOS ABS: 0.2 10*3/uL (ref 0.0–0.5)
EOS%: 5.9 % (ref 0.0–7.0)
HEMATOCRIT: 34.5 % — AB (ref 38.4–49.9)
HGB: 11.5 g/dL — ABNORMAL LOW (ref 13.0–17.1)
LYMPH#: 0.8 10*3/uL — AB (ref 0.9–3.3)
LYMPH%: 23.1 % (ref 14.0–49.0)
MCH: 29.8 pg (ref 27.2–33.4)
MCHC: 33.3 g/dL (ref 32.0–36.0)
MCV: 89.4 fL (ref 79.3–98.0)
MONO#: 0.2 10*3/uL (ref 0.1–0.9)
MONO%: 5.4 % (ref 0.0–14.0)
NEUT%: 63.1 % (ref 39.0–75.0)
NEUTROS ABS: 2.2 10*3/uL (ref 1.5–6.5)
Platelets: 228 10*3/uL (ref 140–400)
RBC: 3.86 10*6/uL — AB (ref 4.20–5.82)
RDW: 15.2 % — ABNORMAL HIGH (ref 11.0–14.6)
WBC: 3.6 10*3/uL — AB (ref 4.0–10.3)

## 2014-04-17 LAB — COMPREHENSIVE METABOLIC PANEL (CC13)
ALBUMIN: 3.4 g/dL — AB (ref 3.5–5.0)
ALT: 41 U/L (ref 0–55)
ANION GAP: 12 meq/L — AB (ref 3–11)
AST: 23 U/L (ref 5–34)
Alkaline Phosphatase: 69 U/L (ref 40–150)
BUN: 13.6 mg/dL (ref 7.0–26.0)
CHLORIDE: 104 meq/L (ref 98–109)
CO2: 24 meq/L (ref 22–29)
Calcium: 9.1 mg/dL (ref 8.4–10.4)
Creatinine: 1.1 mg/dL (ref 0.7–1.3)
EGFR: 74 mL/min/{1.73_m2} — ABNORMAL LOW (ref >90)
Glucose: 162 mg/dl — ABNORMAL HIGH (ref 70–140)
Potassium: 4.2 mEq/L (ref 3.5–5.1)
SODIUM: 140 meq/L (ref 136–145)
TOTAL PROTEIN: 8.1 g/dL (ref 6.4–8.3)
Total Bilirubin: 0.75 mg/dL (ref 0.20–1.20)

## 2014-04-17 MED ORDER — DEXTROSE 5 % IV SOLN
27.0000 mg/m2 | Freq: Once | INTRAVENOUS | Status: AC
  Administered 2014-04-17: 60 mg via INTRAVENOUS
  Filled 2014-04-17: qty 30

## 2014-04-17 MED ORDER — SODIUM CHLORIDE 0.9 % IV SOLN
Freq: Once | INTRAVENOUS | Status: AC
  Administered 2014-04-17: 12:00:00 via INTRAVENOUS

## 2014-04-17 MED ORDER — SODIUM CHLORIDE 0.9 % IJ SOLN
10.0000 mL | INTRAMUSCULAR | Status: DC | PRN
  Administered 2014-04-17: 10 mL
  Filled 2014-04-17: qty 10

## 2014-04-17 MED ORDER — ONDANSETRON 8 MG/NS 50 ML IVPB
INTRAVENOUS | Status: AC
  Filled 2014-04-17: qty 8

## 2014-04-17 MED ORDER — HEPARIN SOD (PORK) LOCK FLUSH 100 UNIT/ML IV SOLN
500.0000 [IU] | Freq: Once | INTRAVENOUS | Status: AC | PRN
  Administered 2014-04-17: 500 [IU]
  Filled 2014-04-17: qty 5

## 2014-04-17 MED ORDER — SODIUM CHLORIDE 0.9 % IJ SOLN
10.0000 mL | INTRAMUSCULAR | Status: DC | PRN
  Administered 2014-04-17: 10 mL via INTRAVENOUS
  Filled 2014-04-17: qty 10

## 2014-04-17 MED ORDER — ONDANSETRON 8 MG/50ML IVPB (CHCC)
8.0000 mg | Freq: Once | INTRAVENOUS | Status: AC
  Administered 2014-04-17: 8 mg via INTRAVENOUS

## 2014-04-17 NOTE — Telephone Encounter (Signed)
Per staff message and POF I have scheduled appts. Advised scheduler of appts and to move lab/flush JMW  

## 2014-04-17 NOTE — Patient Instructions (Signed)

## 2014-04-17 NOTE — Telephone Encounter (Signed)
Pt confirmed labs/ov per 02/04 POF, msg was sent to add chemo, gave pt AVS and sent him to have his X-Rays, called radiology to confirm pt was on his way.... KJ

## 2014-04-17 NOTE — Patient Instructions (Signed)
[facility] Discharge Instructions for Patients Receiving Chemotherapy  Today you received the following chemotherapy agents Kyprolis  To help prevent nausea and vomiting after your treatment, we encourage you to take your nausea medication as directed/prescribed   If you develop nausea and vomiting that is not controlled by your nausea medication, call the clinic.   BELOW ARE SYMPTOMS THAT SHOULD BE REPORTED IMMEDIATELY:  *FEVER GREATER THAN 100.5 F  *CHILLS WITH OR WITHOUT FEVER  NAUSEA AND VOMITING THAT IS NOT CONTROLLED WITH YOUR NAUSEA MEDICATION  *UNUSUAL SHORTNESS OF BREATH  *UNUSUAL BRUISING OR BLEEDING  TENDERNESS IN MOUTH AND THROAT WITH OR WITHOUT PRESENCE OF ULCERS  *URINARY PROBLEMS  *BOWEL PROBLEMS  UNUSUAL RASH Items with * indicate a potential emergency and should be followed up as soon as possible.  Feel free to call the clinic you have any questions or concerns. The clinic phone number is [phone].    

## 2014-04-17 NOTE — Progress Notes (Signed)
[facility] OFFICE PROGRESS NOTE   Diagnosis:  Multiple myeloma.  INTERVAL HISTORY:   Walter Dennis returns as scheduled. He continues weekly Decadron, Carfilzomib and Revlimid. He denies nausea/vomiting. No mouth sores. No change in baseline loose stools. No leg swelling. He has stable neuropathy symptoms in the lower legs and feet. He notes increased dyspnea on exertion. No cough or fever. Over the past 2 weeks he has had pain/tenderness at the left lateral chest/axilla. He denies known injury.  Objective:  Vital signs in last 24 hours:  Blood pressure 114/66, pulse 75, temperature 97.2 F (36.2 C), temperature source Oral, resp. rate 16, weight 221 lb 9.6 oz (100.517 kg), SpO2 98 %.    HEENT: No thrush or ulcers. Resp: Lungs clear bilaterally. Cardio: Regular rate and rhythm. GI: Abdomen soft and nontender. No hepatomegaly. Vascular: No leg edema.  Musculoskeletal: Tender over the left lateral chest wall/axilla. No rash. Port-A-Cath without erythema.    Lab Results:  Lab Results  Component Value Date   WBC 3.6* 04/17/2014   HGB 11.5* 04/17/2014   HCT 34.5* 04/17/2014   MCV 89.4 04/17/2014   PLT 228 04/17/2014   NEUTROABS 2.2 04/17/2014    Imaging:  No results found.  Medications: I have reviewed the patient's current medications.  Assessment/Plan: 1.Multiple myeloma, IgG lambda monoclonal protein  -Initial diagnosis 2008, bone marrow with a 40-50% plasma cells  -Lenalidomide 25 mg,day 1-21 and Decadron 40 mg weekly and 7939, complicated by peripheral neuropathy  -Therapy change to bortezomib IV twice weekly plus Decadron  -High-dose chemotherapy with autologous stem cell transplantation 2009 at [facility]  -Bortezomib, cyclophosphamide, and Decadron December 2012 through February 2014 (discontinued secondary to pulmonary aspergillosis)  -January 2014 M spike rise to 1.1 g/dL from 0.1 g/dL in December 2013. Treatment started with Carlfilzomib,  lenalidomide, and Decadron.  -treatment was restarted with Carlfilzomib, lenalidomide, and Decadron 08/16/2012.  -Restaging labs on 10/15/2012 consistent with improvement in the serum M. Protein.  -Continuation of Carfilzomib/Revlimid/dexamethasone.  -Continued improvement in the serum M spike and IgG level on 11/29/2012.  -Improvement in the serum M spike, slight increase in IgG on 01/03/2013.  -Improvement in the serum M spike, stable IgG, stable lambda free light chains 01/31/2013  -The IgG and serum M spike were slightly higher 02/28/2013, stable lambda light chains  -IgG, M spike, and lambda light chains slightly lower on 04/04/2013.  -Continuation of Carfilzomib/Revlimid/dexamethasone.  -IgG, M spike and lambda light chains stable 05/02/2013.  -IgG, serum M spike, and lambda free light chains stable on 07/11/2013; 08/08/2013.  -IgG, serum M spike, and lambda light chains higher 10/17/2013 after being off treatment during July 2015  - Carfilzomib/decadron continued with a cycle starting 10/17/2013  -Increase in serum M spike, light chains and IgG 02/13/2014. - Carfilzomib/ Decadron continued, Revlimid resumed. 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 He has been unable to lower the Duragesic patch dose  4. Irritable bowel syndrome  5. Hypertension  6. "Chest pain " and dyspnea with exertion ,evaluated by cardiology, . Normal stress nuclear study 11/07/2012 and 08/07/2013.  7. history of Diarrhea. Most likely related to Revlimid  8. Febrile illness 12/05/2013 with associated diffuse bone pain-no source for infection identified    Disposition: Walter Dennis appears stable. Plan to continue Carfilzomib/Decadron and Revlimid. We will follow-up on the outstanding labs from today. He will return for a follow-up visit in 4 weeks. He will contact the office in the interim with any  problems. We specifically discussed  worsening pain, fever, cough.  We are referring him for left rib films today to evaluate the pain/tenderness at the left lateral chest/axilla.  Plan reviewed with Dr. Benay Spice.  Ned Card ANP/GNP-BC   04/17/2014  11:15 AM

## 2014-04-18 ENCOUNTER — Telehealth: Payer: Self-pay

## 2014-04-18 ENCOUNTER — Ambulatory Visit (HOSPITAL_BASED_OUTPATIENT_CLINIC_OR_DEPARTMENT_OTHER): Payer: BLUE CROSS/BLUE SHIELD

## 2014-04-18 DIAGNOSIS — C9000 Multiple myeloma not having achieved remission: Secondary | ICD-10-CM

## 2014-04-18 DIAGNOSIS — Z5112 Encounter for antineoplastic immunotherapy: Secondary | ICD-10-CM

## 2014-04-18 MED ORDER — SODIUM CHLORIDE 0.9 % IJ SOLN
10.0000 mL | INTRAMUSCULAR | Status: DC | PRN
  Administered 2014-04-18: 10 mL
  Filled 2014-04-18: qty 10

## 2014-04-18 MED ORDER — ONDANSETRON 8 MG/50ML IVPB (CHCC)
8.0000 mg | Freq: Once | INTRAVENOUS | Status: AC
  Administered 2014-04-18: 8 mg via INTRAVENOUS

## 2014-04-18 MED ORDER — DEXTROSE 5 % IV SOLN
27.0000 mg/m2 | Freq: Once | INTRAVENOUS | Status: AC
  Administered 2014-04-18: 60 mg via INTRAVENOUS
  Filled 2014-04-18: qty 30

## 2014-04-18 MED ORDER — ONDANSETRON 8 MG/NS 50 ML IVPB
INTRAVENOUS | Status: AC
  Filled 2014-04-18: qty 8

## 2014-04-18 MED ORDER — SODIUM CHLORIDE 0.9 % IV SOLN
Freq: Once | INTRAVENOUS | Status: AC
Start: 2014-04-18 — End: 2014-04-18
  Administered 2014-04-18: 16:00:00 via INTRAVENOUS

## 2014-04-18 MED ORDER — HEPARIN SOD (PORK) LOCK FLUSH 100 UNIT/ML IV SOLN
500.0000 [IU] | Freq: Once | INTRAVENOUS | Status: AC | PRN
  Administered 2014-04-18: 500 [IU]
  Filled 2014-04-18: qty 5

## 2014-04-18 NOTE — Telephone Encounter (Signed)
-----   Message from Owens Shark, NP sent at 04/18/2014 11:46 AM EST ----- Please notify him the rib x-rays were negative.

## 2014-04-18 NOTE — Telephone Encounter (Signed)
Called and informed pt x-rays were negative. Pt verbalized understanding and denies any questions or concerns at this time.

## 2014-04-18 NOTE — Patient Instructions (Signed)
[facility] Discharge Instructions for Patients Receiving Chemotherapy  Today you received the following chemotherapy agents: Kyprolis.  To help prevent nausea and vomiting after your treatment, we encourage you to take your nausea medication: Zofran 8 mg every 12 hours.   If you develop nausea and vomiting that is not controlled by your nausea medication, call the clinic.   BELOW ARE SYMPTOMS THAT SHOULD BE REPORTED IMMEDIATELY:  *FEVER GREATER THAN 100.5 F  *CHILLS WITH OR WITHOUT FEVER  NAUSEA AND VOMITING THAT IS NOT CONTROLLED WITH YOUR NAUSEA MEDICATION  *UNUSUAL SHORTNESS OF BREATH  *UNUSUAL BRUISING OR BLEEDING  TENDERNESS IN MOUTH AND THROAT WITH OR WITHOUT PRESENCE OF ULCERS  *URINARY PROBLEMS  *BOWEL PROBLEMS  UNUSUAL RASH Items with * indicate a potential emergency and should be followed up as soon as possible.  Feel free to call the clinic you have any questions or concerns. The clinic phone number is [phone].

## 2014-04-21 LAB — PROTEIN ELECTROPHORESIS, SERUM
ALBUMIN ELP: 45.3 % — AB (ref 55.8–66.1)
Alpha-1-Globulin: 3.4 % (ref 2.9–4.9)
Alpha-2-Globulin: 7.7 % (ref 7.1–11.8)
BETA 2: 36.6 % — AB (ref 3.2–6.5)
Beta Globulin: 5.5 % (ref 4.7–7.2)
GAMMA GLOBULIN: 1.5 % — AB (ref 11.1–18.8)
M-SPIKE, %: 2.47 g/dL
Total Protein, Serum Electrophoresis: 8.1 g/dL (ref 6.0–8.3)

## 2014-04-21 LAB — KAPPA/LAMBDA LIGHT CHAINS
KAPPA FREE LGHT CHN: 0.2 mg/dL — AB (ref 0.33–1.94)
Kappa:Lambda Ratio: 0 — ABNORMAL LOW (ref 0.26–1.65)
LAMBDA FREE LGHT CHN: 74.2 mg/dL — AB (ref 0.57–2.63)

## 2014-04-22 NOTE — Progress Notes (Signed)
This encounter was created in error - please disregard.  This encounter was created in error - please disregard.

## 2014-04-23 ENCOUNTER — Ambulatory Visit: Payer: BC Managed Care – PPO | Admitting: Cardiovascular Disease

## 2014-04-23 ENCOUNTER — Encounter: Payer: BLUE CROSS/BLUE SHIELD | Admitting: Cardiovascular Disease

## 2014-04-24 ENCOUNTER — Ambulatory Visit (HOSPITAL_BASED_OUTPATIENT_CLINIC_OR_DEPARTMENT_OTHER): Payer: BLUE CROSS/BLUE SHIELD

## 2014-04-24 ENCOUNTER — Other Ambulatory Visit: Payer: Self-pay | Admitting: Pharmacist

## 2014-04-24 ENCOUNTER — Ambulatory Visit: Payer: BLUE CROSS/BLUE SHIELD

## 2014-04-24 ENCOUNTER — Other Ambulatory Visit (HOSPITAL_BASED_OUTPATIENT_CLINIC_OR_DEPARTMENT_OTHER): Payer: BLUE CROSS/BLUE SHIELD

## 2014-04-24 DIAGNOSIS — C9000 Multiple myeloma not having achieved remission: Secondary | ICD-10-CM

## 2014-04-24 DIAGNOSIS — Z95828 Presence of other vascular implants and grafts: Secondary | ICD-10-CM

## 2014-04-24 DIAGNOSIS — Z5112 Encounter for antineoplastic immunotherapy: Secondary | ICD-10-CM

## 2014-04-24 LAB — CBC WITH DIFFERENTIAL/PLATELET
BASO%: 0.4 % (ref 0.0–2.0)
BASOS ABS: 0 10*3/uL (ref 0.0–0.1)
EOS%: 1 % (ref 0.0–7.0)
Eosinophils Absolute: 0.1 10*3/uL (ref 0.0–0.5)
HEMATOCRIT: 33.9 % — AB (ref 38.4–49.9)
HEMOGLOBIN: 11.4 g/dL — AB (ref 13.0–17.1)
LYMPH#: 0.7 10*3/uL — AB (ref 0.9–3.3)
LYMPH%: 13.8 % — ABNORMAL LOW (ref 14.0–49.0)
MCH: 29.9 pg (ref 27.2–33.4)
MCHC: 33.6 g/dL (ref 32.0–36.0)
MCV: 89 fL (ref 79.3–98.0)
MONO#: 0.1 10*3/uL (ref 0.1–0.9)
MONO%: 1.4 % (ref 0.0–14.0)
NEUT%: 83.4 % — ABNORMAL HIGH (ref 39.0–75.0)
NEUTROS ABS: 4.3 10*3/uL (ref 1.5–6.5)
Platelets: 209 10*3/uL (ref 140–400)
RBC: 3.81 10*6/uL — ABNORMAL LOW (ref 4.20–5.82)
RDW: 15.6 % — ABNORMAL HIGH (ref 11.0–14.6)
WBC: 5.2 10*3/uL (ref 4.0–10.3)

## 2014-04-24 MED ORDER — ONDANSETRON 8 MG/50ML IVPB (CHCC)
8.0000 mg | Freq: Once | INTRAVENOUS | Status: AC
  Administered 2014-04-24: 8 mg via INTRAVENOUS

## 2014-04-24 MED ORDER — SODIUM CHLORIDE 0.9 % IV SOLN
Freq: Once | INTRAVENOUS | Status: AC
  Administered 2014-04-24: 16:00:00 via INTRAVENOUS

## 2014-04-24 MED ORDER — DEXTROSE 5 % IV SOLN
27.0000 mg/m2 | Freq: Once | INTRAVENOUS | Status: AC
  Administered 2014-04-24: 60 mg via INTRAVENOUS
  Filled 2014-04-24: qty 30

## 2014-04-24 MED ORDER — ONDANSETRON 8 MG/NS 50 ML IVPB
INTRAVENOUS | Status: AC
  Filled 2014-04-24: qty 8

## 2014-04-24 MED ORDER — HEPARIN SOD (PORK) LOCK FLUSH 100 UNIT/ML IV SOLN
500.0000 [IU] | Freq: Once | INTRAVENOUS | Status: AC | PRN
  Administered 2014-04-24: 500 [IU]
  Filled 2014-04-24: qty 5

## 2014-04-24 MED ORDER — SODIUM CHLORIDE 0.9 % IJ SOLN
10.0000 mL | INTRAMUSCULAR | Status: DC | PRN
  Administered 2014-04-24: 10 mL
  Filled 2014-04-24: qty 10

## 2014-04-24 MED ORDER — SODIUM CHLORIDE 0.9 % IJ SOLN
10.0000 mL | INTRAMUSCULAR | Status: DC | PRN
  Administered 2014-04-24: 10 mL via INTRAVENOUS
  Filled 2014-04-24: qty 10

## 2014-04-24 NOTE — Patient Instructions (Signed)
[place] Discharge Instructions for Patients Receiving Chemotherapy  Today you received the following chemotherapy agents: Kyprolis.  To help prevent nausea and vomiting after your treatment, we encourage you to take your nausea medication: Zofran 8mg  every 12 hours as needed.   If you develop nausea and vomiting that is not controlled by your nausea medication, call the clinic.   BELOW ARE SYMPTOMS THAT SHOULD BE REPORTED IMMEDIATELY:  *FEVER GREATER THAN 100.5 F  *CHILLS WITH OR WITHOUT FEVER  NAUSEA AND VOMITING THAT IS NOT CONTROLLED WITH YOUR NAUSEA MEDICATION  *UNUSUAL SHORTNESS OF BREATH  *UNUSUAL BRUISING OR BLEEDING  TENDERNESS IN MOUTH AND THROAT WITH OR WITHOUT PRESENCE OF ULCERS  *URINARY PROBLEMS  *BOWEL PROBLEMS  UNUSUAL RASH Items with * indicate a potential emergency and should be followed up as soon as possible.  Feel free to call the clinic you have any questions or concerns. The clinic phone number is [phone].

## 2014-04-24 NOTE — Patient Instructions (Signed)

## 2014-04-25 ENCOUNTER — Ambulatory Visit (HOSPITAL_BASED_OUTPATIENT_CLINIC_OR_DEPARTMENT_OTHER): Payer: BLUE CROSS/BLUE SHIELD

## 2014-04-25 DIAGNOSIS — Z5112 Encounter for antineoplastic immunotherapy: Secondary | ICD-10-CM

## 2014-04-25 DIAGNOSIS — C9000 Multiple myeloma not having achieved remission: Secondary | ICD-10-CM

## 2014-04-25 MED ORDER — HEPARIN SOD (PORK) LOCK FLUSH 100 UNIT/ML IV SOLN
500.0000 [IU] | Freq: Once | INTRAVENOUS | Status: AC | PRN
  Administered 2014-04-25: 500 [IU]
  Filled 2014-04-25: qty 5

## 2014-04-25 MED ORDER — ONDANSETRON 8 MG/NS 50 ML IVPB
INTRAVENOUS | Status: AC
  Filled 2014-04-25: qty 8

## 2014-04-25 MED ORDER — CARFILZOMIB CHEMO INJECTION 60 MG
27.0000 mg/m2 | Freq: Once | INTRAVENOUS | Status: AC
  Administered 2014-04-25: 60 mg via INTRAVENOUS
  Filled 2014-04-25: qty 30

## 2014-04-25 MED ORDER — SODIUM CHLORIDE 0.9 % IJ SOLN
10.0000 mL | INTRAMUSCULAR | Status: DC | PRN
  Administered 2014-04-25: 10 mL
  Filled 2014-04-25: qty 10

## 2014-04-25 MED ORDER — SODIUM CHLORIDE 0.9 % IV SOLN
Freq: Once | INTRAVENOUS | Status: AC
  Administered 2014-04-25: 16:00:00 via INTRAVENOUS

## 2014-04-25 MED ORDER — ONDANSETRON 8 MG/50ML IVPB (CHCC)
8.0000 mg | Freq: Once | INTRAVENOUS | Status: AC
  Administered 2014-04-25: 8 mg via INTRAVENOUS

## 2014-04-25 NOTE — Patient Instructions (Signed)
[facility] Discharge Instructions for Patients Receiving Chemotherapy  Today you received the following chemotherapy agents Kyprolis To help prevent nausea and vomiting after your treatment, we encourage you to take your nausea medication as prescribed.  If you develop nausea and vomiting that is not controlled by your nausea medication, call the clinic.   BELOW ARE SYMPTOMS THAT SHOULD BE REPORTED IMMEDIATELY:  *FEVER GREATER THAN 100.5 F  *CHILLS WITH OR WITHOUT FEVER  NAUSEA AND VOMITING THAT IS NOT CONTROLLED WITH YOUR NAUSEA MEDICATION  *UNUSUAL SHORTNESS OF BREATH  *UNUSUAL BRUISING OR BLEEDING  TENDERNESS IN MOUTH AND THROAT WITH OR WITHOUT PRESENCE OF ULCERS  *URINARY PROBLEMS  *BOWEL PROBLEMS  UNUSUAL RASH Items with * indicate a potential emergency and should be followed up as soon as possible.  Feel free to call the clinic you have any questions or concerns. The clinic phone number is [phone].    

## 2014-04-30 ENCOUNTER — Other Ambulatory Visit: Payer: Self-pay | Admitting: Oncology

## 2014-04-30 DIAGNOSIS — C9000 Multiple myeloma not having achieved remission: Secondary | ICD-10-CM

## 2014-05-01 ENCOUNTER — Ambulatory Visit (HOSPITAL_BASED_OUTPATIENT_CLINIC_OR_DEPARTMENT_OTHER): Payer: BLUE CROSS/BLUE SHIELD

## 2014-05-01 ENCOUNTER — Other Ambulatory Visit (HOSPITAL_BASED_OUTPATIENT_CLINIC_OR_DEPARTMENT_OTHER): Payer: BLUE CROSS/BLUE SHIELD

## 2014-05-01 ENCOUNTER — Ambulatory Visit: Payer: BLUE CROSS/BLUE SHIELD

## 2014-05-01 DIAGNOSIS — Z95828 Presence of other vascular implants and grafts: Secondary | ICD-10-CM

## 2014-05-01 DIAGNOSIS — Z5112 Encounter for antineoplastic immunotherapy: Secondary | ICD-10-CM

## 2014-05-01 DIAGNOSIS — C9000 Multiple myeloma not having achieved remission: Secondary | ICD-10-CM

## 2014-05-01 LAB — COMPREHENSIVE METABOLIC PANEL (CC13)
ALT: 38 U/L (ref 0–55)
ANION GAP: 11 meq/L (ref 3–11)
AST: 26 U/L (ref 5–34)
Albumin: 3.4 g/dL — ABNORMAL LOW (ref 3.5–5.0)
Alkaline Phosphatase: 69 U/L (ref 40–150)
BILIRUBIN TOTAL: 0.68 mg/dL (ref 0.20–1.20)
BUN: 16.6 mg/dL (ref 7.0–26.0)
CALCIUM: 9.1 mg/dL (ref 8.4–10.4)
CO2: 21 meq/L — AB (ref 22–29)
Chloride: 106 mEq/L (ref 98–109)
Creatinine: 1.4 mg/dL — ABNORMAL HIGH (ref 0.7–1.3)
EGFR: 55 mL/min/{1.73_m2} — ABNORMAL LOW (ref >90)
GLUCOSE: 192 mg/dL — AB (ref 70–140)
Potassium: 4.7 mEq/L (ref 3.5–5.1)
Sodium: 139 mEq/L (ref 136–145)
TOTAL PROTEIN: 8.1 g/dL (ref 6.4–8.3)

## 2014-05-01 LAB — CBC WITH DIFFERENTIAL/PLATELET
BASO%: 0.5 % (ref 0.0–2.0)
Basophils Absolute: 0 10*3/uL (ref 0.0–0.1)
EOS ABS: 0.1 10*3/uL (ref 0.0–0.5)
EOS%: 0.8 % (ref 0.0–7.0)
HCT: 34.3 % — ABNORMAL LOW (ref 38.4–49.9)
HGB: 11.4 g/dL — ABNORMAL LOW (ref 13.0–17.1)
LYMPH%: 11.7 % — ABNORMAL LOW (ref 14.0–49.0)
MCH: 29.7 pg (ref 27.2–33.4)
MCHC: 33.2 g/dL (ref 32.0–36.0)
MCV: 89.3 fL (ref 79.3–98.0)
MONO#: 0.1 10*3/uL (ref 0.1–0.9)
MONO%: 1.5 % (ref 0.0–14.0)
NEUT%: 85.5 % — ABNORMAL HIGH (ref 39.0–75.0)
NEUTROS ABS: 5.6 10*3/uL (ref 1.5–6.5)
Platelets: 184 10*3/uL (ref 140–400)
RBC: 3.84 10*6/uL — ABNORMAL LOW (ref 4.20–5.82)
RDW: 15.8 % — ABNORMAL HIGH (ref 11.0–14.6)
WBC: 6.6 10*3/uL (ref 4.0–10.3)
lymph#: 0.8 10*3/uL — ABNORMAL LOW (ref 0.9–3.3)

## 2014-05-01 MED ORDER — SODIUM CHLORIDE 0.9 % IV SOLN
Freq: Once | INTRAVENOUS | Status: AC
  Administered 2014-05-01: 16:00:00 via INTRAVENOUS

## 2014-05-01 MED ORDER — HEPARIN SOD (PORK) LOCK FLUSH 100 UNIT/ML IV SOLN
500.0000 [IU] | Freq: Once | INTRAVENOUS | Status: AC | PRN
  Administered 2014-05-01: 500 [IU]
  Filled 2014-05-01: qty 5

## 2014-05-01 MED ORDER — SODIUM CHLORIDE 0.9 % IJ SOLN
10.0000 mL | INTRAMUSCULAR | Status: DC | PRN
  Administered 2014-05-01: 10 mL via INTRAVENOUS
  Filled 2014-05-01: qty 10

## 2014-05-01 MED ORDER — ONDANSETRON 8 MG/50ML IVPB (CHCC)
8.0000 mg | Freq: Once | INTRAVENOUS | Status: AC
  Administered 2014-05-01: 8 mg via INTRAVENOUS

## 2014-05-01 MED ORDER — DEXTROSE 5 % IV SOLN
27.0000 mg/m2 | Freq: Once | INTRAVENOUS | Status: AC
  Administered 2014-05-01: 60 mg via INTRAVENOUS
  Filled 2014-05-01: qty 30

## 2014-05-01 MED ORDER — SODIUM CHLORIDE 0.9 % IJ SOLN
10.0000 mL | INTRAMUSCULAR | Status: DC | PRN
  Administered 2014-05-01: 10 mL
  Filled 2014-05-01: qty 10

## 2014-05-01 NOTE — Patient Instructions (Signed)
[facility] Discharge Instructions for Patients Receiving Chemotherapy  Today you received the following chemotherapy agents Kyprolis To help prevent nausea and vomiting after your treatment, we encourage you to take your nausea medication as prescribed.  If you develop nausea and vomiting that is not controlled by your nausea medication, call the clinic.   BELOW ARE SYMPTOMS THAT SHOULD BE REPORTED IMMEDIATELY:  *FEVER GREATER THAN 100.5 F  *CHILLS WITH OR WITHOUT FEVER  NAUSEA AND VOMITING THAT IS NOT CONTROLLED WITH YOUR NAUSEA MEDICATION  *UNUSUAL SHORTNESS OF BREATH  *UNUSUAL BRUISING OR BLEEDING  TENDERNESS IN MOUTH AND THROAT WITH OR WITHOUT PRESENCE OF ULCERS  *URINARY PROBLEMS  *BOWEL PROBLEMS  UNUSUAL RASH Items with * indicate a potential emergency and should be followed up as soon as possible.  Feel free to call the clinic you have any questions or concerns. The clinic phone number is [phone].    

## 2014-05-01 NOTE — Patient Instructions (Signed)

## 2014-05-02 ENCOUNTER — Ambulatory Visit (HOSPITAL_BASED_OUTPATIENT_CLINIC_OR_DEPARTMENT_OTHER): Payer: BLUE CROSS/BLUE SHIELD

## 2014-05-02 ENCOUNTER — Other Ambulatory Visit: Payer: Self-pay | Admitting: *Deleted

## 2014-05-02 DIAGNOSIS — N08 Glomerular disorders in diseases classified elsewhere: Principal | ICD-10-CM

## 2014-05-02 DIAGNOSIS — C9000 Multiple myeloma not having achieved remission: Secondary | ICD-10-CM

## 2014-05-02 DIAGNOSIS — Z5112 Encounter for antineoplastic immunotherapy: Secondary | ICD-10-CM

## 2014-05-02 MED ORDER — DEXTROSE 5 % IV SOLN
27.0000 mg/m2 | Freq: Once | INTRAVENOUS | Status: AC
  Administered 2014-05-02: 60 mg via INTRAVENOUS
  Filled 2014-05-02: qty 30

## 2014-05-02 MED ORDER — SODIUM CHLORIDE 0.9 % IJ SOLN
10.0000 mL | INTRAMUSCULAR | Status: DC | PRN
  Administered 2014-05-02: 10 mL
  Filled 2014-05-02: qty 10

## 2014-05-02 MED ORDER — SODIUM CHLORIDE 0.9 % IV SOLN
Freq: Once | INTRAVENOUS | Status: AC
  Administered 2014-05-02: 16:00:00 via INTRAVENOUS

## 2014-05-02 MED ORDER — ONDANSETRON 8 MG/NS 50 ML IVPB
INTRAVENOUS | Status: AC
  Filled 2014-05-02: qty 8

## 2014-05-02 MED ORDER — FENTANYL 25 MCG/HR TD PT72: 25.0000 ug | MEDICATED_PATCH | TRANSDERMAL | Status: DC

## 2014-05-02 MED ORDER — ONDANSETRON 8 MG/50ML IVPB (CHCC)
8.0000 mg | Freq: Once | INTRAVENOUS | Status: AC
  Administered 2014-05-02: 8 mg via INTRAVENOUS

## 2014-05-02 MED ORDER — HEPARIN SOD (PORK) LOCK FLUSH 100 UNIT/ML IV SOLN
500.0000 [IU] | Freq: Once | INTRAVENOUS | Status: AC | PRN
  Administered 2014-05-02: 500 [IU]
  Filled 2014-05-02: qty 5

## 2014-05-02 NOTE — Telephone Encounter (Signed)
Pt requested refill on Fentanyl patch during his treatment on 2/18. Rx will be left in prescription book for pick up today.

## 2014-05-02 NOTE — Patient Instructions (Signed)
[facility] Discharge Instructions for Patients Receiving Chemotherapy  Today you received the following chemotherapy agents kyprolis  To help prevent nausea and vomiting after your treatment, we encourage you to take your nausea medication if needed   If you develop nausea and vomiting that is not controlled by your nausea medication, call the clinic.   BELOW ARE SYMPTOMS THAT SHOULD BE REPORTED IMMEDIATELY:  *FEVER GREATER THAN 100.5 F  *CHILLS WITH OR WITHOUT FEVER  NAUSEA AND VOMITING THAT IS NOT CONTROLLED WITH YOUR NAUSEA MEDICATION  *UNUSUAL SHORTNESS OF BREATH  *UNUSUAL BRUISING OR BLEEDING  TENDERNESS IN MOUTH AND THROAT WITH OR WITHOUT PRESENCE OF ULCERS  *URINARY PROBLEMS  *BOWEL PROBLEMS  UNUSUAL RASH Items with * indicate a potential emergency and should be followed up as soon as possible.  Feel free to call the clinic you have any questions or concerns. The clinic phone number is [phone].

## 2014-05-09 ENCOUNTER — Telehealth: Payer: Self-pay | Admitting: *Deleted

## 2014-05-09 ENCOUNTER — Telehealth: Payer: Self-pay | Admitting: Oncology

## 2014-05-09 NOTE — Telephone Encounter (Signed)
s.w. pt and advised on lab moved from 3.3 to 3.1.Marland KitchenMarland KitchenMarland Kitchenpt ok and aware of new d.t

## 2014-05-09 NOTE — Telephone Encounter (Signed)
Call pt to offer lab appt prior to office visit so results will be available during visit. He requested lab appt for 05/13/14. Order to schedulers.

## 2014-05-11 ENCOUNTER — Other Ambulatory Visit: Payer: Self-pay | Admitting: Oncology

## 2014-05-13 ENCOUNTER — Encounter: Payer: Self-pay | Admitting: Cardiovascular Disease

## 2014-05-13 ENCOUNTER — Other Ambulatory Visit: Payer: BLUE CROSS/BLUE SHIELD

## 2014-05-14 ENCOUNTER — Other Ambulatory Visit: Payer: Self-pay | Admitting: *Deleted

## 2014-05-15 ENCOUNTER — Other Ambulatory Visit (HOSPITAL_BASED_OUTPATIENT_CLINIC_OR_DEPARTMENT_OTHER): Payer: BLUE CROSS/BLUE SHIELD

## 2014-05-15 ENCOUNTER — Ambulatory Visit (HOSPITAL_BASED_OUTPATIENT_CLINIC_OR_DEPARTMENT_OTHER): Payer: BLUE CROSS/BLUE SHIELD

## 2014-05-15 ENCOUNTER — Telehealth: Payer: Self-pay | Admitting: Oncology

## 2014-05-15 ENCOUNTER — Other Ambulatory Visit: Payer: Self-pay | Admitting: *Deleted

## 2014-05-15 ENCOUNTER — Ambulatory Visit (HOSPITAL_BASED_OUTPATIENT_CLINIC_OR_DEPARTMENT_OTHER): Payer: BLUE CROSS/BLUE SHIELD | Admitting: Oncology

## 2014-05-15 ENCOUNTER — Other Ambulatory Visit: Payer: BLUE CROSS/BLUE SHIELD

## 2014-05-15 ENCOUNTER — Ambulatory Visit: Payer: BLUE CROSS/BLUE SHIELD

## 2014-05-15 VITALS — BP 127/73 | HR 67 | Temp 98.3°F | Wt 220.5 lb

## 2014-05-15 VITALS — Wt 220.5 lb

## 2014-05-15 DIAGNOSIS — Z95828 Presence of other vascular implants and grafts: Secondary | ICD-10-CM

## 2014-05-15 DIAGNOSIS — I10 Essential (primary) hypertension: Secondary | ICD-10-CM

## 2014-05-15 DIAGNOSIS — C9000 Multiple myeloma not having achieved remission: Secondary | ICD-10-CM

## 2014-05-15 DIAGNOSIS — Z5112 Encounter for antineoplastic immunotherapy: Secondary | ICD-10-CM

## 2014-05-15 DIAGNOSIS — G622 Polyneuropathy due to other toxic agents: Secondary | ICD-10-CM

## 2014-05-15 DIAGNOSIS — K589 Irritable bowel syndrome without diarrhea: Secondary | ICD-10-CM

## 2014-05-15 DIAGNOSIS — R0789 Other chest pain: Secondary | ICD-10-CM

## 2014-05-15 LAB — COMPREHENSIVE METABOLIC PANEL (CC13)
ALT: 33 U/L (ref 0–55)
ANION GAP: 12 meq/L — AB (ref 3–11)
AST: 18 U/L (ref 5–34)
Albumin: 3.5 g/dL (ref 3.5–5.0)
Alkaline Phosphatase: 62 U/L (ref 40–150)
BUN: 15 mg/dL (ref 7.0–26.0)
CO2: 23 mEq/L (ref 22–29)
CREATININE: 1.2 mg/dL (ref 0.7–1.3)
Calcium: 9.2 mg/dL (ref 8.4–10.4)
Chloride: 103 mEq/L (ref 98–109)
EGFR: 66 mL/min/{1.73_m2} — AB (ref >90)
Glucose: 166 mg/dl — ABNORMAL HIGH (ref 70–140)
POTASSIUM: 4.2 meq/L (ref 3.5–5.1)
Sodium: 137 mEq/L (ref 136–145)
Total Bilirubin: 0.73 mg/dL (ref 0.20–1.20)
Total Protein: 8.5 g/dL — ABNORMAL HIGH (ref 6.4–8.3)

## 2014-05-15 LAB — CBC WITH DIFFERENTIAL/PLATELET
BASO%: 1 % (ref 0.0–2.0)
Basophils Absolute: 0.1 10*3/uL (ref 0.0–0.1)
EOS%: 1.8 % (ref 0.0–7.0)
Eosinophils Absolute: 0.1 10*3/uL (ref 0.0–0.5)
HCT: 34.1 % — ABNORMAL LOW (ref 38.4–49.9)
HGB: 11.4 g/dL — ABNORMAL LOW (ref 13.0–17.1)
LYMPH#: 0.8 10*3/uL — AB (ref 0.9–3.3)
LYMPH%: 15.3 % (ref 14.0–49.0)
MCH: 29.4 pg (ref 27.2–33.4)
MCHC: 33.4 g/dL (ref 32.0–36.0)
MCV: 87.9 fL (ref 79.3–98.0)
MONO#: 0.1 10*3/uL (ref 0.1–0.9)
MONO%: 2.2 % (ref 0.0–14.0)
NEUT#: 4 10*3/uL (ref 1.5–6.5)
NEUT%: 79.7 % — ABNORMAL HIGH (ref 39.0–75.0)
Platelets: 233 10*3/uL (ref 140–400)
RBC: 3.88 10*6/uL — AB (ref 4.20–5.82)
RDW: 15.6 % — AB (ref 11.0–14.6)
WBC: 5 10*3/uL (ref 4.0–10.3)

## 2014-05-15 MED ORDER — ONDANSETRON 8 MG/NS 50 ML IVPB
INTRAVENOUS | Status: AC
  Filled 2014-05-15: qty 8

## 2014-05-15 MED ORDER — ONDANSETRON 8 MG/50ML IVPB (CHCC)
8.0000 mg | Freq: Once | INTRAVENOUS | Status: AC
  Administered 2014-05-15: 8 mg via INTRAVENOUS

## 2014-05-15 MED ORDER — CARFILZOMIB CHEMO INJECTION 60 MG
27.0000 mg/m2 | Freq: Once | INTRAVENOUS | Status: AC
  Administered 2014-05-15: 60 mg via INTRAVENOUS
  Filled 2014-05-15: qty 30

## 2014-05-15 MED ORDER — SODIUM CHLORIDE 0.9 % IV SOLN
Freq: Once | INTRAVENOUS | Status: AC
  Administered 2014-05-15: 12:00:00 via INTRAVENOUS

## 2014-05-15 MED ORDER — SODIUM CHLORIDE 0.9 % IJ SOLN
10.0000 mL | INTRAMUSCULAR | Status: DC | PRN
  Administered 2014-05-15: 10 mL
  Filled 2014-05-15: qty 10

## 2014-05-15 MED ORDER — PREGABALIN 75 MG PO CAPS: 75.0000 mg | ORAL_CAPSULE | Freq: Two times a day (BID) | ORAL | Status: DC

## 2014-05-15 MED ORDER — SODIUM CHLORIDE 0.9 % IJ SOLN
10.0000 mL | INTRAMUSCULAR | Status: DC | PRN
  Administered 2014-05-15: 10 mL via INTRAVENOUS
  Filled 2014-05-15: qty 10

## 2014-05-15 MED ORDER — HEPARIN SOD (PORK) LOCK FLUSH 100 UNIT/ML IV SOLN
500.0000 [IU] | Freq: Once | INTRAVENOUS | Status: AC | PRN
  Administered 2014-05-15: 500 [IU]
  Filled 2014-05-15: qty 5

## 2014-05-15 NOTE — Progress Notes (Signed)
Patient took his decadron 40mg  at home.

## 2014-05-15 NOTE — Patient Instructions (Signed)

## 2014-05-15 NOTE — Patient Instructions (Signed)
[facility] Discharge Instructions for Patients Receiving Chemotherapy  Today you received the following chemotherapy agents kyprolis  To help prevent nausea and vomiting after your treatment, we encourage you to take your nausea medication if needed   If you develop nausea and vomiting that is not controlled by your nausea medication, call the clinic.   BELOW ARE SYMPTOMS THAT SHOULD BE REPORTED IMMEDIATELY:  *FEVER GREATER THAN 100.5 F  *CHILLS WITH OR WITHOUT FEVER  NAUSEA AND VOMITING THAT IS NOT CONTROLLED WITH YOUR NAUSEA MEDICATION  *UNUSUAL SHORTNESS OF BREATH  *UNUSUAL BRUISING OR BLEEDING  TENDERNESS IN MOUTH AND THROAT WITH OR WITHOUT PRESENCE OF ULCERS  *URINARY PROBLEMS  *BOWEL PROBLEMS  UNUSUAL RASH Items with * indicate a potential emergency and should be followed up as soon as possible.  Feel free to call the clinic you have any questions or concerns. The clinic phone number is [phone].

## 2014-05-15 NOTE — Telephone Encounter (Signed)
gv and pdrinted appt sched and vs for pt for March and April...sed added tx.

## 2014-05-15 NOTE — Progress Notes (Signed)
  [place] OFFICE PROGRESS NOTE   Diagnosis: Multiple myeloma  INTERVAL HISTORY:   Walter Dennis returns as scheduled. He is due to begin another cycle of Revlimid today. He continues Carfilzomib and Decadron. No change in the neuropathy symptoms at the lower legs and feet. No recent infection. Stable exertional burning at the chest. He reports increased diarrhea and cramping abdominal discomfort since resuming Revlimid.  Objective:  Vital signs in last 24 hours:  Weight 220 lb 8 oz (100.018 kg).    HEENT: No thrush or ulcers Resp: Lungs clear bilaterally Cardio: Regular rate and rhythm GI: No hepatosplenomegaly, nontender Vascular: No leg edema   Portacath/PICC-without erythema  Lab Results:  Lab Results  Component Value Date   WBC 5.0 05/15/2014   HGB 11.4* 05/15/2014   HCT 34.1* 05/15/2014   MCV 87.9 05/15/2014   PLT 233 05/15/2014   NEUTROABS 4.0 05/15/2014   04/17/2014: Serum M spike 2.47, lambda free light chains 74.2  Medications: I have reviewed the patient's current medications.  Assessment/Plan: 1.Multiple myeloma, IgG lambda monoclonal protein  -Initial diagnosis 2008, bone marrow with a 40-50% plasma cells  -Lenalidomide 25 mg,day 1-21 and Decadron 40 mg weekly and 2585, complicated by peripheral neuropathy  -Therapy change to bortezomib IV twice weekly plus Decadron  -High-dose chemotherapy with autologous stem cell transplantation 2009 at [facility]  -Bortezomib, cyclophosphamide, and Decadron December 2012 through February 2014 (discontinued secondary to pulmonary aspergillosis)  -January 2014 M spike rise to 1.1 g/dL from 0.1 g/dL in December 2013. Treatment started with Carlfilzomib, lenalidomide, and Decadron.  -treatment was restarted with Carlfilzomib, lenalidomide, and Decadron 08/16/2012.  -Restaging labs on 10/15/2012 consistent with improvement in the serum M. Protein.  -Continuation of Carfilzomib/Revlimid/dexamethasone.   -Continued improvement in the serum M spike and IgG level on 11/29/2012.  -Improvement in the serum M spike, slight increase in IgG on 01/03/2013.  -Improvement in the serum M spike, stable IgG, stable lambda free light chains 01/31/2013  -The IgG and serum M spike were slightly higher 02/28/2013, stable lambda light chains  -IgG, M spike, and lambda light chains slightly lower on 04/04/2013.  -Continuation of Carfilzomib/Revlimid/dexamethasone.  -IgG, M spike and lambda light chains stable 05/02/2013.  -IgG, serum M spike, and lambda free light chains stable on 07/11/2013; 08/08/2013.  -IgG, serum M spike, and lambda light chains higher 10/17/2013 after being off treatment during July 2015  - Carfilzomib/decadron continued with a cycle starting 10/17/2013  -Increase in serum M spike, light chains and IgG 02/13/2014. - Carfilzomib/ Decadron continued, Revlimid resumed. 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 He has been unable to lower the Duragesic patch dose  4. Irritable bowel syndrome  5. Hypertension  6. "Chest pain " and dyspnea with exertion ,evaluated by cardiology, . Normal stress nuclear study 11/07/2012 and 08/07/2013.  7. history of Diarrhea. Most likely related to Revlimid  8. Febrile illness 12/05/2013 with associated diffuse bone pain-no source for infection identified   Disposition:  He appears stable. The serum light chains were slightly increased last month. We will follow-up on the light chains, IgG, and M spike from today. The plan is to continue Revlimid/Decadron/ Carfilzomib until there is clear evidence of disease progression. We discussed some of the new salvage treatment options including pomalidomide, and monoclonal antibody therapy.  Betsy Coder, MD  05/15/2014  12:16 PM

## 2014-05-15 NOTE — Telephone Encounter (Signed)
S/w pt confirming missed labs/flush from 03/01 pt is on his way, he's at [facility] for a drug test and is advising them that he has to be over here for his added apts.Marland Kitchen... KJ

## 2014-05-16 ENCOUNTER — Ambulatory Visit (HOSPITAL_BASED_OUTPATIENT_CLINIC_OR_DEPARTMENT_OTHER): Payer: BLUE CROSS/BLUE SHIELD

## 2014-05-16 DIAGNOSIS — Z5112 Encounter for antineoplastic immunotherapy: Secondary | ICD-10-CM

## 2014-05-16 DIAGNOSIS — C9000 Multiple myeloma not having achieved remission: Secondary | ICD-10-CM

## 2014-05-16 MED ORDER — ONDANSETRON 8 MG/NS 50 ML IVPB
INTRAVENOUS | Status: AC
  Filled 2014-05-16: qty 8

## 2014-05-16 MED ORDER — HEPARIN SOD (PORK) LOCK FLUSH 100 UNIT/ML IV SOLN
500.0000 [IU] | Freq: Once | INTRAVENOUS | Status: AC | PRN
  Administered 2014-05-16: 500 [IU]
  Filled 2014-05-16: qty 5

## 2014-05-16 MED ORDER — SODIUM CHLORIDE 0.9 % IJ SOLN
10.0000 mL | INTRAMUSCULAR | Status: DC | PRN
  Administered 2014-05-16: 10 mL
  Filled 2014-05-16: qty 10

## 2014-05-16 MED ORDER — SODIUM CHLORIDE 0.9 % IV SOLN
Freq: Once | INTRAVENOUS | Status: AC
Start: 2014-05-16 — End: 2014-05-16
  Administered 2014-05-16: 16:00:00 via INTRAVENOUS

## 2014-05-16 MED ORDER — ONDANSETRON 8 MG/50ML IVPB (CHCC)
8.0000 mg | Freq: Once | INTRAVENOUS | Status: AC
  Administered 2014-05-16: 8 mg via INTRAVENOUS

## 2014-05-16 MED ORDER — DEXTROSE 5 % IV SOLN
27.0000 mg/m2 | Freq: Once | INTRAVENOUS | Status: AC
  Administered 2014-05-16: 60 mg via INTRAVENOUS
  Filled 2014-05-16: qty 30

## 2014-05-16 NOTE — Patient Instructions (Signed)
Carfilzomib injection What is this medicine? CARFILZOMIB (kar FILZ oh mib) is a chemotherapy drug that works by slowing or stopping cancer cell growth. This medicine is used to treat multiple myeloma. This medicine may be used for other purposes; ask your health care provider or pharmacist if you have questions. COMMON BRAND NAME(S): KYPROLIS What should I tell my health care provider before I take this medicine? They need to know if you have any of these conditions: -heart disease -irregular heartbeat -liver disease -lung or breathing disease -an unusual or allergic reaction to carfilzomib, or other medicines, foods, dyes, or preservatives -pregnant or trying to get pregnant -breast-feeding How should I use this medicine? This medicine is for injection or infusion into a vein. It is given by a health care professional in a hospital or clinic setting. Talk to your pediatrician regarding the use of this medicine in children. Special care may be needed. Overdosage: If you think you've taken too much of this medicine contact a poison control center or emergency room at once. Overdosage: If you think you have taken too much of this medicine contact a poison control center or emergency room at once. NOTE: This medicine is only for you. Do not share this medicine with others. What if I miss a dose? It is important not to miss your dose. Call your doctor or health care professional if you are unable to keep an appointment. What may interact with this medicine? Interactions are not expected. Give your health care provider a list of all the medicines, herbs, non-prescription drugs, or dietary supplements you use. Also tell them if you smoke, drink alcohol, or use illegal drugs. Some items may interact with your medicine. This list may not describe all possible interactions. Give your health care provider a list of all the medicines, herbs, non-prescription drugs, or dietary supplements you use. Also  tell them if you smoke, drink alcohol, or use illegal drugs. Some items may interact with your medicine. What should I watch for while using this medicine? Your condition will be monitored carefully while you are receiving this medicine. Report any side effects. Continue your course of treatment even though you feel ill unless your doctor tells you to stop. Call your doctor or health care professional for advice if you get a fever, chills or sore throat, or other symptoms of a cold or flu. Do not treat yourself. Try to avoid being around people who are sick. Do not become pregnant while taking this medicine. Women should inform their doctor if they wish to become pregnant or think they might be pregnant. There is a potential for serious side effects to an unborn child. Talk to your health care professional or pharmacist for more information. Do not breast-feed an infant while taking this medicine. Check with your doctor or health care professional if you get an attack of severe diarrhea, nausea and vomiting, or if you sweat a lot. The loss of too much body fluid can make it dangerous for you to take this medicine. You may get dizzy. Do not drive, use machinery, or do anything that needs mental alertness until you know how this medicine affects you. Do not stand or sit up quickly, especially if you are an older patient. This reduces the risk of dizzy or fainting spells. What side effects may I notice from receiving this medicine? Side effects that you should report to your doctor or health care professional as soon as possible: -allergic reactions like skin rash, itching or hives,  swelling of the face, lips, or tongue -breathing problems -chest pain or palpitationschest tightness -cough -dark urine -dizziness -feeling faint or lightheaded -fever or chills -general ill feeling or flu-like symptoms -light-colored stools -palpitations -right upper belly pain -swelling of the legs or ankles -unusual  bleeding or bruising -unusually weak or tired -yellowing of the eyes or skin Side effects that usually do not require medical attention (Report these to your doctor or health care professional if they continue or are bothersome.): -diarrhea -headache -nausea, vomiting -tiredness This list may not describe all possible side effects. Call your doctor for medical advice about side effects. You may report side effects to FDA at 1-800-FDA-1088. Where should I keep my medicine? This drug is given in a hospital or clinic and will not be stored at home. NOTE: This sheet is a summary. It may not cover all possible information. If you have questions about this medicine, talk to your doctor, pharmacist, or health care provider.  2015, [employer]. (2011-08-19 17:02:29)

## 2014-05-19 LAB — PROTEIN ELECTROPHORESIS, SERUM
Albumin ELP: 46.3 % — ABNORMAL LOW (ref 55.8–66.1)
Alpha-1-Globulin: 3.1 % (ref 2.9–4.9)
Alpha-2-Globulin: 6.4 % — ABNORMAL LOW (ref 7.1–11.8)
Beta 2: 38.1 % — ABNORMAL HIGH (ref 3.2–6.5)
Beta Globulin: 4.9 % (ref 4.7–7.2)
GAMMA GLOBULIN: 1.2 % — AB (ref 11.1–18.8)
M-SPIKE, %: 2.69 g/dL
Total Protein, Serum Electrophoresis: 8.8 g/dL — ABNORMAL HIGH (ref 6.0–8.3)

## 2014-05-19 LAB — KAPPA/LAMBDA LIGHT CHAINS
KAPPA FREE LGHT CHN: 0.16 mg/dL — AB (ref 0.33–1.94)
KAPPA LAMBDA RATIO: 0 — AB (ref 0.26–1.65)
Lambda Free Lght Chn: 65.1 mg/dL — ABNORMAL HIGH (ref 0.57–2.63)

## 2014-05-21 ENCOUNTER — Telehealth: Payer: Self-pay | Admitting: *Deleted

## 2014-05-21 NOTE — Telephone Encounter (Signed)
-----   Message from Ladell Pier, MD sent at 05/20/2014  7:31 PM EST ----- Please call patient, light chains slightly better, m-spike mildly higher, continue current therapy, f/u as scheduled

## 2014-05-21 NOTE — Telephone Encounter (Signed)
Called pt with M-spike and light chains results. He voiced understanding, reports increased pain and severe malaise and fatigue over the weekend. Better today. Instructed pt to call office if this happens again.

## 2014-05-22 ENCOUNTER — Ambulatory Visit (HOSPITAL_BASED_OUTPATIENT_CLINIC_OR_DEPARTMENT_OTHER): Payer: BLUE CROSS/BLUE SHIELD

## 2014-05-22 ENCOUNTER — Ambulatory Visit: Payer: BLUE CROSS/BLUE SHIELD

## 2014-05-22 ENCOUNTER — Other Ambulatory Visit: Payer: BLUE CROSS/BLUE SHIELD

## 2014-05-22 ENCOUNTER — Other Ambulatory Visit (HOSPITAL_BASED_OUTPATIENT_CLINIC_OR_DEPARTMENT_OTHER): Payer: BLUE CROSS/BLUE SHIELD

## 2014-05-22 DIAGNOSIS — Z5112 Encounter for antineoplastic immunotherapy: Secondary | ICD-10-CM

## 2014-05-22 DIAGNOSIS — C9000 Multiple myeloma not having achieved remission: Secondary | ICD-10-CM

## 2014-05-22 DIAGNOSIS — Z95828 Presence of other vascular implants and grafts: Secondary | ICD-10-CM

## 2014-05-22 LAB — CBC WITH DIFFERENTIAL/PLATELET
BASO%: 0.7 % (ref 0.0–2.0)
Basophils Absolute: 0 10*3/uL (ref 0.0–0.1)
EOS%: 0.7 % (ref 0.0–7.0)
Eosinophils Absolute: 0 10*3/uL (ref 0.0–0.5)
HCT: 33.6 % — ABNORMAL LOW (ref 38.4–49.9)
HGB: 10.9 g/dL — ABNORMAL LOW (ref 13.0–17.1)
LYMPH#: 0.7 10*3/uL — AB (ref 0.9–3.3)
LYMPH%: 10.6 % — ABNORMAL LOW (ref 14.0–49.0)
MCH: 28.8 pg (ref 27.2–33.4)
MCHC: 32.3 g/dL (ref 32.0–36.0)
MCV: 88.9 fL (ref 79.3–98.0)
MONO#: 0.1 10*3/uL (ref 0.1–0.9)
MONO%: 1.9 % (ref 0.0–14.0)
NEUT#: 5.6 10*3/uL (ref 1.5–6.5)
NEUT%: 86.1 % — ABNORMAL HIGH (ref 39.0–75.0)
Platelets: 226 10*3/uL (ref 140–400)
RBC: 3.78 10*6/uL — AB (ref 4.20–5.82)
RDW: 17.4 % — ABNORMAL HIGH (ref 11.0–14.6)
WBC: 6.5 10*3/uL (ref 4.0–10.3)

## 2014-05-22 MED ORDER — SODIUM CHLORIDE 0.9 % IJ SOLN
10.0000 mL | INTRAMUSCULAR | Status: DC | PRN
  Administered 2014-05-22: 10 mL via INTRAVENOUS
  Filled 2014-05-22: qty 10

## 2014-05-22 MED ORDER — SODIUM CHLORIDE 0.9 % IV SOLN
Freq: Once | INTRAVENOUS | Status: AC
  Administered 2014-05-22: 16:00:00 via INTRAVENOUS
  Filled 2014-05-22: qty 4

## 2014-05-22 MED ORDER — CARFILZOMIB CHEMO INJECTION 60 MG
27.0000 mg/m2 | Freq: Once | INTRAVENOUS | Status: AC
  Administered 2014-05-22: 60 mg via INTRAVENOUS
  Filled 2014-05-22: qty 30

## 2014-05-22 MED ORDER — HEPARIN SOD (PORK) LOCK FLUSH 100 UNIT/ML IV SOLN
500.0000 [IU] | Freq: Once | INTRAVENOUS | Status: AC | PRN
  Administered 2014-05-22: 500 [IU]
  Filled 2014-05-22: qty 5

## 2014-05-22 MED ORDER — SODIUM CHLORIDE 0.9 % IV SOLN
Freq: Once | INTRAVENOUS | Status: AC
  Administered 2014-05-22: 16:00:00 via INTRAVENOUS

## 2014-05-22 MED ORDER — SODIUM CHLORIDE 0.9 % IJ SOLN
10.0000 mL | INTRAMUSCULAR | Status: DC | PRN
  Administered 2014-05-22: 10 mL
  Filled 2014-05-22: qty 10

## 2014-05-22 NOTE — Progress Notes (Signed)
Dr. Benay Spice notified of pt reporting worsening fatigue, diarrhea, heart racing and pain all over x 6 days. States he is taking imodium with some relief.  Okay to proceed with treatment today per Dr. Benay Spice and pt instructed that he could take up to 8 pills of the imodium per day and if no improvement or worsening in symptoms he should call us.  Pt verbalized understanding.

## 2014-05-22 NOTE — Patient Instructions (Signed)
[facility] Discharge Instructions for Patients Receiving Chemotherapy  Today you received the following chemotherapy agents Kyprolis To help prevent nausea and vomiting after your treatment, we encourage you to take your nausea medication as prescribed.  If you develop nausea and vomiting that is not controlled by your nausea medication, call the clinic.   BELOW ARE SYMPTOMS THAT SHOULD BE REPORTED IMMEDIATELY:  *FEVER GREATER THAN 100.5 F  *CHILLS WITH OR WITHOUT FEVER  NAUSEA AND VOMITING THAT IS NOT CONTROLLED WITH YOUR NAUSEA MEDICATION  *UNUSUAL SHORTNESS OF BREATH  *UNUSUAL BRUISING OR BLEEDING  TENDERNESS IN MOUTH AND THROAT WITH OR WITHOUT PRESENCE OF ULCERS  *URINARY PROBLEMS  *BOWEL PROBLEMS  UNUSUAL RASH Items with * indicate a potential emergency and should be followed up as soon as possible.  Feel free to call the clinic you have any questions or concerns. The clinic phone number is [phone].    

## 2014-05-22 NOTE — Patient Instructions (Signed)

## 2014-05-23 ENCOUNTER — Ambulatory Visit (HOSPITAL_BASED_OUTPATIENT_CLINIC_OR_DEPARTMENT_OTHER): Payer: BLUE CROSS/BLUE SHIELD

## 2014-05-23 DIAGNOSIS — C9000 Multiple myeloma not having achieved remission: Secondary | ICD-10-CM

## 2014-05-23 DIAGNOSIS — Z5112 Encounter for antineoplastic immunotherapy: Secondary | ICD-10-CM

## 2014-05-23 MED ORDER — SODIUM CHLORIDE 0.9 % IJ SOLN
10.0000 mL | INTRAMUSCULAR | Status: DC | PRN
  Administered 2014-05-23: 10 mL
  Filled 2014-05-23: qty 10

## 2014-05-23 MED ORDER — SODIUM CHLORIDE 0.9 % IV SOLN
Freq: Once | INTRAVENOUS | Status: AC
  Administered 2014-05-23: 16:00:00 via INTRAVENOUS
  Filled 2014-05-23: qty 4

## 2014-05-23 MED ORDER — DEXTROSE 5 % IV SOLN
27.0000 mg/m2 | Freq: Once | INTRAVENOUS | Status: AC
  Administered 2014-05-23: 60 mg via INTRAVENOUS
  Filled 2014-05-23: qty 30

## 2014-05-23 MED ORDER — SODIUM CHLORIDE 0.9 % IV SOLN
Freq: Once | INTRAVENOUS | Status: AC
  Administered 2014-05-23: 16:00:00 via INTRAVENOUS

## 2014-05-23 MED ORDER — HEPARIN SOD (PORK) LOCK FLUSH 100 UNIT/ML IV SOLN
500.0000 [IU] | Freq: Once | INTRAVENOUS | Status: AC | PRN
  Administered 2014-05-23: 500 [IU]
  Filled 2014-05-23: qty 5

## 2014-05-23 NOTE — Patient Instructions (Signed)
[facility] Discharge Instructions for Patients Receiving Chemotherapy  Today you received the following chemotherapy agents:  Kyprolis  To help prevent nausea and vomiting after your treatment, we encourage you to take your nausea medication as ordered per MD.   If you develop nausea and vomiting that is not controlled by your nausea medication, call the clinic.   BELOW ARE SYMPTOMS THAT SHOULD BE REPORTED IMMEDIATELY:  *FEVER GREATER THAN 100.5 F  *CHILLS WITH OR WITHOUT FEVER  NAUSEA AND VOMITING THAT IS NOT CONTROLLED WITH YOUR NAUSEA MEDICATION  *UNUSUAL SHORTNESS OF BREATH  *UNUSUAL BRUISING OR BLEEDING  TENDERNESS IN MOUTH AND THROAT WITH OR WITHOUT PRESENCE OF ULCERS  *URINARY PROBLEMS  *BOWEL PROBLEMS  UNUSUAL RASH Items with * indicate a potential emergency and should be followed up as soon as possible.  Feel free to call the clinic you have any questions or concerns. The clinic phone number is [phone].

## 2014-05-23 NOTE — Progress Notes (Signed)
1545-Pt states 5 loose stools today and is requesting additional IV fluids.  Arvid Right NP to infusion room to assess pt.  Pt ordered to take Imodium 2 after next loose stool and then 1 after each loose stool.  Also instructed to increase PO intake.  Teach back done.  Pt with no questions at this time.

## 2014-05-29 ENCOUNTER — Ambulatory Visit: Payer: BLUE CROSS/BLUE SHIELD

## 2014-05-29 ENCOUNTER — Other Ambulatory Visit: Payer: BLUE CROSS/BLUE SHIELD

## 2014-05-30 ENCOUNTER — Ambulatory Visit: Payer: BLUE CROSS/BLUE SHIELD

## 2014-05-30 ENCOUNTER — Other Ambulatory Visit (HOSPITAL_BASED_OUTPATIENT_CLINIC_OR_DEPARTMENT_OTHER): Payer: BLUE CROSS/BLUE SHIELD

## 2014-05-30 VITALS — BP 127/56 | HR 65 | Temp 98.3°F

## 2014-05-30 DIAGNOSIS — C9000 Multiple myeloma not having achieved remission: Secondary | ICD-10-CM

## 2014-05-30 DIAGNOSIS — N08 Glomerular disorders in diseases classified elsewhere: Principal | ICD-10-CM

## 2014-05-30 LAB — CBC WITH DIFFERENTIAL/PLATELET
BASO%: 0.2 % (ref 0.0–2.0)
Basophils Absolute: 0 10*3/uL (ref 0.0–0.1)
EOS ABS: 0 10*3/uL (ref 0.0–0.5)
EOS%: 0.1 % (ref 0.0–7.0)
HEMATOCRIT: 33.2 % — AB (ref 38.4–49.9)
HGB: 10.9 g/dL — ABNORMAL LOW (ref 13.0–17.1)
LYMPH%: 10.7 % — AB (ref 14.0–49.0)
MCH: 29.5 pg (ref 27.2–33.4)
MCHC: 32.8 g/dL (ref 32.0–36.0)
MCV: 89.7 fL (ref 79.3–98.0)
MONO#: 0.7 10*3/uL (ref 0.1–0.9)
MONO%: 7.3 % (ref 0.0–14.0)
NEUT%: 81.7 % — AB (ref 39.0–75.0)
NEUTROS ABS: 8 10*3/uL — AB (ref 1.5–6.5)
PLATELETS: 234 10*3/uL (ref 140–400)
RBC: 3.7 10*6/uL — AB (ref 4.20–5.82)
RDW: 16.7 % — ABNORMAL HIGH (ref 11.0–14.6)
WBC: 9.8 10*3/uL (ref 4.0–10.3)
lymph#: 1.1 10*3/uL (ref 0.9–3.3)

## 2014-05-30 MED ORDER — FENTANYL 25 MCG/HR TD PT72: 25.0000 ug | MEDICATED_PATCH | TRANSDERMAL | Status: DC

## 2014-05-30 NOTE — Progress Notes (Signed)
Patient complains of seven episodes of diarrhea. Patient denies nausea, vomiting or dizziness. Patient states he is taking imodium and lomotil as ordered. Dr. Benay Spice notified. Hold treatment, stop revlimid, increase fluid intake, continue imodium and lomotil and call our office if diarrhea persists or becomes worse. Patient verbalized understanding to above plan. Patient discharged ambulatory in no acute distress.

## 2014-06-08 ENCOUNTER — Other Ambulatory Visit: Payer: Self-pay | Admitting: Oncology

## 2014-06-12 ENCOUNTER — Other Ambulatory Visit (HOSPITAL_BASED_OUTPATIENT_CLINIC_OR_DEPARTMENT_OTHER): Payer: BLUE CROSS/BLUE SHIELD

## 2014-06-12 ENCOUNTER — Ambulatory Visit (HOSPITAL_BASED_OUTPATIENT_CLINIC_OR_DEPARTMENT_OTHER): Payer: BLUE CROSS/BLUE SHIELD | Admitting: Oncology

## 2014-06-12 ENCOUNTER — Ambulatory Visit: Payer: BLUE CROSS/BLUE SHIELD

## 2014-06-12 ENCOUNTER — Ambulatory Visit (HOSPITAL_BASED_OUTPATIENT_CLINIC_OR_DEPARTMENT_OTHER): Payer: BLUE CROSS/BLUE SHIELD

## 2014-06-12 VITALS — BP 139/84 | HR 110 | Temp 99.3°F | Resp 20 | Ht 72.0 in | Wt 221.5 lb

## 2014-06-12 DIAGNOSIS — Z95828 Presence of other vascular implants and grafts: Secondary | ICD-10-CM

## 2014-06-12 DIAGNOSIS — Z5112 Encounter for antineoplastic immunotherapy: Secondary | ICD-10-CM

## 2014-06-12 DIAGNOSIS — C9000 Multiple myeloma not having achieved remission: Secondary | ICD-10-CM

## 2014-06-12 LAB — CBC WITH DIFFERENTIAL/PLATELET
BASO%: 0.9 % (ref 0.0–2.0)
BASOS ABS: 0.1 10*3/uL (ref 0.0–0.1)
EOS ABS: 0.1 10*3/uL (ref 0.0–0.5)
EOS%: 1.1 % (ref 0.0–7.0)
HEMATOCRIT: 32 % — AB (ref 38.4–49.9)
HEMOGLOBIN: 10.6 g/dL — AB (ref 13.0–17.1)
LYMPH#: 0.7 10*3/uL — AB (ref 0.9–3.3)
LYMPH%: 13.3 % — ABNORMAL LOW (ref 14.0–49.0)
MCH: 29.7 pg (ref 27.2–33.4)
MCHC: 33.1 g/dL (ref 32.0–36.0)
MCV: 89.6 fL (ref 79.3–98.0)
MONO#: 0.1 10*3/uL (ref 0.1–0.9)
MONO%: 0.9 % (ref 0.0–14.0)
NEUT%: 83.8 % — ABNORMAL HIGH (ref 39.0–75.0)
NEUTROS ABS: 4.7 10*3/uL (ref 1.5–6.5)
Platelets: 208 10*3/uL (ref 140–400)
RBC: 3.57 10*6/uL — ABNORMAL LOW (ref 4.20–5.82)
RDW: 16.3 % — AB (ref 11.0–14.6)
WBC: 5.6 10*3/uL (ref 4.0–10.3)
nRBC: 1 % — ABNORMAL HIGH (ref 0–0)

## 2014-06-12 LAB — COMPREHENSIVE METABOLIC PANEL (CC13)
ALK PHOS: 64 U/L (ref 40–150)
ALT: 37 U/L (ref 0–55)
AST: 22 U/L (ref 5–34)
Albumin: 3.4 g/dL — ABNORMAL LOW (ref 3.5–5.0)
Anion Gap: 16 mEq/L — ABNORMAL HIGH (ref 3–11)
BUN: 13.1 mg/dL (ref 7.0–26.0)
CALCIUM: 9.2 mg/dL (ref 8.4–10.4)
CHLORIDE: 105 meq/L (ref 98–109)
CO2: 20 mEq/L — ABNORMAL LOW (ref 22–29)
Creatinine: 1.3 mg/dL (ref 0.7–1.3)
EGFR: 56 mL/min/{1.73_m2} — ABNORMAL LOW (ref >90)
Glucose: 240 mg/dl — ABNORMAL HIGH (ref 70–140)
Potassium: 4.2 mEq/L (ref 3.5–5.1)
SODIUM: 141 meq/L (ref 136–145)
Total Bilirubin: 0.69 mg/dL (ref 0.20–1.20)
Total Protein: 9.1 g/dL — ABNORMAL HIGH (ref 6.4–8.3)

## 2014-06-12 MED ORDER — HEPARIN SOD (PORK) LOCK FLUSH 100 UNIT/ML IV SOLN
500.0000 [IU] | Freq: Once | INTRAVENOUS | Status: DC | PRN
  Filled 2014-06-12: qty 5

## 2014-06-12 MED ORDER — DEXTROSE 5 % IV SOLN
27.0000 mg/m2 | Freq: Once | INTRAVENOUS | Status: AC
  Administered 2014-06-12: 60 mg via INTRAVENOUS
  Filled 2014-06-12: qty 30

## 2014-06-12 MED ORDER — SODIUM CHLORIDE 0.9 % IJ SOLN
10.0000 mL | INTRAMUSCULAR | Status: DC | PRN
  Filled 2014-06-12: qty 10

## 2014-06-12 MED ORDER — SODIUM CHLORIDE 0.9 % IV SOLN
Freq: Once | INTRAVENOUS | Status: AC
  Administered 2014-06-12: 17:00:00 via INTRAVENOUS
  Filled 2014-06-12: qty 4

## 2014-06-12 MED ORDER — SODIUM CHLORIDE 0.9 % IJ SOLN
10.0000 mL | INTRAMUSCULAR | Status: DC | PRN
  Administered 2014-06-12: 10 mL via INTRAVENOUS
  Filled 2014-06-12: qty 10

## 2014-06-12 MED ORDER — SODIUM CHLORIDE 0.9 % IV SOLN
Freq: Once | INTRAVENOUS | Status: AC
  Administered 2014-06-12: 16:00:00 via INTRAVENOUS

## 2014-06-12 NOTE — Patient Instructions (Signed)

## 2014-06-12 NOTE — Progress Notes (Signed)
[place] OFFICE PROGRESS NOTE   Diagnosis: Multiple myeloma  INTERVAL HISTORY:   Walter Dennis returns as scheduled. He began another cycle of Carfilzomib/Revlimid/Decadron on 05/15/2014. Revlimid was discontinued 05/30/2014 secondary to increased diarrhea. The diarrhea improves when he takes Imodium. He is probably taking Imodium twice daily. He continues to work. He reports malaise toward the end of her workday. No fever. The foot pain has improved.  Objective:  Vital signs in last 24 hours:  Blood pressure 139/84, pulse 110, temperature 99.3 F (37.4 C), temperature source Oral, resp. rate 20, height 6' (1.829 m), weight 221 lb 8 oz (100.472 kg), SpO2 99 %.    HEENT: No thrush or ulcers Resp: Lungs clear bilaterally Cardio: Regular rate and rhythm, tachycardia GI: No hepatomegaly, nontender Vascular: No leg edema   Portacath/PICC-without erythema  Lab Results:  Lab Results  Component Value Date   WBC 5.6 06/12/2014   HGB 10.6* 06/12/2014   HCT 32.0* 06/12/2014   MCV 89.6 06/12/2014   PLT 208 06/12/2014   NEUTROABS 4.7 06/12/2014    05/15/2014-lambda free light chain 65.1, serum M spike 2.69 Medications: I have reviewed the patient's current medications.  Assessment/Plan: 1.Multiple myeloma, IgG lambda monoclonal protein  -Initial diagnosis 2008, bone marrow with a 40-50% plasma cells  -Lenalidomide 25 mg,day 1-21 and Decadron 40 mg weekly and 0258, complicated by peripheral neuropathy  -Therapy change to bortezomib IV twice weekly plus Decadron  -High-dose chemotherapy with autologous stem cell transplantation 2009 at [facility]  -Bortezomib, cyclophosphamide, and Decadron December 2012 through February 2014 (discontinued secondary to pulmonary aspergillosis)  -January 2014 M spike rise to 1.1 g/dL from 0.1 g/dL in December 2013. Treatment started with Carlfilzomib, lenalidomide, and Decadron.  -treatment was restarted with Carlfilzomib,  lenalidomide, and Decadron 08/16/2012.  -Restaging labs on 10/15/2012 consistent with improvement in the serum M. Protein.  -Continuation of Carfilzomib/Revlimid/dexamethasone.  -Continued improvement in the serum M spike and IgG level on 11/29/2012.  -Improvement in the serum M spike, slight increase in IgG on 01/03/2013.  -Improvement in the serum M spike, stable IgG, stable lambda free light chains 01/31/2013  -The IgG and serum M spike were slightly higher 02/28/2013, stable lambda light chains  -IgG, M spike, and lambda light chains slightly lower on 04/04/2013.  -Continuation of Carfilzomib/Revlimid/dexamethasone.  -IgG, M spike and lambda light chains stable 05/02/2013.  -IgG, serum M spike, and lambda free light chains stable on 07/11/2013; 08/08/2013.  -IgG, serum M spike, and lambda light chains higher 10/17/2013 after being off treatment during July 2015  - Carfilzomib/decadron continued with a cycle starting 10/17/2013  -Increase in serum M spike, light chains and IgG 02/13/2014. - Carfilzomib/ Decadron continued, Revlimid resumed. 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 He has been unable to lower the Duragesic patch dose  4. Irritable bowel syndrome  5. Hypertension  6. "Chest pain " and dyspnea with exertion ,evaluated by cardiology, . Normal stress nuclear study 11/07/2012 and 08/07/2013.  7. history of Diarrhea. Most likely related to Revlimid, improved with Imodium 8. Febrile illness 12/05/2013 with associated diffuse bone pain-no source for infection identified     Disposition:  He appears stable. He will begin another cycle of Revlimid/Decadron/ carfilzomib today. The plan is to continue this regimen until there was clear evidence of disease progression. He will increase the use of Imodium for diarrhea. Walter Dennis will contact us if this is not helpful.  He will return for an office visit  in  one month.  Betsy Coder, MD  06/12/2014  4:18 PM

## 2014-06-13 ENCOUNTER — Ambulatory Visit (HOSPITAL_BASED_OUTPATIENT_CLINIC_OR_DEPARTMENT_OTHER): Payer: BLUE CROSS/BLUE SHIELD

## 2014-06-13 ENCOUNTER — Telehealth: Payer: Self-pay | Admitting: *Deleted

## 2014-06-13 DIAGNOSIS — Z5112 Encounter for antineoplastic immunotherapy: Secondary | ICD-10-CM | POA: Diagnosis not present

## 2014-06-13 DIAGNOSIS — C9000 Multiple myeloma not having achieved remission: Secondary | ICD-10-CM

## 2014-06-13 MED ORDER — HEPARIN SOD (PORK) LOCK FLUSH 100 UNIT/ML IV SOLN
500.0000 [IU] | Freq: Once | INTRAVENOUS | Status: AC | PRN
  Administered 2014-06-13: 500 [IU]
  Filled 2014-06-13: qty 5

## 2014-06-13 MED ORDER — SODIUM CHLORIDE 0.9 % IV SOLN
Freq: Once | INTRAVENOUS | Status: AC
  Administered 2014-06-13: 16:00:00 via INTRAVENOUS

## 2014-06-13 MED ORDER — SODIUM CHLORIDE 0.9 % IV SOLN
Freq: Once | INTRAVENOUS | Status: AC
  Administered 2014-06-13: 16:00:00 via INTRAVENOUS
  Filled 2014-06-13: qty 4

## 2014-06-13 MED ORDER — DEXTROSE 5 % IV SOLN
27.0000 mg/m2 | Freq: Once | INTRAVENOUS | Status: AC
  Administered 2014-06-13: 60 mg via INTRAVENOUS
  Filled 2014-06-13: qty 30

## 2014-06-13 MED ORDER — SODIUM CHLORIDE 0.9 % IJ SOLN
10.0000 mL | INTRAMUSCULAR | Status: DC | PRN
  Administered 2014-06-13: 10 mL
  Filled 2014-06-13: qty 10

## 2014-06-13 NOTE — Telephone Encounter (Signed)
Per staff message and POF I have scheduled appts. Advised scheduler of appts. JMW  

## 2014-06-13 NOTE — Patient Instructions (Signed)
[facility] Discharge Instructions for Patients Receiving Chemotherapy  Today you received the following chemotherapy agents:  Kyprolis  To help prevent nausea and vomiting after your treatment, we encourage you to take your nausea medication as ordered per MD.   If you develop nausea and vomiting that is not controlled by your nausea medication, call the clinic.   BELOW ARE SYMPTOMS THAT SHOULD BE REPORTED IMMEDIATELY:  *FEVER GREATER THAN 100.5 F  *CHILLS WITH OR WITHOUT FEVER  NAUSEA AND VOMITING THAT IS NOT CONTROLLED WITH YOUR NAUSEA MEDICATION  *UNUSUAL SHORTNESS OF BREATH  *UNUSUAL BRUISING OR BLEEDING  TENDERNESS IN MOUTH AND THROAT WITH OR WITHOUT PRESENCE OF ULCERS  *URINARY PROBLEMS  *BOWEL PROBLEMS  UNUSUAL RASH Items with * indicate a potential emergency and should be followed up as soon as possible.  Feel free to call the clinic you have any questions or concerns. The clinic phone number is [phone].

## 2014-06-15 ENCOUNTER — Other Ambulatory Visit: Payer: Self-pay | Admitting: Oncology

## 2014-06-17 ENCOUNTER — Telehealth: Payer: Self-pay | Admitting: Oncology

## 2014-06-17 NOTE — Telephone Encounter (Signed)
S/w pt confirming labs/flush/ov per 03/31 POF, sent msg on 03/31 to add chemo, this was done and pt confirmed..... KJ Advised pt to p/u schedule at next visit

## 2014-06-18 LAB — PROTEIN ELECTROPHORESIS, SERUM
ABNORMAL PROTEIN BAND1: 3.4 g/dL
ALPHA-2-GLOBULIN: 0.3 g/dL — AB (ref 0.5–0.9)
Abnormal Protein Band2: NOT DETECTED g/dL
Abnormal Protein Band3: NOT DETECTED g/dL
Albumin ELP: 4 g/dL (ref 3.8–4.8)
Alpha-1-Globulin: 0.3 g/dL (ref 0.2–0.3)
Beta 2: 3.8 g/dL — ABNORMAL HIGH (ref 0.2–0.5)
Beta Globulin: 0.8 g/dL — ABNORMAL HIGH (ref 0.4–0.6)
Gamma Globulin: 0.1 g/dL — ABNORMAL LOW (ref 0.8–1.7)
TOTAL PROTEIN, SERUM ELECTROPHOR: 9.3 g/dL — AB (ref 6.1–8.1)

## 2014-06-18 LAB — KAPPA/LAMBDA LIGHT CHAINS
Kappa free light chain: <0.03 mg/dL — ABNORMAL LOW (ref 0.33–1.94)
Kappa:Lambda Ratio: 0 — ABNORMAL LOW (ref 0.26–1.65)
Lambda Free Lght Chn: 81.2 mg/dL — ABNORMAL HIGH (ref 0.57–2.63)

## 2014-06-18 LAB — IGG: IGG (IMMUNOGLOBIN G), SERUM: 3670 mg/dL — AB (ref 650–1600)

## 2014-06-19 ENCOUNTER — Telehealth: Payer: Self-pay | Admitting: Oncology

## 2014-06-19 ENCOUNTER — Telehealth: Payer: Self-pay | Admitting: *Deleted

## 2014-06-19 ENCOUNTER — Other Ambulatory Visit: Payer: BLUE CROSS/BLUE SHIELD

## 2014-06-19 ENCOUNTER — Ambulatory Visit: Payer: BLUE CROSS/BLUE SHIELD

## 2014-06-19 ENCOUNTER — Other Ambulatory Visit: Payer: Self-pay | Admitting: Oncology

## 2014-06-19 NOTE — Telephone Encounter (Incomplete)
S/w pt

## 2014-06-19 NOTE — Telephone Encounter (Signed)
-----   Message from Ladell Pier, MD sent at 06/19/2014  7:33 AM EDT ----- Please call patient, labs indicate myeloma is progressing, we need to change therapy, cancel carfilzomib Schedule office [facility] next 1 week to discuss options

## 2014-06-19 NOTE — Telephone Encounter (Signed)
Informed pt that labs indicate myeloma is progressing. Need to change therapy. Chemo appointments canceled for this week. Pt voiced understanding. Schedulers will call with new appointment.

## 2014-06-20 ENCOUNTER — Other Ambulatory Visit: Payer: Self-pay | Admitting: Oncology

## 2014-06-20 ENCOUNTER — Ambulatory Visit: Payer: BLUE CROSS/BLUE SHIELD

## 2014-06-22 ENCOUNTER — Other Ambulatory Visit: Payer: Self-pay | Admitting: Oncology

## 2014-06-23 ENCOUNTER — Encounter: Payer: Self-pay | Admitting: Oncology

## 2014-06-23 ENCOUNTER — Telehealth: Payer: Self-pay | Admitting: Oncology

## 2014-06-23 ENCOUNTER — Ambulatory Visit (HOSPITAL_BASED_OUTPATIENT_CLINIC_OR_DEPARTMENT_OTHER): Payer: BLUE CROSS/BLUE SHIELD | Admitting: Oncology

## 2014-06-23 VITALS — BP 136/59 | HR 91 | Temp 98.5°F | Resp 19 | Ht 72.0 in | Wt 221.9 lb

## 2014-06-23 DIAGNOSIS — D649 Anemia, unspecified: Secondary | ICD-10-CM | POA: Diagnosis not present

## 2014-06-23 DIAGNOSIS — C9000 Multiple myeloma not having achieved remission: Secondary | ICD-10-CM

## 2014-06-23 DIAGNOSIS — R197 Diarrhea, unspecified: Secondary | ICD-10-CM

## 2014-06-23 NOTE — Progress Notes (Signed)
[place] OFFICE PROGRESS NOTE   Diagnosis: Multiple myeloma  INTERVAL HISTORY:   [name] returns for follow-up.  He was asked to come in today to discuss lab values. Most recently he has been on chemotherapy with Carfilzomib/Decadron. Revlimid was discontinued 05/30/2014 secondary to increased diarrhea. He continues to have diarrhea approximate twice a day. He takes Lomotil only about once a day. He continues to work. He reports malaise toward the end of her workday. No fever. The foot pain has improved.  Objective:  Vital signs in last 24 hours:  Blood pressure 136/59, pulse 91, temperature 98.5 F (36.9 C), temperature source Oral, resp. rate 19, height 6' (1.829 m), weight 221 lb 14.4 oz (100.653 kg), SpO2 100 %.    HEENT: No thrush or ulcers Resp: Lungs clear bilaterally Cardio: Regular rate and rhythm, tachycardia GI: No hepatomegaly, nontender Vascular: No leg edema   Portacath/PICC-without erythema  Lab Results:  Lab Results  Component Value Date   WBC 5.6 06/12/2014   HGB 10.6* 06/12/2014   HCT 32.0* 06/12/2014   MCV 89.6 06/12/2014   PLT 208 06/12/2014   NEUTROABS 4.7 06/12/2014    03/031/2016-lambda free light chain 81.20, serum M spike 3.8 Medications: I have reviewed the patient's current medications.  Assessment/Plan: 1.Multiple myeloma, IgG lambda monoclonal protein  -Initial diagnosis 2008, bone marrow with a 40-50% plasma cells  -Lenalidomide 25 mg,day 1-21 and Decadron 40 mg weekly and 3810, complicated by peripheral neuropathy  -Therapy change to bortezomib IV twice weekly plus Decadron  -High-dose chemotherapy with autologous stem cell transplantation 2009 at [facility]  -Bortezomib, cyclophosphamide, and Decadron December 2012 through February 2014 (discontinued secondary to pulmonary aspergillosis)  -January 2014 M spike rise to 1.1 g/dL from 0.1 g/dL in December 2013. Treatment started with Carlfilzomib, lenalidomide, and  Decadron.  -treatment was restarted with Carlfilzomib, lenalidomide, and Decadron 08/16/2012.  -Restaging labs on 10/15/2012 consistent with improvement in the serum M. Protein.  -Continuation of Carfilzomib/Revlimid/dexamethasone.  -Continued improvement in the serum M spike and IgG level on 11/29/2012.  -Improvement in the serum M spike, slight increase in IgG on 01/03/2013.  -Improvement in the serum M spike, stable IgG, stable lambda free light chains 01/31/2013  -The IgG and serum M spike were slightly higher 02/28/2013, stable lambda light chains  -IgG, M spike, and lambda light chains slightly lower on 04/04/2013.  -Continuation of Carfilzomib/Revlimid/dexamethasone.  -IgG, M spike and lambda light chains stable 05/02/2013.  -IgG, serum M spike, and lambda free light chains stable on 07/11/2013; 08/08/2013.  -IgG, serum M spike, and lambda light chains higher 10/17/2013 after being off treatment during July 2015  - Carfilzomib/decadron continued with a cycle starting 10/17/2013  -Increase in serum M spike, light chains and IgG 02/13/2014. - Carfilzomib/ Decadron continued, Revlimid resumed. 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 He has been unable to lower the Duragesic patch dose  4. Irritable bowel syndrome  5. Hypertension  6. "Chest pain " and dyspnea with exertion ,evaluated by cardiology, . Normal stress nuclear study 11/07/2012 and 08/07/2013.  7. history of Diarrhea. Most likely related to Revlimid, improved with Imodium 8. Febrile illness 12/05/2013 with associated diffuse bone pain-no source for infection identified     Disposition:  Patient was seen today with Dr. Benay Spice. His current chemotherapy regimen has been discontinued due to rising M spike and lambda free light chain. The patient was referred back to Dr. Amalia Hailey at [facility] to discuss treatment  options at this point. The  patient was given the telephone [phone] to call and schedule an appointment. Lab values have been faxed to Dr. Amalia Hailey.   Mr. Eisenberger was advised to continue Lomotil as needed for his diarrhea.  We will keep his appointment as scheduled on 07/10/2014. The patient is aware that we can certainly see him sooner if needed.  Mikey Bussing, DNP, AGPCNP-BC, AOCNP  06/23/2014  4:38 PM  This was a shared visit with Mikey Bussing. Mr. Landi has evidence of progressive multiple myeloma with a rising M spike and progressive anemia. We discussed his treatment history and current systemic options. He will be referred to Dr. Amalia Hailey and return to initiate salvage therapy within the next few weeks.  Julieanne Manson, M.D.

## 2014-06-23 NOTE — Telephone Encounter (Signed)
Confirm appointment for April 28. Mailed calendar.

## 2014-06-26 ENCOUNTER — Ambulatory Visit: Payer: BLUE CROSS/BLUE SHIELD

## 2014-06-26 ENCOUNTER — Other Ambulatory Visit: Payer: BLUE CROSS/BLUE SHIELD

## 2014-06-27 ENCOUNTER — Ambulatory Visit: Payer: BLUE CROSS/BLUE SHIELD

## 2014-07-01 ENCOUNTER — Other Ambulatory Visit: Payer: Self-pay | Admitting: *Deleted

## 2014-07-01 ENCOUNTER — Other Ambulatory Visit: Payer: Self-pay | Admitting: Oncology

## 2014-07-01 DIAGNOSIS — C9000 Multiple myeloma not having achieved remission: Secondary | ICD-10-CM

## 2014-07-01 DIAGNOSIS — N08 Glomerular disorders in diseases classified elsewhere: Principal | ICD-10-CM

## 2014-07-01 MED ORDER — FENTANYL 25 MCG/HR TD PT72: 25.0000 ug | MEDICATED_PATCH | TRANSDERMAL | Status: DC

## 2014-07-02 NOTE — Telephone Encounter (Signed)
Pt notified that Fentanyl Rx is ready for pick up. He asked if he should continue weekly Decadron. Last dose 4/14. Reviewed with Dr. Benay Spice: STOP Decadron for now. Left information with Thayer Headings, LPN to inform pt when he picks up prescription.

## 2014-07-10 ENCOUNTER — Other Ambulatory Visit: Payer: BLUE CROSS/BLUE SHIELD

## 2014-07-10 ENCOUNTER — Ambulatory Visit: Payer: BLUE CROSS/BLUE SHIELD | Admitting: Oncology

## 2014-07-10 ENCOUNTER — Ambulatory Visit: Payer: BLUE CROSS/BLUE SHIELD

## 2014-07-11 ENCOUNTER — Ambulatory Visit: Payer: BLUE CROSS/BLUE SHIELD

## 2014-07-14 ENCOUNTER — Telehealth: Payer: Self-pay | Admitting: *Deleted

## 2014-07-14 NOTE — Telephone Encounter (Signed)
Called to ask patient if he was going to be started on a clinical trial.  Per Dr. Benay Spice.  Patient stated he did not know anything about a Clinical trial. That Dr. Amalia Hailey was waiting on lab work to discuss more.

## 2014-07-16 ENCOUNTER — Other Ambulatory Visit: Payer: Self-pay | Admitting: Oncology

## 2014-07-17 ENCOUNTER — Ambulatory Visit: Payer: BLUE CROSS/BLUE SHIELD

## 2014-07-17 ENCOUNTER — Other Ambulatory Visit: Payer: BLUE CROSS/BLUE SHIELD

## 2014-07-18 ENCOUNTER — Ambulatory Visit: Payer: BLUE CROSS/BLUE SHIELD

## 2014-07-18 ENCOUNTER — Other Ambulatory Visit: Payer: Self-pay | Admitting: Oncology

## 2014-07-24 ENCOUNTER — Other Ambulatory Visit: Payer: BLUE CROSS/BLUE SHIELD

## 2014-07-24 ENCOUNTER — Ambulatory Visit: Payer: BLUE CROSS/BLUE SHIELD

## 2014-07-25 ENCOUNTER — Ambulatory Visit: Payer: BLUE CROSS/BLUE SHIELD

## 2014-07-30 DIAGNOSIS — K529 Noninfective gastroenteritis and colitis, unspecified: Secondary | ICD-10-CM

## 2014-07-30 DIAGNOSIS — Z006 Encounter for examination for normal comparison and control in clinical research program: Secondary | ICD-10-CM | POA: Insufficient documentation

## 2014-07-30 HISTORY — DX: Noninfective gastroenteritis and colitis, unspecified: K52.9

## 2014-08-05 ENCOUNTER — Emergency Department (HOSPITAL_COMMUNITY)
Admission: EM | Admit: 2014-08-05 | Discharge: 2014-08-06 | Disposition: A | Discharge status: Home / Self Care | Payer: BLUE CROSS/BLUE SHIELD | Attending: Emergency Medicine | Admitting: Emergency Medicine

## 2014-08-05 ENCOUNTER — Emergency Department (HOSPITAL_COMMUNITY): Payer: BLUE CROSS/BLUE SHIELD

## 2014-08-05 ENCOUNTER — Encounter (HOSPITAL_COMMUNITY): Payer: Self-pay | Admitting: Emergency Medicine

## 2014-08-05 DIAGNOSIS — Z7982 Long term (current) use of aspirin: Secondary | ICD-10-CM | POA: Diagnosis not present

## 2014-08-05 DIAGNOSIS — I10 Essential (primary) hypertension: Secondary | ICD-10-CM | POA: Diagnosis not present

## 2014-08-05 DIAGNOSIS — Z792 Long term (current) use of antibiotics: Secondary | ICD-10-CM | POA: Diagnosis not present

## 2014-08-05 DIAGNOSIS — Z8619 Personal history of other infectious and parasitic diseases: Secondary | ICD-10-CM | POA: Diagnosis not present

## 2014-08-05 DIAGNOSIS — Z87891 Personal history of nicotine dependence: Secondary | ICD-10-CM | POA: Diagnosis not present

## 2014-08-05 DIAGNOSIS — R0602 Shortness of breath: Secondary | ICD-10-CM

## 2014-08-05 DIAGNOSIS — J189 Pneumonia, unspecified organism: Secondary | ICD-10-CM

## 2014-08-05 DIAGNOSIS — J159 Unspecified bacterial pneumonia: Secondary | ICD-10-CM | POA: Insufficient documentation

## 2014-08-05 DIAGNOSIS — C9000 Multiple myeloma not having achieved remission: Secondary | ICD-10-CM | POA: Diagnosis not present

## 2014-08-05 DIAGNOSIS — Z79899 Other long term (current) drug therapy: Secondary | ICD-10-CM | POA: Diagnosis not present

## 2014-08-05 DIAGNOSIS — R7989 Other specified abnormal findings of blood chemistry: Secondary | ICD-10-CM | POA: Diagnosis present

## 2014-08-05 LAB — CBC WITH DIFFERENTIAL/PLATELET
BASOS PCT: 1 % (ref 0–1)
Basophils Absolute: 0 10*3/uL (ref 0.0–0.1)
Eosinophils Absolute: 0.1 10*3/uL (ref 0.0–0.7)
Eosinophils Relative: 3 % (ref 0–5)
HCT: 25.8 % — ABNORMAL LOW (ref 39.0–52.0)
Hemoglobin: 8.2 g/dL — ABNORMAL LOW (ref 13.0–17.0)
Lymphocytes Relative: 28 % (ref 12–46)
Lymphs Abs: 0.9 10*3/uL (ref 0.7–4.0)
MCH: 28.6 pg (ref 26.0–34.0)
MCHC: 31.8 g/dL (ref 30.0–36.0)
MCV: 89.9 fL (ref 78.0–100.0)
MONOS PCT: 15 % — AB (ref 3–12)
Monocytes Absolute: 0.5 10*3/uL (ref 0.1–1.0)
NEUTROS ABS: 1.7 10*3/uL (ref 1.7–7.7)
Neutrophils Relative %: 53 % (ref 43–77)
PLATELETS: 167 10*3/uL (ref 150–400)
RBC: 2.87 MIL/uL — AB (ref 4.22–5.81)
RDW: 17.7 % — ABNORMAL HIGH (ref 11.5–15.5)
WBC: 3.1 10*3/uL — ABNORMAL LOW (ref 4.0–10.5)

## 2014-08-05 LAB — PROTIME-INR
INR: 1.09 (ref 0.00–1.49)
Prothrombin Time: 14.3 seconds (ref 11.6–15.2)

## 2014-08-05 LAB — D-DIMER, QUANTITATIVE: D-Dimer, Quant: 2.17 ug/mL-FEU — ABNORMAL HIGH (ref 0.00–0.48)

## 2014-08-05 LAB — BASIC METABOLIC PANEL
Anion gap: 11 (ref 5–15)
BUN: 14 mg/dL (ref 6–20)
CALCIUM: 9.5 mg/dL (ref 8.9–10.3)
CHLORIDE: 100 mmol/L — AB (ref 101–111)
CO2: 24 mmol/L (ref 22–32)
Creatinine, Ser: 1.23 mg/dL (ref 0.61–1.24)
GFR calc non Af Amer: >60 mL/min (ref >60)
Glucose, Bld: 146 mg/dL — ABNORMAL HIGH (ref 65–99)
POTASSIUM: 3.9 mmol/L (ref 3.5–5.1)
Sodium: 135 mmol/L (ref 135–145)

## 2014-08-05 LAB — I-STAT TROPONIN, ED: Troponin i, poc: 0.01 ng/mL (ref 0.00–0.08)

## 2014-08-05 MED ORDER — SODIUM CHLORIDE 0.9 % IV BOLUS (SEPSIS)
1000.0000 mL | Freq: Once | INTRAVENOUS | Status: AC
  Administered 2014-08-05: 1000 mL via INTRAVENOUS

## 2014-08-05 MED ORDER — IOHEXOL 350 MG/ML SOLN
100.0000 mL | Freq: Once | INTRAVENOUS | Status: AC | PRN
Start: 2014-08-05 — End: 2014-08-05
  Administered 2014-08-05: 100 mL via INTRAVENOUS

## 2014-08-05 NOTE — ED Notes (Addendum)
Pt states he was sent in after having a positive D-dimer of greater than 3. Pt has a hx of multiple myeloma and is about to start taking a clinical trial at [facility] thus, he has stopped taking his dexamethasone after taking them for 7-8 years. Alert and oriented. Feels weak and dizzy. Recently dx with bronchitis.

## 2014-08-05 NOTE — ED Notes (Signed)
Bed: BC48 Expected date:  Expected time:  Means of arrival:  Comments: [facility] Walter Dennis/0125/1952/multiple myeloma with d-dimer 3.28-need to R/O PE

## 2014-08-05 NOTE — ED Provider Notes (Signed)
CSN: [id number]     Arrival date & time 08/05/14  2032 History   First MD Initiated Contact with Patient 08/05/14 2258     Chief Complaint  Patient presents with  . Abnormal Lab     (Consider location/radiation/quality/duration/timing/severity/associated sxs/prior Treatment) HPI  Walter Dennis is a 64 y.o. male with past medical history of hypertension, multiple myeloma and aspergillosis presenting today with his chest pain and shortness of breath. Patient says this been going on for the past week. His chest pain is left-sided and worse with exertion. He become short of breath and diaphoretic as well. He admits to dizziness. Patient has had productive cough and earache during the interval. His last chemotherapy session was 3 weeks ago. Patient had a positive D-dimer test at his primary care physician's office. He also states he has had LLE swelling and pain during the interval.  He is presenting today for CT angiogram for evaluation for PE. Patient otherwise has no further complaints.  10 Systems reviewed and are negative for acute change except as noted in the HPI.    Past Medical History  Diagnosis Date  . Hypertension   . Cancer    Past Surgical History  Procedure Laterality Date  . Portacath placement      right chest   History reviewed. No pertinent family history. History  Substance Use Topics  . Smoking status: Former Smoker    Quit date: 09/29/2002  . Smokeless tobacco: Never Used  . Alcohol Use: No    Review of Systems    Allergies  Review of patient's allergies indicates no known allergies.  Home Medications   Prior to Admission medications   Medication Sig Start Date End Date Taking? Authorizing Provider  acetaminophen (TYLENOL) 325 MG tablet Take 2 tablets by mouth every 4 (four) hours as needed. Pain/fever   Yes Historical Provider, MD  allopurinol (ZYLOPRIM) 300 MG tablet Take 1 tablet by mouth daily. 07/30/14 07/30/15 Yes Historical Provider, MD  aspirin 81  MG chewable tablet Chew 81 mg by mouth daily.   Yes Historical Provider, MD  carvedilol (COREG) 6.25 MG tablet Take 6.25 mg by mouth 2 (two) times daily with a meal.   Yes Historical Provider, MD  diphenoxylate-atropine (LOMOTIL) 2.5-0.025 MG per tablet Take 1 tablet by mouth 4 (four) times daily as needed for diarrhea or loose stools.   Yes Historical Provider, MD  doxycycline (VIBRA-TABS) 100 MG tablet Take 100 mg by mouth 2 (two) times daily. For 10 days   Yes Historical Provider, MD  esomeprazole (NEXIUM) 40 MG capsule Take 40 mg by mouth daily at 12 noon.   Yes Historical Provider, MD  fentaNYL (DURAGESIC - DOSED MCG/HR) 25 MCG/HR patch Place 1 patch (25 mcg total) onto the skin every 3 (three) days. 07/01/14  Yes Ladell Pier, MD  furosemide (LASIX) 40 MG tablet Take 40 mg by mouth daily.   Yes Historical Provider, MD  ondansetron (ZOFRAN) 8 MG tablet Take 1 tablet (8 mg total) by mouth every 12 (twelve) hours as needed for nausea. 11/29/13  Yes Ladell Pier, MD  oxyCODONE (OXY IR/ROXICODONE) 5 MG immediate release tablet Take 1 tablet by mouth every 4 (four) hours as needed. pain 07/30/14  Yes Historical Provider, MD  potassium chloride (K-DUR) 10 MEQ tablet Take 1 tablet (10 mEq total) by mouth daily. 01/02/14  Yes Thayer Headings, MD  pregabalin (LYRICA) 75 MG capsule Take 1 capsule (75 mg total) by mouth 2 (two) times daily. 05/15/14  Yes Ladell Pier, MD  valsartan (DIOVAN) 160 MG tablet Take 1 tablet (160 mg total) by mouth daily. 01/02/14  Yes Thayer Headings, MD  acyclovir (ZOVIRAX) 400 MG tablet Take 400 mg by mouth every 12 (twelve) hours. Take 1 tablet by mouth every 12 hours to prevent shingles while on chemo    Historical Provider, MD  carvedilol (COREG) 6.25 MG tablet TAKE 1 TABLET BY MOUTH TWICE A DAY Patient not taking: Reported on 08/05/2014 07/03/14   Ladell Pier, MD  dexamethasone (DECADRON) 4 MG tablet TAKE 5 TABLETS (20 MG TOTAL) BY MOUTH ONCE A WEEK. ONCE WEEKLY ON  THURSDAY. Patient not taking: Reported on 08/05/2014 04/16/14   Ladell Pier, MD  diphenoxylate-atropine (LOMOTIL) 2.5-0.025 MG per tablet TAKE 1 TABLET 4 TIMES A DAY AS NEEDED FOR DIARRHEA Patient not taking: Reported on 08/05/2014 07/16/14   Ladell Pier, MD  esomeprazole ([place]) 40 MG capsule TAKE ONE CAPSULE EVERY DAY Patient not taking: Reported on 08/05/2014 09/24/13   Ladell Pier, MD  furosemide (LASIX) 40 MG tablet TAKE 1 TABLET BY MOUTH EVERY DAY Patient not taking: Reported on 08/05/2014 04/02/14   Ladell Pier, MD  HYDROcodone-acetaminophen (NORCO) 10-325 MG per tablet Take 1 tablet by mouth every 6 (six) hours as needed. Patient not taking: Reported on 08/05/2014 12/27/13   Owens Shark, NP  nitroGLYCERIN (NITROSTAT) 0.3 MG SL tablet Place 1 tablet (0.3 mg total) under the tongue every 5 (five) minutes as needed for chest pain. No more than 3 doses in period of 15 minutes per episode. 09/28/12   Antonietta Breach, PA-C  PRESCRIPTION MEDICATION Inject 60 mg into the vein once. carfilzomib (KYPROLIS) 60 mg in dextrose 5 % 50 mL chemo infusion 27 mg/m2  2.2 m2 (Treatment Plan Actual) Once 09/27/2012    Historical Provider, MD   BP 115/86 mmHg  Pulse 74  Resp 15  SpO2 100% Physical Exam  Constitutional: He is oriented to person, place, and time. Vital signs are normal. He appears well-developed and well-nourished.  Non-toxic appearance. He does not appear ill. No distress.  HENT:  Head: Normocephalic and atraumatic.  Nose: Nose normal.  Mouth/Throat: Oropharynx is clear and moist. No oropharyngeal exudate.  Eyes: Conjunctivae and EOM are normal. Pupils are equal, round, and reactive to light. No scleral icterus.  Neck: Normal range of motion. Neck supple. No tracheal deviation, no edema, no erythema and normal range of motion present. No thyroid mass and no thyromegaly present.  Cardiovascular: Normal rate, regular rhythm, S1 normal, S2 normal, normal heart sounds, intact distal pulses  and normal pulses.  Exam reveals no gallop and no friction rub.   No murmur heard. Pulses:      Radial pulses are 2+ on the right side, and 2+ on the left side.       Dorsalis pedis pulses are 2+ on the right side, and 2+ on the left side.  Pulmonary/Chest: Effort normal and breath sounds normal. No respiratory distress. He has no wheezes. He has no rhonchi. He has no rales.  Abdominal: Soft. Normal appearance and bowel sounds are normal. He exhibits no distension, no ascites and no mass. There is no hepatosplenomegaly. There is no tenderness. There is no rebound, no guarding and no CVA tenderness.  Musculoskeletal: Normal range of motion. He exhibits no edema or tenderness.  Lymphadenopathy:    He has no cervical adenopathy.  Neurological: He is alert and oriented to person, place, and time. He has  normal strength. No cranial nerve deficit or sensory deficit. He exhibits normal muscle tone.  Skin: Skin is warm, dry and intact. No petechiae and no rash noted. He is not diaphoretic. No erythema. No pallor.  Psychiatric: He has a normal mood and affect. His behavior is normal. Judgment normal.  Nursing note and vitals reviewed.   ED Course  Procedures (including critical care time) Labs Review Labs Reviewed  CBC WITH DIFFERENTIAL/PLATELET - Abnormal; Notable for the following:    WBC 3.1 (*)    RBC 2.87 (*)    Hemoglobin 8.2 (*)    HCT 25.8 (*)    RDW 17.7 (*)    Monocytes Relative 15 (*)    All other components within normal limits  BASIC METABOLIC PANEL - Abnormal; Notable for the following:    Chloride 100 (*)    Glucose, Bld 146 (*)    All other components within normal limits  D-DIMER, QUANTITATIVE - Abnormal; Notable for the following:    D-Dimer, Quant 2.17 (*)    All other components within normal limits  CULTURE, BLOOD (ROUTINE X 2)  CULTURE, BLOOD (ROUTINE X 2)  PROTIME-INR  I-STAT TROPOININ, ED    Imaging Review Ct Angio Chest Pe W/cm &/or Wo Cm  08/06/2014    CLINICAL DATA:  Shortness of breath and elevated D-dimer.  EXAM: CT ANGIOGRAPHY CHEST WITH CONTRAST  TECHNIQUE: Multidetector CT imaging of the chest was performed using the standard protocol during bolus administration of intravenous contrast. Multiplanar CT image reconstructions and MIPs were obtained to evaluate the vascular anatomy.  CONTRAST:  122m OMNIPAQUE IOHEXOL 350 MG/ML SOLN  COMPARISON:  None.  FINDINGS: THORACIC INLET/BODY WALL:  Mild symmetric gynecomastia.  Right IJ porta catheter, tip at the SVC level.  No adenopathy.  MEDIASTINUM:  Normal heart size. No pericardial effusion. Atherosclerosis, including the coronary arteries. No aortic aneurysm or dissection. No evidence of pulmonary embolism. Prominent mediastinal and hilar lymph nodes, likely reactive.  LUNG WINDOWS:  There is bronchial wall thickening, especially the lower lobes with patchy clusters of ill-defined centrilobular nodules in the posterior upper lobes, dependent right lower lobe, and right middle lobe. Appearance is most consistent with an atypical infection. No interlobular septal thickening, effusion, or pneumothorax. Few emphysematous spaces.  UPPER ABDOMEN:  Hepatic steatosis.  OSSEOUS:  Osteopenia without fracture.  No discrete myelomatous lesion.  Review of the MIP images confirms the above findings.  IMPRESSION: 1. Multi focal airspace disease consistent with pneumonia. 2. No evidence of pulmonary embolism. 3. Mild emphysema. 4. Hepatic steatosis.   Electronically Signed   By: JMonte FantasiaM.D.   On: 08/06/2014 00:12     EKG Interpretation   Date/Time:  Wednesday Aug 06 2014 00:07:52 EDT Ventricular Rate:  75 PR Interval:  188 QRS Duration: 141 QT Interval:  433 QTC Calculation: 484 R Axis:   12 Text Interpretation:  Sinus rhythm Left bundle branch block Confirmed by  OGlynn Octave([phone] on 08/06/2014 12:21:42 AM      MDM   Final diagnoses:  SOB (shortness of breath)    Patient presents  emergency department for chest pain, shortness of breath, left foot swelling and a positive d-dimer. He has a history of cancer. He is also at high risk for pulmonary embolism, history also suggests possible pneumonia. He's had cough and fever. Will obtain CT agent for evaluation.  CT scan reveals multifocal pneumonia. Patient does not meet criteria for healthcare associated pneumonia. I confirmed this with [facility] hospitalist on-call.  Blood cultures were drawn, patient was given Levaquin in the emergency department. We'll discharge home with five-day prescription. He is advised to see his primary care physician in the next 2 days for close follow-up. Strict return precautions given. Patient continues to be greater than 98% on room air. He is well-appearing and in no acute distress. His vital signs remain within his normal limits and he is safe for discharge.  Everlene Balls, MD 08/06/14 [phone]

## 2014-08-06 MED ORDER — LEVOFLOXACIN 750 MG PO TABS: 750.0000 mg | ORAL_TABLET | Freq: Every day | ORAL | Status: DC

## 2014-08-06 MED ORDER — LEVOFLOXACIN 750 MG PO TABS
750.0000 mg | ORAL_TABLET | Freq: Once | ORAL | Status: AC
  Administered 2014-08-06: 750 mg via ORAL
  Filled 2014-08-06: qty 1

## 2014-08-06 NOTE — ED Notes (Signed)
Patient transported to CT 

## 2014-08-06 NOTE — Discharge Instructions (Signed)
Pneumonia, Adult [name], see your primary care physician within 2 days for close follow-up. If you have any worsening symptoms, chest pain, shortness of breath or concerns come back to emergency department immediately. Take antibiotics as prescribed. Thank you. Pneumonia is an infection of the lungs. It may be caused by a germ (virus or bacteria). Some types of pneumonia can spread easily from person to person. This can happen when you cough or sneeze. HOME CARE  Only take medicine as told by your doctor.  Take your medicine (antibiotics) as told. Finish it even if you start to feel better.  Do not smoke.  You may use a vaporizer or humidifier in your room. This can help loosen thick spit (mucus).  Sleep so you are almost sitting up (semi-upright). This helps reduce coughing.  Rest. A shot (vaccine) can help prevent pneumonia. Shots are often advised for:  People over 2 years old.  Patients on chemotherapy.  People with long-term (chronic) lung problems.  People with immune system problems. GET HELP RIGHT AWAY IF:   You are getting worse.  You cannot control your cough, and you are losing sleep.  You cough up blood.  Your pain gets worse, even with medicine.  You have a fever.  Any of your problems are getting worse, not better.  You have shortness of breath or chest pain. MAKE SURE YOU:   Understand these instructions.  Will watch your condition.  Will get help right away if you are not doing well or get worse. Document Released: 08/17/2007 Document Revised: 05/23/2011 Document Reviewed: 05/21/2010 [facility] Patient Information 2015 [place], [place]. This information is not intended to replace advice given to you by your health care provider. Make sure you discuss any questions you have with your health care provider.

## 2014-08-08 ENCOUNTER — Telehealth: Payer: Self-pay | Admitting: *Deleted

## 2014-08-08 ENCOUNTER — Telehealth: Payer: Self-pay | Admitting: Oncology

## 2014-08-08 DIAGNOSIS — C9000 Multiple myeloma not having achieved remission: Secondary | ICD-10-CM

## 2014-08-08 NOTE — Telephone Encounter (Signed)
Called pt with instructions to report to ED over the weekend if symptoms do not improve. Will need to rule out fungal infection. Pt voiced understanding, stated "that's what they told me." Confirmed appointment for 08/13/14.

## 2014-08-08 NOTE — Telephone Encounter (Signed)
s.w. pt and advised on June appt....pt ok and aware °

## 2014-08-08 NOTE — Telephone Encounter (Addendum)
Message from [facility] with Dr. Amalia Hailey at [facility]. Pt was seen in ED for pneumonia. They are requesting pt to be seen in the office early next week. Pt's treatment has been rescheduled to 6/9 due to pneumonia. Reviewed with Dr. Benay Spice: Orders entered for appointment 08/13/14.

## 2014-08-12 ENCOUNTER — Ambulatory Visit (HOSPITAL_BASED_OUTPATIENT_CLINIC_OR_DEPARTMENT_OTHER): Payer: BLUE CROSS/BLUE SHIELD | Admitting: Oncology

## 2014-08-12 ENCOUNTER — Other Ambulatory Visit (HOSPITAL_BASED_OUTPATIENT_CLINIC_OR_DEPARTMENT_OTHER): Payer: BLUE CROSS/BLUE SHIELD

## 2014-08-12 ENCOUNTER — Telehealth: Payer: Self-pay | Admitting: *Deleted

## 2014-08-12 ENCOUNTER — Telehealth: Payer: Self-pay | Admitting: Oncology

## 2014-08-12 ENCOUNTER — Other Ambulatory Visit: Payer: Self-pay | Admitting: *Deleted

## 2014-08-12 VITALS — BP 125/60 | HR 94 | Temp 98.2°F | Resp 18 | Ht 72.0 in | Wt 220.1 lb

## 2014-08-12 DIAGNOSIS — C9000 Multiple myeloma not having achieved remission: Secondary | ICD-10-CM | POA: Diagnosis not present

## 2014-08-12 LAB — CBC WITH DIFFERENTIAL/PLATELET
BASO%: 0.7 % (ref 0.0–2.0)
Basophils Absolute: 0 10*3/uL (ref 0.0–0.1)
EOS%: 3.6 % (ref 0.0–7.0)
Eosinophils Absolute: 0.2 10*3/uL (ref 0.0–0.5)
HCT: 26.2 % — ABNORMAL LOW (ref 38.4–49.9)
HGB: 8.6 g/dL — ABNORMAL LOW (ref 13.0–17.1)
LYMPH%: 32.1 % (ref 14.0–49.0)
MCH: 28.9 pg (ref 27.2–33.4)
MCHC: 32.8 g/dL (ref 32.0–36.0)
MCV: 87.9 fL (ref 79.3–98.0)
MONO#: 0.4 10*3/uL (ref 0.1–0.9)
MONO%: 8.9 % (ref 0.0–14.0)
NEUT#: 2.3 10*3/uL (ref 1.5–6.5)
NEUT%: 54.7 % (ref 39.0–75.0)
PLATELETS: 197 10*3/uL (ref 140–400)
RBC: 2.98 10*6/uL — AB (ref 4.20–5.82)
RDW: 17.7 % — AB (ref 11.0–14.6)
WBC: 4.2 10*3/uL (ref 4.0–10.3)
lymph#: 1.3 10*3/uL (ref 0.9–3.3)
nRBC: 1 % — ABNORMAL HIGH (ref 0–0)

## 2014-08-12 LAB — COMPREHENSIVE METABOLIC PANEL (CC13)
ALK PHOS: 66 U/L (ref 40–150)
ALT: 16 U/L (ref 0–55)
ANION GAP: 11 meq/L (ref 3–11)
AST: 17 U/L (ref 5–34)
Albumin: 3.1 g/dL — ABNORMAL LOW (ref 3.5–5.0)
BILIRUBIN TOTAL: 0.68 mg/dL (ref 0.20–1.20)
BUN: 13.5 mg/dL (ref 7.0–26.0)
CHLORIDE: 102 meq/L (ref 98–109)
CO2: 26 meq/L (ref 22–29)
CREATININE: 1.5 mg/dL — AB (ref 0.7–1.3)
Calcium: 9.7 mg/dL (ref 8.4–10.4)
EGFR: 51 mL/min/{1.73_m2} — AB (ref >90)
Glucose: 87 mg/dl (ref 70–140)
Potassium: 3.7 mEq/L (ref 3.5–5.1)
SODIUM: 138 meq/L (ref 136–145)
Total Protein: 9.6 g/dL — ABNORMAL HIGH (ref 6.4–8.3)

## 2014-08-12 LAB — CULTURE, BLOOD (ROUTINE X 2)
CULTURE: NO GROWTH
Culture: NO GROWTH

## 2014-08-12 LAB — URIC ACID (CC13): URIC ACID, SERUM: 12.5 mg/dL — AB (ref 2.6–7.4)

## 2014-08-12 NOTE — Telephone Encounter (Signed)
Called pt with appointment to come in today, he can be here at 2:00.

## 2014-08-12 NOTE — Telephone Encounter (Signed)
Per 05/31 POF pt moved labs/ov from 06/01 to 05/31 pt is aware... KJ

## 2014-08-12 NOTE — Telephone Encounter (Signed)
Faxed CBC,Cmet and Uric acid results to Terri Skains with Dr. Amalia Hailey at [facility].

## 2014-08-12 NOTE — Progress Notes (Signed)
[facility] OFFICE PROGRESS NOTE   Diagnosis: Multiple myeloma  INTERVAL HISTORY:   Walter Dennis returns for an unscheduled visit. His myeloma had progressed at the last office visit here and he was referred to Dr. Amalia Hailey. He has enrolled on a clinical trial at [facility] and was scheduled to begin therapy this week. Approximately one week ago he noted the onset of dyspnea, cough, and fever. He was evaluated at [facility] and sent to the emergency room when the d-dimer returned elevated. A CT of the chest 08/05/2014 revealed bronchial wall thickening with ill-defined centrilobular nodules in the posterior upper lobes, dependent right lower lobe, and right middle lobe. The changes were consistent with an atypical infection. No discrete myelomatous lesion. No evidence of pulmonary embolism.  He was placed on Levaquin. He reports the symptoms are much improved over the past few days. No leg swelling. Chronic neuropathy pain in the lower legs.  Objective:  Vital signs in last 24 hours:  Blood pressure 125/60, pulse 94, temperature 98.2 F (36.8 C), temperature source Oral, resp. rate 18, height 6' (1.829 m), weight 220 lb 1.6 oz (99.837 kg), SpO2 98 %.    HEENT: No thrush or ulcers Resp: A few coarse rhonchi at the left posterior base, no respiratory distress Cardio: Regular rate and rhythm GI: No hepatosplenomegaly Vascular: No leg edema or erythema   Portacath/PICC-without erythema  Lab Results:  Lab Results  Component Value Date   WBC 4.2 08/12/2014   HGB 8.6* 08/12/2014   HCT 26.2* 08/12/2014   MCV 87.9 08/12/2014   PLT 197 08/12/2014   NEUTROABS 2.3 08/12/2014   potassium 3.7, BUN 13.5, creatinine 1.5, calcium 9.7, uric acid 12.5, total protein 9.6, albumen 3.1   Medications: I have reviewed the patient's current medications.  Assessment/Plan: 1.Multiple myeloma, IgG lambda monoclonal protein  -Initial diagnosis 2008, bone marrow with a 40-50% plasma cells   -Lenalidomide 25 mg,day 1-21 and Decadron 40 mg weekly and 4287, complicated by peripheral neuropathy  -Therapy change to bortezomib IV twice weekly plus Decadron  -High-dose chemotherapy with autologous stem cell transplantation 2009 at [facility]  -Bortezomib, cyclophosphamide, and Decadron December 2012 through February 2014 (discontinued secondary to pulmonary aspergillosis)  -January 2014 M spike rise to 1.1 g/dL from 0.1 g/dL in December 2013. Treatment started with Carlfilzomib, lenalidomide, and Decadron.  -treatment was restarted with Carlfilzomib, lenalidomide, and Decadron 08/16/2012.  -Restaging labs on 10/15/2012 consistent with improvement in the serum M. Protein.  -Continuation of Carfilzomib/Revlimid/dexamethasone.  -Continued improvement in the serum M spike and IgG level on 11/29/2012.  -Improvement in the serum M spike, slight increase in IgG on 01/03/2013.  -Improvement in the serum M spike, stable IgG, stable lambda free light chains 01/31/2013  -The IgG and serum M spike were slightly higher 02/28/2013, stable lambda light chains  -IgG, M spike, and lambda light chains slightly lower on 04/04/2013.  -Continuation of Carfilzomib/Revlimid/dexamethasone.  -IgG, M spike and lambda light chains stable 05/02/2013.  -IgG, serum M spike, and lambda free light chains stable on 07/11/2013; 08/08/2013.  -IgG, serum M spike, and lambda light chains higher 10/17/2013 after being off treatment during July 2015  - Carfilzomib/decadron continued with a cycle starting 10/17/2013  -Increase in serum M spike, light chains and IgG 02/13/2014. - Carfilzomib/ Decadron continued, Revlimid resumed. -Increased serum M spike, light chains, and IgG 06/12/2014: Carrfilzomib/Decadron/Revlimid discontinued -Bone marrow biopsy at [facility] 07/30/2014 consistent with plasma cell myeloma, 60% plasma cells  -Enrollment on a clinical trial at  [facility] with bendamustine, pomalidomide, and  Decadron 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 He has been unable to lower the Duragesic patch dose  4. Irritable bowel syndrome  5. Hypertension  6. "Chest pain " and dyspnea with exertion ,evaluated by cardiology, . Normal stress nuclear study 11/07/2012 and 08/07/2013.  7. history of Diarrhea. Most likely related to Revlimid, improved with Imodium 8. Febrile illness 12/05/2013 with associated diffuse bone pain-no source for infection identified  9. Pneumonia 08/05/2014, CT with changes consistent with an atypical infection, clinical improvement with Levaquin 10. Progressive anemia secondary to multiple myeloma    Disposition: Walter Dennis has progressive multiple myeloma as evidenced by the rising serum M spike, progressive anemia, and recent bone marrow findings. He is scheduled to begin treatment on a clinical trial at [facility]. He was diagnosed with "pneumonia "last week with changes concerning for an atypical pneumonia on a chest CT 08/05/2014. His clinical status is improved with Levaquin. He will complete the prescribed course of Levaquin and follow-up at [facility] next week.  He is scheduled to return for an appointment here in approximately 2-1/2 months. We are available to see him sooner as needed.   Betsy Coder, MD  08/12/2014  3:46 PM

## 2014-08-13 ENCOUNTER — Other Ambulatory Visit: Payer: Self-pay | Admitting: *Deleted

## 2014-08-13 ENCOUNTER — Ambulatory Visit: Payer: BLUE CROSS/BLUE SHIELD | Admitting: Oncology

## 2014-08-13 ENCOUNTER — Telehealth: Payer: Self-pay | Admitting: *Deleted

## 2014-08-13 ENCOUNTER — Other Ambulatory Visit: Payer: BLUE CROSS/BLUE SHIELD

## 2014-08-13 DIAGNOSIS — C9000 Multiple myeloma not having achieved remission: Secondary | ICD-10-CM

## 2014-08-13 NOTE — Telephone Encounter (Signed)
Walter Dennis from [facility] is calling and patient will need additional labs this Friday 08/22/14.  She will fax orders.  Please call her with any questions. [phone].

## 2014-08-14 ENCOUNTER — Telehealth: Payer: Self-pay | Admitting: Oncology

## 2014-08-14 NOTE — Telephone Encounter (Signed)
Pt confirmed labs per 06/02 POF, also advised pt schedule went out in the mail for updated schedule per 06/01 POF.... Cherylann Banas

## 2014-08-15 ENCOUNTER — Other Ambulatory Visit (HOSPITAL_BASED_OUTPATIENT_CLINIC_OR_DEPARTMENT_OTHER): Payer: BLUE CROSS/BLUE SHIELD

## 2014-08-15 DIAGNOSIS — C9000 Multiple myeloma not having achieved remission: Secondary | ICD-10-CM

## 2014-08-15 LAB — CBC WITH DIFFERENTIAL/PLATELET
BASO%: 0.5 % (ref 0.0–2.0)
Basophils Absolute: 0 10*3/uL (ref 0.0–0.1)
EOS%: 3.1 % (ref 0.0–7.0)
Eosinophils Absolute: 0.1 10*3/uL (ref 0.0–0.5)
HEMATOCRIT: 25 % — AB (ref 38.4–49.9)
HGB: 8.1 g/dL — ABNORMAL LOW (ref 13.0–17.1)
LYMPH#: 1.1 10*3/uL (ref 0.9–3.3)
LYMPH%: 27.9 % (ref 14.0–49.0)
MCH: 28.8 pg (ref 27.2–33.4)
MCHC: 32.4 g/dL (ref 32.0–36.0)
MCV: 89 fL (ref 79.3–98.0)
MONO#: 0.3 10*3/uL (ref 0.1–0.9)
MONO%: 8.9 % (ref 0.0–14.0)
NEUT#: 2.3 10*3/uL (ref 1.5–6.5)
NEUT%: 59.6 % (ref 39.0–75.0)
PLATELETS: 162 10*3/uL (ref 140–400)
RBC: 2.81 10*6/uL — AB (ref 4.20–5.82)
RDW: 18.2 % — ABNORMAL HIGH (ref 11.0–14.6)
WBC: 3.8 10*3/uL — AB (ref 4.0–10.3)

## 2014-08-15 LAB — BASIC METABOLIC PANEL (CC13)
Anion Gap: 10 mEq/L (ref 3–11)
BUN: 15.5 mg/dL (ref 7.0–26.0)
CO2: 23 meq/L (ref 22–29)
CREATININE: 1.3 mg/dL (ref 0.7–1.3)
Calcium: 10.1 mg/dL (ref 8.4–10.4)
Chloride: 106 mEq/L (ref 98–109)
EGFR: 57 mL/min/{1.73_m2} — ABNORMAL LOW (ref >90)
Glucose: 105 mg/dl (ref 70–140)
Potassium: 3.9 mEq/L (ref 3.5–5.1)
Sodium: 140 mEq/L (ref 136–145)

## 2014-08-15 LAB — URIC ACID (CC13): Uric Acid, Serum: 11 mg/dl — ABNORMAL HIGH (ref 2.6–7.4)

## 2014-08-15 LAB — LACTATE DEHYDROGENASE (CC13): LDH: 172 U/L (ref 125–245)

## 2014-08-18 ENCOUNTER — Telehealth: Payer: Self-pay | Admitting: *Deleted

## 2014-08-18 NOTE — Telephone Encounter (Signed)
Faxed Pt's  08/15/14 lab results to Gillermina Hu, RN (fax # [phone]); [place] per office request.

## 2014-09-01 ENCOUNTER — Emergency Department (HOSPITAL_COMMUNITY): Payer: BLUE CROSS/BLUE SHIELD

## 2014-09-01 ENCOUNTER — Encounter (HOSPITAL_COMMUNITY): Payer: Self-pay

## 2014-09-01 ENCOUNTER — Inpatient Hospital Stay (HOSPITAL_COMMUNITY)
Admission: EM | Admit: 2014-09-01 | Discharge: 2014-09-13 | DRG: 871 | Disposition: A | Discharge status: Home Health | Payer: BLUE CROSS/BLUE SHIELD | Attending: Internal Medicine | Admitting: Internal Medicine

## 2014-09-01 DIAGNOSIS — J9601 Acute respiratory failure with hypoxia: Secondary | ICD-10-CM | POA: Diagnosis present

## 2014-09-01 DIAGNOSIS — I25110 Atherosclerotic heart disease of native coronary artery with unstable angina pectoris: Secondary | ICD-10-CM | POA: Diagnosis present

## 2014-09-01 DIAGNOSIS — N183 Chronic kidney disease, stage 3 unspecified: Secondary | ICD-10-CM

## 2014-09-01 DIAGNOSIS — Z9484 Stem cells transplant status: Secondary | ICD-10-CM | POA: Diagnosis not present

## 2014-09-01 DIAGNOSIS — E871 Hypo-osmolality and hyponatremia: Secondary | ICD-10-CM | POA: Diagnosis present

## 2014-09-01 DIAGNOSIS — D630 Anemia in neoplastic disease: Secondary | ICD-10-CM | POA: Diagnosis present

## 2014-09-01 DIAGNOSIS — Z113 Encounter for screening for infections with a predominantly sexual mode of transmission: Secondary | ICD-10-CM

## 2014-09-01 DIAGNOSIS — D61818 Other pancytopenia: Secondary | ICD-10-CM | POA: Diagnosis not present

## 2014-09-01 DIAGNOSIS — A028 Other specified salmonella infections: Secondary | ICD-10-CM | POA: Diagnosis not present

## 2014-09-01 DIAGNOSIS — I480 Paroxysmal atrial fibrillation: Secondary | ICD-10-CM | POA: Diagnosis present

## 2014-09-01 DIAGNOSIS — Z7982 Long term (current) use of aspirin: Secondary | ICD-10-CM | POA: Diagnosis not present

## 2014-09-01 DIAGNOSIS — Z87891 Personal history of nicotine dependence: Secondary | ICD-10-CM | POA: Diagnosis not present

## 2014-09-01 DIAGNOSIS — Z79891 Long term (current) use of opiate analgesic: Secondary | ICD-10-CM

## 2014-09-01 DIAGNOSIS — A4150 Gram-negative sepsis, unspecified: Secondary | ICD-10-CM | POA: Diagnosis not present

## 2014-09-01 DIAGNOSIS — K210 Gastro-esophageal reflux disease with esophagitis: Secondary | ICD-10-CM | POA: Diagnosis not present

## 2014-09-01 DIAGNOSIS — R339 Retention of urine, unspecified: Secondary | ICD-10-CM | POA: Diagnosis present

## 2014-09-01 DIAGNOSIS — I248 Other forms of acute ischemic heart disease: Secondary | ICD-10-CM | POA: Diagnosis present

## 2014-09-01 DIAGNOSIS — L270 Generalized skin eruption due to drugs and medicaments taken internally: Secondary | ICD-10-CM | POA: Diagnosis not present

## 2014-09-01 DIAGNOSIS — E43 Unspecified severe protein-calorie malnutrition: Secondary | ICD-10-CM | POA: Diagnosis present

## 2014-09-01 DIAGNOSIS — Z79899 Other long term (current) drug therapy: Secondary | ICD-10-CM | POA: Diagnosis not present

## 2014-09-01 DIAGNOSIS — I5021 Acute systolic (congestive) heart failure: Secondary | ICD-10-CM | POA: Diagnosis present

## 2014-09-01 DIAGNOSIS — R410 Disorientation, unspecified: Secondary | ICD-10-CM | POA: Diagnosis present

## 2014-09-01 DIAGNOSIS — R079 Chest pain, unspecified: Secondary | ICD-10-CM | POA: Diagnosis not present

## 2014-09-01 DIAGNOSIS — I129 Hypertensive chronic kidney disease with stage 1 through stage 4 chronic kidney disease, or unspecified chronic kidney disease: Secondary | ICD-10-CM | POA: Diagnosis present

## 2014-09-01 DIAGNOSIS — I10 Essential (primary) hypertension: Secondary | ICD-10-CM | POA: Diagnosis not present

## 2014-09-01 DIAGNOSIS — B379 Candidiasis, unspecified: Secondary | ICD-10-CM | POA: Diagnosis present

## 2014-09-01 DIAGNOSIS — E669 Obesity, unspecified: Secondary | ICD-10-CM | POA: Diagnosis present

## 2014-09-01 DIAGNOSIS — R509 Fever, unspecified: Secondary | ICD-10-CM

## 2014-09-01 DIAGNOSIS — E860 Dehydration: Secondary | ICD-10-CM | POA: Diagnosis present

## 2014-09-01 DIAGNOSIS — G629 Polyneuropathy, unspecified: Secondary | ICD-10-CM | POA: Diagnosis present

## 2014-09-01 DIAGNOSIS — B449 Aspergillosis, unspecified: Secondary | ICD-10-CM | POA: Diagnosis present

## 2014-09-01 DIAGNOSIS — N179 Acute kidney failure, unspecified: Secondary | ICD-10-CM | POA: Diagnosis not present

## 2014-09-01 DIAGNOSIS — T80219A Unspecified infection due to central venous catheter, initial encounter: Secondary | ICD-10-CM | POA: Diagnosis present

## 2014-09-01 DIAGNOSIS — R5381 Other malaise: Secondary | ICD-10-CM | POA: Diagnosis present

## 2014-09-01 DIAGNOSIS — Z8701 Personal history of pneumonia (recurrent): Secondary | ICD-10-CM

## 2014-09-01 DIAGNOSIS — D61810 Antineoplastic chemotherapy induced pancytopenia: Secondary | ICD-10-CM | POA: Diagnosis present

## 2014-09-01 DIAGNOSIS — K589 Irritable bowel syndrome without diarrhea: Secondary | ICD-10-CM | POA: Diagnosis present

## 2014-09-01 DIAGNOSIS — Y848 Other medical procedures as the cause of abnormal reaction of the patient, or of later complication, without mention of misadventure at the time of the procedure: Secondary | ICD-10-CM | POA: Diagnosis present

## 2014-09-01 DIAGNOSIS — R1013 Epigastric pain: Secondary | ICD-10-CM

## 2014-09-01 DIAGNOSIS — R5081 Fever presenting with conditions classified elsewhere: Secondary | ICD-10-CM | POA: Diagnosis present

## 2014-09-01 DIAGNOSIS — R6521 Severe sepsis with septic shock: Secondary | ICD-10-CM | POA: Diagnosis not present

## 2014-09-01 DIAGNOSIS — B370 Candidal stomatitis: Secondary | ICD-10-CM | POA: Diagnosis present

## 2014-09-01 DIAGNOSIS — Z7952 Long term (current) use of systemic steroids: Secondary | ICD-10-CM

## 2014-09-01 DIAGNOSIS — R1110 Vomiting, unspecified: Secondary | ICD-10-CM

## 2014-09-01 DIAGNOSIS — C801 Malignant (primary) neoplasm, unspecified: Secondary | ICD-10-CM | POA: Diagnosis not present

## 2014-09-01 DIAGNOSIS — Z8249 Family history of ischemic heart disease and other diseases of the circulatory system: Secondary | ICD-10-CM

## 2014-09-01 DIAGNOSIS — Z6829 Body mass index (BMI) 29.0-29.9, adult: Secondary | ICD-10-CM

## 2014-09-01 DIAGNOSIS — R0902 Hypoxemia: Secondary | ICD-10-CM

## 2014-09-01 DIAGNOSIS — T361X5A Adverse effect of cephalosporins and other beta-lactam antibiotics, initial encounter: Secondary | ICD-10-CM | POA: Diagnosis not present

## 2014-09-01 DIAGNOSIS — E876 Hypokalemia: Secondary | ICD-10-CM | POA: Diagnosis present

## 2014-09-01 DIAGNOSIS — D709 Neutropenia, unspecified: Secondary | ICD-10-CM | POA: Diagnosis not present

## 2014-09-01 DIAGNOSIS — R110 Nausea: Secondary | ICD-10-CM | POA: Diagnosis not present

## 2014-09-01 DIAGNOSIS — R5383 Other fatigue: Secondary | ICD-10-CM | POA: Diagnosis present

## 2014-09-01 DIAGNOSIS — I447 Left bundle-branch block, unspecified: Secondary | ICD-10-CM | POA: Diagnosis present

## 2014-09-01 DIAGNOSIS — A021 Salmonella sepsis: Principal | ICD-10-CM | POA: Diagnosis present

## 2014-09-01 DIAGNOSIS — R072 Precordial pain: Secondary | ICD-10-CM | POA: Diagnosis not present

## 2014-09-01 DIAGNOSIS — K219 Gastro-esophageal reflux disease without esophagitis: Secondary | ICD-10-CM | POA: Diagnosis present

## 2014-09-01 DIAGNOSIS — T451X5A Adverse effect of antineoplastic and immunosuppressive drugs, initial encounter: Secondary | ICD-10-CM | POA: Diagnosis present

## 2014-09-01 DIAGNOSIS — I4891 Unspecified atrial fibrillation: Secondary | ICD-10-CM | POA: Diagnosis not present

## 2014-09-01 DIAGNOSIS — B3781 Candidal esophagitis: Secondary | ICD-10-CM

## 2014-09-01 DIAGNOSIS — G620 Drug-induced polyneuropathy: Secondary | ICD-10-CM | POA: Diagnosis not present

## 2014-09-01 DIAGNOSIS — C9000 Multiple myeloma not having achieved remission: Secondary | ICD-10-CM | POA: Diagnosis present

## 2014-09-01 DIAGNOSIS — G8929 Other chronic pain: Secondary | ICD-10-CM | POA: Diagnosis present

## 2014-09-01 DIAGNOSIS — R233 Spontaneous ecchymoses: Secondary | ICD-10-CM | POA: Diagnosis not present

## 2014-09-01 DIAGNOSIS — R51 Headache: Secondary | ICD-10-CM | POA: Diagnosis not present

## 2014-09-01 DIAGNOSIS — R7881 Bacteremia: Secondary | ICD-10-CM | POA: Diagnosis not present

## 2014-09-01 DIAGNOSIS — R0602 Shortness of breath: Secondary | ICD-10-CM

## 2014-09-01 DIAGNOSIS — R21 Rash and other nonspecific skin eruption: Secondary | ICD-10-CM | POA: Diagnosis not present

## 2014-09-01 DIAGNOSIS — A282 Extraintestinal yersiniosis: Secondary | ICD-10-CM | POA: Diagnosis not present

## 2014-09-01 DIAGNOSIS — R0600 Dyspnea, unspecified: Secondary | ICD-10-CM | POA: Diagnosis not present

## 2014-09-01 DIAGNOSIS — R000 Tachycardia, unspecified: Secondary | ICD-10-CM

## 2014-09-01 HISTORY — DX: Candidal esophagitis: B37.81

## 2014-09-01 HISTORY — DX: Bacteremia: R78.81

## 2014-09-01 HISTORY — DX: Other forms of acute ischemic heart disease: I24.8

## 2014-09-01 HISTORY — DX: Neutropenia, unspecified: D70.9

## 2014-09-01 HISTORY — DX: Other disorders of peripheral nervous system: G64

## 2014-09-01 HISTORY — DX: Paroxysmal atrial fibrillation: I48.0

## 2014-09-01 HISTORY — DX: Noninfective gastroenteritis and colitis, unspecified: K52.9

## 2014-09-01 HISTORY — DX: Candidal stomatitis: B37.0

## 2014-09-01 HISTORY — DX: Chronic kidney disease, stage 3 unspecified: N18.30

## 2014-09-01 HISTORY — DX: Fever presenting with conditions classified elsewhere: R50.81

## 2014-09-01 HISTORY — DX: Acute systolic (congestive) heart failure: I50.21

## 2014-09-01 HISTORY — DX: Chronic kidney disease, stage 3 (moderate): N18.3

## 2014-09-01 HISTORY — DX: Unspecified severe protein-calorie malnutrition: E43

## 2014-09-01 HISTORY — DX: Aspergillosis, unspecified: B44.9

## 2014-09-01 HISTORY — DX: Essential (primary) hypertension: I10

## 2014-09-01 HISTORY — DX: Multiple myeloma not having achieved remission: C90.00

## 2014-09-01 LAB — BASIC METABOLIC PANEL
ANION GAP: 9 (ref 5–15)
BUN: 17 mg/dL (ref 6–20)
CHLORIDE: 104 mmol/L (ref 101–111)
CO2: 24 mmol/L (ref 22–32)
Calcium: 7.6 mg/dL — ABNORMAL LOW (ref 8.9–10.3)
Creatinine, Ser: 1.26 mg/dL — ABNORMAL HIGH (ref 0.61–1.24)
GFR calc Af Amer: >60 mL/min (ref >60)
GFR calc non Af Amer: 59 mL/min — ABNORMAL LOW (ref >60)
GLUCOSE: 122 mg/dL — AB (ref 65–99)
POTASSIUM: 4.4 mmol/L (ref 3.5–5.1)
SODIUM: 137 mmol/L (ref 135–145)

## 2014-09-01 LAB — I-STAT TROPONIN, ED: TROPONIN I, POC: 0.02 ng/mL (ref 0.00–0.08)

## 2014-09-01 LAB — CBC
HCT: 28.7 % — ABNORMAL LOW (ref 39.0–52.0)
Hemoglobin: 9.3 g/dL — ABNORMAL LOW (ref 13.0–17.0)
MCH: 29 pg (ref 26.0–34.0)
MCHC: 32.4 g/dL (ref 30.0–36.0)
MCV: 89.4 fL (ref 78.0–100.0)
PLATELETS: 109 10*3/uL — AB (ref 150–400)
RBC: 3.21 MIL/uL — AB (ref 4.22–5.81)
RDW: 17.2 % — ABNORMAL HIGH (ref 11.5–15.5)
WBC: 2.4 10*3/uL — AB (ref 4.0–10.5)

## 2014-09-01 LAB — BRAIN NATRIURETIC PEPTIDE: B NATRIURETIC PEPTIDE 5: 452.2 pg/mL — AB (ref 0.0–100.0)

## 2014-09-01 LAB — TROPONIN I
TROPONIN I: 0.08 ng/mL — AB (ref <0.031)
Troponin I: 0.09 ng/mL — ABNORMAL HIGH (ref <0.031)

## 2014-09-01 MED ORDER — ONDANSETRON HCL 4 MG/2ML IJ SOLN
4.0000 mg | Freq: Four times a day (QID) | INTRAMUSCULAR | Status: DC | PRN
  Administered 2014-09-02 – 2014-09-12 (×15): 4 mg via INTRAVENOUS
  Filled 2014-09-01 (×16): qty 2

## 2014-09-01 MED ORDER — FENTANYL 25 MCG/HR TD PT72: 25.0000 ug | MEDICATED_PATCH | TRANSDERMAL | Status: DC

## 2014-09-01 MED ORDER — ACETAMINOPHEN 650 MG RE SUPP
650.0000 mg | Freq: Four times a day (QID) | RECTAL | Status: DC | PRN
  Administered 2014-09-03 – 2014-09-06 (×3): 650 mg via RECTAL
  Filled 2014-09-01 (×3): qty 1

## 2014-09-01 MED ORDER — ASPIRIN 81 MG PO CHEW
81.0000 mg | CHEWABLE_TABLET | Freq: Every day | ORAL | Status: DC
  Administered 2014-09-01 – 2014-09-12 (×11): 81 mg via ORAL
  Filled 2014-09-01 (×14): qty 1

## 2014-09-01 MED ORDER — SODIUM CHLORIDE 0.9 % IJ SOLN: 3.0000 mL | INTRAMUSCULAR | Status: DC | PRN

## 2014-09-01 MED ORDER — OXYCODONE HCL 5 MG PO TABS
5.0000 mg | ORAL_TABLET | ORAL | Status: DC | PRN
  Administered 2014-09-02: 5 mg via ORAL
  Administered 2014-09-02 – 2014-09-03 (×2): 10 mg via ORAL
  Administered 2014-09-04 (×2): 5 mg via ORAL
  Administered 2014-09-05: 10 mg via ORAL
  Administered 2014-09-07 – 2014-09-08 (×4): 5 mg via ORAL
  Administered 2014-09-09 – 2014-09-10 (×3): 10 mg via ORAL
  Filled 2014-09-01: qty 2
  Filled 2014-09-01 (×5): qty 1
  Filled 2014-09-01: qty 2
  Filled 2014-09-01: qty 1
  Filled 2014-09-01 (×2): qty 2
  Filled 2014-09-01: qty 1
  Filled 2014-09-01 (×2): qty 2

## 2014-09-01 MED ORDER — PREGABALIN 75 MG PO CAPS
75.0000 mg | ORAL_CAPSULE | Freq: Two times a day (BID) | ORAL | Status: DC
  Administered 2014-09-01 – 2014-09-13 (×21): 75 mg via ORAL
  Filled 2014-09-01 (×24): qty 1

## 2014-09-01 MED ORDER — CARVEDILOL 6.25 MG PO TABS: 6.2500 mg | ORAL_TABLET | Freq: Two times a day (BID) | ORAL | Status: DC

## 2014-09-01 MED ORDER — IRBESARTAN 150 MG PO TABS
150.0000 mg | ORAL_TABLET | Freq: Every day | ORAL | Status: DC
  Administered 2014-09-02 – 2014-09-03 (×2): 150 mg via ORAL
  Filled 2014-09-01 (×6): qty 1

## 2014-09-01 MED ORDER — GI COCKTAIL ~~LOC~~
30.0000 mL | Freq: Four times a day (QID) | ORAL | Status: DC | PRN
  Filled 2014-09-01: qty 30

## 2014-09-01 MED ORDER — ALLOPURINOL 300 MG PO TABS
300.0000 mg | ORAL_TABLET | Freq: Every day | ORAL | Status: DC
  Administered 2014-09-02 – 2014-09-12 (×10): 300 mg via ORAL
  Filled 2014-09-01: qty 1
  Filled 2014-09-01: qty 3
  Filled 2014-09-01: qty 1
  Filled 2014-09-01: qty 3
  Filled 2014-09-01: qty 1
  Filled 2014-09-01 (×2): qty 3
  Filled 2014-09-01: qty 1
  Filled 2014-09-01 (×3): qty 3
  Filled 2014-09-01: qty 1
  Filled 2014-09-01: qty 3

## 2014-09-01 MED ORDER — POTASSIUM CHLORIDE ER 10 MEQ PO TBCR
10.0000 meq | EXTENDED_RELEASE_TABLET | Freq: Every day | ORAL | Status: DC
  Administered 2014-09-01 – 2014-09-05 (×5): 10 meq via ORAL
  Filled 2014-09-01 (×12): qty 1

## 2014-09-01 MED ORDER — ACETAMINOPHEN 325 MG PO TABS
650.0000 mg | ORAL_TABLET | ORAL | Status: DC | PRN
  Administered 2014-09-02 – 2014-09-10 (×11): 650 mg via ORAL
  Filled 2014-09-01 (×13): qty 2

## 2014-09-01 MED ORDER — PANTOPRAZOLE SODIUM 40 MG PO TBEC
80.0000 mg | DELAYED_RELEASE_TABLET | Freq: Every day | ORAL | Status: DC
  Administered 2014-09-02 – 2014-09-12 (×10): 80 mg via ORAL
  Filled 2014-09-01 (×12): qty 2

## 2014-09-01 MED ORDER — HEPARIN SODIUM (PORCINE) 5000 UNIT/ML IJ SOLN
5000.0000 [IU] | Freq: Three times a day (TID) | INTRAMUSCULAR | Status: DC
  Administered 2014-09-01 – 2014-09-02 (×2): 5000 [IU] via SUBCUTANEOUS
  Filled 2014-09-01 (×3): qty 1

## 2014-09-01 MED ORDER — SODIUM CHLORIDE 0.9 % IV SOLN
250.0000 mL | INTRAVENOUS | Status: DC | PRN
  Administered 2014-09-11: 250 mL via INTRAVENOUS

## 2014-09-01 MED ORDER — ACETAMINOPHEN 325 MG PO TABS: 650.0000 mg | ORAL_TABLET | Freq: Four times a day (QID) | ORAL | Status: DC | PRN

## 2014-09-01 MED ORDER — SODIUM CHLORIDE 0.9 % IJ SOLN
3.0000 mL | Freq: Two times a day (BID) | INTRAMUSCULAR | Status: DC
  Administered 2014-09-01 – 2014-09-12 (×15): 3 mL via INTRAVENOUS

## 2014-09-01 MED ORDER — FUROSEMIDE 40 MG PO TABS
40.0000 mg | ORAL_TABLET | Freq: Every day | ORAL | Status: DC
  Administered 2014-09-01: 40 mg via ORAL
  Filled 2014-09-01: qty 1

## 2014-09-01 MED ORDER — CARVEDILOL 6.25 MG PO TABS
6.2500 mg | ORAL_TABLET | Freq: Two times a day (BID) | ORAL | Status: DC
  Administered 2014-09-01 – 2014-09-07 (×10): 6.25 mg via ORAL
  Filled 2014-09-01 (×12): qty 1

## 2014-09-01 NOTE — ED Notes (Addendum)
Pt back from CT

## 2014-09-01 NOTE — ED Notes (Signed)
md at bedside

## 2014-09-01 NOTE — ED Notes (Addendum)
Pt to xray

## 2014-09-01 NOTE — ED Notes (Signed)
Pt to CT

## 2014-09-01 NOTE — ED Notes (Signed)
Pt alert and oriented x4. Respirations even and unlabored, bilateral symmetrical rise and fall of chest. Skin warm and dry. In no acute distress. Denies needs.   

## 2014-09-01 NOTE — ED Notes (Signed)
hospitalist at bedside

## 2014-09-01 NOTE — ED Notes (Signed)
Pt c/o dull generalized chest pain, SOB, dizziness, and diaphoresis starting around 0730.  Pt reports similar episodes intermittently since beginning chemo.  Pt sts "it starts when I exert myself."  Pt is treated at [facility] and took PO chemo yesterday.  Pt is on a clinical trial. Hx of myeloma.

## 2014-09-01 NOTE — ED Notes (Signed)
Pt to floor at 18:50 

## 2014-09-01 NOTE — ED Provider Notes (Signed)
CSN: [id number]     Arrival date & time 09/01/14  1334 History   First MD Initiated Contact with Patient 09/01/14 1453     Chief Complaint  Patient presents with  . Chest Pain  . Shortness of Breath  . Dizziness     (Consider location/radiation/quality/duration/timing/severity/associated sxs/prior Treatment) HPI Comments: Patient presents to the emergency department for evaluation of chest pain and shortness of breath. Patient reports that he has been experiencing this for "a long time", but in the last 2 days symptoms have worsened. He reports pain across his upper chest with exertion that is relieved with rest. He has not had any fever or cough associated with the symptoms. Patient is currently receiving chemotherapy for multiple myeloma, last dose yesterday at [facility].  Patient reports multiple cardiac workups that were negative. He reports cardiac catheterization approximately 6 years ago that did not show any disease. He has had 2 stress tests since then, most recently 1 year ago.  Patient does report a previous history of aspergillosis, approximately 3 years ago. He reports that a biopsy had to be performed to diagnose.   Past Medical History  Diagnosis Date  . Hypertension   . Cancer    Past Surgical History  Procedure Laterality Date  . Portacath placement      right chest   History reviewed. No pertinent family history. History  Substance Use Topics  . Smoking status: Former Smoker    Quit date: 09/29/2002  . Smokeless tobacco: Never Used  . Alcohol Use: No    Review of Systems  Constitutional: Negative for fever.  Respiratory: Positive for shortness of breath.   Cardiovascular: Positive for chest pain.  All other systems reviewed and are negative.     Allergies  Review of patient's allergies indicates no known allergies.  Home Medications   Prior to Admission medications   Medication Sig Start Date End Date Taking? Authorizing Provider  acetaminophen  (TYLENOL) 325 MG tablet Take 2 tablets by mouth every 4 (four) hours as needed. Pain/fever   Yes Historical Provider, MD  acyclovir (ZOVIRAX) 400 MG tablet Take 400 mg by mouth every 12 (twelve) hours. Take 1 tablet by mouth every 12 hours to prevent shingles while on chemo   Yes Historical Provider, MD  allopurinol (ZYLOPRIM) 300 MG tablet Take 1 tablet by mouth daily. 07/30/14 07/30/15 Yes Historical Provider, MD  aspirin 81 MG chewable tablet Chew 81 mg by mouth daily.   Yes Historical Provider, MD  carvedilol (COREG) 6.25 MG tablet Take 6.25 mg by mouth 2 (two) times daily with a meal.   Yes Historical Provider, MD  diphenoxylate-atropine (LOMOTIL) 2.5-0.025 MG per tablet Take 1 tablet by mouth 4 (four) times daily as needed for diarrhea or loose stools.   Yes Historical Provider, MD  esomeprazole (NEXIUM) 40 MG capsule Take 40 mg by mouth daily at 12 noon.   Yes Historical Provider, MD  fentaNYL (DURAGESIC - DOSED MCG/HR) 25 MCG/HR patch Place 1 patch (25 mcg total) onto the skin every 3 (three) days. 07/01/14  Yes Ladell Pier, MD  furosemide (LASIX) 40 MG tablet TAKE 1 TABLET BY MOUTH EVERY DAY 04/02/14  Yes Ladell Pier, MD  nitroGLYCERIN (NITROSTAT) 0.3 MG SL tablet Place 1 tablet (0.3 mg total) under the tongue every 5 (five) minutes as needed for chest pain. No more than 3 doses in period of 15 minutes per episode. 09/28/12  Yes Antonietta Breach, PA-C  ondansetron (ZOFRAN) 8 MG tablet Take 1  tablet (8 mg total) by mouth every 12 (twelve) hours as needed for nausea. 11/29/13  Yes Ladell Pier, MD  oxyCODONE (OXY IR/ROXICODONE) 5 MG immediate release tablet Take 1 tablet by mouth every 4 (four) hours as needed. pain 07/30/14  Yes Historical Provider, MD  potassium chloride (K-DUR) 10 MEQ tablet Take 1 tablet (10 mEq total) by mouth daily. 01/02/14  Yes Thayer Headings, MD  pregabalin (LYRICA) 75 MG capsule Take 1 capsule (75 mg total) by mouth 2 (two) times daily. 05/15/14  Yes Ladell Pier, MD   valsartan (DIOVAN) 160 MG tablet Take 1 tablet (160 mg total) by mouth daily. 01/02/14  Yes Thayer Headings, MD  levofloxacin (LEVAQUIN) 750 MG tablet Take 1 tablet (750 mg total) by mouth daily. Patient not taking: Reported on 09/01/2014 08/06/14   Everlene Balls, MD  PRESCRIPTION MEDICATION Inject 60 mg into the vein once. carfilzomib (KYPROLIS) 60 mg in dextrose 5 % 50 mL chemo infusion 27 mg/m2  2.2 m2 (Treatment Plan Actual) Once 09/27/2012    Historical Provider, MD  PROAIR HFA 108 (90 BASE) MCG/ACT inhaler  08/08/14   Historical Provider, MD   BP 98/55 mmHg  Pulse 81  Temp(Src) 97.9 F (36.6 C) (Oral)  Resp 21  SpO2 96% Physical Exam  Constitutional: He is oriented to person, place, and time. He appears well-developed and well-nourished. No distress.  HENT:  Head: Normocephalic and atraumatic.  Right Ear: Hearing normal.  Left Ear: Hearing normal.  Nose: Nose normal.  Mouth/Throat: Oropharynx is clear and moist and mucous membranes are normal.  Eyes: Conjunctivae and EOM are normal. Pupils are equal, round, and reactive to light.  Neck: Normal range of motion. Neck supple.  Cardiovascular: Regular rhythm, S1 normal and S2 normal.  Exam reveals no gallop and no friction rub.   No murmur heard. Pulmonary/Chest: Effort normal and breath sounds normal. No respiratory distress. He exhibits no tenderness.  Abdominal: Soft. Normal appearance and bowel sounds are normal. There is no hepatosplenomegaly. There is no tenderness. There is no rebound, no guarding, no tenderness at McBurney's point and negative Murphy's sign. No hernia.  Musculoskeletal: Normal range of motion.  Neurological: He is alert and oriented to person, place, and time. He has normal strength. No cranial nerve deficit or sensory deficit. Coordination normal. GCS eye subscore is 4. GCS verbal subscore is 5. GCS motor subscore is 6.  Skin: Skin is warm, dry and intact. No rash noted. No cyanosis.  Psychiatric: He has a normal  mood and affect. His speech is normal and behavior is normal. Thought content normal.  Nursing note and vitals reviewed.   ED Course  Procedures (including critical care time) Labs Review Labs Reviewed  CBC - Abnormal; Notable for the following:    WBC 2.4 (*)    RBC 3.21 (*)    Hemoglobin 9.3 (*)    HCT 28.7 (*)    RDW 17.2 (*)    Platelets 109 (*)    All other components within normal limits  BASIC METABOLIC PANEL - Abnormal; Notable for the following:    Glucose, Bld 122 (*)    Creatinine, Ser 1.26 (*)    Calcium 7.6 (*)    GFR calc non Af Amer 59 (*)    All other components within normal limits  BRAIN NATRIURETIC PEPTIDE  I-STAT TROPOININ, ED    Imaging Review No results found.   EKG Interpretation   Date/Time:  Monday September 01 2014 13:44:23 EDT Ventricular  Rate:  78 PR Interval:  162 QRS Duration: 149 QT Interval:  438 QTC Calculation: 499 R Axis:   -2 Text Interpretation:  Sinus rhythm IVCD, consider atypical LBBB No  significant change since last tracing Confirmed by Beaver  [phone]) on 09/01/2014 1:53:10 PM      MDM   Final diagnoses:  None   chest pain  Presents to the emergency department for evaluation of exertional chest pain. Symptoms have been ongoing for several months but have worsened in the last couple of days. Patient reports central chest pain, shortness of breath, nausea, diaphoresis with exertion that improves when he rests.  Reviewing the patient's records reveals that he was diagnosed with multifocal pneumonia last month. He was treated with Levaquin. Chest x-ray was clear. CT scan was performed to further evaluate. There is no residual pneumonia noted. Noncontrast CT was performed because a do not feel the patient has a PE. Patient did have a PE workup at his previous presentation and it was negative. Symptoms have been ongoing for months, not consistent with PE.  Reviewing his records reveals that he has had a nuclear medicine  stress test each of the last 2 years. Neither showed any evidence of ischemia. His symptoms today, however, are suspicious for cardiac etiology. Will ask for admission to hospitalist service for cardiac rule out, possible cardiology evaluation. Patient may benefit from cardiac catheterization because he has had negative stress tests with continued symptoms.    Orpah Greek, MD 09/01/14 [phone]

## 2014-09-01 NOTE — H&P (Signed)
[facility] History and Physical  Walter Dennis IDP:[id number] DOB: 1950-08-07 DOA: 09/01/2014  Referring physician: Dr Eston Esters  PCP: Effie Berkshire A   Chief Complaint: CP  HPI: Walter Dennis is a 65 y.o. male  CP started last night. Intermittent and worse w/ exertion. Associated w/ chills and clamy feeling and SOB. Complete resolution after about 1 hr rest. Typically w/ an active lifestyle. Over the past few weeks pt has had difficulty climbing stairs w/o getting winded.  Upper middle of chest. burining in nature. Feels hoarse after episode. Radiates up to L shoulder and L arm. Currently w/ minimal CP.  Last cardiac cath 2012 - nml. Myoview performed 1` year ago and was nml.  Treated for PNeumonia 3 wks ago and finished Levaquin 2 wks ago.   Review of Systems:  Constitutional:  No weight loss, night sweats, Fevers, fatigue.  HEENT:  No headaches, Difficulty swallowing,Tooth/dental problems,Sore throat,  No sneezing, itching, ear ache, nasal congestion, post nasal drip,  Cardio-vascular: Per HPI GI:  No heartburn, indigestion, abdominal pain, nausea, vomiting, diarrhea, change in bowel habits, loss of appetite  Resp: P{er HPI Skin:  no rash or lesions.  GU:  no dysuria, change in color of urine, no urgency or frequency. No flank pain.  Musculoskeletal:   No joint pain or swelling. No decreased range of motion. No back pain.  Psych:  No change in mood or affect. No depression or anxiety. No memory loss.   Past Medical History  Diagnosis Date  . Hypertension   . Cancer     Myeloma   Past Surgical History  Procedure Laterality Date  . Portacath placement      right chest  . Cardiac catheterization      2012 - nml in [place] History:  reports that he quit smoking about 11 years ago. He has never used smokeless tobacco. He reports that he does not drink alcohol or use illicit drugs.  No Known Allergies  Family History  Problem Relation Age of Onset    . Hypertension Mother   . Hypertension Father   . Hypertension Sister   . Hypertension Brother   . Heart disease Father      Prior to Admission medications   Medication Sig Start Date End Date Taking? Authorizing Provider  acetaminophen (TYLENOL) 325 MG tablet Take 2 tablets by mouth every 4 (four) hours as needed. Pain/fever   Yes Historical Provider, MD  acyclovir (ZOVIRAX) 400 MG tablet Take 400 mg by mouth every 12 (twelve) hours. Take 1 tablet by mouth every 12 hours to prevent shingles while on chemo   Yes Historical Provider, MD  allopurinol (ZYLOPRIM) 300 MG tablet Take 1 tablet by mouth daily. 07/30/14 07/30/15 Yes Historical Provider, MD  aspirin 81 MG chewable tablet Chew 81 mg by mouth daily.   Yes Historical Provider, MD  carvedilol (COREG) 6.25 MG tablet Take 6.25 mg by mouth 2 (two) times daily with a meal.   Yes Historical Provider, MD  dexamethasone (DECADRON) 4 MG tablet Take 40 mg by mouth once a week. Every Friday 08/29/14 09/01/14 Yes Historical Provider, MD  diphenoxylate-atropine (LOMOTIL) 2.5-0.025 MG per tablet Take 1 tablet by mouth 4 (four) times daily as needed for diarrhea or loose stools.   Yes Historical Provider, MD  esomeprazole (NEXIUM) 40 MG capsule Take 40 mg by mouth daily at 12 noon.   Yes Historical Provider, MD  fentaNYL (DURAGESIC - DOSED MCG/HR) 25 MCG/HR patch Place 1  patch (25 mcg total) onto the skin every 3 (three) days. 07/01/14  Yes Ladell Pier, MD  furosemide (LASIX) 40 MG tablet TAKE 1 TABLET BY MOUTH EVERY DAY 04/02/14  Yes Ladell Pier, MD  nitroGLYCERIN (NITROSTAT) 0.3 MG SL tablet Place 1 tablet (0.3 mg total) under the tongue every 5 (five) minutes as needed for chest pain. No more than 3 doses in period of 15 minutes per episode. 09/28/12  Yes Antonietta Breach, PA-C  ondansetron (ZOFRAN) 8 MG tablet Take 1 tablet (8 mg total) by mouth every 12 (twelve) hours as needed for nausea. 11/29/13  Yes Ladell Pier, MD  oxyCODONE (OXY IR/ROXICODONE)  5 MG immediate release tablet Take 1-2 tablets by mouth every 4 (four) hours as needed for breakthrough pain. pain 07/30/14  Yes Historical Provider, MD  potassium chloride (K-DUR) 10 MEQ tablet Take 1 tablet (10 mEq total) by mouth daily. 01/02/14  Yes Thayer Headings, MD  pregabalin (LYRICA) 75 MG capsule Take 1 capsule (75 mg total) by mouth 2 (two) times daily. 05/15/14  Yes Ladell Pier, MD  PRESCRIPTION MEDICATION Combination of Bendamustine and Pomalidomide- study Chemo   Yes Historical Provider, MD  PROAIR HFA 108 (90 BASE) MCG/ACT inhaler Inhale 1-2 puffs into the lungs every 6 (six) hours as needed for wheezing or shortness of breath.  08/08/14  Yes Historical Provider, MD  valsartan (DIOVAN) 160 MG tablet Take 1 tablet (160 mg total) by mouth daily. 01/02/14  Yes Thayer Headings, MD  levofloxacin (LEVAQUIN) 750 MG tablet Take 1 tablet (750 mg total) by mouth daily. Patient not taking: Reported on 09/01/2014 08/06/14   Everlene Balls, MD   Physical Exam: Filed Vitals:   09/01/14 1742 09/01/14 1800 09/01/14 1844 09/01/14 1902  BP:  123/53 126/60 138/71  Pulse: 81  90 82  Temp: 98.2 F (36.8 C)  98.2 F (36.8 C) 98.3 F (36.8 C)  TempSrc: Oral  Oral Oral  Resp: 17 16 11 16   Height:    6' (1.829 m)  Weight:    99.791 kg (220 lb)  SpO2: 98%  98% 100%    Wt Readings from Last 3 Encounters:  09/01/14 99.791 kg (220 lb)  08/12/14 99.837 kg (220 lb 1.6 oz)  06/23/14 100.653 kg (221 lb 14.4 oz)    General:  Appears calm and comfortable Eyes:  PERRL, normal lids, irises & conjunctiva ENT:  grossly normal hearing, lips & tongue Neck:  no LAD, masses or thyromegaly Cardiovascular:  RRR, no m/r/g. No LE edema. Telemetry:  SR, no arrhythmias  Respiratory:  CTA bilaterally, no w/r/r. Normal respiratory effort. Abdomen:  soft, ntnd Skin:  no rash or induration seen on limited exam Musculoskeletal:  grossly normal tone BUE/BLE Psychiatric:  grossly normal mood and affect, speech fluent  and appropriate Neurologic:  grossly non-focal.          Labs on Admission:  Basic Metabolic Panel:  Recent Labs Lab 09/01/14 1359  NA 137  K 4.4  CL 104  CO2 24  GLUCOSE 122*  BUN 17  CREATININE 1.26*  CALCIUM 7.6*   Liver Function Tests: No results for input(s): AST, ALT, ALKPHOS, BILITOT, PROT, ALBUMIN in the last 168 hours. No results for input(s): LIPASE, AMYLASE in the last 168 hours. No results for input(s): AMMONIA in the last 168 hours. CBC:  Recent Labs Lab 09/01/14 1359  WBC 2.4*  HGB 9.3*  HCT 28.7*  MCV 89.4  PLT 109*   Cardiac Enzymes:  No results for input(s): CKTOTAL, CKMB, CKMBINDEX, TROPONINI in the last 168 hours.  BNP (last 3 results)  Recent Labs  09/01/14 1359  BNP 452.2*    ProBNP (last 3 results) No results for input(s): PROBNP in the last 8760 hours.  CBG: No results for input(s): GLUCAP in the last 168 hours.  Radiological Exams on Admission: Dg Chest 2 View  09/01/2014   CLINICAL DATA:  Shortness of breath and chest pain for 2 days  EXAM: CHEST - 2 VIEW  COMPARISON:  08/08/2014  FINDINGS: Cardiac shadow is at the upper limits of normal in size but stable. The lungs are well aerated bilaterally. No focal infiltrate or sizable effusion is seen. A dual-lumen chest wall port is noted on the right stable from the prior exam. No acute bony abnormality is seen per  IMPRESSION: No active disease.   Electronically Signed   By: Inez Catalina M.D.   On: 09/01/2014 15:38   Ct Chest Wo Contrast  09/01/2014   CLINICAL DATA:  Dull generalized chest pain, shortness of breath, diaphoresis starting around 0730 hours. History of myeloma. Ongoing chemotherapy treatment.  EXAM: CT CHEST WITHOUT CONTRAST  TECHNIQUE: Multidetector CT imaging of the chest was performed following the standard protocol without IV contrast.  COMPARISON:  08/05/2014  FINDINGS: CT CHEST FINDINGS  Mediastinum/Nodes: Heart is normal size. Coronary artery calcifications in the 3  major coronary vessels. Aortic calcifications. No aneurysm. Small scattered mediastinal lymph nodes, none pathologically enlarged. No axillary or visible hilar adenopathy.  Lungs/Pleura: Lungs are clear. No focal airspace opacities or suspicious nodules. No effusions.  Chest wall: Right chest wall Port-A-Cath noted in place. The tip is in the SVC. Mild bilateral gynecomastia. Chest wall soft tissues otherwise unremarkable.  Upper abdomen: Mild diffuse fatty infiltration of the liver. No acute findings in the visualized upper abdomen.  Musculoskeletal: No acute bony abnormality.  IMPRESSION: Coronary artery disease.  Fatty infiltration of the liver.  No acute findings.   Electronically Signed   By: Rolm Baptise M.D.   On: 09/01/2014 17:29    EKG: Independently reviewed. Sinus, LBBB, no change since previous. No sign of ACS  Assessment/Plan Principal Problem:   Chest pain Active Problems:   Multiple myeloma   Essential hypertension   CKD (chronic kidney disease) stage 3, GFR 30-59 ml/min   GERD (gastroesophageal reflux disease)   Abdominal pain, chronic, epigastric   Chest pain: HEART score 4. Cardiac versus GI. Unlikely pleuritic the patient with significant pneumonia 4 weeks ago. Cardiac history significant for cardiac catheterization 4 years ago which was normal and Myoview one year ago which was also normal. CT without evidence of PE or recurrent pneumonia. Troponin negative, EKG unchanged from previous showing chronic LBBB. Currently w/ exertional CP that radiates to L shoulder. Feel pt needs further r/o per cards evaluation. BNP elevated to 452 but symptoms and other labs not consistent with acute CHF - Tele - Cardiology consult in the am. Dr. Acie Fredrickson is pts cardiologist. Message sent to Gay Filler through [facility] per protocol.  - GI cocktail - cycle troponins - EKG in am - Cont ASA - Consider echo  MM: ongoing treatment for multiple myeloma. Chemotherapy every Friday. WBC 2.4, PLT 109,  Hgb 9.3 - Next Decadron and chemotherapy due on 09/05/2014. - Continue allopurinol  Hypertension: Normotensive - Continue carvedilol, valsartan  CKD: Cr 1.26 at baseline -BMET in am  Chronic pain: - Continue Lyrica - Continue fentanyl patch  Dependent edema: at baseline - Continue  Lasix  GERD:  - Continue PPI   Code Status: FULL DVT Prophylaxis: Hep Family Communication: None Disposition Plan: Pending improvement  MERRELL, Abie Lenna Sciara, MD Family Medicine [facility] [email or web address] Password [email or web address]

## 2014-09-02 ENCOUNTER — Inpatient Hospital Stay (HOSPITAL_COMMUNITY): Payer: BLUE CROSS/BLUE SHIELD

## 2014-09-02 ENCOUNTER — Other Ambulatory Visit (HOSPITAL_COMMUNITY): Payer: BLUE CROSS/BLUE SHIELD

## 2014-09-02 ENCOUNTER — Encounter (HOSPITAL_COMMUNITY): Payer: Self-pay | Admitting: Radiology

## 2014-09-02 DIAGNOSIS — D72819 Decreased white blood cell count, unspecified: Secondary | ICD-10-CM

## 2014-09-02 DIAGNOSIS — D709 Neutropenia, unspecified: Secondary | ICD-10-CM | POA: Diagnosis present

## 2014-09-02 DIAGNOSIS — B449 Aspergillosis, unspecified: Secondary | ICD-10-CM

## 2014-09-02 DIAGNOSIS — R51 Headache: Secondary | ICD-10-CM

## 2014-09-02 DIAGNOSIS — C9000 Multiple myeloma not having achieved remission: Secondary | ICD-10-CM

## 2014-09-02 DIAGNOSIS — Z113 Encounter for screening for infections with a predominantly sexual mode of transmission: Secondary | ICD-10-CM

## 2014-09-02 DIAGNOSIS — R072 Precordial pain: Secondary | ICD-10-CM

## 2014-09-02 DIAGNOSIS — G620 Drug-induced polyneuropathy: Secondary | ICD-10-CM

## 2014-09-02 DIAGNOSIS — J189 Pneumonia, unspecified organism: Secondary | ICD-10-CM

## 2014-09-02 DIAGNOSIS — D61818 Other pancytopenia: Secondary | ICD-10-CM

## 2014-09-02 DIAGNOSIS — R5081 Fever presenting with conditions classified elsewhere: Secondary | ICD-10-CM

## 2014-09-02 DIAGNOSIS — I10 Essential (primary) hypertension: Secondary | ICD-10-CM

## 2014-09-02 DIAGNOSIS — R5383 Other fatigue: Secondary | ICD-10-CM | POA: Diagnosis present

## 2014-09-02 DIAGNOSIS — R000 Tachycardia, unspecified: Secondary | ICD-10-CM

## 2014-09-02 DIAGNOSIS — C801 Malignant (primary) neoplasm, unspecified: Secondary | ICD-10-CM

## 2014-09-02 DIAGNOSIS — R509 Fever, unspecified: Secondary | ICD-10-CM

## 2014-09-02 DIAGNOSIS — R410 Disorientation, unspecified: Secondary | ICD-10-CM

## 2014-09-02 LAB — COMPREHENSIVE METABOLIC PANEL
ALBUMIN: 2.8 g/dL — AB (ref 3.5–5.0)
ALT: 39 U/L (ref 17–63)
AST: 17 U/L (ref 15–41)
Alkaline Phosphatase: 78 U/L (ref 38–126)
Anion gap: 11 (ref 5–15)
BUN: 18 mg/dL (ref 6–20)
CO2: 24 mmol/L (ref 22–32)
Calcium: 7.7 mg/dL — ABNORMAL LOW (ref 8.9–10.3)
Chloride: 98 mmol/L — ABNORMAL LOW (ref 101–111)
Creatinine, Ser: 1.13 mg/dL (ref 0.61–1.24)
GFR calc Af Amer: >60 mL/min (ref >60)
GFR calc non Af Amer: >60 mL/min (ref >60)
Glucose, Bld: 120 mg/dL — ABNORMAL HIGH (ref 65–99)
POTASSIUM: 3.6 mmol/L (ref 3.5–5.1)
SODIUM: 133 mmol/L — AB (ref 135–145)
TOTAL PROTEIN: 9.1 g/dL — AB (ref 6.5–8.1)
Total Bilirubin: 0.8 mg/dL (ref 0.3–1.2)

## 2014-09-02 LAB — URINE MICROSCOPIC-ADD ON

## 2014-09-02 LAB — PROTEIN AND GLUCOSE, CSF
GLUCOSE CSF: 92 mg/dL — AB (ref 40–70)
Total  Protein, CSF: 29 mg/dL (ref 15–45)

## 2014-09-02 LAB — LIPID PANEL
Cholesterol: 139 mg/dL (ref 0–200)
HDL: 39 mg/dL — AB (ref >40)
LDL Cholesterol: 53 mg/dL (ref 0–99)
Total CHOL/HDL Ratio: 3.6 RATIO
Triglycerides: 233 mg/dL — ABNORMAL HIGH (ref <150)
VLDL: 47 mg/dL — ABNORMAL HIGH (ref 0–40)

## 2014-09-02 LAB — CSF CELL COUNT WITH DIFFERENTIAL
RBC Count, CSF: 1 /mm3 — ABNORMAL HIGH
RBC Count, CSF: 188 /mm3 — ABNORMAL HIGH
Tube #: 1
Tube #: 4
WBC CSF: 0 /mm3 (ref 0–5)
WBC CSF: 1 /mm3 (ref 0–5)

## 2014-09-02 LAB — BLOOD GAS, ARTERIAL
ACID-BASE DEFICIT: 0.9 mmol/L (ref 0.0–2.0)
Bicarbonate: 21.4 mEq/L (ref 20.0–24.0)
Drawn by: 103701
O2 CONTENT: 2 L/min
O2 Saturation: 91.8 %
PO2 ART: 66.4 mmHg — AB (ref 80.0–100.0)
Patient temperature: 100.6
TCO2: 19.4 mmol/L (ref 0–100)
pCO2 arterial: 30.3 mmHg — ABNORMAL LOW (ref 35.0–45.0)
pH, Arterial: 7.468 — ABNORMAL HIGH (ref 7.350–7.450)

## 2014-09-02 LAB — CBC WITH DIFFERENTIAL/PLATELET
BASOS ABS: 0 10*3/uL (ref 0.0–0.1)
Basophils Relative: 1 % (ref 0–1)
Eosinophils Absolute: 0.1 10*3/uL (ref 0.0–0.7)
Eosinophils Relative: 6 % — ABNORMAL HIGH (ref 0–5)
HCT: 28.2 % — ABNORMAL LOW (ref 39.0–52.0)
HEMOGLOBIN: 9 g/dL — AB (ref 13.0–17.0)
LYMPHS PCT: 9 % — AB (ref 12–46)
Lymphs Abs: 0.2 10*3/uL — ABNORMAL LOW (ref 0.7–4.0)
MCH: 28.1 pg (ref 26.0–34.0)
MCHC: 31.9 g/dL (ref 30.0–36.0)
MCV: 88.1 fL (ref 78.0–100.0)
Monocytes Absolute: 0.2 10*3/uL (ref 0.1–1.0)
Monocytes Relative: 11 % (ref 3–12)
NEUTROS ABS: 1.3 10*3/uL — AB (ref 1.7–7.7)
Neutrophils Relative %: 73 % (ref 43–77)
PLATELETS: 105 10*3/uL — AB (ref 150–400)
RBC: 3.2 MIL/uL — AB (ref 4.22–5.81)
RDW: 17.5 % — AB (ref 11.5–15.5)
WBC: 1.8 10*3/uL — AB (ref 4.0–10.5)

## 2014-09-02 LAB — CRYPTOCOCCAL ANTIGEN, CSF: Crypto Ag: NEGATIVE

## 2014-09-02 LAB — URINALYSIS, ROUTINE W REFLEX MICROSCOPIC
Bilirubin Urine: NEGATIVE
Glucose, UA: NEGATIVE mg/dL
Ketones, ur: NEGATIVE mg/dL
LEUKOCYTES UA: NEGATIVE
NITRITE: NEGATIVE
PROTEIN: 100 mg/dL — AB
Specific Gravity, Urine: 1.019 (ref 1.005–1.030)
Urobilinogen, UA: 1 mg/dL (ref 0.0–1.0)
pH: 6 (ref 5.0–8.0)

## 2014-09-02 LAB — GRAM STAIN: Gram Stain: NONE SEEN

## 2014-09-02 LAB — PROTIME-INR
INR: 1.2 (ref 0.00–1.49)
Prothrombin Time: 15.3 seconds — ABNORMAL HIGH (ref 11.6–15.2)

## 2014-09-02 LAB — TROPONIN I: Troponin I: 0.09 ng/mL — ABNORMAL HIGH (ref <0.031)

## 2014-09-02 LAB — MRSA PCR SCREENING: MRSA by PCR: NEGATIVE

## 2014-09-02 LAB — APTT: aPTT: 27 seconds (ref 24–37)

## 2014-09-02 LAB — LACTIC ACID, PLASMA
LACTIC ACID, VENOUS: 1.1 mmol/L (ref 0.5–2.0)
LACTIC ACID, VENOUS: 3.2 mmol/L — AB (ref 0.5–2.0)
Lactic Acid, Venous: 1.2 mmol/L (ref 0.5–2.0)

## 2014-09-02 LAB — PROCALCITONIN: Procalcitonin: 0.23 ng/mL

## 2014-09-02 MED ORDER — NITROGLYCERIN 0.4 MG SL SUBL
0.4000 mg | SUBLINGUAL_TABLET | SUBLINGUAL | Status: AC | PRN
  Administered 2014-09-03 (×3): 0.4 mg via SUBLINGUAL
  Filled 2014-09-02 (×2): qty 1

## 2014-09-02 MED ORDER — SODIUM CHLORIDE 0.9 % IJ SOLN: 10.0000 mL | INTRAMUSCULAR | Status: DC | PRN

## 2014-09-02 MED ORDER — SODIUM CHLORIDE 0.9 % IV BOLUS (SEPSIS)
500.0000 mL | Freq: Once | INTRAVENOUS | Status: AC
  Administered 2014-09-02: 500 mL via INTRAVENOUS

## 2014-09-02 MED ORDER — PIPERACILLIN-TAZOBACTAM 3.375 G IVPB 30 MIN
3.3750 g | Freq: Once | INTRAVENOUS | Status: AC
  Administered 2014-09-02: 3.375 g via INTRAVENOUS
  Filled 2014-09-02: qty 50

## 2014-09-02 MED ORDER — VANCOMYCIN HCL 10 G IV SOLR
1250.0000 mg | INTRAVENOUS | Status: AC
  Administered 2014-09-02: 1250 mg via INTRAVENOUS
  Filled 2014-09-02: qty 1250

## 2014-09-02 MED ORDER — FUROSEMIDE 10 MG/ML IJ SOLN
40.0000 mg | Freq: Once | INTRAMUSCULAR | Status: AC
  Administered 2014-09-02: 40 mg via INTRAVENOUS
  Filled 2014-09-02: qty 4

## 2014-09-02 MED ORDER — STROKE: EARLY STAGES OF RECOVERY BOOK
Freq: Once | Status: DC
  Filled 2014-09-02: qty 1

## 2014-09-02 MED ORDER — FENTANYL 50 MCG/HR TD PT72: 50.0000 ug | MEDICATED_PATCH | TRANSDERMAL | Status: DC

## 2014-09-02 MED ORDER — DEXTROSE 5 % IV SOLN
775.0000 mg | Freq: Three times a day (TID) | INTRAVENOUS | Status: DC
  Administered 2014-09-02 – 2014-09-03 (×3): 775 mg via INTRAVENOUS
  Filled 2014-09-02 (×4): qty 15.5

## 2014-09-02 MED ORDER — SODIUM CHLORIDE 0.9 % IV SOLN
INTRAVENOUS | Status: AC
  Administered 2014-09-02: 23:00:00 via INTRAVENOUS

## 2014-09-02 MED ORDER — IBUPROFEN 800 MG PO TABS
800.0000 mg | ORAL_TABLET | Freq: Four times a day (QID) | ORAL | Status: DC | PRN
  Administered 2014-09-04: 800 mg via ORAL
  Filled 2014-09-02: qty 1

## 2014-09-02 MED ORDER — HEPARIN SODIUM (PORCINE) 5000 UNIT/ML IJ SOLN
5000.0000 [IU] | Freq: Three times a day (TID) | INTRAMUSCULAR | Status: DC
  Administered 2014-09-03 – 2014-09-12 (×28): 5000 [IU] via SUBCUTANEOUS
  Filled 2014-09-02 (×28): qty 1

## 2014-09-02 MED ORDER — VANCOMYCIN HCL IN DEXTROSE 1-5 GM/200ML-% IV SOLN: 1000.0000 mg | Freq: Once | INTRAVENOUS | Status: DC

## 2014-09-02 MED ORDER — ATORVASTATIN CALCIUM 40 MG PO TABS
80.0000 mg | ORAL_TABLET | Freq: Every day | ORAL | Status: DC
Start: 2014-09-02 — End: 2014-09-13
  Administered 2014-09-02 – 2014-09-12 (×9): 80 mg via ORAL
  Filled 2014-09-02 (×5): qty 2
  Filled 2014-09-02: qty 1
  Filled 2014-09-02 (×6): qty 2

## 2014-09-02 MED ORDER — VANCOMYCIN HCL 10 G IV SOLR
1250.0000 mg | Freq: Two times a day (BID) | INTRAVENOUS | Status: DC
  Administered 2014-09-02 – 2014-09-03 (×2): 1250 mg via INTRAVENOUS
  Filled 2014-09-02 (×3): qty 1250

## 2014-09-02 MED ORDER — PIPERACILLIN-TAZOBACTAM 3.375 G IVPB
3.3750 g | Freq: Three times a day (TID) | INTRAVENOUS | Status: DC
  Filled 2014-09-02: qty 50

## 2014-09-02 MED ORDER — GADOBENATE DIMEGLUMINE 529 MG/ML IV SOLN
20.0000 mL | Freq: Once | INTRAVENOUS | Status: AC | PRN
  Administered 2014-09-02: 20 mL via INTRAVENOUS

## 2014-09-02 MED ORDER — SODIUM CHLORIDE 0.9 % IV BOLUS (SEPSIS)
1000.0000 mL | Freq: Once | INTRAVENOUS | Status: AC
  Administered 2014-09-02: 1000 mL via INTRAVENOUS

## 2014-09-02 MED ORDER — FENTANYL 25 MCG/HR TD PT72: 25.0000 ug | MEDICATED_PATCH | TRANSDERMAL | Status: DC

## 2014-09-02 MED ORDER — DEXTROSE 5 % IV SOLN
2.0000 g | Freq: Three times a day (TID) | INTRAVENOUS | Status: DC
  Administered 2014-09-02 – 2014-09-03 (×4): 2 g via INTRAVENOUS
  Filled 2014-09-02 (×5): qty 2

## 2014-09-02 MED ORDER — IOHEXOL 350 MG/ML SOLN
100.0000 mL | Freq: Once | INTRAVENOUS | Status: AC | PRN
  Administered 2014-09-02: 100 mL via INTRAVENOUS

## 2014-09-02 MED ORDER — CETYLPYRIDINIUM CHLORIDE 0.05 % MT LIQD
7.0000 mL | Freq: Two times a day (BID) | OROMUCOSAL | Status: DC
  Administered 2014-09-03 – 2014-09-11 (×15): 7 mL via OROMUCOSAL

## 2014-09-02 NOTE — Procedures (Signed)
LP Procedure Note:  Patient has been seen and examined.  Chart has been reviewed.  LP is being performed to rule out meningitis.  Procedure has been explained to patient/family including risks and benefits.  Consent has been signed by patient/family and witnessed.   Blood pressure 128/69, pulse 101, temperature 102.7 F (39.3 C), temperature source Core (Comment), resp. rate 22, height 6' (1.829 m), weight 99.791 kg (220 lb), SpO2 100 %.   Current facility-administered medications:  .   stroke: mapping our early stages of recovery book, , Does not apply, Once, Janece Canterbury, MD .  0.9 %  sodium chloride infusion, 250 mL, Intravenous, PRN, Waldemar Dickens, MD .  [DISCONTINUED] acetaminophen (TYLENOL) tablet 650 mg, 650 mg, Oral, Q6H PRN **OR** acetaminophen (TYLENOL) suppository 650 mg, 650 mg, Rectal, Q6H PRN, Waldemar Dickens, MD .  acetaminophen (TYLENOL) tablet 650 mg, 650 mg, Oral, Q4H PRN, Waldemar Dickens, MD, 650 mg at 09/02/14 1643 .  allopurinol (ZYLOPRIM) tablet 300 mg, 300 mg, Oral, Daily, Waldemar Dickens, MD, 300 mg at 09/02/14 1641 .  aspirin chewable tablet 81 mg, 81 mg, Oral, Daily, Waldemar Dickens, MD, 81 mg at 09/02/14 1641 .  atorvastatin (LIPITOR) tablet 80 mg, 80 mg, Oral, q1800, Janece Canterbury, MD .  carvedilol (COREG) tablet 6.25 mg, 6.25 mg, Oral, BID WC, Janece Canterbury, MD, 6.25 mg at 09/02/14 1642 .  cefTAZidime (FORTAZ) 2 g in dextrose 5 % 50 mL IVPB, 2 g, Intravenous, 3 times per day, Janece Canterbury, MD, 2 g at 09/02/14 1334 .  gi cocktail (Maalox,Lidocaine,Donnatal), 30 mL, Oral, QID PRN, Waldemar Dickens, MD .  Derrill Memo ON 09/03/2014] heparin injection 5,000 Units, 5,000 Units, Subcutaneous, 3 times per day, Janece Canterbury, MD .  irbesartan (AVAPRO) tablet 150 mg, 150 mg, Oral, Daily, Janece Canterbury, MD, 150 mg at 09/02/14 1737 .  nitroGLYCERIN (NITROSTAT) SL tablet 0.4 mg, 0.4 mg, Sublingual, Q5 min PRN, Janece Canterbury, MD .  ondansetron [facility]) injection 4 mg,  4 mg, Intravenous, Q6H PRN, Waldemar Dickens, MD, 4 mg at 09/02/14 1245 .  oxyCODONE (Oxy IR/ROXICODONE) immediate release tablet 5-10 mg, 5-10 mg, Oral, Q4H PRN, Waldemar Dickens, MD .  pantoprazole (PROTONIX) EC tablet 80 mg, 80 mg, Oral, Q1200, Waldemar Dickens, MD, 80 mg at 09/02/14 1643 .  potassium chloride (K-DUR) CR tablet 10 mEq, 10 mEq, Oral, Daily, Waldemar Dickens, MD, 10 mEq at 09/02/14 1643 .  pregabalin (LYRICA) capsule 75 mg, 75 mg, Oral, BID, Waldemar Dickens, MD, 75 mg at 09/02/14 0850 .  sodium chloride 0.9 % injection 10-40 mL, 10-40 mL, Intracatheter, PRN, Janece Canterbury, MD .  sodium chloride 0.9 % injection 3 mL, 3 mL, Intravenous, Q12H, Waldemar Dickens, MD, 3 mL at 09/02/14 0800 .  sodium chloride 0.9 % injection 3 mL, 3 mL, Intravenous, PRN, Waldemar Dickens, MD .  vancomycin (VANCOCIN) 1,250 mg in sodium chloride 0.9 % 250 mL IVPB, 1,250 mg, Intravenous, Q12H, Janece Canterbury, MD  Facility-Administered Medications Ordered in Other Encounters:  .  sodium chloride 0.9 % injection 10 mL, 10 mL, Intravenous, PRN, Ladell Pier, MD, 10 mL at 11/14/13 1510   Recent Labs  09/01/14 1359 09/02/14 0810  WBC 2.4* 1.8*  HGB 9.3* 9.0*  HCT 28.7* 28.2*  PLT 109* 105*  INR  --  1.20    CT of head: IMPRESSION: 1. No acute intracranial abnormalities.  Patient was placed in the lateral decub position.  Area  was cleaned with betadine and anesthetized with lidocaine.  Under sterile conditions 20G LP needle was placed at approximately L3-4 without difficulty.  Opening pressure was documented at 28.  Approximately 36cc of clear fluid was obtained and sent for studies dictated by ID.  Closing pressure was documented at 13.  No complications were noted.    Alexis Goodell, MD [facility] [phone] 09/02/2014  5:38 PM

## 2014-09-02 NOTE — Consult Note (Signed)
[facility]    Date of Admission:  09/01/2014  Date of Consult:  09/02/2014  Reason for Consult: FUO in patient with pancytopenia, multiple myeloma sp chemotherapy start last Friday   Referring Physician: Dr. Sheran Fava   HPI:  Walter Dennis is an 64 y.o. male wih hx of Multiple myeloma diagnosedin 2008 with course of treatment outlined in detail in Dr. Gearldine Shown note. 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 He had been off chemotherapy in 2 months prior to his enrollment into clinical trial involving [facility] 08/28/2013 with bendamustine/pomalidomide. Prior to enrollment into the trial he was ALREADY suffering from fevers, at times rigors, shaking for several weeks. During these "shaking episodes" that would typically happen at night the patient would become non-communicative. Per patient and his wife, his enrollment into the [facility] clinical trial was delayed until he was no longer having fevers and not on any antibiotics, having been rx with azithromycin for CAP at his PCP [facility]).  He had first dose of chemotherapy on Friday.  In the interim he developed chest pain with radiation to left arm with associated chills, and SOB, worsening DOE. He had CT chest yesterday. Overnight he became febrile and Tmax in last 24 hours was 104.5 degrees. He was initially started on vancomycin and zosyn then vancomycin and ceftazidime after blood cultures were drawn. He has had interim worsening pancytopenia including neutropenia.  He has had CTA and found to have evidence of CAD by CT scan. He has new LBBB and + troponins and being seen by Cardiology who do not find him suitable for cardiac cath in present condition. He does have new pleural effusion on CTA today.  Today he became profoundly more confused than he had been in weeks before. He was found to be hypoxemic on ABG. He had apparently had 2 fentanyl patches on  as well and these were removed. He has had intervals of complete lucidity followed by confusion again though confusion appears to occur mainly with his fevers. His ssx also pertinent for HA that has been severe at times. He has had no sick contacts. He has a pet Dachshund but no other animals at home. 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 His lifetime travel is isolated to the [place] besides one trip to [place] He has no hx of exposure to TB.      Past Medical History  Diagnosis Date  . Hypertension   . Cancer     Myeloma    Past Surgical History  Procedure Laterality Date  . Portacath placement      right chest  . Cardiac catheterization      2012 - nml in [place]  ergies:   No Known Allergies   Medications: I have reviewed patients current medications as documented in Epic Anti-infectives    Start     Dose/Rate Route Frequency Ordered Stop   09/02/14 2000  vancomycin (VANCOCIN) 1,250 mg in sodium chloride 0.9 % 250 mL IVPB     1,250 mg 166.7 mL/hr over 90 Minutes Intravenous Every 12 hours 09/02/14 0850     09/02/14 1400  piperacillin-tazobactam (ZOSYN) IVPB 3.375 g  Status:  Discontinued     3.375 g 12.5 mL/hr over 240 Minutes Intravenous 3 times per day 09/02/14 0749 09/02/14 1209   09/02/14 1400  cefTAZidime (FORTAZ) 2  g in dextrose 5 % 50 mL IVPB     2 g 100 mL/hr over 30 Minutes Intravenous 3 times per day 09/02/14 1210     09/02/14 0800  vancomycin (VANCOCIN) 1,250 mg in sodium chloride 0.9 % 250 mL IVPB     1,250 mg 166.7 mL/hr over 90 Minutes Intravenous STAT 09/02/14 0752 09/02/14 0930   09/02/14 0745  piperacillin-tazobactam (ZOSYN) IVPB 3.375 g     3.375 g 100 mL/hr over 30 Minutes Intravenous  Once 09/02/14 0737 09/02/14 0815   09/02/14 0745  vancomycin (VANCOCIN) IVPB 1000 mg/200 mL premix  Status:  Discontinued     1,000 mg 200 mL/hr over 60 Minutes Intravenous  Once 09/02/14 0737 09/02/14 0746        Social History:  reports that he quit smoking about 11 years ago. He has never used smokeless tobacco. He reports that he does not drink alcohol or use illicit drugs.  Family History  Problem Relation Age of Onset  . Hypertension Mother   . Hypertension Father   . Hypertension Sister   . Hypertension Brother   . Heart disease Father     As in HPI and primary teams notes otherwise 12 point review of systems is negative  Blood pressure 131/53, pulse 108, temperature 104.5 F (40.3 C), temperature source Core (Comment), resp. rate 19, height 6' (1.829 m), weight 220 lb (99.791 kg), SpO2 100 %. General: Alert and awake, oriented x3, not in any acute distress but slightly flushed HEENT: anicteric sclera, pupils reactive to light and accommodation, EOMI, oropharynx clear and without exudate CVS tachy rate, normal r,  no murmur rubs or gallops Chest: clear to auscultation bilaterally, no wheezing, rales or rhonchi Abdomen: soft nontender, nondistended, normal bowel sounds, Extremities: no  clubbing or edema noted bilaterally Skin: no rashes other than flushed face, porta cath is clean Neuro: nonfocal, strength and sensation intact   Results for orders placed or performed during the hospital encounter of 09/01/14 (from the past 48 hour(s))  CBC     Status: Abnormal   Collection Time: 09/01/14  1:59 PM  Result Value Ref Range   WBC 2.4 (L) 4.0 - 10.5 K/uL   RBC 3.21 (L) 4.22 - 5.81 MIL/uL   Hemoglobin 9.3 (L) 13.0 - 17.0 g/dL   HCT 28.7 (L) 39.0 - 52.0 %   MCV 89.4 78.0 - 100.0 fL   MCH 29.0 26.0 - 34.0 pg   MCHC 32.4 30.0 - 36.0 g/dL   RDW 17.2 (H) 11.5 - 15.5 %   Platelets 109 (L) 150 - 400 K/uL    Comment: SPECIMEN CHECKED FOR CLOTS PLATELETS APPEAR DECREASED PLATELET COUNT CONFIRMED BY SMEAR   Basic metabolic panel     Status: Abnormal   Collection Time: 09/01/14  1:59 PM  Result Value Ref Range   Sodium 137 135 - 145 mmol/L   Potassium 4.4 3.5 - 5.1 mmol/L   Chloride  104 101 - 111 mmol/L   CO2 24 22 - 32 mmol/L   Glucose, Bld 122 (H) 65 - 99 mg/dL   BUN 17 6 - 20 mg/dL   Creatinine, Ser 1.26 (H) 0.61 - 1.24 mg/dL   Calcium 7.6 (L) 8.9 - 10.3 mg/dL   GFR calc non Af Amer 59 (L) >60 mL/min   GFR calc Af Amer >60 >60 mL/min    Comment: (NOTE) The eGFR has been calculated using the CKD EPI equation. This calculation has not been validated in all clinical situations. eGFR's  persistently <60 mL/min signify possible Chronic Kidney Disease.    Anion gap 9 5 - 15  BNP (order ONLY if patient complains of dyspnea/SOB AND you have documented it for THIS visit)     Status: Abnormal   Collection Time: 09/01/14  1:59 PM  Result Value Ref Range   B Natriuretic Peptide 452.2 (H) 0.0 - 100.0 pg/mL  I-stat troponin, ED  (not at [facility])     Status: None   Collection Time: 09/01/14  2:08 PM  Result Value Ref Range   Troponin i, poc 0.02 0.00 - 0.08 ng/mL   Comment 3            Comment: Due to the release kinetics of cTnI, a negative result within the first hours of the onset of symptoms does not rule out myocardial infarction with certainty. If myocardial infarction is still suspected, repeat the test at appropriate intervals.   Troponin I-serum (0, 3, 6 hours)     Status: Abnormal   Collection Time: 09/01/14  8:05 PM  Result Value Ref Range   Troponin I 0.08 (H) <0.031 ng/mL    Comment:        PERSISTENTLY INCREASED TROPONIN VALUES IN THE RANGE OF 0.04-0.49 ng/mL CAN BE SEEN IN:       -UNSTABLE ANGINA       -CONGESTIVE HEART FAILURE       -MYOCARDITIS       -CHEST TRAUMA       -ARRYHTHMIAS       -LATE PRESENTING MYOCARDIAL INFARCTION       -COPD   CLINICAL FOLLOW-UP RECOMMENDED.   Troponin I-serum (0, 3, 6 hours)     Status: Abnormal   Collection Time: 09/01/14 11:00 PM  Result Value Ref Range   Troponin I 0.09 (H) <0.031 ng/mL    Comment:        PERSISTENTLY INCREASED TROPONIN VALUES IN THE RANGE OF 0.04-0.49 ng/mL CAN BE SEEN IN:        -UNSTABLE ANGINA       -CONGESTIVE HEART FAILURE       -MYOCARDITIS       -CHEST TRAUMA       -ARRYHTHMIAS       -LATE PRESENTING MYOCARDIAL INFARCTION       -COPD   CLINICAL FOLLOW-UP RECOMMENDED.   Troponin I-serum (0, 3, 6 hours)     Status: Abnormal   Collection Time: 09/02/14  1:34 AM  Result Value Ref Range   Troponin I 0.09 (H) <0.031 ng/mL    Comment:        PERSISTENTLY INCREASED TROPONIN VALUES IN THE RANGE OF 0.04-0.49 ng/mL CAN BE SEEN IN:       -UNSTABLE ANGINA       -CONGESTIVE HEART FAILURE       -MYOCARDITIS       -CHEST TRAUMA       -ARRYHTHMIAS       -LATE PRESENTING MYOCARDIAL INFARCTION       -COPD   CLINICAL FOLLOW-UP RECOMMENDED.   Lipid panel     Status: Abnormal   Collection Time: 09/02/14  8:10 AM  Result Value Ref Range   Cholesterol 139 0 - 200 mg/dL   Triglycerides 233 (H) <150 mg/dL   HDL 39 (L) >40 mg/dL   Total CHOL/HDL Ratio 3.6 RATIO   VLDL 47 (H) 0 - 40 mg/dL   LDL Cholesterol 53 0 - 99 mg/dL    Comment:  Total Cholesterol/HDL:CHD Risk Coronary Heart Disease Risk Table                     Men   Women  1/2 Average Risk   3.4   3.3  Average Risk       5.0   4.4  2 X Average Risk   9.6   7.1  3 X Average Risk  23.4   11.0        Use the calculated Patient Ratio above and the CHD Risk Table to determine the patient's CHD Risk.        ATP III CLASSIFICATION (LDL):  <100     mg/dL   Optimal  100-129  mg/dL   Near or Above                    Optimal  130-159  mg/dL   Borderline  160-189  mg/dL   High  >190     mg/dL   Very High Performed at [facility]   Comprehensive metabolic panel     Status: Abnormal   Collection Time: 09/02/14  8:10 AM  Result Value Ref Range   Sodium 133 (L) 135 - 145 mmol/L   Potassium 3.6 3.5 - 5.1 mmol/L    Comment: DELTA CHECK NOTED REPEATED TO VERIFY    Chloride 98 (L) 101 - 111 mmol/L   CO2 24 22 - 32 mmol/L   Glucose, Bld 120 (H) 65 - 99 mg/dL   BUN 18 6 - 20 mg/dL   Creatinine,  Ser 1.13 0.61 - 1.24 mg/dL   Calcium 7.7 (L) 8.9 - 10.3 mg/dL   Total Protein 9.1 (H) 6.5 - 8.1 g/dL   Albumin 2.8 (L) 3.5 - 5.0 g/dL   AST 17 15 - 41 U/L   ALT 39 17 - 63 U/L   Alkaline Phosphatase 78 38 - 126 U/L   Total Bilirubin 0.8 0.3 - 1.2 mg/dL   GFR calc non Af Amer >60 >60 mL/min   GFR calc Af Amer >60 >60 mL/min    Comment: (NOTE) The eGFR has been calculated using the CKD EPI equation. This calculation has not been validated in all clinical situations. eGFR's persistently <60 mL/min signify possible Chronic Kidney Disease.    Anion gap 11 5 - 15  CBC with Differential/Platelet     Status: Abnormal   Collection Time: 09/02/14  8:10 AM  Result Value Ref Range   WBC 1.8 (L) 4.0 - 10.5 K/uL   RBC 3.20 (L) 4.22 - 5.81 MIL/uL   Hemoglobin 9.0 (L) 13.0 - 17.0 g/dL   HCT 28.2 (L) 39.0 - 52.0 %   MCV 88.1 78.0 - 100.0 fL   MCH 28.1 26.0 - 34.0 pg   MCHC 31.9 30.0 - 36.0 g/dL   RDW 17.5 (H) 11.5 - 15.5 %   Platelets 105 (L) 150 - 400 K/uL    Comment: CONSISTENT WITH PREVIOUS RESULT   Neutrophils Relative % 73 43 - 77 %   Neutro Abs 1.3 (L) 1.7 - 7.7 K/uL   Lymphocytes Relative 9 (L) 12 - 46 %   Lymphs Abs 0.2 (L) 0.7 - 4.0 K/uL   Monocytes Relative 11 3 - 12 %   Monocytes Absolute 0.2 0.1 - 1.0 K/uL   Eosinophils Relative 6 (H) 0 - 5 %   Eosinophils Absolute 0.1 0.0 - 0.7 K/uL   Basophils Relative 1 0 - 1 %   Basophils Absolute  0.0 0.0 - 0.1 K/uL  Lactic acid, plasma     Status: None   Collection Time: 09/02/14  8:10 AM  Result Value Ref Range   Lactic Acid, Venous 1.1 0.5 - 2.0 mmol/L  Procalcitonin     Status: None   Collection Time: 09/02/14  8:10 AM  Result Value Ref Range   Procalcitonin 0.23 ng/mL    Comment:        Interpretation: PCT (Procalcitonin) <= 0.5 ng/mL: Systemic infection (sepsis) is not likely. Local bacterial infection is possible. (NOTE)         ICU PCT Algorithm               Non ICU PCT Algorithm    ----------------------------      ------------------------------         PCT < 0.25 ng/mL                 PCT < 0.1 ng/mL     Stopping of antibiotics            Stopping of antibiotics       strongly encouraged.               strongly encouraged.    ----------------------------     ------------------------------       PCT level decrease by               PCT < 0.25 ng/mL       >= 80% from peak PCT       OR PCT 0.25 - 0.5 ng/mL          Stopping of antibiotics                                             encouraged.     Stopping of antibiotics           encouraged.    ----------------------------     ------------------------------       PCT level decrease by              PCT >= 0.25 ng/mL       < 80% from peak PCT        AND PCT >= 0.5 ng/mL            Continuin g antibiotics                                              encouraged.       Continuing antibiotics            encouraged.    ----------------------------     ------------------------------     PCT level increase compared          PCT > 0.5 ng/mL         with peak PCT AND          PCT >= 0.5 ng/mL             Escalation of antibiotics                                          strongly encouraged.  Escalation of antibiotics        strongly encouraged.   Protime-INR     Status: Abnormal   Collection Time: 09/02/14  8:10 AM  Result Value Ref Range   Prothrombin Time 15.3 (H) 11.6 - 15.2 seconds   INR 1.20 0.00 - 1.49  APTT     Status: None   Collection Time: 09/02/14  8:10 AM  Result Value Ref Range   aPTT 27 24 - 37 seconds  Urinalysis, Routine w reflex microscopic (not at [facility])     Status: Abnormal   Collection Time: 09/02/14  8:51 AM  Result Value Ref Range   Color, Urine YELLOW YELLOW   APPearance CLEAR CLEAR   Specific Gravity, Urine 1.019 1.005 - 1.030   pH 6.0 5.0 - 8.0   Glucose, UA NEGATIVE NEGATIVE mg/dL   Hgb urine dipstick SMALL (A) NEGATIVE   Bilirubin Urine NEGATIVE NEGATIVE   Ketones, ur NEGATIVE NEGATIVE mg/dL   Protein, ur 100 (A)  NEGATIVE mg/dL   Urobilinogen, UA 1.0 0.0 - 1.0 mg/dL   Nitrite NEGATIVE NEGATIVE   Leukocytes, UA NEGATIVE NEGATIVE  Urine microscopic-add on     Status: None   Collection Time: 09/02/14  8:51 AM  Result Value Ref Range   Squamous Epithelial / LPF RARE RARE   WBC, UA 0-2 <3 WBC/hpf   RBC / HPF 0-2 <3 RBC/hpf   Bacteria, UA RARE RARE  Lactic acid, plasma     Status: None   Collection Time: 09/02/14 10:25 AM  Result Value Ref Range   Lactic Acid, Venous 1.2 0.5 - 2.0 mmol/L  Blood gas, arterial     Status: Abnormal   Collection Time: 09/02/14 12:18 PM  Result Value Ref Range   O2 Content 2.0 L/min   Delivery systems NASAL CANNULA    pH, Arterial 7.468 (H) 7.350 - 7.450   pCO2 arterial 30.3 (L) 35.0 - 45.0 mmHg   pO2, Arterial 66.4 (L) 80.0 - 100.0 mmHg   Bicarbonate 21.4 20.0 - 24.0 mEq/L   TCO2 19.4 0 - 100 mmol/L   Acid-base deficit 0.9 0.0 - 2.0 mmol/L   O2 Saturation 91.8 %   Patient temperature 100.6    Collection site BRACHIAL ARTERY    Drawn by [id number]    Sample type ARTERIAL   MRSA PCR Screening     Status: None   Collection Time: 09/02/14  3:35 PM  Result Value Ref Range   MRSA by PCR NEGATIVE NEGATIVE    Comment:        The GeneXpert MRSA Assay (FDA approved for NASAL specimens only), is one component of a comprehensive MRSA colonization surveillance program. It is not intended to diagnose MRSA infection nor to guide or monitor treatment for MRSA infections.    @BRIEFLABTABLE (sdes,specrequest,cult,reptstatus)   ) Recent Results (from the past 720 hour(s))  Culture, blood (routine x 2)     Status: None   Collection Time: 08/06/14  1:00 AM  Result Value Ref Range Status   Specimen Description BLOOD LAC  Final   Special Requests BOTTLES DRAWN AEROBIC AND ANAEROBIC 5ML  Final   Culture   Final    NO GROWTH 5 DAYS Performed at [facility]    Report Status 08/12/2014 FINAL  Final  Culture, blood (routine x 2)     Status: None   Collection  Time: 08/06/14  1:15 AM  Result Value Ref Range Status   Specimen Description BLOOD RAC  Final   Special Requests  BOTTLES DRAWN AEROBIC AND ANAEROBIC 5CC  Final   Culture   Final    NO GROWTH 5 DAYS Performed at [facility]    Report Status 08/12/2014 FINAL  Final  MRSA PCR Screening     Status: None   Collection Time: 09/02/14  3:35 PM  Result Value Ref Range Status   MRSA by PCR NEGATIVE NEGATIVE Final    Comment:        The GeneXpert MRSA Assay (FDA approved for NASAL specimens only), is one component of a comprehensive MRSA colonization surveillance program. It is not intended to diagnose MRSA infection nor to guide or monitor treatment for MRSA infections.      Impression/Recommendation  Principal Problem:   Chest pain Active Problems:   Multiple myeloma   Essential hypertension   CKD (chronic kidney disease) stage 3, GFR 30-59 ml/min   GERD (gastroesophageal reflux disease)   Abdominal pain, chronic, epigastric   Cancer   FUO (fever of unknown origin)   Lethargy   Walter Dennis is a 64 y.o. 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) with several week hx of fever, rigors, chills and confusion sp initation of chemotherapy on Friday at [facility] now with admission with chest pain, LBBB, + troponins, worsening pancytopenia and high fevers to above 104 with episodes of worsening confusion  #1 FUO in pancytopenic patient with Multiple Myeloma, HA, confusion, ACS:  I agree with large volume LP that Dr Doy Mince had performed this evening  We need this to look not just for bacterial meningo-encephalitis (less likely) but also for HSV encephalitis and other more indolent opportunistic infections such as fungal or NTM, Nocardia  I have put in orders for at least 8-10 cc to be used for AFB stain and culture and same for fungal stain and culture  He has CSF GS and culture CSF cell count and diff , glucose and protein ordered  HSV PCR  also ordered and I added CSF crypto antigen  I have also ordered an MRI of the brain with contrast to look for possible Temporal lobe enhancement one would see with HSV 1 encephalitis and to look for brain abscess, invasive aspergillomas etc  For now I would keep him on his neutropenic fever meds of ceftazidime and vancomycin  I would NOT yet pull trigger on an antifungal  I will consider adding acyclovir tonight while we await MRI brain and LP results  I agree that his fevers certainly could be due to Multiple Myeloma as well. Per his wife he had similar episodes of confusion when first diagnosed with MM  If he has not had imaging or labs to pin point cause of his fevers yet he will need CT abdomen with contrast tomorrow.  #2 Confusion: seems to happen WHEN he has fevers so I do NOT necessarily think that this is clear cut evidence of an encephalopathy given waxing waning nature of it and correlation with fevers. He has been having similar though less severe episodes over past several weeks--and hence need to look for more indolent causes of his ssx such as chronic fungal meningitis  #3 Screening: will check for HIV and HCV  I spent greater than 60 minutes with the patient including greater than 50% of time in face to face counsel of the patient and his wife re FUO workup and in coordination of their care.    09/02/2014, 6:20 PM   Thank you so much for this interesting consult  [facility] [phone] (pager) [phone] (office) 09/02/2014, 6:20 PM  Rhina [place] 09/02/2014, 6:20 PM

## 2014-09-02 NOTE — Progress Notes (Signed)
Called into room by wife who stated patient is not acting his normal self after "shaking" episode. Pt lethargic and only opens eyes to repeated stimulation on feet. Slight right arm drift, pupils equal and reactive, decreased reflexes in BLE, moderate strength in bilateral hands. ABG completed and Dr. Sheran Fava adding more orders. Will continue to monitor and will request stepdown bed.

## 2014-09-02 NOTE — Progress Notes (Addendum)
ANTIBIOTIC CONSULT NOTE - INITIAL  Pharmacy Consult for Vancomycin, Zosyn --> Ceftazidime Indication: Febrile Neutropenia, Sepsis  No Known Allergies  Patient Measurements: Height: 6' (182.9 cm) Weight: 220 lb (99.791 kg) IBW/kg (Calculated) : 77.6  Vital Signs: Temp: 98.3 F (36.8 C) (06/21 0844) Temp Source: Oral (06/21 0844) BP: 126/57 mmHg (06/21 0844) Pulse Rate: 85 (06/21 0844) Intake/Output from previous day: 06/20 0701 - 06/21 0700 In: 480 [P.O.:480] Out: 700 [Urine:700] Intake/Output from this shift:    Labs:  Recent Labs  09/01/14 1359 09/02/14 0810  WBC 2.4* 1.8*  HGB 9.3* 9.0*  PLT 109* 105*  CREATININE 1.26*  --    Estimated Creatinine Clearance: 74.4 mL/min (by C-G formula based on Cr of 1.26). No results for input(s): VANCOTROUGH, VANCOPEAK, VANCORANDOM, GENTTROUGH, GENTPEAK, GENTRANDOM, TOBRATROUGH, TOBRAPEAK, TOBRARND, AMIKACINPEAK, AMIKACINTROU, AMIKACIN in the last 72 hours.   Microbiology: Recent Results (from the past 720 hour(s))  Culture, blood (routine x 2)     Status: None   Collection Time: 08/06/14  1:00 AM  Result Value Ref Range Status   Specimen Description BLOOD LAC  Final   Special Requests BOTTLES DRAWN AEROBIC AND ANAEROBIC 5ML  Final   Culture   Final    NO GROWTH 5 DAYS Performed at [facility]    Report Status 08/12/2014 FINAL  Final  Culture, blood (routine x 2)     Status: None   Collection Time: 08/06/14  1:15 AM  Result Value Ref Range Status   Specimen Description BLOOD RAC  Final   Special Requests BOTTLES DRAWN AEROBIC AND ANAEROBIC 5CC  Final   Culture   Final    NO GROWTH 5 DAYS Performed at [facility]    Report Status 08/12/2014 FINAL  Final    Medical History: Past Medical History  Diagnosis Date  . Hypertension   . Cancer     Myeloma    Assessment: 64 y/o M with PMH of HTN, multiple myeloma, recent PNA who presented to [facility] ED on 6/20 with CP, chills, SOB. CXR with no active  disease. Patient with WBC of 1.8 and Tmax of 100.5 this AM. Pharmacy consulted to dose Vancomycin and Zosyn for sepsis, febrile neutropenia.  6/21 >> Vancomycin >> 6/21 >> Zosyn >>    6/21 blood x 2: sent 6/21 UA: in process  6/21 LA: in process 6/21 PCT: in process  Goal of Therapy:  Vancomycin trough level 15-20 mcg/ml  Appropriate antibiotic dosing for renal function and indication Eradication of infection  Plan:   Vancomycin 1250mg IV q12h.  Plan for Vancomycin trough level at steady state.  Zosyn 3.375g IV x 1 over 30 min STAT, then Zosyn 3.375g IV q8h (infuse over 4 hours).  Monitor renal function, cultures, clinical course.   Jigna Gadhia, PharmD, BCPS Pager: [phone] 09/02/2014 9:01 AM    Addendum:  Zosyn changed to Ceftazidime per TRH 2/2 concern for worsening cytopenias. Pharmacy consulted to assist with dosing.   Ceftazidime 2g IV q8h.   Jigna Gadhia, PharmD, BCPS Pager: [phone] 09/02/2014 12:12 PM   

## 2014-09-02 NOTE — Progress Notes (Signed)
ANTIBIOTIC CONSULT NOTE - INITIAL  Pharmacy Consult for Acyclovir Indication: r/o meningitis  No Known Allergies  Patient Measurements: Height: 6' (182.9 cm) Weight: 225 lb 15.5 oz (102.5 kg) IBW/kg (Calculated) : 77.6  Vital Signs: Temp: 104.5 F (40.3 C) (06/21 1800) Temp Source: Core (Comment) (06/21 1800) BP: 119/48 mmHg (06/21 1800) Pulse Rate: 108 (06/21 1731) Intake/Output from previous day: 06/20 0701 - 06/21 0700 In: 480 [P.O.:480] Out: 700 [Urine:700] Intake/Output from this shift: Total I/O In: 90 [I.V.:40; IV Piggyback:50] Out: 3802 [Urine:3801; Stool:1]  Labs:  Recent Labs  09/01/14 1359 09/02/14 0810  WBC 2.4* 1.8*  HGB 9.3* 9.0*  PLT 109* 105*  CREATININE 1.26* 1.13   Estimated Creatinine Clearance: 84 mL/min (by C-G formula based on Cr of 1.13). No results for input(s): VANCOTROUGH, VANCOPEAK, VANCORANDOM, GENTTROUGH, GENTPEAK, GENTRANDOM, TOBRATROUGH, TOBRAPEAK, TOBRARND, AMIKACINPEAK, AMIKACINTROU, AMIKACIN in the last 72 hours.   Microbiology: Recent Results (from the past 720 hour(s))  Culture, blood (routine x 2)     Status: None   Collection Time: 08/06/14  1:00 AM  Result Value Ref Range Status   Specimen Description BLOOD LAC  Final   Special Requests BOTTLES DRAWN AEROBIC AND ANAEROBIC 5ML  Final   Culture   Final    NO GROWTH 5 DAYS Performed at [facility]    Report Status 08/12/2014 FINAL  Final  Culture, blood (routine x 2)     Status: None   Collection Time: 08/06/14  1:15 AM  Result Value Ref Range Status   Specimen Description BLOOD RAC  Final   Special Requests BOTTLES DRAWN AEROBIC AND ANAEROBIC 5CC  Final   Culture   Final    NO GROWTH 5 DAYS Performed at [facility]    Report Status 08/12/2014 FINAL  Final  MRSA PCR Screening     Status: None   Collection Time: 09/02/14  3:35 PM  Result Value Ref Range Status   MRSA by PCR NEGATIVE NEGATIVE Final    Comment:        The GeneXpert MRSA Assay  (FDA approved for NASAL specimens only), is one component of a comprehensive MRSA colonization surveillance program. It is not intended to diagnose MRSA infection nor to guide or monitor treatment for MRSA infections.     Medical History: Past Medical History  Diagnosis Date  . Hypertension   . Cancer     Myeloma    Assessment: 61 yoM admitted 6/20 with chills, SOB, and chest pain.  PMH includes multiple myeloma currently receiving chemotherapy, aspergillosis (2013), HTN, and recent pneumonia.  Admission labs show neutropenia.  CT head was w/o acute abnormality.  Pharmacy is consulted to dose vancomycin and ceftazidime for sepsis, febrile neutropenia.  He was noted to have increasing confusion, headache, and fever today with concern for bacterial, HSV, or opportunistic encephalitis.  LP has been performed, MRI is pending.  Pharmacy is now consulted to additionally dose Acyclovir.  6/21 >> Vancomycin >> 6/21 >> Zosyn >> 6/21 6/21 >> Ceftazidime >> 6/21 >> Acyclovir >>  Today, 09/02/2014:  Tmax: 104.5  WBC: 1.8, ANC 1.3  Renal: SCr 1.13, CrCl ~ 84 ml/min CG (~ 69 ml/min Normalized)  Weight 102.5 kg, 132% of ideal weight 77 kg.  Goal of Therapy:  Appropriate abx dosing, eradication of infection.   Plan:   Acyclovir 775 mg (10 mg/kg, ideal weight) IV q8h.  Follow up renal fxn, culture results, and clinical course.   Gretta Arab PharmD, BCPS Pager [phone] 09/02/2014  6:56 PM

## 2014-09-02 NOTE — Progress Notes (Signed)
CRITICAL VALUE ALERT  Critical value received:  Lactic acid 3.2  Date of notification:  09/02/14  Time of notification:  2230  Critical value read back:Yes.    Nurse who received alert:  Leola Brazil  MD notified (1st page):  Forrest Moron, [facility] NP  Time of first page:  1047  MD notified (2nd page):  Time of second page:  Responding MD:  Forrest Moron  Time MD responded: [phone]

## 2014-09-02 NOTE — Progress Notes (Signed)
Hand off report received from [place], [place].  Patient is being transferred from room 1416.  Wife at the bedside, per report.

## 2014-09-02 NOTE — Progress Notes (Addendum)
[facility] PROGRESS NOTE  Walter Dennis QMG:[id number] DOB: February 27, 1951 DOA: 09/01/2014 PCP: Effie Berkshire A  Brief Summary  The patient is a 64 yo M with history of multiple myeloma, IgG-lambda dx 2008 s/p autologous stem cell transplant 2009, most recently with disease progression on carfilzomib, lenalidomide, and dexamethasone.  He started bendamustine and pomalidomide on 08/29/2014.  He has history of HTN.  Per patient, he has had fevers, chills, night sweats attributed to his MM for the last year.  He will rigor and have associated SOB which typically resolves on its own.  Separate from these symptoms, he has had intermittent severe exertional chest pain which radiates to both shoulders which is severe and associated with SOB, mild nausea, significant lightheadedness and diaphoresis.  Over the last few weeks, he has had difficulty walking up a flight of stairs before becoming SOB.  The last few days, he cannot walk across a room before he develops chest pain.  Pain is relieved by rest.  He had a CT scan which demonstrated no evidence of pneumonia or PE but did demonstrated three vessel calcifications.  Per oncology, his chemotherapy does not typically cause myocarditis or pericarditis or acute heart failure.  Cardiology has been consulted for possible unstable angina.  I have notified oncology at [facility] of patient's admission and Dr. Benay Spice has been added to the rounding list here at [facility].    Assessment/Plan   Sepsis with fever, tachycardia, tachypnea, AMS in setting of MM and rapidly dropping WBC count concerning for febrile neutropenia.  Had history of aspergillosis infection during stem cell transplant in 2009 and has been immunosuppressed for long time due to progressive myeloma and chemotherapy -  Sepsis protocol with lactic acid -  IV bolus -  Blood cultures -  Repeat CXR and UA -  Start vancomycin and zosyn >> change zosyn to fortaz 2/2 concern for worsening cytopenias and CSF  penetration -  Taper abx based on culture data -  ID consultation:  Recommending lumbar puncture with opening pressure and 20-5m of fluid -  Send for cell count, protein, glucose, culture, HSV, AFB, and fungal culture for now.  Additional tests to be ordered by ID -  Hold off on antiviral and antifungal medications at this time -  MRI brain with and without contrast -  Dr. VTommy Medalsuggested CT abd/pelvis with contrast on Wednesday if no obvious cause of fever yet identified -  Dr. RDoy Mince Neurology, has agreed to assist with lumbar puncture today.  Will resume DVT prophylaxis tomorrow morning.    Chest pain concerning for unstable angina.  He has no known plasmacytomas in the region.  DDx includes perimyocarditis give mildly elevated troponins.  Given calcifications on CT and longstanding HTN, unstable angina is concerning.  He had neg cath 4 years ago and negative myoview one year ago.  -  Telemetry -  A1c pending -  Continue daily aspirin -  Continue beta blocker  -  Start high dose statin -  Start NTG prn chest pain -  Maalox prn and continue PPI -  Hold off on heparin pending AMS work up  Acute hypoxic respiratory failure may be due to acute diastolic heart failure or ARDS from developing severe sepsis - lasix 426mIV once  Sudden somnolence and confusion, diffuse weakness, no focal deficits on exam.   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  He started bendamustine and pomalidomide on 08/29/2014.  -  Hold chemotherapy pending further investigation for infection -  Spoke with oncology at [facility] and Dr. Benay Spice is  following also  HTN, blood pressure mildly elevated  Pancytopenia due to MM and chemotherapy -  WBC trending down -  Platelet count lower than previous.  INR is normal, however.  No schistocytes on smear and given plt count still > 100K, TTP less likely.    Hyponatremia may be due to dehydration -  IVF  Acute urinary retention, foley catheter placed  Diet:  NPO Access:  PIV IVF:  resume Proph: heparin  Code Status: full Family Communication: patient and his wife Disposition Plan: transfer to stepdown   Consultants:  Cardiology  Procedures:  CT angio chest  Antibiotics:  vanco 6/21 >   Zosyn 6/21 x 1  Ceftazidime 6/21 >   HPI/Subjective:  When I first interviewed him this morning, he was alert, oriented and appropriate.  He joked with me and answered all of my questions appropriately.  About thirty minutes later, he developed acute onset confusion and lethargy.  He was unable to follow commands.    Objective: Filed Vitals:   09/01/14 1902 09/02/14 0434 09/02/14 0844 09/02/14 1058  BP: 138/71 139/69 126/57 164/75  Pulse: 82 87 85 115  Temp: 98.3 F (36.8 C) 100.5 F (38.1 C) 98.3 F (36.8 C) 98.7 F (37.1 C)  TempSrc: Oral Oral Oral Oral  Resp: _0 Height: 6' (1.829 m)     Weight: 99.791 kg (220 lb)     SpO2: 100% 98% 99% 100%    Intake/Output Summary (Last 24 hours) at 09/02/14 1126 Last data filed at 09/02/14 0421  Gross per 24 hour  Intake    480 ml  Output    700 ml  Net   -220 ml   Filed Weights   09/01/14 1902  Weight: 99.791 kg (220 lb)    Exam: Initial:    General:  Obese male in mild respiratory distress with SCM retractions and tachypnea and rigoring.  Very warm to touch.    HEENT:  NCAT, MMM  Cardiovascular:  Tachycardic, RR, nl S1, S2 no mrg, 2+ pulses, warm extremities  Respiratory:  CTAB  Abdomen:   NABS, soft, NT/ND  MSK:   Normal tone and bulk, no LEE  Neuro:  Grossly intact  Repeat: Eyes closed and had to  be prompted several times to open them.  I had to hold his eyes open for him to do pupillary reflex.  PERRL.   No facial droop Arms and legs 3/5 bilaterally, sensation intact to light touch all extremities, diffusely reduced reflexes Psych:  Not talking sensically  Data Reviewed: Basic Metabolic Panel:  Recent Labs Lab 09/01/14 1359 09/02/14 0810  NA 137 133*  K 4.4 3.6  CL 104 98*  CO2 24 24  GLUCOSE 122* 120*  BUN 17 18  CREATININE 1.26* 1.13  CALCIUM 7.6* 7.7*   Liver Function Tests:  Recent Labs Lab 09/02/14 0810  AST 17  ALT 39  ALKPHOS 78  BILITOT 0.8  PROT 9.1*  ALBUMIN 2.8*   No results for input(s): LIPASE, AMYLASE in the  last 168 hours. No results for input(s): AMMONIA in the last 168 hours. CBC:  Recent Labs Lab 09/01/14 1359 09/02/14 0810  WBC 2.4* 1.8*  NEUTROABS  --  1.3*  HGB 9.3* 9.0*  HCT 28.7* 28.2*  MCV 89.4 88.1  PLT 109* 105*   Cardiac Enzymes:  Recent Labs Lab 09/01/14 2005 09/01/14 2300 09/02/14 0134  TROPONINI 0.08* 0.09* 0.09*   BNP (last 3 results)  Recent Labs  09/01/14 1359  BNP 452.2*    ProBNP (last 3 results) No results for input(s): PROBNP in the last 8760 hours.  CBG: No results for input(s): GLUCAP in the last 168 hours.  No results found for this or any previous visit (from the past 240 hour(s)).   Studies: Dg Chest 2 View  09/01/2014   CLINICAL DATA:  Shortness of breath and chest pain for 2 days  EXAM: CHEST - 2 VIEW  COMPARISON:  08/08/2014  FINDINGS: Cardiac shadow is at the upper limits of normal in size but stable. The lungs are well aerated bilaterally. No focal infiltrate or sizable effusion is seen. A dual-lumen chest wall port is noted on the right stable from the prior exam. No acute bony abnormality is seen per  IMPRESSION: No active disease.   Electronically Signed   By: Inez Catalina M.D.   On: 09/01/2014 15:38   Ct Chest Wo Contrast  09/01/2014   CLINICAL DATA:  Dull generalized chest pain,  shortness of breath, diaphoresis starting around 0730 hours. History of myeloma. Ongoing chemotherapy treatment.  EXAM: CT CHEST WITHOUT CONTRAST  TECHNIQUE: Multidetector CT imaging of the chest was performed following the standard protocol without IV contrast.  COMPARISON:  08/05/2014  FINDINGS: CT CHEST FINDINGS  Mediastinum/Nodes: Heart is normal size. Coronary artery calcifications in the 3 major coronary vessels. Aortic calcifications. No aneurysm. Small scattered mediastinal lymph nodes, none pathologically enlarged. No axillary or visible hilar adenopathy.  Lungs/Pleura: Lungs are clear. No focal airspace opacities or suspicious nodules. No effusions.  Chest wall: Right chest wall Port-A-Cath noted in place. The tip is in the SVC. Mild bilateral gynecomastia. Chest wall soft tissues otherwise unremarkable.  Upper abdomen: Mild diffuse fatty infiltration of the liver. No acute findings in the visualized upper abdomen.  Musculoskeletal: No acute bony abnormality.  IMPRESSION: Coronary artery disease.  Fatty infiltration of the liver.  No acute findings.   Electronically Signed   By: Rolm Baptise M.D.   On: 09/01/2014 17:29    Scheduled Meds: . allopurinol  300 mg Oral Daily  . aspirin  81 mg Oral Daily  . atorvastatin  80 mg Oral q1800  . carvedilol  6.25 mg Oral BID WC  . [START ON 09/04/2014] fentaNYL  25 mcg Transdermal Q72H  . heparin  5,000 Units Subcutaneous 3 times per day  . irbesartan  150 mg Oral Daily  . pantoprazole  80 mg Oral Q1200  . piperacillin-tazobactam (ZOSYN)  IV  3.375 g Intravenous 3 times per day  . potassium chloride  10 mEq Oral Daily  . pregabalin  75 mg Oral BID  . sodium chloride  3 mL Intravenous Q12H  . vancomycin  1,250 mg Intravenous Q12H   Continuous Infusions:   Principal Problem:   Chest pain Active Problems:   Multiple myeloma   Essential hypertension   CKD (chronic kidney disease) stage 3, GFR 30-59 ml/min   GERD (gastroesophageal reflux disease)    Abdominal pain, chronic, epigastric    Time spent: 30 min  Janece Canterbury  [facility] Pager [phone]. If 7PM-7AM, please contact night-coverage at [email or web address], password [facility] 09/02/2014, 11:26 AM  LOS: 1 day

## 2014-09-02 NOTE — Care Management Note (Signed)
Case Management Note  Patient Details  Name: Walter Dennis MRN: [id number] Date of Birth: 02-19-1951  Subjective/Objective:  64 y/o m admitted w/Chest pain.Cardio cons in am.Hx:Multiple Myeloma.t-100.5,p-115.w-1.8.iv abx x2. From home.                  Action/Plan:d/c plan home.   Expected Discharge Date:   (unknown)               Expected Discharge Plan:  Home/Self Care  In-House Referral:     Discharge planning Services  CM Consult  Post Acute Care Choice:    Choice offered to:     DME Arranged:    DME Agency:     HH Arranged:    HH Agency:     Status of Service:  In process, will continue to follow  Medicare Important Message Given:    Date Medicare IM Given:    Medicare IM give by:    Date Additional Medicare IM Given:    Additional Medicare Important Message give by:     If discussed at [place] of Stay Meetings, dates discussed:    Additional Comments:  Dessa Phi, RN 09/02/2014, 12:04 PM

## 2014-09-02 NOTE — Progress Notes (Signed)
IP PROGRESS NOTE  Subjective:   Walter Dennis is known to me with a history of multiple myeloma. He began treatment on a clinical trial at [facility] 08/28/2013 with bendamustine/pomalidomide. He reports developing nausea, exertional dyspnea, and chest discomfort beginning on the evening of 617. The symptoms progressed over the weekend and he presented to the emergency room 09/01/2014. He was admitted for further evaluation of chest pain. He feels better at present.  Objective: Vital signs in last 24 hours: Blood pressure 174/65, pulse 113, temperature 100.6 F (38.1 C), temperature source Oral, resp. rate 20, height 6' (1.829 m), weight 220 lb (99.791 kg), SpO2 96 %.  Intake/Output from previous day: 06/20 0701 - 06/21 0700 In: 480 [P.O.:480] Out: 700 [Urine:700]  Physical Exam:  HEENT: No thrush Lungs: Clear bilaterally Cardiac: Regular rate and rhythm Abdomen: No hepatosplenomegaly, nontender Extremities: No leg edema   Portacath/PICC-without erythema  Lab Results:  Recent Labs  09/01/14 1359 09/02/14 0810  WBC 2.4* 1.8*  HGB 9.3* 9.0*  HCT 28.7* 28.2*  PLT 109* 105*    BMET  Recent Labs  09/01/14 1359 09/02/14 0810  NA 137 133*  K 4.4 3.6  CL 104 98*  CO2 24 24  GLUCOSE 122* 120*  BUN 17 18  CREATININE 1.26* 1.13  CALCIUM 7.6* 7.7*    Studies/Results: Dg Chest 2 View  09/01/2014   CLINICAL DATA:  Shortness of breath and chest pain for 2 days  EXAM: CHEST - 2 VIEW  COMPARISON:  08/08/2014  FINDINGS: Cardiac shadow is at the upper limits of normal in size but stable. The lungs are well aerated bilaterally. No focal infiltrate or sizable effusion is seen. A dual-lumen chest wall port is noted on the right stable from the prior exam. No acute bony abnormality is seen per  IMPRESSION: No active disease.   Electronically Signed   By: Inez Catalina M.D.   On: 09/01/2014 15:38   Ct Chest Wo Contrast  09/01/2014   CLINICAL DATA:  Dull generalized chest pain, shortness of  breath, diaphoresis starting around 0730 hours. History of myeloma. Ongoing chemotherapy treatment.  EXAM: CT CHEST WITHOUT CONTRAST  TECHNIQUE: Multidetector CT imaging of the chest was performed following the standard protocol without IV contrast.  COMPARISON:  08/05/2014  FINDINGS: CT CHEST FINDINGS  Mediastinum/Nodes: Heart is normal size. Coronary artery calcifications in the 3 major coronary vessels. Aortic calcifications. No aneurysm. Small scattered mediastinal lymph nodes, none pathologically enlarged. No axillary or visible hilar adenopathy.  Lungs/Pleura: Lungs are clear. No focal airspace opacities or suspicious nodules. No effusions.  Chest wall: Right chest wall Port-A-Cath noted in place. The tip is in the SVC. Mild bilateral gynecomastia. Chest wall soft tissues otherwise unremarkable.  Upper abdomen: Mild diffuse fatty infiltration of the liver. No acute findings in the visualized upper abdomen.  Musculoskeletal: No acute bony abnormality.  IMPRESSION: Coronary artery disease.  Fatty infiltration of the liver.  No acute findings.   Electronically Signed   By: Rolm Baptise M.D.   On: 09/01/2014 17:29   Dg Chest Port 1 View  09/02/2014   CLINICAL DATA:  Shortness of Breath  EXAM: PORTABLE CHEST - 1 VIEW  COMPARISON:  Chest radiograph and chest CT September 01, 2014  FINDINGS: There is generalized interstitial edema. There is no airspace consolidation. Heart is borderline enlarged with pulmonary venous hypertension. Port-A-Cath tip is in superior vena cava. No pneumothorax. No adenopathy.  IMPRESSION: Evidence of congestive heart failure.   Electronically Signed  By: Lowella Grip III M.D.   On: 09/02/2014 12:25    Medications: I have reviewed the patient's current medications.  Assessment/Plan:  1.Multiple myeloma, IgG lambda monoclonal protein  -Initial diagnosis 2008, bone marrow with a 40-50% plasma cells  -Lenalidomide 25 mg,day 1-21 and Decadron 40 mg weekly and 7824, complicated by  peripheral neuropathy  -Therapy change to bortezomib IV twice weekly plus Decadron  -High-dose chemotherapy with autologous stem cell transplantation 2009 at [facility]  -Bortezomib, cyclophosphamide, and Decadron December 2012 through February 2014 (discontinued secondary to pulmonary aspergillosis)  -January 2014 M spike rise to 1.1 g/dL from 0.1 g/dL in December 2013. Treatment started with Carlfilzomib, lenalidomide, and Decadron.  -treatment was restarted with Carlfilzomib, lenalidomide, and Decadron 08/16/2012.  -Restaging labs on 10/15/2012 consistent with improvement in the serum M. Protein.  -Continuation of Carfilzomib/Revlimid/dexamethasone.  -Continued improvement in the serum M spike and IgG level on 11/29/2012.  -Improvement in the serum M spike, slight increase in IgG on 01/03/2013.  -Improvement in the serum M spike, stable IgG, stable lambda free light chains 01/31/2013  -The IgG and serum M spike were slightly higher 02/28/2013, stable lambda light chains  -IgG, M spike, and lambda light chains slightly lower on 04/04/2013.  -Continuation of Carfilzomib/Revlimid/dexamethasone.  -IgG, M spike and lambda light chains stable 05/02/2013.  -IgG, serum M spike, and lambda free light chains stable on 07/11/2013; 08/08/2013.  -IgG, serum M spike, and lambda light chains higher 10/17/2013 after being off treatment during July 2015  - Carfilzomib/decadron continued with a cycle starting 10/17/2013  -Increase in serum M spike, light chains and IgG 02/13/2014. - Carfilzomib/ Decadron continued, Revlimid resumed. -Increased serum M spike, light chains, and IgG 06/12/2014: Carrfilzomib/Decadron/Revlimid discontinued -Bone marrow biopsy at [facility] 07/30/2014 consistent with plasma cell myeloma, 60% plasma cells  -Enrollment on a clinical trial at [facility] with bendamustine, pomalidomide, and Decadron-treatment started 08/29/2015 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 He has been unable to lower the Duragesic patch dose  4. Irritable bowel syndrome  5. Hypertension  6. "Chest pain " and dyspnea with exertion ,evaluated by cardiology, . Normal stress nuclear study 11/07/2012 and 08/07/2013.  7. history of Diarrhea. Most likely related to Revlimid, improved with Imodium 8. Febrile illness 12/05/2013 with associated diffuse bone pain-no source for infection identified  9. Pneumonia 08/05/2014, CT with changes consistent with an atypical infection, clinical improvement with Levaquin 10. Progressive anemia secondary to multiple myeloma 11. Admission 09/01/2014 with chest pain, nausea, and exertional dyspnea 12. Leukopenia/from cytopenia secondary to multiple myeloma, sepsis #  Mr. Harmening has multiple myeloma. He has been treated with multiple systemic therapies. He started on a clinical trial with bendamustine,pomalidomide, and Decadron on 08/29/2014. I cannot relate the presentation with dyspnea and chest pain to the systemic therapy. His symptoms are suggestive of angina. He remains at high risk for infection and was treated for pneumonia last month.  After I saw him this morning he developed an altered mental status and fever. He was transferred to the stepdown unit. I'm concerned he has a systemic infection.  Recommendations: 1. Hold pomalidomide 2. Broad-spectrum antibiotics, treatment for potential sepsis 3. Consider infectious disease consult and evaluation for CNS infection if the altered mental status persists.   LOS: 1 day   Kestrel Mis  09/02/2014, 2:00 PM

## 2014-09-02 NOTE — Progress Notes (Signed)
Patient's troponin x3 result came back elevated. Patient has no complaints of chest pain at present.  Paged Tylene Fantasia NP about the results. No new orders made.

## 2014-09-02 NOTE — Consult Note (Signed)
Patient ID: Walter Dennis MRN: [id number], DOB/AGE: 1950/07/07   Admit date: 09/01/2014   Primary Physician: Effie Berkshire A Primary Cardiologist: Nahser  Pt. Profile:  64 y/o male with multiple myeloma, undergoing chemo, admitted with chest pain and dyspnea.   Problem List  Past Medical History  Diagnosis Date  . Hypertension   . Cancer     Myeloma    Past Surgical History  Procedure Laterality Date  . Portacath placement      right chest  . Cardiac catheterization      2012 - nml in [place]  No Known Allergies  HPI Note: most of HPI obtained through chart review and by patient's wife. Patient is now with sudden somnolence/ AMS and fever. No acute distress. No tachypnea. Primary team aware. STAT ABG, CXR, blood cultures and UA pending.   64 y/o male with a h/o multiple myeloma, followed at [facility], and HTN who presented to the [facility] ED 09/01/14 with a complaint of chest pain accompanied by a 1 week history of worsening rigors/ chills. He recently underwent a change in his chemotherapy regimen 6/17. It is felt that his rigos/chills are likely secondary to his multiple myeloma. Cardiology has been consulted to evaluate his exertional chest pain.   According to records, he underwent a heart cath in 2012 in [place] that was normal. He also has a NST in 8/214 that was normal w/o ischemia. EF was 66% with normal wall motion.   His wife reports that he he first started complaining of chest discomfort roughly 6 months ago. Symptoms have progressively worsened over the last several months. He has complained of exertional chest discomfort and dyspnea. His chest discomfort is described as a burning sensation. Over the last several weeks, he complained of occasional resting chest pain. No syncope/ near syncope.  Review of EKGs shows he has a new LBBB (first noted on EKG 08/06/14, but no ischemic w/u). This was ordered when he presented to the [facility] ED 5/24 with CP and  dyspnea. He apparently had a positive D-dimer test at his PCP office and was referred to the ED for CT angiogram to r/o PE. STAT troponin was negative. The CT study was negative for PE but suggestive of multi focal airspace disease c/w PNA. Per ED physician note, he did not meet criteria for HCAP. He was prescribed a 5 day course of Levaquin then discharged home directly from the ED.   He had a repeat CT scan ordered yesterday when he was first admitted. This demonstrated coronary artery calcifications in the 3 major coronary vessels. Also + for aortic calcifications but no aneurysm. Troponins are mildly elevated at 0.08, 0.09, 0.09. Today's labs show leukopenia with WBC at 1.8. Hgb 9.0. He is now febrile with Temp of 100.6 (38.1). Telemetry shows sinus tach.     Home Medications  Prior to Admission medications   Medication Sig Start Date End Date Taking? Authorizing Provider  acetaminophen (TYLENOL) 325 MG tablet Take 2 tablets by mouth every 4 (four) hours as needed. Pain/fever   Yes Historical Provider, MD  acyclovir (ZOVIRAX) 400 MG tablet Take 400 mg by mouth every 12 (twelve) hours. Take 1 tablet by mouth every 12 hours to prevent shingles while on chemo   Yes Historical Provider, MD  allopurinol (ZYLOPRIM) 300 MG tablet Take 1 tablet by mouth daily. 07/30/14 07/30/15 Yes Historical Provider, MD  aspirin 81 MG chewable tablet Chew 81 mg by mouth  daily.   Yes Historical Provider, MD  carvedilol (COREG) 6.25 MG tablet Take 6.25 mg by mouth 2 (two) times daily with a meal.   Yes Historical Provider, MD  diphenoxylate-atropine (LOMOTIL) 2.5-0.025 MG per tablet Take 1 tablet by mouth 4 (four) times daily as needed for diarrhea or loose stools.   Yes Historical Provider, MD  esomeprazole (NEXIUM) 40 MG capsule Take 40 mg by mouth daily at 12 noon.   Yes Historical Provider, MD  fentaNYL (DURAGESIC - DOSED MCG/HR) 25 MCG/HR patch Place 1 patch (25 mcg total) onto the skin every 3 (three) days. 07/01/14   Yes Ladell Pier, MD  furosemide (LASIX) 40 MG tablet TAKE 1 TABLET BY MOUTH EVERY DAY 04/02/14  Yes Ladell Pier, MD  nitroGLYCERIN (NITROSTAT) 0.3 MG SL tablet Place 1 tablet (0.3 mg total) under the tongue every 5 (five) minutes as needed for chest pain. No more than 3 doses in period of 15 minutes per episode. 09/28/12  Yes Antonietta Breach, PA-C  ondansetron (ZOFRAN) 8 MG tablet Take 1 tablet (8 mg total) by mouth every 12 (twelve) hours as needed for nausea. 11/29/13  Yes Ladell Pier, MD  oxyCODONE (OXY IR/ROXICODONE) 5 MG immediate release tablet Take 1-2 tablets by mouth every 4 (four) hours as needed for breakthrough pain. pain 07/30/14  Yes Historical Provider, MD  potassium chloride (K-DUR) 10 MEQ tablet Take 1 tablet (10 mEq total) by mouth daily. 01/02/14  Yes Thayer Headings, MD  pregabalin (LYRICA) 75 MG capsule Take 1 capsule (75 mg total) by mouth 2 (two) times daily. 05/15/14  Yes Ladell Pier, MD  PRESCRIPTION MEDICATION Combination of Bendamustine and Pomalidomide- study Chemo   Yes Historical Provider, MD  PROAIR HFA 108 (90 BASE) MCG/ACT inhaler Inhale 1-2 puffs into the lungs every 6 (six) hours as needed for wheezing or shortness of breath.  08/08/14  Yes Historical Provider, MD  valsartan (DIOVAN) 160 MG tablet Take 1 tablet (160 mg total) by mouth daily. 01/02/14  Yes Thayer Headings, MD  levofloxacin (LEVAQUIN) 750 MG tablet Take 1 tablet (750 mg total) by mouth daily. Patient not taking: Reported on 09/01/2014 08/06/14   Everlene Balls, MD    Family History  Family History  Problem Relation Age of Onset  . Hypertension Mother   . Hypertension Father   . Hypertension Sister   . Hypertension Brother   . Heart disease Father     Social History  History   Social History  . Marital Status: Married    Spouse Name: N/A  . Number of Children: N/A  . Years of Education: N/A   Occupational History  . Not on file.   Social History Main Topics  . Smoking status:  Former Smoker    Quit date: 09/29/2002  . Smokeless tobacco: Never Used  . Alcohol Use: No  . Drug Use: No  . Sexual Activity: Not on file   Other Topics Concern  . Not on file   Social History Narrative     Review of Systems General:  No chills, fever, night sweats or weight changes.  Cardiovascular:  No chest pain, dyspnea on exertion, edema, orthopnea, palpitations, paroxysmal nocturnal dyspnea. Dermatological: No rash, lesions/masses Respiratory: No cough, dyspnea Urologic: No hematuria, dysuria Abdominal:   No nausea, vomiting, diarrhea, bright red blood per rectum, melena, or hematemesis Neurologic:  No visual changes, wkns, changes in mental status. All other systems reviewed and are otherwise negative except as noted above.  Physical Exam  Blood pressure 126/57, pulse 85, temperature 98.3 F (36.8 C), temperature source Oral, resp. rate 20, height 6' (1.829 m), weight 220 lb (99.791 kg), SpO2 99 %.  General: Pleasant, NAD Psych: lethargic/somnolent  Neuro: AMS/lethargic but opens eyes on command HEENT: Normal  Neck: Supple without bruits or JVD. Lungs:  Resp regular and unlabored, CTA. Heart: RRR no s3, s4, or murmurs. Abdomen: Soft, non-tender, non-distended, BS + x 4.  Extremities: No clubbing, cyanosis or edema. DP/PT/Radials 2+ and equal bilaterally.  Labs  Troponin [facility] of Care Test)  Recent Labs  09/01/14 1408  TROPIPOC 0.02    Recent Labs  09/01/14 2005 09/01/14 2300 09/02/14 0134  TROPONINI 0.08* 0.09* 0.09*   Lab Results  Component Value Date   WBC 1.8* 09/02/2014   HGB 9.0* 09/02/2014   HCT 28.2* 09/02/2014   MCV 88.1 09/02/2014   PLT 105* 09/02/2014    Recent Labs Lab 09/02/14 0810  NA 133*  K 3.6  CL 98*  CO2 24  BUN 18  CREATININE 1.13  CALCIUM 7.7*  PROT 9.1*  BILITOT 0.8  ALKPHOS 78  ALT 39  AST 17  GLUCOSE 120*   No results found for: CHOL, HDL, LDLCALC, TRIG Lab Results  Component Value Date   DDIMER 2.17*  08/05/2014     Radiology/Studies  Dg Chest 2 View  09/01/2014   CLINICAL DATA:  Shortness of breath and chest pain for 2 days  EXAM: CHEST - 2 VIEW  COMPARISON:  08/08/2014  FINDINGS: Cardiac shadow is at the upper limits of normal in size but stable. The lungs are well aerated bilaterally. No focal infiltrate or sizable effusion is seen. A dual-lumen chest wall port is noted on the right stable from the prior exam. No acute bony abnormality is seen per  IMPRESSION: No active disease.   Electronically Signed   By: Inez Catalina M.D.   On: 09/01/2014 15:38   Ct Chest Wo Contrast  09/01/2014   CLINICAL DATA:  Dull generalized chest pain, shortness of breath, diaphoresis starting around 0730 hours. History of myeloma. Ongoing chemotherapy treatment.  EXAM: CT CHEST WITHOUT CONTRAST  TECHNIQUE: Multidetector CT imaging of the chest was performed following the standard protocol without IV contrast.  COMPARISON:  08/05/2014  FINDINGS: CT CHEST FINDINGS  Mediastinum/Nodes: Heart is normal size. Coronary artery calcifications in the 3 major coronary vessels. Aortic calcifications. No aneurysm. Small scattered mediastinal lymph nodes, none pathologically enlarged. No axillary or visible hilar adenopathy.  Lungs/Pleura: Lungs are clear. No focal airspace opacities or suspicious nodules. No effusions.  Chest wall: Right chest wall Port-A-Cath noted in place. The tip is in the SVC. Mild bilateral gynecomastia. Chest wall soft tissues otherwise unremarkable.  Upper abdomen: Mild diffuse fatty infiltration of the liver. No acute findings in the visualized upper abdomen.  Musculoskeletal: No acute bony abnormality.  IMPRESSION: Coronary artery disease.  Fatty infiltration of the liver.  No acute findings.   Electronically Signed   By: Rolm Baptise M.D.   On: 09/01/2014 17:29   Ct Angio Chest Pe W/cm &/or Wo Cm  08/06/2014   CLINICAL DATA:  Shortness of breath and elevated D-dimer.  EXAM: CT ANGIOGRAPHY CHEST WITH  CONTRAST  TECHNIQUE: Multidetector CT imaging of the chest was performed using the standard protocol during bolus administration of intravenous contrast. Multiplanar CT image reconstructions and MIPs were obtained to evaluate the vascular anatomy.  CONTRAST:  168m OMNIPAQUE IOHEXOL 350 MG/ML SOLN  COMPARISON:  None.  FINDINGS: THORACIC INLET/BODY WALL:  Mild symmetric gynecomastia.  Right IJ porta catheter, tip at the SVC level.  No adenopathy.  MEDIASTINUM:  Normal heart size. No pericardial effusion. Atherosclerosis, including the coronary arteries. No aortic aneurysm or dissection. No evidence of pulmonary embolism. Prominent mediastinal and hilar lymph nodes, likely reactive.  LUNG WINDOWS:  There is bronchial wall thickening, especially the lower lobes with patchy clusters of ill-defined centrilobular nodules in the posterior upper lobes, dependent right lower lobe, and right middle lobe. Appearance is most consistent with an atypical infection. No interlobular septal thickening, effusion, or pneumothorax. Few emphysematous spaces.  UPPER ABDOMEN:  Hepatic steatosis.  OSSEOUS:  Osteopenia without fracture.  No discrete myelomatous lesion.  Review of the MIP images confirms the above findings.  IMPRESSION: 1. Multi focal airspace disease consistent with pneumonia. 2. No evidence of pulmonary embolism. 3. Mild emphysema. 4. Hepatic steatosis.   Electronically Signed   By: Monte Fantasia M.D.   On: 08/06/2014 00:12    ECG  LBBB, 78 bpm   ASSESSMENT AND PLAN  Principal Problem:   Chest pain Active Problems:   Multiple myeloma   Essential hypertension   CKD (chronic kidney disease) stage 3, GFR 30-59 ml/min   GERD (gastroesophageal reflux disease)   Abdominal pain, chronic, epigastric   1. Chest Pain + Dyspnea:  Given symptoms of exertional chest pain and dyspnea, new LBBB, mildly elevated troponins and CT findings of coronary artery calcifications in the 3 major coronary vessels, he likely has  significant CAD. However, given his current condition/ septic picture, he is not a candidate for cath at this time. Continue medical therapy for presumed CAD for now: ASA, statin, BB and ARB. MD to make determination regarding need for IV heparin. We will continue to follow along and will re-consider LHC once his acute medical problems improve.    Signed, Lyda Jester, PA-C 09/02/2014, 10:56 AM   History and all data above reviewed.  Patient examined.  I agree with the findings as above.  The patient was transferred to the ICU urgently because of AMS with confusion.  He currently is oriented to person and place although there is clearly some confusion.  He says that he was having chest pain but he is not clear on the type, time or circumstances.  He is currently not having chest pain.  He denies any SOB.   He does have a LBBB which is new between a 5/14 EKG and a 5/24 EKG.  His troponin has been very mildly elevated and flat.  He does have coronary calcification on a CT.  Previous CAD work up has not demonstrated obstructive disease.  The patient exam reveals COR:RRR  ,  Lungs: Clear  ,  Abd: Positive bowel sounds, no rebound no guarding, Ext No edema.  Neuro:  Nonfocal  .  All available labs, radiology testing, previous records reviewed. Agree with documented assessment and plan. Chest pain:  I cannot get a history from the patient.  However, the report in the chart is concerning for new onset exertional angina.  He has mild troponin elevation and coronary calcium.  Cardiac cath would be indicated.  However, with his acute neurologic change we will hold off on this.  He currently has no chest pain.  Continue ASA but hold off on heparin.     Jeneen Rinks Anquan Azzarello  3:44 PM  09/02/2014

## 2014-09-02 NOTE — Evaluation (Signed)
Clinical/Bedside Swallow Evaluation Patient Details  Name: Walter Dennis MRN: [id number] Date of Birth: Jan 06, 1951  Today's Date: 09/02/2014 Time: SLP Start Time (ACUTE ONLY): 9211 SLP Stop Time (ACUTE ONLY): 1416 SLP Time Calculation (min) (ACUTE ONLY): 9 min  Past Medical History:  Past Medical History  Diagnosis Date  . Hypertension   . Cancer     Myeloma   Past Surgical History:  Past Surgical History  Procedure Laterality Date  . Portacath placement      right chest  . Cardiac catheterization      2012 - nml in [place]   HPI:  64 y.o. male admitted after chest pain. Pt developed altered mental status and transferred to ICU. PMH: HTN, cancer. Treated for Pneumonia 3 wks ago. CXR Evidence of congestive heart failure. Failed RN stroke swallow screen.   Assessment / Plan / Recommendation Clinical Impression  Oropharyngeal swallow ability within functional limits. Exhibited one mild delayed cough post straw sip thin. Timing of swallow and laryngeal elevation appeared functional. Recommend regular texture and thin liquids once MD approves. Pt going to stat head CT following swallow assessment. Follow up once for safety and efficiency.    Aspiration Risk  Mild    Diet Recommendation Thin (regular)   Medication Administration: Whole meds with puree Compensations: Slow rate;Small sips/bites    Other  Recommendations Oral Care Recommendations: Oral care BID   Follow Up Recommendations       Frequency and Duration min 1 x/week  2 weeks   Pertinent Vitals/Pain none      Swallow Study          Oral/Motor/Sensory Function Overall Oral Motor/Sensory Function: Appears within functional limits for tasks assessed   Ice Chips Ice chips: Not tested   Thin Liquid Thin Liquid: Impaired Presentation: Straw;Cup Pharyngeal  Phase Impairments: Cough - Delayed    Nectar Thick Nectar Thick Liquid: Not tested   Honey Thick Honey Thick Liquid: Not tested   Puree Puree: Within  functional limits   Solid   GO    Solid: Within functional limits       Walter Dennis 09/02/2014,3:13 PM   Walter Dennis.Ed [employer] [phone]

## 2014-09-03 ENCOUNTER — Inpatient Hospital Stay (HOSPITAL_COMMUNITY): Payer: BLUE CROSS/BLUE SHIELD

## 2014-09-03 DIAGNOSIS — R079 Chest pain, unspecified: Secondary | ICD-10-CM

## 2014-09-03 DIAGNOSIS — R7881 Bacteremia: Secondary | ICD-10-CM | POA: Diagnosis present

## 2014-09-03 DIAGNOSIS — K219 Gastro-esophageal reflux disease without esophagitis: Secondary | ICD-10-CM

## 2014-09-03 DIAGNOSIS — R0600 Dyspnea, unspecified: Secondary | ICD-10-CM | POA: Diagnosis present

## 2014-09-03 LAB — COMPREHENSIVE METABOLIC PANEL
ALBUMIN: 2.3 g/dL — AB (ref 3.5–5.0)
ALK PHOS: 86 U/L (ref 38–126)
ALT: 45 U/L (ref 17–63)
ANION GAP: 9 (ref 5–15)
AST: 35 U/L (ref 15–41)
BUN: 21 mg/dL — ABNORMAL HIGH (ref 6–20)
CALCIUM: 6.7 mg/dL — AB (ref 8.9–10.3)
CO2: 22 mmol/L (ref 22–32)
Chloride: 102 mmol/L (ref 101–111)
Creatinine, Ser: 1.56 mg/dL — ABNORMAL HIGH (ref 0.61–1.24)
GFR calc non Af Amer: 46 mL/min — ABNORMAL LOW (ref >60)
GFR, EST AFRICAN AMERICAN: 53 mL/min — AB (ref >60)
Glucose, Bld: 170 mg/dL — ABNORMAL HIGH (ref 65–99)
Potassium: 3.2 mmol/L — ABNORMAL LOW (ref 3.5–5.1)
Sodium: 133 mmol/L — ABNORMAL LOW (ref 135–145)
TOTAL PROTEIN: 7.8 g/dL (ref 6.5–8.1)
Total Bilirubin: 1 mg/dL (ref 0.3–1.2)

## 2014-09-03 LAB — CBC WITH DIFFERENTIAL/PLATELET
BASOS ABS: 0 10*3/uL (ref 0.0–0.1)
BASOS PCT: 1 % (ref 0–1)
EOS PCT: 4 % (ref 0–5)
Eosinophils Absolute: 0.1 10*3/uL (ref 0.0–0.7)
HCT: 25 % — ABNORMAL LOW (ref 39.0–52.0)
Hemoglobin: 8.1 g/dL — ABNORMAL LOW (ref 13.0–17.0)
Lymphocytes Relative: 8 % — ABNORMAL LOW (ref 12–46)
Lymphs Abs: 0.1 10*3/uL — ABNORMAL LOW (ref 0.7–4.0)
MCH: 28.6 pg (ref 26.0–34.0)
MCHC: 32.4 g/dL (ref 30.0–36.0)
MCV: 88.3 fL (ref 78.0–100.0)
Monocytes Absolute: 0.2 10*3/uL (ref 0.1–1.0)
Monocytes Relative: 9 % (ref 3–12)
Neutro Abs: 1.5 10*3/uL — ABNORMAL LOW (ref 1.7–7.7)
Neutrophils Relative %: 79 % — ABNORMAL HIGH (ref 43–77)
Platelets: 92 10*3/uL — ABNORMAL LOW (ref 150–400)
RBC: 2.83 MIL/uL — ABNORMAL LOW (ref 4.22–5.81)
RDW: 17.9 % — AB (ref 11.5–15.5)
WBC: 1.9 10*3/uL — AB (ref 4.0–10.5)

## 2014-09-03 LAB — MAGNESIUM: MAGNESIUM: 1.4 mg/dL — AB (ref 1.7–2.4)

## 2014-09-03 LAB — LACTIC ACID, PLASMA
Lactic Acid, Venous: 2.4 mmol/L (ref 0.5–2.0)
Lactic Acid, Venous: 0.9 mmol/L (ref 0.5–2.0)

## 2014-09-03 LAB — AMMONIA: Ammonia: 44 umol/L — ABNORMAL HIGH (ref 9–35)

## 2014-09-03 LAB — PREALBUMIN: Prealbumin: 14.6 mg/dL — ABNORMAL LOW (ref 18–38)

## 2014-09-03 LAB — PROTIME-INR
INR: 1.56 — ABNORMAL HIGH (ref 0.00–1.49)
Prothrombin Time: 18.7 seconds — ABNORMAL HIGH (ref 11.6–15.2)

## 2014-09-03 LAB — HIV ANTIBODY (ROUTINE TESTING W REFLEX): HIV Screen 4th Generation wRfx: NONREACTIVE

## 2014-09-03 MED ORDER — POTASSIUM CHLORIDE CRYS ER 20 MEQ PO TBCR
20.0000 meq | EXTENDED_RELEASE_TABLET | Freq: Once | ORAL | Status: AC
  Administered 2014-09-03: 20 meq via ORAL
  Filled 2014-09-03: qty 1

## 2014-09-03 MED ORDER — DIPHENHYDRAMINE HCL 50 MG/ML IJ SOLN
25.0000 mg | Freq: Once | INTRAMUSCULAR | Status: AC
  Administered 2014-09-03: 25 mg via INTRAVENOUS
  Filled 2014-09-03: qty 1

## 2014-09-03 MED ORDER — SODIUM CHLORIDE 0.9 % IV SOLN
INTRAVENOUS | Status: DC
  Administered 2014-09-03 – 2014-09-05 (×3): via INTRAVENOUS
  Administered 2014-09-06: 50 mL/h via INTRAVENOUS

## 2014-09-03 MED ORDER — FENTANYL 50 MCG/HR TD PT72
50.0000 ug | MEDICATED_PATCH | TRANSDERMAL | Status: DC
  Administered 2014-09-03 – 2014-09-12 (×4): 50 ug via TRANSDERMAL
  Filled 2014-09-03 (×5): qty 1

## 2014-09-03 MED ORDER — CEFTAZIDIME 2 G IJ SOLR
2.0000 g | Freq: Three times a day (TID) | INTRAMUSCULAR | Status: DC
  Administered 2014-09-03 – 2014-09-04 (×2): 2 g via INTRAVENOUS
  Filled 2014-09-03 (×3): qty 2

## 2014-09-03 MED ORDER — METRONIDAZOLE 500 MG PO TABS
500.0000 mg | ORAL_TABLET | Freq: Three times a day (TID) | ORAL | Status: DC
  Administered 2014-09-03: 500 mg via ORAL
  Filled 2014-09-03: qty 1

## 2014-09-03 MED ORDER — METRONIDAZOLE IN NACL 5-0.79 MG/ML-% IV SOLN
500.0000 mg | Freq: Three times a day (TID) | INTRAVENOUS | Status: DC
  Administered 2014-09-04 – 2014-09-05 (×5): 500 mg via INTRAVENOUS
  Filled 2014-09-03 (×5): qty 100

## 2014-09-03 MED ORDER — MAGNESIUM SULFATE IN D5W 10-5 MG/ML-% IV SOLN
1.0000 g | Freq: Once | INTRAVENOUS | Status: AC
  Administered 2014-09-03: 1 g via INTRAVENOUS
  Filled 2014-09-03: qty 100

## 2014-09-03 MED ORDER — PIPERACILLIN-TAZOBACTAM 3.375 G IVPB
3.3750 g | Freq: Three times a day (TID) | INTRAVENOUS | Status: DC
  Administered 2014-09-03: 3.375 g via INTRAVENOUS
  Filled 2014-09-03: qty 50

## 2014-09-03 MED ORDER — PRO-STAT 64 PO LIQD
30.0000 mL | Freq: Two times a day (BID) | ORAL | Status: DC
  Administered 2014-09-04 – 2014-09-12 (×3): 30 mL via ORAL
  Filled 2014-09-03 (×6): qty 30

## 2014-09-03 MED ORDER — DIPHENHYDRAMINE HCL 25 MG PO CAPS: 25.0000 mg | ORAL_CAPSULE | Freq: Three times a day (TID) | ORAL | Status: DC | PRN

## 2014-09-03 NOTE — Progress Notes (Signed)
Patient Profile: 64 y/o male with multiple myeloma, undergoing chemo, admitted with chest pain and dyspnea.   Subjective: Better today. Alert. Denies resting chest pain. No dyspnea at rest. Main complaint is rigors.   Objective: Vital signs in last 24 hours: Temp:  [98.3 F (36.8 C)-104.5 F (40.3 C)] 101 F (38.3 C) (06/22 0400) Pulse Rate:  [85-115] 108 (06/21 1731) Resp:  [16-30] 16 (06/22 0638) BP: (88-174)/(33-133) 107/38 mmHg (06/22 0638) SpO2:  [94 %-100 %] 100 % (06/22 2979) Weight:  [218 lb 4.1 oz (99 kg)-225 lb 15.5 oz (102.5 kg)] 218 lb 4.1 oz (99 kg) (06/22 0400) Last BM Date: 09/02/14  Intake/Output from previous day: 06/21 0701 - 06/22 0700 In: 3125 [P.O.:360; I.V.:565; IV Piggyback:1700] Out: 4502 [Urine:4501; Stool:1] Intake/Output this shift:    Medications Current Facility-Administered Medications  Medication Dose Route Frequency Provider Last Rate Last Dose  .  stroke: mapping our early stages of recovery book   Does not apply Once Janece Canterbury, MD   1 each at 09/02/14 1315  . 0.9 %  sodium chloride infusion  250 mL Intravenous PRN Waldemar Dickens, MD   Stopped at 09/02/14 1800  . 0.9 %  sodium chloride infusion   Intravenous Continuous Gardiner Barefoot, NP 75 mL/hr at 09/02/14 2241    . acetaminophen (TYLENOL) suppository 650 mg  650 mg Rectal Q6H PRN Waldemar Dickens, MD   650 mg at 09/03/14 0217  . acetaminophen (TYLENOL) tablet 650 mg  650 mg Oral Q4H PRN Waldemar Dickens, MD   650 mg at 09/02/14 2055  . acyclovir (ZOVIRAX) 775 mg in dextrose 5 % 150 mL IVPB  775 mg Intravenous 3 times per day Randa Spike, RPH   775 mg at 09/03/14 0514  . allopurinol (ZYLOPRIM) tablet 300 mg  300 mg Oral Daily Waldemar Dickens, MD   300 mg at 09/02/14 1641  . antiseptic oral rinse (CPC / CETYLPYRIDINIUM CHLORIDE 0.05%) solution 7 mL  7 mL Mouth Rinse BID Janece Canterbury, MD   7 mL at 09/02/14 2200  . aspirin chewable tablet 81 mg  81 mg Oral Daily Waldemar Dickens, MD   81 mg at 09/02/14 1641  . atorvastatin (LIPITOR) tablet 80 mg  80 mg Oral q1800 Janece Canterbury, MD   80 mg at 09/02/14 1740  . carvedilol (COREG) tablet 6.25 mg  6.25 mg Oral BID WC Janece Canterbury, MD   6.25 mg at 09/02/14 1642  . cefTAZidime (FORTAZ) 2 g in dextrose 5 % 50 mL IVPB  2 g Intravenous 3 times per day Janece Canterbury, MD   2 g at 09/03/14 8921  . [START ON 09/05/2014] fentaNYL (DURAGESIC - dosed mcg/hr) 50 mcg  50 mcg Transdermal Q72H Janece Canterbury, MD      . gi cocktail (Maalox,Lidocaine,Donnatal)  30 mL Oral QID PRN Waldemar Dickens, MD      . heparin injection 5,000 Units  5,000 Units Subcutaneous 3 times per day Janece Canterbury, MD   5,000 Units at 09/03/14 0514  . ibuprofen (ADVIL,MOTRIN) tablet 800 mg  800 mg Oral Q6H PRN Janece Canterbury, MD      . irbesartan (AVAPRO) tablet 150 mg  150 mg Oral Daily Janece Canterbury, MD   150 mg at 09/02/14 1737  . magnesium sulfate IVPB 1 g 100 mL  1 g Intravenous Once Gardiner Barefoot, NP      . ondansetron [facility]) injection 4 mg  4 mg Intravenous  Q6H PRN Waldemar Dickens, MD   4 mg at 09/02/14 1245  . oxyCODONE (Oxy IR/ROXICODONE) immediate release tablet 5-10 mg  5-10 mg Oral Q4H PRN Waldemar Dickens, MD   5 mg at 09/02/14 2350  . pantoprazole (PROTONIX) EC tablet 80 mg  80 mg Oral Q1200 Waldemar Dickens, MD   80 mg at 09/02/14 1643  . potassium chloride (K-DUR) CR tablet 10 mEq  10 mEq Oral Daily Waldemar Dickens, MD   10 mEq at 09/02/14 1643  . potassium chloride SA (K-DUR,KLOR-CON) CR tablet 20 mEq  20 mEq Oral Once Gardiner Barefoot, NP      . pregabalin (LYRICA) capsule 75 mg  75 mg Oral BID Waldemar Dickens, MD   75 mg at 09/02/14 2200  . sodium chloride 0.9 % injection 10-40 mL  10-40 mL Intracatheter PRN Janece Canterbury, MD      . sodium chloride 0.9 % injection 3 mL  3 mL Intravenous Q12H Waldemar Dickens, MD   3 mL at 09/02/14 2129  . sodium chloride 0.9 % injection 3 mL  3 mL Intravenous PRN Waldemar Dickens, MD       . vancomycin (VANCOCIN) 1,250 mg in sodium chloride 0.9 % 250 mL IVPB  1,250 mg Intravenous Q12H Janece Canterbury, MD   1,250 mg at 09/02/14 2108   Facility-Administered Medications Ordered in Other Encounters  Medication Dose Route Frequency Provider Last Rate Last Dose  . sodium chloride 0.9 % injection 10 mL  10 mL Intravenous PRN Ladell Pier, MD   10 mL at 11/14/13 1510    PE: General appearance: alert, cooperative and no distress Neck: no carotid bruit and no JVD Lungs: clear to auscultation bilaterally Heart: regular rate and rhythm, S1, S2 normal, no murmur, click, rub or gallop Extremities: no LEE Pulses: 2+ and symmetric Skin: warm and dry Neurologic: Grossly normal  Lab Results:   Recent Labs  09/01/14 1359 09/02/14 0810 09/03/14 0430  WBC 2.4* 1.8* 1.9*  HGB 9.3* 9.0* 8.1*  HCT 28.7* 28.2* 25.0*  PLT 109* 105* 92*   BMET  Recent Labs  09/01/14 1359 09/02/14 0810 09/03/14 0430  NA 137 133* 133*  K 4.4 3.6 3.2*  CL 104 98* 102  CO2 _0 GLUCOSE 122* 120* 170*  BUN 17 18 21*  CREATININE 1.26* 1.13 1.56*  CALCIUM 7.6* 7.7* 6.7*   PT/INR  Recent Labs  09/02/14 0810 09/03/14 0430  LABPROT 15.3* 18.7*  INR 1.20 1.56*   Cholesterol  Recent Labs  09/02/14 0810  CHOL 139   Cardiac Panel (last 3 results)  Recent Labs  09/01/14 2005 09/01/14 2300 09/02/14 0134  TROPONINI 0.08* 0.09* 0.09*    Assessment/Plan  Principal Problem:   Chest pain Active Problems:   Multiple myeloma   Essential hypertension   CKD (chronic kidney disease) stage 3, GFR 30-59 ml/min   GERD (gastroesophageal reflux disease)   Abdominal pain, chronic, epigastric   Cancer   FUO (fever of unknown origin)   Lethargy   Tachycardia   Neutropenic fever   Screen for STD (sexually transmitted disease)   Aspergillus pneumonia   1. Chest Pain + Dyspnea: Chest pain free at rest. However, given symptoms of exertional chest pain and dyspnea, new LBBB, mildly  elevated troponins and CT findings of coronary artery calcifications in the 3 major coronary vessels, a LHC is indicated. However, the patient is undergoing extensive w/u by ID and neurology given worsening  pancytopenia, fever and recent confusion. Will need to delay cath until his other medical issues improve. For now, continue medical therapy with ASA, BB, ARB and statin. 2D echo ordered. Results pending. Keep on telemetry. HR and BP both stable. Notify cardiology if the development of any resting chest pain.   2. Confusion: in the setting of multiple medical issues, including multiple myeloma, chemotherapy, worsening pancytopenia and fever. Patient's condition much improved today. A&Ox3. W/u per IM, ID and neurology pending.   3. Hypokalemia: K is  3.2. Supplement.   LOS: 2 days    Brittainy M. Ladoris Gene 09/03/2014 7:40 AM   History and all data above reviewed.  Patient examined.  I agree with the findings as above. No chest pain.  He is aching all over.  Feels very poorly particularly with fevers  The patient exam reveals COR:RRR, soft systolic murmur  ,  Lungs: Decreased breath sounds at the bases  ,  Abd: Positive bowel sounds, no rebound no guarding, Ext Trace edema  .  All available labs, radiology testing, previous records reviewed. Agree with documented assessment and plan. Chest pain:  This is currently a secondary issue.  Echo is being done.  I will follow up these results.  However, no invasive evaluation planned while acute issues (sepsis) is being addressed.    Jeneen Rinks Marizol Borror  1:27 PM  09/03/2014

## 2014-09-03 NOTE — Progress Notes (Signed)
Echocardiogram 2D Echocardiogram has been performed.  Walter Dennis 09/03/2014, 1:47 PM

## 2014-09-03 NOTE — Plan of Care (Signed)
RN paged this NP secondary to pt having hives after first dose of Zosyn. No previously documented hx of allergy to Zosyn or PCN. Benadryl given for hives. ID, Dr. Wendie Agreste, has been following this complicated pt with neutrapenic fever with ? Etiology. Abx were tapered and changed to Zosyn today by ID. Given allergy and complicated acute illness, this NP paged ID on call, Dr. Linus Salmons. Dr Linus Salmons decided to go with Tressie Ellis and Flagyl after discussion. Zosyn and penicillins added to allergy list.  Clance Boll, NP [facility]

## 2014-09-03 NOTE — Evaluation (Signed)
Speech Language Pathology Evaluation Patient Details Name: Walter Dennis MRN: [id number] DOB: [birth year] Today's Date: 09/03/2014 Time: 0272-5366 SLP Time Calculation (min) (ACUTE ONLY): 11 min  Problem List:  Patient Active Problem List   Diagnosis Date Noted  . Cancer   . FUO (fever of unknown origin)   . Lethargy   . Tachycardia   . Neutropenic fever   . Screen for STD (sexually transmitted disease)   . Aspergillus pneumonia   . CKD (chronic kidney disease) stage 3, GFR 30-59 ml/min 09/01/2014  . GERD (gastroesophageal reflux disease) 09/01/2014  . Abdominal pain, chronic, epigastric 09/01/2014  . Chronic diarrhea 07/30/2014  . Encounter for examination for normal comparison or control in clinical research program 07/30/2014  . Essential hypertension 02/11/2014  . Chest pain 07/24/2013  . Multiple myeloma 08/15/2012  . 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 CCF (congestive cardiac failure) 07/23/2012  . BP (high blood pressure) 07/23/2012  . 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 Disorder of peripheral nervous system 07/23/2012   Past Medical History:  Past Medical History  Diagnosis Date  . Hypertension   . Cancer     Myeloma   Past Surgical History:  Past Surgical History  Procedure Laterality Date  . Portacath placement      right chest  . Cardiac catheterization      2012 - nml in [place]   HPI:  63 y.o. maleadmitted after chest pain. History of multiple myeloma, undergoing chemo. Pt developed altered mental status and transferred to ICU. PMH: HTN, cancer. Treated for Pneumonia 3 wks ago. CXR Evidence of congestive heart failure. Failed RN stroke swallow screen.   Assessment / Plan / Recommendation Clinical Impression  Pt's comprehension and verbal expressive language are within functional limits. Speech clear and intelligible in conversation. Pt was somewhat distracted by "feeling bad and so cold". Cognitive function for verbal information appears functional. Pt  may experience difficulty in functional situations when presented with multiple information and demands. No ST recommended on aucte care, however he may benefit in next venue of care for higher level more complex information.    SLP Assessment  Patient does not need any further Speech Lanaguage Pathology Services    Follow Up Recommendations  None    Frequency and Duration        Pertinent Vitals/Pain Pain Assessment: No/denies pain   SLP Goals  Progression toward goals: Goals met, education completed, patient discharged from SLP  SLP Evaluation Prior Functioning  Cognitive/Linguistic Baseline: Within functional limits  Lives With: Spouse   Cognition  Overall Cognitive Status: Within Functional Limits for tasks assessed Arousal/Alertness: Awake/alert Orientation Level: Oriented X4 Attention: Sustained Sustained Attention: Appears intact Memory: Appears intact Awareness: Appears intact Problem Solving: Appears intact Safety/Judgment: Appears intact    Comprehension  Auditory Comprehension Overall Auditory Comprehension: Appears within functional limits for tasks assessed Visual Recognition/Discrimination Discrimination: Not tested Reading Comprehension Reading Status: Not tested    Expression Expression Primary Mode of Expression: Verbal Verbal Expression Pragmatics: No impairment Written Expression Dominant Hand: Right Written Expression: Not tested   Oral / Motor Oral Motor/Sensory Function Overall Oral Motor/Sensory Function: Appears within functional limits for tasks assessed Motor Speech Overall Motor Speech: Appears within functional limits for tasks assessed Intelligibility: Intelligible Motor Planning: Witnin functional limits   GO     Walter Dennis 09/03/2014, 11:36 AM  [name] [name].Ed [employer] [phone]

## 2014-09-03 NOTE — Progress Notes (Signed)
Initial Nutrition Assessment  DOCUMENTATION CODES:  Obesity unspecified  INTERVENTION: - Will order Prostat BID, each supplement provides 100 kcal, 15 grams of protein - RD will continue to monitor for needs  NUTRITION DIAGNOSIS:  Increased nutrient needs related to cancer and cancer related treatments as evidenced by estimated needs.  GOAL:  Patient will meet greater than or equal to 90% of their needs  MONITOR:  PO intake, Supplement acceptance, Weight trends, Labs  REASON FOR ASSESSMENT:  Malnutrition Screening Tool  ASSESSMENT: Pt is a 64 year old male admitted for chest pain. He is currently undergoing chemo treatment for multiple myeloma.  Pt seen for MST. BMI indicates obesity. Pt ate ~25% of toast and oatmeal for breakfast this AM. He reports decreased appetite x1 month with no associate taste alterations or nausea with intakes. He states weight loss of 7-8 lbs recently. Per weight hx review, pt has lost 7 lbs (3% body weight) in the past 6 months which is not significant for time frame.   Will order Prostat BID to supplement and monitor for need for additional supplements. Not meeting needs. Medications reviewed. Labs reviewed; Na: 133 mmol/L, K: 3.2 mmol/L, BUN/creatinine elevated, Ca: 6.7 mg/dL, GFR: 46.  Height:  Ht Readings from Last 1 Encounters:  09/02/14 6' (1.829 m)    Weight:  Wt Readings from Last 1 Encounters:  09/03/14 218 lb 4.1 oz (99 kg)    Ideal Body Weight:  80.1 kg (kg)  Wt Readings from Last 10 Encounters:  09/03/14 218 lb 4.1 oz (99 kg)  09/02/14 220 lb (99.791 kg)  08/12/14 220 lb 1.6 oz (99.837 kg)  06/23/14 221 lb 14.4 oz (100.653 kg)  06/12/14 221 lb 8 oz (100.472 kg)  05/15/14 220 lb 8 oz (100.018 kg)  05/15/14 220 lb 8 oz (100.018 kg)  04/17/14 221 lb 9.6 oz (100.517 kg)  03/20/14 225 lb (102.059 kg)  02/13/14 224 lb (101.606 kg)    BMI:  Body mass index is 29.59 kg/(m^2).  Estimated Nutritional Needs:  Kcal:   8527-7824  Protein:  100-110 grams  Fluid:  2.2-2.5 L/day  Skin:  Reviewed, no issues  Diet Order:  Diet Heart Room service appropriate?: Yes; Fluid consistency:: Thin  EDUCATION NEEDS:  No education needs identified at this time   Intake/Output Summary (Last 24 hours) at 09/03/14 0930 Last data filed at 09/03/14 0800  Gross per 24 hour  Intake   3275 ml  Output   4502 ml  Net  -1227 ml    Last BM:  6/22    Jarome Matin, RD, LDN Inpatient Clinical Dietitian Pager # [phone] After hours/weekend pager # [phone]

## 2014-09-03 NOTE — Progress Notes (Signed)
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  He started bendamustine and pomalidomide on 08/29/2014.  He has history of HTN.  Per patient, he has had fevers, chills, night sweats attributed to his MM for the last year.  He will rigor and have associated SOB which typically resolves on its own.  Separate from these symptoms, he has had intermittent severe exertional chest pain which radiates to both shoulders which is severe and associated with SOB, mild nausea, significant lightheadedness and diaphoresis.  Over the last few weeks, he has had difficulty walking up a flight of stairs before becoming SOB.  The last few days, he cannot walk across a room before he develops chest pain.  Pain is relieved by rest.  He had a CT scan which demonstrated no evidence of pneumonia or PE but did demonstrated three vessel calcifications.  Per oncology, his chemotherapy does not typically cause myocarditis or pericarditis or acute heart failure.  Cardiology has been consulted for possible unstable angina.  I have notified oncology at [facility] of patient's admission and Dr. Benay Spice has been added to the rounding list here at [facility].    Assessment/Plan   Sepsis with fever, tachycardia, tachypnea, AMS in setting of MM and rapidly dropping WBC count concerning for febrile neutropenia.  Had history of aspergillosis infection during stem cell transplant in 2009 and has been immunosuppressed for long time due to progressive myeloma and chemotherapy - ID and Neurology on board and assisting with management. - Pt is currently on Aciclovir Ceftazidime and Vancomycin - Lactic acid level WNL currently - Gram - rod on blood culture  sensitivity pending.  Chest pain concerning for unstable angina.  He has no known plasmacytomas in the region.  DDx includes perimyocarditis give mildly elevated troponins.  Given calcifications on CT and longstanding HTN, unstable angina is concerning.  He had neg cath 4 years ago and negative myoview one year ago.  -  Telemetry with cardiology on board and assisting with management.  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  He started bendamustine and pomalidomide on 08/29/2014.  -  Agree with holding chemotherapy pending further investigation for infection -  Dr. SBenay Spicefollowing currently  HTN, blood pressure mildly elevated  Pancytopenia due to MM and chemotherapy -  WBC steady with mild increase on last check - oncology on board.    Hyponatremia may be due to poor oral solute intake -  Rehydrate gently - reassess next am.  Acute urinary retention, foley catheter placed  Diet:  NPO Access:  PIV IVF:  resume Proph: heparin  Code Status: full Family Communication: patient  Disposition Plan: stepdown unit   Consultants:  Cardiology  Procedures:  CT angio chest  Antibiotics:  vanco 6/21 >   Zosyn 6/21 x 1  Ceftazidime 6/21 >   HPI/Subjective: Pt has no new complaints currently  Objective: Filed Vitals:   09/03/14 0800 09/03/14 0900 09/03/14 1100 09/03/14 1200  BP: 93/40 117/61 132/56   Pulse:  Temp: 98 F (36.7 C) 98.7 F (37.1 C)  103.1 F (39.5 C)  TempSrc: Oral Oral  Oral  Resp: 20 18 22    Height:      Weight:      SpO2: 100% 100% 85%     Intake/Output Summary (Last 24 hours) at 09/03/14 1301 Last data filed at 09/03/14 1200  Gross per 24 hour  Intake   3350 ml  Output   4902 ml  Net   -1552 ml   Filed Weights   09/01/14 1902 09/02/14 1300 09/03/14 0400  Weight: 99.791 kg (220 lb) 102.5 kg (225 lb 15.5 oz) 99 kg (218 lb 4.1 oz)    Exam: Initial:    General:  Pt alert and awake, in NAD  HEENT:  NCAT, MMM  Cardiovascular:  RRR, nl S1, S2 no mrg, 2+ pulses, warm extremities  Respiratory:  CTAB  Abdomen:   NABS, soft, NT/ND  MSK:   Normal tone and bulk, no LEE  Neuro:  Answers question appropriately no facial asymmetry, moves extremities equally   Data Reviewed: Basic Metabolic Panel:  Recent Labs Lab 09/01/14 1359 09/02/14 0810 09/03/14 0430  NA 137 133* 133*  K 4.4 3.6 3.2*  CL 104 98* 102  CO2 24 24 22   GLUCOSE 122* 120* 170*  BUN 17 18 21*  CREATININE 1.26* 1.13 1.56*  CALCIUM 7.6* 7.7* 6.7*  MG  --   --  1.4*   Liver Function Tests:  Recent Labs Lab 09/02/14 0810 09/03/14 0430  AST 17 35  ALT 39 45  ALKPHOS 78 86  BILITOT 0.8 1.0  PROT 9.1* 7.8  ALBUMIN 2.8* 2.3*   No results for input(s): LIPASE, AMYLASE in the last 168 hours.  Recent Labs Lab 09/03/14 0510  AMMONIA 44*   CBC:  Recent Labs Lab 09/01/14 1359 09/02/14 0810 09/03/14 0430  WBC 2.4* 1.8* 1.9*  NEUTROABS  --  1.3* 1.5*  HGB 9.3* 9.0* 8.1*  HCT 28.7* 28.2* 25.0*  MCV 89.4 88.1 88.3  PLT 109* 105* 92*   Cardiac Enzymes:  Recent Labs Lab 09/01/14 2005 09/01/14 2300 09/02/14 0134  TROPONINI 0.08* 0.09* 0.09*   BNP (last 3 results)  Recent Labs  09/01/14 1359  BNP 452.2*    ProBNP (last 3 results) No results for input(s): PROBNP in the last 8760 hours.  CBG: No results for input(s): GLUCAP in the last 168 hours.  Recent Results (from the past 240 hour(s))  Culture, blood (routine x 2)     Status: None (Preliminary result)   Collection Time: 09/02/14  8:15 AM  Result Value Ref Range Status   Specimen Description BLOOD RIGHT HAND  Final   Special Requests BOTTLES DRAWN AEROBIC AND ANAEROBIC 5CC  Final   Culture  Setup Time   Final     GRAM NEGATIVE RODS ANAEROBIC BOTTLE ONLY CRITICAL RESULT CALLED TO, READ BACK BY AND VERIFIED WITH: Nehemiah Settle RN @ [phone] ON [id number] BY Rhea Bleacher Performed at [facility]    Culture PENDING  Incomplete   Report Status PENDING  Incomplete  MRSA PCR Screening     Status: None   Collection Time: 09/02/14  3:35 PM  Result Value Ref Range Status   MRSA by PCR NEGATIVE NEGATIVE Final    Comment:        The GeneXpert MRSA Assay (FDA approved for NASAL specimens only), is one component of a comprehensive MRSA colonization surveillance program. It is not intended to diagnose MRSA infection  nor to guide or monitor treatment for MRSA infections.   Fungus Culture with Smear     Status: None (Preliminary result)   Collection Time: 09/02/14  5:20 PM  Result Value Ref Range Status   Specimen Description CSF  Final   Special Requests Immunocompromised  Final   Fungal Smear   Final    NO YEAST OR FUNGAL ELEMENTS SEEN Performed at [facility]    Culture   Final    CULTURE IN PROGRESS FOR FOUR WEEKS Performed at [facility]    Report Status PENDING  Incomplete  Gram stain     Status: None   Collection Time: 09/02/14  5:20 PM  Result Value Ref Range Status   Specimen Description CSF  Final   Special Requests NONE  Final   Gram Stain   Final    NO ORGANISMS SEEN NO WBC SEEN CYTOSPUN SMEAR Gram Stain Report Called to,Read Back By and Verified With: PAM WEST,RN [id number] @ 1924 BY J SCOTTON    Report Status 09/02/2014 FINAL  Final     Studies: Dg Chest 2 View  09/01/2014   CLINICAL DATA:  Shortness of breath and chest pain for 2 days  EXAM: CHEST - 2 VIEW  COMPARISON:  08/08/2014  FINDINGS: Cardiac shadow is at the upper limits of normal in size but stable. The lungs are well aerated bilaterally. No focal infiltrate or sizable effusion is seen. A dual-lumen chest wall port is noted on the right stable from the prior exam. No acute bony abnormality is seen per   IMPRESSION: No active disease.   Electronically Signed   By: Inez Catalina M.D.   On: 09/01/2014 15:38   Ct Head Wo Contrast  09/02/2014   CLINICAL DATA:  Lethargy. Right arm drift. Somnolence and confusion  EXAM: CT HEAD WITHOUT CONTRAST  TECHNIQUE: Contiguous axial images were obtained from the base of the skull through the vertex without intravenous contrast.  COMPARISON:  None.  FINDINGS: Mild prominence of the sulci and ventricles noted consistent with brain atrophy. Subtle low attenuation within the subcortical and periventricular white matter noted. There is no abnormal extra-axial fluid collection, intracranial hemorrhage or infarct. There is mild mucosal thickening involving the left maxillary sinus. Retention cysts versus polyps noted within both maxillary sinuses. The mastoid air cells are clear. The calvarium is intact.  IMPRESSION: 1. No acute intracranial abnormalities.   Electronically Signed   By: Kerby Moors M.D.   On: 09/02/2014 15:03   Ct Chest Wo Contrast  09/01/2014   CLINICAL DATA:  Dull generalized chest pain, shortness of breath, diaphoresis starting around 0730 hours. History of myeloma. Ongoing chemotherapy treatment.  EXAM: CT CHEST WITHOUT CONTRAST  TECHNIQUE: Multidetector CT imaging of the chest was performed following the standard protocol without IV contrast.  COMPARISON:  08/05/2014  FINDINGS: CT CHEST FINDINGS  Mediastinum/Nodes: Heart is normal size. Coronary artery calcifications in the 3 major coronary vessels. Aortic calcifications. No aneurysm. Small scattered mediastinal lymph nodes, none pathologically enlarged. No axillary or visible hilar adenopathy.  Lungs/Pleura: Lungs are clear. No focal airspace opacities or suspicious nodules. No effusions.  Chest wall: Right chest wall Port-A-Cath noted in place. The tip is in the SVC. Mild bilateral gynecomastia. Chest wall soft tissues otherwise unremarkable.  Upper abdomen: Mild diffuse fatty infiltration of the liver. No  acute findings in the visualized upper abdomen.  Musculoskeletal: No acute bony abnormality.  IMPRESSION: Coronary artery disease.  Fatty infiltration of the liver.  No acute findings.   Electronically Signed   By: Rolm Baptise M.D.   On: 09/01/2014 17:29   Ct Angio Chest Pe W/cm &/or Wo Cm  09/02/2014   CLINICAL DATA:  64 year old male with sudden onset confusion, altered mental status, diaphoresis, lethargy. Multiple myeloma, chemotherapy underway. Initial encounter.  EXAM: CT ANGIOGRAPHY CHEST WITH CONTRAST  TECHNIQUE: Multidetector CT imaging of the chest was performed using the standard protocol during bolus administration of intravenous contrast. Multiplanar CT image reconstructions and MIPs were obtained to evaluate the vascular anatomy.  CONTRAST:  133m OMNIPAQUE IOHEXOL 350 MG/ML SOLN  COMPARISON:  Noncontrast chest CT from yesterday, chest CTA 08/05/2014, and earlier  FINDINGS: Suboptimal contrast bolus timing in the pulmonary arterial tree. Increased respiratory motion artifact compared to the 08/05/2014 exam. The central pulmonary arteries remain patent. No hilar pulmonary artery filling defect identified. No lobar segmental filling defect identified. The more distal branches are not well evaluated today.  Trace bilateral pleural effusions are new since yesterday. Dependent and right middle lobe atelectasis. Major airways remain patent. No consolidation.  Cardiomegaly. No pericardial effusion. Stable mediastinal nodes. Aortic and Coronary artery calcified atherosclerosis. No thoracic aortic dissection or aneurysm. Right chest porta cath. No axillary lymphadenopathy.  Hepatic steatosis, visible spleen, and bowel in the upper abdomen are stable.  Abnormal bone mineralization throughout the visible skeleton is stable since May. No acute fracture or dislocation identified in the thorax.  Review of the MIP images confirms the above findings.  IMPRESSION: 1. Suboptimal exam with regard to distal pulmonary  arteries, no acute pulmonary embolus identified. 2. Trace pleural effusions are new since yesterday. Pulmonary atelectasis. 3. No other acute findings in the chest. Diffuse myelomatous skeletal involvement.   Electronically Signed   By: HGenevie AnnM.D.   On: 09/02/2014 15:09   Mr BJeri CosWJKContrast  09/02/2014   CLINICAL DATA:  Patient has myeloma. Shortness of breath, dizziness, and diaphoresis beginning earlier today. Fever of unknown origin.  EXAM: MRI HEAD WITHOUT AND WITH CONTRAST  TECHNIQUE: Multiplanar, multiecho pulse sequences of the brain and surrounding structures were obtained without and with intravenous contrast.  CONTRAST:  261mMULTIHANCE GADOBENATE DIMEGLUMINE 529 MG/ML IV SOLN  COMPARISON:  CT head earlier today.  FINDINGS: Equipment malfunction necessitated using an older model head coil. Furthermore patient motion is present. Image quality is reduced.  No evidence for acute infarction, hemorrhage, mass lesion, hydrocephalus, or extra-axial fluid. Mild cerebral and cerebellar atrophy. Mild subcortical and periventricular T2 and FLAIR hyperintensities, likely chronic microvascular ischemic change. Flow voids are maintained throughout the carotid, basilar, and vertebral arteries. There are no areas of chronic hemorrhage.  Post infusion, no abnormal enhancement of the brain or meninges. Chronic LEFT maxillary sinus disease.  Heterogeneity of the skull base, and visualized portions of C2 and C3 vertebrae, also patchy heterogeneity of the diploic space, without expansile lesion, could represent myeloma involvement. No concern for pathologic fracture of the upper cervical vertebrae in those segments which are visualized.  IMPRESSION: Mild atrophy and small vessel disease. No acute intracranial findings.  No abnormal postcontrast enhancement of the brain or meninges. Specifically no evidence for cerebritis or meningitis.  Heterogeneity of the skull base and upper cervical region, less so the diploic  space, but concern for bone marrow involvement with myeloma.   Electronically Signed   By: JoStaci Righter.D.   On: 09/02/2014 20:11   Dg Chest Port 1 View  09/02/2014   CLINICAL DATA:  Shortness of Breath  EXAM: PORTABLE CHEST - 1 VIEW  COMPARISON:  Chest radiograph and chest CT September 01, 2014  FINDINGS: There is generalized interstitial edema. There is no airspace consolidation. Heart is borderline enlarged with pulmonary venous hypertension. Port-A-Cath tip is in superior vena cava. No pneumothorax. No adenopathy.  IMPRESSION: Evidence of congestive heart failure.   Electronically Signed   By: Lowella Grip III M.D.   On: 09/02/2014 12:25    Scheduled Meds: .  stroke: mapping our early stages of recovery book   Does not apply Once  . acyclovir  775 mg Intravenous 3 times per day  . allopurinol  300 mg Oral Daily  . antiseptic oral rinse  7 mL Mouth Rinse BID  . aspirin  81 mg Oral Daily  . atorvastatin  80 mg Oral q1800  . carvedilol  6.25 mg Oral BID WC  . cefTAZidime (FORTAZ)  IV  2 g Intravenous 3 times per day  . feeding supplement (PRO-STAT 64)  30 mL Oral BID BM  . [START ON 09/05/2014] fentaNYL  50 mcg Transdermal Q72H  . heparin subcutaneous  5,000 Units Subcutaneous 3 times per day  . irbesartan  150 mg Oral Daily  . pantoprazole  80 mg Oral Q1200  . potassium chloride  10 mEq Oral Daily  . pregabalin  75 mg Oral BID  . sodium chloride  3 mL Intravenous Q12H  . vancomycin  1,250 mg Intravenous Q12H   Continuous Infusions: . sodium chloride 50 mL/hr at 09/03/14 1000    Principal Problem:   Chest pain Active Problems:   Multiple myeloma   Essential hypertension   CKD (chronic kidney disease) stage 3, GFR 30-59 ml/min   GERD (gastroesophageal reflux disease)   Abdominal pain, chronic, epigastric   Cancer   FUO (fever of unknown origin)   Lethargy   Tachycardia   Neutropenic fever   Screen for STD (sexually transmitted disease)   Aspergillus pneumonia    Time  spent: 35 min    Yuta Cipollone, [place] [facility] Pager [phone] If 7PM-7AM, please contact night-coverage at [email or web address], password [facility] 09/03/2014, 1:01 PM  LOS: 2 days

## 2014-09-03 NOTE — Progress Notes (Signed)
CRITICAL VALUE ALERT  Critical value received:  Lactic acid 2.4  Date of notification:  09/02/14  Time of notification:  0100  Critical value read back:Yes.    Nurse who received alert:  Leola Brazil  MD notified (1st page):  Noe Gens, [facility] NP  Time of first page:  0103  MD notified (2nd page):  Time of second page:  Responding MD:  Forrest Moron  Time MD responded: [phone]

## 2014-09-03 NOTE — Progress Notes (Signed)
ANTIBIOTIC CONSULT NOTE - INITIAL  Pharmacy Consult for ceftazidime/flagyl Indication: Intra-abdominal infection   Allergies  Allergen Reactions  . Penicillins Hives  . Zosyn [Piperacillin Sod-Tazobactam So] Hives    Patient Measurements: Height: 6' (182.9 cm) Weight: 218 lb 4.1 oz (99 kg) IBW/kg (Calculated) : 77.6 Adjusted Body Weight:   Vital Signs: Temp: 102.7 F (39.3 C) (06/22 2000) Temp Source: Axillary (06/22 2000) BP: 115/49 mmHg (06/22 2000) Intake/Output from previous day: 06/21 0701 - 06/22 0700 In: 3125 [P.O.:360; I.V.:565; IV Piggyback:1700] Out: 4502 [Urine:4501; Stool:1] Intake/Output from this shift: Total I/O In: 100 [I.V.:100] Out: 700 [Urine:700]  Labs:  Recent Labs  09/01/14 1359 09/02/14 0810 09/03/14 0430  WBC 2.4* 1.8* 1.9*  HGB 9.3* 9.0* 8.1*  PLT 109* 105* 92*  CREATININE 1.26* 1.13 1.56*   Estimated Creatinine Clearance: 59.9 mL/min (by C-G formula based on Cr of 1.56). No results for input(s): VANCOTROUGH, VANCOPEAK, VANCORANDOM, GENTTROUGH, GENTPEAK, GENTRANDOM, TOBRATROUGH, TOBRAPEAK, TOBRARND, AMIKACINPEAK, AMIKACINTROU, AMIKACIN in the last 72 hours.   Microbiology: Recent Results (from the past 720 hour(s))  Culture, blood (routine x 2)     Status: None   Collection Time: 08/06/14  1:00 AM  Result Value Ref Range Status   Specimen Description BLOOD LAC  Final   Special Requests BOTTLES DRAWN AEROBIC AND ANAEROBIC 5ML  Final   Culture   Final    NO GROWTH 5 DAYS Performed at [facility]    Report Status 08/12/2014 FINAL  Final  Culture, blood (routine x 2)     Status: None   Collection Time: 08/06/14  1:15 AM  Result Value Ref Range Status   Specimen Description BLOOD RAC  Final   Special Requests BOTTLES DRAWN AEROBIC AND ANAEROBIC 5CC  Final   Culture   Final    NO GROWTH 5 DAYS Performed at [facility]    Report Status 08/12/2014 FINAL  Final  Culture, blood (routine x 2)     Status: None  (Preliminary result)   Collection Time: 09/02/14  8:10 AM  Result Value Ref Range Status   Specimen Description BLOOD RIGHT ARM  Final   Special Requests BOTTLES DRAWN AEROBIC AND ANAEROBIC 10CC  Final   Culture   Final    NO GROWTH 1 DAY Performed at [facility]    Report Status PENDING  Incomplete  Culture, blood (routine x 2)     Status: None (Preliminary result)   Collection Time: 09/02/14  8:15 AM  Result Value Ref Range Status   Specimen Description BLOOD RIGHT HAND  Final   Special Requests BOTTLES DRAWN AEROBIC AND ANAEROBIC 5CC  Final   Culture  Setup Time   Final    GRAM NEGATIVE RODS ANAEROBIC BOTTLE ONLY CRITICAL RESULT CALLED TO, READ BACK BY AND VERIFIED WITH: Nehemiah Settle RN @ [phone] ON [id number] BY Rhea Bleacher Performed at [facility]    Culture PENDING  Incomplete   Report Status PENDING  Incomplete  MRSA PCR Screening     Status: None   Collection Time: 09/02/14  3:35 PM  Result Value Ref Range Status   MRSA by PCR NEGATIVE NEGATIVE Final    Comment:        The GeneXpert MRSA Assay (FDA approved for NASAL specimens only), is one component of a comprehensive MRSA colonization surveillance program. It is not intended to diagnose MRSA infection nor to guide or monitor treatment for MRSA infections.   CSF culture with Stat gram stain  Status: None (Preliminary result)   Collection Time: 09/02/14  5:20 PM  Result Value Ref Range Status   Specimen Description CSF  Final   Special Requests Immunocompromised  Final   Gram Stain   Final    CYTOSPIN SMEAR NO WBC SEEN NO ORGANISMS SEEN Gram Stain Report Called to,Read Back By and Verified With: PAM Margarito Courser [id number] @ 1924 BY J SCOTTON    Culture   Final    NO GROWTH < 24 HOURS Performed at [facility]    Report Status PENDING  Incomplete  AFB culture with smear     Status: None (Preliminary result)   Collection Time: 09/02/14  5:20 PM  Result Value Ref Range Status   Specimen  Description CSF  Final   Special Requests Immunocompromised  Final   Acid Fast Smear   Final    NO ACID FAST BACILLI SEEN Performed at [facility]    Culture   Final    CULTURE WILL BE EXAMINED FOR 6 WEEKS BEFORE ISSUING A FINAL REPORT Performed at [facility]    Report Status PENDING  Incomplete  Fungus Culture with Smear     Status: None (Preliminary result)   Collection Time: 09/02/14  5:20 PM  Result Value Ref Range Status   Specimen Description CSF  Final   Special Requests Immunocompromised  Final   Fungal Smear   Final    NO YEAST OR FUNGAL ELEMENTS SEEN Performed at [facility]    Culture   Final    CULTURE IN PROGRESS FOR FOUR WEEKS Performed at [facility]    Report Status PENDING  Incomplete  Gram stain     Status: None   Collection Time: 09/02/14  5:20 PM  Result Value Ref Range Status   Specimen Description CSF  Final   Special Requests NONE  Final   Gram Stain   Final    NO ORGANISMS SEEN NO WBC SEEN CYTOSPUN SMEAR Gram Stain Report Called to,Read Back By and Verified With: PAM WEST,RN [id number] @ 1924 BY J SCOTTON    Report Status 09/02/2014 FINAL  Final    Medical History: Past Medical History  Diagnosis Date  . Hypertension   . Cancer     Myeloma    Medications:  Anti-infectives    Start     Dose/Rate Route Frequency Ordered Stop   09/03/14 2330  cefTAZidime (FORTAZ) 2 g in dextrose 5 % 50 mL IVPB     2 g 100 mL/hr over 30 Minutes Intravenous 3 times per day 09/03/14 2324     09/03/14 2330  metroNIDAZOLE (FLAGYL) IVPB 500 mg     500 mg 100 mL/hr over 60 Minutes Intravenous Every 8 hours 09/03/14 2324     09/03/14 2200  piperacillin-tazobactam (ZOSYN) IVPB 3.375 g  Status:  Discontinued     3.375 g 12.5 mL/hr over 240 Minutes Intravenous 3 times per day 09/03/14 1801 09/03/14 2303   09/03/14 1500  metroNIDAZOLE (FLAGYL) tablet 500 mg  Status:  Discontinued     500 mg Oral 3 times per day 09/03/14 1423  09/03/14 1754   09/02/14 2000  vancomycin (VANCOCIN) 1,250 mg in sodium chloride 0.9 % 250 mL IVPB  Status:  Discontinued     1,250 mg 166.7 mL/hr over 90 Minutes Intravenous Every 12 hours 09/02/14 0850 09/03/14 1754   09/02/14 1930  acyclovir (ZOVIRAX) 775 mg in dextrose 5 % 150 mL IVPB  Status:  Discontinued  775 mg 165.5 mL/hr over 60 Minutes Intravenous 3 times per day 09/02/14 1901 09/03/14 1754   09/02/14 1400  piperacillin-tazobactam (ZOSYN) IVPB 3.375 g  Status:  Discontinued     3.375 g 12.5 mL/hr over 240 Minutes Intravenous 3 times per day 09/02/14 0749 09/02/14 1209   09/02/14 1400  cefTAZidime (FORTAZ) 2 g in dextrose 5 % 50 mL IVPB  Status:  Discontinued     2 g 100 mL/hr over 30 Minutes Intravenous 3 times per day 09/02/14 1210 09/03/14 1754   09/02/14 0800  vancomycin (VANCOCIN) 1,250 mg in sodium chloride 0.9 % 250 mL IVPB     1,250 mg 166.7 mL/hr over 90 Minutes Intravenous STAT 09/02/14 0752 09/02/14 0930   09/02/14 0745  piperacillin-tazobactam (ZOSYN) IVPB 3.375 g     3.375 g 100 mL/hr over 30 Minutes Intravenous  Once 09/02/14 0737 09/02/14 0815   09/02/14 0745  vancomycin (VANCOCIN) IVPB 1000 mg/200 mL premix  Status:  Discontinued     1,000 mg 200 mL/hr over 60 Minutes Intravenous  Once 09/02/14 0737 09/02/14 0746     Assessment: Patient was recently narrowed to zosyn per ID rec for intra-abdominal infection.  However, patient with new hives after zosyn.  No prior penicillin allergy noted.  ID reconsulted and desired ceftazidime/flagyl, which the NP wished for pharmacy to dose.  Goal of Therapy:  Ceftazidime/flagyl dosed based on patient weight and renal function   Plan:  Follow up culture results  Ceftazidime 2gm iv q8hr Flagyl 500mg  iv q8hr  Tyler Deis, Shea Stakes Crowford 09/03/2014,11:29 PM

## 2014-09-03 NOTE — Progress Notes (Signed)
ANTIBIOTIC CONSULT NOTE - INITIAL  Pharmacy Consult for Zosyn Indication: Intra-abdominal infection  No Known Allergies  Patient Measurements: Height: 6' (182.9 cm) Weight: 218 lb 4.1 oz (99 kg) IBW/kg (Calculated) : 77.6 Adjusted Body Weight:   Vital Signs: Temp: 99.5 F (37.5 C) (06/22 1600) Temp Source: Oral (06/22 1600) BP: 108/45 mmHg (06/22 1700) Intake/Output from previous day: 06/21 0701 - 06/22 0700 In: 3125 [P.O.:360; I.V.:565; IV Piggyback:1700] Out: 4502 [Urine:4501; Stool:1] Intake/Output from this shift: Total I/O In: 225 [I.V.:225] Out: 950 [Urine:950]  Labs:  Recent Labs  09/01/14 1359 09/02/14 0810 09/03/14 0430  WBC 2.4* 1.8* 1.9*  HGB 9.3* 9.0* 8.1*  PLT 109* 105* 92*  CREATININE 1.26* 1.13 1.56*   Estimated Creatinine Clearance: 59.9 mL/min (by C-G formula based on Cr of 1.56). No results for input(s): VANCOTROUGH, VANCOPEAK, VANCORANDOM, GENTTROUGH, GENTPEAK, GENTRANDOM, TOBRATROUGH, TOBRAPEAK, TOBRARND, AMIKACINPEAK, AMIKACINTROU, AMIKACIN in the last 72 hours.   Microbiology: Recent Results (from the past 720 hour(s))  Culture, blood (routine x 2)     Status: None   Collection Time: 08/06/14  1:00 AM  Result Value Ref Range Status   Specimen Description BLOOD LAC  Final   Special Requests BOTTLES DRAWN AEROBIC AND ANAEROBIC 5ML  Final   Culture   Final    NO GROWTH 5 DAYS Performed at [facility]    Report Status 08/12/2014 FINAL  Final  Culture, blood (routine x 2)     Status: None   Collection Time: 08/06/14  1:15 AM  Result Value Ref Range Status   Specimen Description BLOOD RAC  Final   Special Requests BOTTLES DRAWN AEROBIC AND ANAEROBIC 5CC  Final   Culture   Final    NO GROWTH 5 DAYS Performed at [facility]    Report Status 08/12/2014 FINAL  Final  Culture, blood (routine x 2)     Status: None (Preliminary result)   Collection Time: 09/02/14  8:10 AM  Result Value Ref Range Status   Specimen  Description BLOOD RIGHT ARM  Final   Special Requests BOTTLES DRAWN AEROBIC AND ANAEROBIC 10CC  Final   Culture   Final    NO GROWTH 1 DAY Performed at [facility]    Report Status PENDING  Incomplete  Culture, blood (routine x 2)     Status: None (Preliminary result)   Collection Time: 09/02/14  8:15 AM  Result Value Ref Range Status   Specimen Description BLOOD RIGHT HAND  Final   Special Requests BOTTLES DRAWN AEROBIC AND ANAEROBIC 5CC  Final   Culture  Setup Time   Final    GRAM NEGATIVE RODS ANAEROBIC BOTTLE ONLY CRITICAL RESULT CALLED TO, READ BACK BY AND VERIFIED WITH: Nehemiah Settle RN @ [phone] ON [id number] BY Rhea Bleacher Performed at [facility]    Culture PENDING  Incomplete   Report Status PENDING  Incomplete  MRSA PCR Screening     Status: None   Collection Time: 09/02/14  3:35 PM  Result Value Ref Range Status   MRSA by PCR NEGATIVE NEGATIVE Final    Comment:        The GeneXpert MRSA Assay (FDA approved for NASAL specimens only), is one component of a comprehensive MRSA colonization surveillance program. It is not intended to diagnose MRSA infection nor to guide or monitor treatment for MRSA infections.   CSF culture with Stat gram stain     Status: None (Preliminary result)   Collection Time: 09/02/14  5:20 PM  Result Value Ref Range Status   Specimen Description CSF  Final   Special Requests Immunocompromised  Final   Gram Stain   Final    CYTOSPIN SMEAR NO WBC SEEN NO ORGANISMS SEEN Gram Stain Report Called to,Read Back By and Verified With: PAM Margarito Courser [id number] @ 1924 BY J SCOTTON    Culture   Final    NO GROWTH < 24 HOURS Performed at [facility]    Report Status PENDING  Incomplete  AFB culture with smear     Status: None (Preliminary result)   Collection Time: 09/02/14  5:20 PM  Result Value Ref Range Status   Specimen Description CSF  Final   Special Requests Immunocompromised  Final   Acid Fast Smear   Final    NO ACID  FAST BACILLI SEEN Performed at [facility]    Culture   Final    CULTURE WILL BE EXAMINED FOR 6 WEEKS BEFORE ISSUING A FINAL REPORT Performed at [facility]    Report Status PENDING  Incomplete  Fungus Culture with Smear     Status: None (Preliminary result)   Collection Time: 09/02/14  5:20 PM  Result Value Ref Range Status   Specimen Description CSF  Final   Special Requests Immunocompromised  Final   Fungal Smear   Final    NO YEAST OR FUNGAL ELEMENTS SEEN Performed at [facility]    Culture   Final    CULTURE IN PROGRESS FOR FOUR WEEKS Performed at [facility]    Report Status PENDING  Incomplete  Gram stain     Status: None   Collection Time: 09/02/14  5:20 PM  Result Value Ref Range Status   Specimen Description CSF  Final   Special Requests NONE  Final   Gram Stain   Final    NO ORGANISMS SEEN NO WBC SEEN CYTOSPUN SMEAR Gram Stain Report Called to,Read Back By and Verified With: PAM WEST,RN [id number] @ 1924 BY J SCOTTON    Report Status 09/02/2014 FINAL  Final    Medical History: Past Medical History  Diagnosis Date  . Hypertension   . Cancer     Myeloma   Assessment: 63 yoM with multiple myeloma with pancytopenia and now FUO with HA, confusion, and ACS.  ID following and suspects intra-abdominal source given culture results, but would also like to rule out intra-abdominal abscess.  Antibiotics narrowed to Zosyn.    6/21 >> Vancomycin >> 6/21 >> Zosyn >> 6/21 6/22 >> 6/21 >> Ceftazidime >> 6/21 >> Acyclovir >>  Temp: Regular temps to 103 WBC: low (last ANC 1500) Renal: SCr 1.56; CrCl 60 CG  6/21 blood x 2: 1/2 GNR on GS (only GS resulted yet) 6/21 CSF - Gm stain clear; culture pending 6/21 CSF Fungal: Gm stain clear; Cx pending 6/21 CSF AFB: IP HIV: IP HepC: IP Cryptococcal Ag: neg HSV PCR: IP Histoplasma Ag: IP  Dose changes/levels: 6/21: Zosyn chg to Ceftazidime 2/2 concern for worsening cytopenias 6/23: 0700  VT = xx on 1250 mg q12   Goal of Therapy:  Eradication of infection  Plan:  Zosyn 3.375g IV q8h (infuse over 4 hours) F/u renal fxn, cultures, and clinical course   Ralene Bathe, PharmD, BCPS 09/03/2014, 6:06 PM  Pager: [phone]

## 2014-09-03 NOTE — Progress Notes (Signed)
[place] [facility]    Subjective: Has had more episodes of confusion today but every time it is happening with fevers   Antibiotics:  Anti-infectives    Start     Dose/Rate Route Frequency Ordered Stop   09/03/14 1500  metroNIDAZOLE (FLAGYL) tablet 500 mg     500 mg Oral 3 times per day 09/03/14 1423     09/02/14 2000  vancomycin (VANCOCIN) 1,250 mg in sodium chloride 0.9 % 250 mL IVPB     1,250 mg 166.7 mL/hr over 90 Minutes Intravenous Every 12 hours 09/02/14 0850     09/02/14 1930  acyclovir (ZOVIRAX) 775 mg in dextrose 5 % 150 mL IVPB     775 mg 165.5 mL/hr over 60 Minutes Intravenous 3 times per day 09/02/14 1901     09/02/14 1400  piperacillin-tazobactam (ZOSYN) IVPB 3.375 g  Status:  Discontinued     3.375 g 12.5 mL/hr over 240 Minutes Intravenous 3 times per day 09/02/14 0749 09/02/14 1209   09/02/14 1400  cefTAZidime (FORTAZ) 2 g in dextrose 5 % 50 mL IVPB     2 g 100 mL/hr over 30 Minutes Intravenous 3 times per day 09/02/14 1210     09/02/14 0800  vancomycin (VANCOCIN) 1,250 mg in sodium chloride 0.9 % 250 mL IVPB     1,250 mg 166.7 mL/hr over 90 Minutes Intravenous STAT 09/02/14 0752 09/02/14 0930   09/02/14 0745  piperacillin-tazobactam (ZOSYN) IVPB 3.375 g     3.375 g 100 mL/hr over 30 Minutes Intravenous  Once 09/02/14 0737 09/02/14 0815   09/02/14 0745  vancomycin (VANCOCIN) IVPB 1000 mg/200 mL premix  Status:  Discontinued     1,000 mg 200 mL/hr over 60 Minutes Intravenous  Once 09/02/14 0737 09/02/14 0746      Medications: Scheduled Meds: .  stroke: mapping our early stages of recovery book   Does not apply Once  . acyclovir  775 mg Intravenous 3 times per day  . allopurinol  300 mg Oral Daily  . antiseptic oral rinse  7 mL Mouth Rinse BID  . aspirin  81 mg Oral Daily  . atorvastatin  80 mg Oral q1800  . carvedilol  6.25 mg Oral BID WC  . cefTAZidime (FORTAZ)  IV  2 g Intravenous 3 times per day  . feeding supplement  (PRO-STAT 64)  30 mL Oral BID BM  . [START ON 09/05/2014] fentaNYL  50 mcg Transdermal Q72H  . heparin subcutaneous  5,000 Units Subcutaneous 3 times per day  . irbesartan  150 mg Oral Daily  . metroNIDAZOLE  500 mg Oral 3 times per day  . pantoprazole  80 mg Oral Q1200  . potassium chloride  10 mEq Oral Daily  . pregabalin  75 mg Oral BID  . sodium chloride  3 mL Intravenous Q12H  . vancomycin  1,250 mg Intravenous Q12H   Continuous Infusions: . sodium chloride 50 mL/hr at 09/03/14 1000   PRN Meds:.sodium chloride, [DISCONTINUED] acetaminophen **OR** acetaminophen, acetaminophen, gi cocktail, ibuprofen, ondansetron (ZOFRAN) IV, oxyCODONE, sodium chloride, sodium chloride    Objective: Weight change: 5 lb 15.5 oz (2.709 kg)  Intake/Output Summary (Last 24 hours) at 09/03/14 1556 Last data filed at 09/03/14 1200  Gross per 24 hour  Intake   3280 ml  Output   3000 ml  Net    280 ml   Blood pressure 132/56, pulse 108, temperature 99.3 F (37.4 C), temperature  source Oral, resp. rate 22, height 6' (1.829 m), weight 218 lb 4.1 oz (99 kg), SpO2 85 %. Temp:  [98 F (36.7 C)-104.5 F (40.3 C)] 99.3 F (37.4 C) (06/22 1440) Pulse Rate:  [101-108] 108 (06/21 1731) Resp:  [16-30] 22 (06/22 1100) BP: (88-155)/(33-133) 132/56 mmHg (06/22 1100) SpO2:  [85 %-100 %] 85 % (06/22 1100) Weight:  [218 lb 4.1 oz (99 kg)-220 lb (99.791 kg)] 218 lb 4.1 oz (99 kg) (06/22 0400)  Physical Exam: General: Alert and awake, oriented x3, not in any acute distress but slightly flushed HEENT: anicteric sclera, pupils reactive to light and accommodation, EOMI, oropharynx clear and without exudate CVS tachy rate, normal r, no murmur rubs or gallops Chest: clear to auscultation bilaterally, no wheezing, rales or rhonchi Abdomen: soft nontender, nondistended, normal bowel sounds, Extremities: no clubbing or edema noted bilaterally Skin: no rashes other than flushed face, porta cath is clean Neuro:  nonfocal, strength and sensation intact  CBC: CBC Latest Ref Rng 09/03/2014 09/02/2014 09/01/2014  WBC 4.0 - 10.5 K/uL 1.9(L) 1.8(L) 2.4(L)  Hemoglobin 13.0 - 17.0 g/dL 8.1(L) 9.0(L) 9.3(L)  Hematocrit 39.0 - 52.0 % 25.0(L) 28.2(L) 28.7(L)  Platelets 150 - 400 K/uL 92(L) 105(L) 109(L)       BMET  Recent Labs  09/02/14 0810 09/03/14 0430  NA 133* 133*  K 3.6 3.2*  CL 98* 102  CO2 24 22  GLUCOSE 120* 170*  BUN 18 21*  CREATININE 1.13 1.56*  CALCIUM 7.7* 6.7*     Liver Panel   Recent Labs  09/02/14 0810 09/03/14 0430  PROT 9.1* 7.8  ALBUMIN 2.8* 2.3*  AST 17 35  ALT 39 45  ALKPHOS 78 86  BILITOT 0.8 1.0       Sedimentation Rate No results for input(s): ESRSEDRATE in the last 72 hours. C-Reactive Protein No results for input(s): CRP in the last 72 hours.  Micro Results: Recent Results (from the past 720 hour(s))  Culture, blood (routine x 2)     Status: None   Collection Time: 08/06/14  1:00 AM  Result Value Ref Range Status   Specimen Description BLOOD LAC  Final   Special Requests BOTTLES DRAWN AEROBIC AND ANAEROBIC 5ML  Final   Culture   Final    NO GROWTH 5 DAYS Performed at [facility]    Report Status 08/12/2014 FINAL  Final  Culture, blood (routine x 2)     Status: None   Collection Time: 08/06/14  1:15 AM  Result Value Ref Range Status   Specimen Description BLOOD RAC  Final   Special Requests BOTTLES DRAWN AEROBIC AND ANAEROBIC 5CC  Final   Culture   Final    NO GROWTH 5 DAYS Performed at [facility]    Report Status 08/12/2014 FINAL  Final  Culture, blood (routine x 2)     Status: None (Preliminary result)   Collection Time: 09/02/14  8:10 AM  Result Value Ref Range Status   Specimen Description BLOOD RIGHT ARM  Final   Special Requests BOTTLES DRAWN AEROBIC AND ANAEROBIC 10CC  Final   Culture   Final    NO GROWTH 1 DAY Performed at [facility]    Report Status PENDING  Incomplete  Culture, blood  (routine x 2)     Status: None (Preliminary result)   Collection Time: 09/02/14  8:15 AM  Result Value Ref Range Status   Specimen Description BLOOD RIGHT HAND  Final   Special Requests BOTTLES  DRAWN AEROBIC AND ANAEROBIC 5CC  Final   Culture  Setup Time   Final    GRAM NEGATIVE RODS ANAEROBIC BOTTLE ONLY CRITICAL RESULT CALLED TO, READ BACK BY AND VERIFIED WITH: Nehemiah Settle RN @ [phone] ON [id number] BY Rhea Bleacher Performed at  [facility]    Culture PENDING  Incomplete   Report Status PENDING  Incomplete  MRSA PCR Screening     Status: None   Collection Time: 09/02/14  3:35 PM  Result Value Ref Range Status   MRSA by PCR NEGATIVE NEGATIVE Final    Comment:        The GeneXpert MRSA Assay (FDA approved for NASAL specimens only), is one component of a comprehensive MRSA colonization surveillance program. It is not intended to diagnose MRSA infection nor to guide or monitor treatment for MRSA infections.   CSF culture with Stat gram stain     Status: None (Preliminary result)   Collection Time: 09/02/14  5:20 PM  Result Value Ref Range Status   Specimen Description CSF  Final   Special Requests Immunocompromised  Final   Gram Stain   Final    CYTOSPIN SMEAR NO WBC SEEN NO ORGANISMS SEEN Gram Stain Report Called to,Read Back By and Verified With: PAM Margarito Courser [id number] @ 1924 BY J SCOTTON    Culture   Final    NO GROWTH < 24 HOURS Performed at [facility]    Report Status PENDING  Incomplete  AFB culture with smear     Status: None (Preliminary result)   Collection Time: 09/02/14  5:20 PM  Result Value Ref Range Status   Specimen Description CSF  Final   Special Requests Immunocompromised  Final   Acid Fast Smear   Final    NO ACID FAST BACILLI SEEN Performed at [facility]    Culture   Final    CULTURE WILL BE EXAMINED FOR 6 WEEKS BEFORE ISSUING A FINAL REPORT Performed at [facility]    Report Status PENDING  Incomplete  Fungus Culture  with Smear     Status: None (Preliminary result)   Collection Time: 09/02/14  5:20 PM  Result Value Ref Range Status   Specimen Description CSF  Final   Special Requests Immunocompromised  Final   Fungal Smear   Final    NO YEAST OR FUNGAL ELEMENTS SEEN Performed at [facility]    Culture   Final    CULTURE IN PROGRESS FOR FOUR WEEKS Performed at [facility]    Report Status PENDING  Incomplete  Gram stain     Status: None   Collection Time: 09/02/14  5:20 PM  Result Value Ref Range Status   Specimen Description CSF  Final   Special Requests NONE  Final   Gram Stain   Final    NO ORGANISMS SEEN NO WBC SEEN CYTOSPUN SMEAR Gram Stain Report Called to,Read Back By and Verified With: PAM WEST,RN [id number] @ 1924 BY J SCOTTON    Report Status 09/02/2014 FINAL  Final    Studies/Results: Ct Head Wo Contrast  09/02/2014   CLINICAL DATA:  Lethargy. Right arm drift. Somnolence and confusion  EXAM: CT HEAD WITHOUT CONTRAST  TECHNIQUE: Contiguous axial images were obtained from the base of the skull through the vertex without intravenous contrast.  COMPARISON:  None.  FINDINGS: Mild prominence of the sulci and ventricles noted consistent with brain atrophy. Subtle low attenuation within the subcortical and periventricular white matter noted. There  is no abnormal extra-axial fluid collection, intracranial hemorrhage or infarct. There is mild mucosal thickening involving the left maxillary sinus. Retention cysts versus polyps noted within both maxillary sinuses. The mastoid air cells are clear. The calvarium is intact.  IMPRESSION: 1. No acute intracranial abnormalities.   Electronically Signed   By: Kerby Moors M.D.   On: 09/02/2014 15:03   Ct Chest Wo Contrast  09/01/2014   CLINICAL DATA:  Dull generalized chest pain, shortness of breath, diaphoresis starting around 0730 hours. History of myeloma. Ongoing chemotherapy treatment.  EXAM: CT CHEST WITHOUT CONTRAST  TECHNIQUE:  Multidetector CT imaging of the chest was performed following the standard protocol without IV contrast.  COMPARISON:  08/05/2014  FINDINGS: CT CHEST FINDINGS  Mediastinum/Nodes: Heart is normal size. Coronary artery calcifications in the 3 major coronary vessels. Aortic calcifications. No aneurysm. Small scattered mediastinal lymph nodes, none pathologically enlarged. No axillary or visible hilar adenopathy.  Lungs/Pleura: Lungs are clear. No focal airspace opacities or suspicious nodules. No effusions.  Chest wall: Right chest wall Port-A-Cath noted in place. The tip is in the SVC. Mild bilateral gynecomastia. Chest wall soft tissues otherwise unremarkable.  Upper abdomen: Mild diffuse fatty infiltration of the liver. No acute findings in the visualized upper abdomen.  Musculoskeletal: No acute bony abnormality.  IMPRESSION: Coronary artery disease.  Fatty infiltration of the liver.  No acute findings.   Electronically Signed   By: Rolm Baptise M.D.   On: 09/01/2014 17:29   Ct Angio Chest Pe W/cm &/or Wo Cm  09/02/2014   CLINICAL DATA:  64 year old male with sudden onset confusion, altered mental status, diaphoresis, lethargy. Multiple myeloma, chemotherapy underway. Initial encounter.  EXAM: CT ANGIOGRAPHY CHEST WITH CONTRAST  TECHNIQUE: Multidetector CT imaging of the chest was performed using the standard protocol during bolus administration of intravenous contrast. Multiplanar CT image reconstructions and MIPs were obtained to evaluate the vascular anatomy.  CONTRAST:  146m OMNIPAQUE IOHEXOL 350 MG/ML SOLN  COMPARISON:  Noncontrast chest CT from yesterday, chest CTA 08/05/2014, and earlier  FINDINGS: Suboptimal contrast bolus timing in the pulmonary arterial tree. Increased respiratory motion artifact compared to the 08/05/2014 exam. The central pulmonary arteries remain patent. No hilar pulmonary artery filling defect identified. No lobar segmental filling defect identified. The more distal branches are  not well evaluated today.  Trace bilateral pleural effusions are new since yesterday. Dependent and right middle lobe atelectasis. Major airways remain patent. No consolidation.  Cardiomegaly. No pericardial effusion. Stable mediastinal nodes. Aortic and Coronary artery calcified atherosclerosis. No thoracic aortic dissection or aneurysm. Right chest porta cath. No axillary lymphadenopathy.  Hepatic steatosis, visible spleen, and bowel in the upper abdomen are stable.  Abnormal bone mineralization throughout the visible skeleton is stable since May. No acute fracture or dislocation identified in the thorax.  Review of the MIP images confirms the above findings.  IMPRESSION: 1. Suboptimal exam with regard to distal pulmonary arteries, no acute pulmonary embolus identified. 2. Trace pleural effusions are new since yesterday. Pulmonary atelectasis. 3. No other acute findings in the chest. Diffuse myelomatous skeletal involvement.   Electronically Signed   By: HGenevie AnnM.D.   On: 09/02/2014 15:09   Mr BJeri CosWJOContrast  09/02/2014   CLINICAL DATA:  Patient has myeloma. Shortness of breath, dizziness, and diaphoresis beginning earlier today. Fever of unknown origin.  EXAM: MRI HEAD WITHOUT AND WITH CONTRAST  TECHNIQUE: Multiplanar, multiecho pulse sequences of the brain and surrounding structures were obtained without and with intravenous  contrast.  CONTRAST:  38m MULTIHANCE GADOBENATE DIMEGLUMINE 529 MG/ML IV SOLN  COMPARISON:  CT head earlier today.  FINDINGS: Equipment malfunction necessitated using an older model head coil. Furthermore patient motion is present. Image quality is reduced.  No evidence for acute infarction, hemorrhage, mass lesion, hydrocephalus, or extra-axial fluid. Mild cerebral and cerebellar atrophy. Mild subcortical and periventricular T2 and FLAIR hyperintensities, likely chronic microvascular ischemic change. Flow voids are maintained throughout the carotid, basilar, and vertebral  arteries. There are no areas of chronic hemorrhage.  Post infusion, no abnormal enhancement of the brain or meninges. Chronic LEFT maxillary sinus disease.  Heterogeneity of the skull base, and visualized portions of C2 and C3 vertebrae, also patchy heterogeneity of the diploic space, without expansile lesion, could represent myeloma involvement. No concern for pathologic fracture of the upper cervical vertebrae in those segments which are visualized.  IMPRESSION: Mild atrophy and small vessel disease. No acute intracranial findings.  No abnormal postcontrast enhancement of the brain or meninges. Specifically no evidence for cerebritis or meningitis.  Heterogeneity of the skull base and upper cervical region, less so the diploic space, but concern for bone marrow involvement with myeloma.   Electronically Signed   By: JStaci RighterM.D.   On: 09/02/2014 20:11   Dg Chest Port 1 View  09/02/2014   CLINICAL DATA:  Shortness of Breath  EXAM: PORTABLE CHEST - 1 VIEW  COMPARISON:  Chest radiograph and chest CT September 01, 2014  FINDINGS: There is generalized interstitial edema. There is no airspace consolidation. Heart is borderline enlarged with pulmonary venous hypertension. Port-A-Cath tip is in superior vena cava. No pneumothorax. No adenopathy.  IMPRESSION: Evidence of congestive heart failure.   Electronically Signed   By: WLowella GripIII M.D.   On: 09/02/2014 12:25      Assessment/Plan:  Principal Problem:   Chest pain Active Problems:   Multiple myeloma   Essential hypertension   CKD (chronic kidney disease) stage 3, GFR 30-59 ml/min   GERD (gastroesophageal reflux disease)   Abdominal pain, chronic, epigastric   Cancer   FUO (fever of unknown origin)   Lethargy   Tachycardia   Neutropenic fever   Screen for STD (sexually transmitted disease)   Aspergillus pneumonia    DDekendrick Uzelacis a 64y.o. 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 He is growing a GNR (anaerobe) from  1 admission blood culture.    #1 FUO in pancytopenic patient with Multiple Myeloma, HA, confusion, ACS. Given the anaerobic gram negative rod, I suspect an intraabdominal source from simple gut translocation in setting of neutropenia but would like to rule out intrabdominlal abscess  Will order CT tomorrow hopefully with PO and IV contrast (renal fxn today not permissive of this)  Will simplify back to zosyn to give me coverage of gram + that vancomycin getting (minus MRSA, AMp R Enterococcus) along with GNR aerobic anerobic and dc ceftaz, flagyl. Cefepime flagyl would be similar coverage but there is natioinal shortage  I will dc vancomycin since I see no clear cut need for MRSA AMP R enterococcus at present and worry about excess nephrotoxins  Similarly given MRI without temporal lobe enhancement, bland CSF will dc acyclovir (HSV PCR still cooking) , to  avoid nephrotoxicity from this drug         LOS: 2 days   Alcide Evener 09/03/2014, 3:56 PM

## 2014-09-03 NOTE — Progress Notes (Signed)
Speech Language Pathology Treatment: Dysphagia  Patient Details Name: Walter Dennis MRN: [id number] DOB: 11/29/1950 Today's Date: 09/03/2014 Time: 0938-1829 SLP Time Calculation (min) (ACUTE ONLY): 13 min  Assessment / Plan / Recommendation Clinical Impression  Pt demonstrated safe and efficient swallow function with thin and solid textures. Prior to consumption, pt's O2 sats running low (70's -80's) with waveform appearing within normal limits. Pt states he is mildly short of breath, however does not appear to have labored breathing (monitor is on ear and tape losing adherence possibly reading false). No s/s aspiration present. He is at higher risk given current illness. Recommend continue regular diet and thin liquids, small sips via straw allowed, pills with liquid. ST to sign off. Please reconsult if needed.     HPI Other Pertinent Information: 64 y.o. maleadmitted after chest pain. History of multiple myeloma, undergoing chemo. Pt developed altered mental status and transferred to ICU. PMH: HTN, cancer. Treated for Pneumonia 3 wks ago. CXR Evidence of congestive heart failure. Failed RN stroke swallow screen.   Pertinent Vitals Pain Assessment: No/denies pain  SLP Plan  Discharge SLP treatment due to (comment) (safe with po's)    Recommendations Diet recommendations: Regular;Thin liquid Liquids provided via: Cup;Straw Medication Administration: Whole meds with liquid Supervision: Patient able to self feed;Intermittent supervision to cue for compensatory strategies Compensations: Slow rate;Small sips/bites Postural Changes and/or Swallow Maneuvers: Seated upright 90 degrees              Oral Care Recommendations: Oral care BID Follow up Recommendations: None Plan: Discharge SLP treatment due to (comment) (safe with po's)    GO     Walter Dennis 09/03/2014, 11:22 AM  Walter Dennis.Ed [employer] [phone]

## 2014-09-03 NOTE — Progress Notes (Signed)
IP PROGRESS NOTE  Subjective:   Mr. Camarena developed fever and confusion. He was transferred to the ICU yesterday. He continues to have intermittent confusion with fever. He had burning discomfort at the right upper anterior chest lateral to the Port-A-Cath site yesterday. This is better today.  Objective: Vital signs in last 24 hours: Blood pressure 132/56, pulse 108, temperature 103.1 F (39.5 C), temperature source Oral, resp. rate 22, height 6' (1.829 m), weight 218 lb 4.1 oz (99 kg), SpO2 85 %.  Intake/Output from previous day: 06/21 0701 - 06/22 0700 In: 3125 [P.O.:360; I.V.:565; IV Piggyback:1700] Out: 4502 [Urine:4501; Stool:1]  Physical Exam:   Lungs: Clear anteriorly Cardiac: Regular rate and rhythm Abdomen: No hepatosplenomegaly, nontender Extremities: No leg edema   Portacath/PICC-without erythema  Lab Results:  Recent Labs  09/02/14 0810 09/03/14 0430  WBC 1.8* 1.9*  HGB 9.0* 8.1*  HCT 28.2* 25.0*  PLT 105* 92*    BMET  Recent Labs  09/02/14 0810 09/03/14 0430  NA 133* 133*  K 3.6 3.2*  CL 98* 102  CO2 24 22  GLUCOSE 120* 170*  BUN 18 21*  CREATININE 1.13 1.56*  CALCIUM 7.7* 6.7*    Studies/Results: Dg Chest 2 View  09/01/2014   CLINICAL DATA:  Shortness of breath and chest pain for 2 days  EXAM: CHEST - 2 VIEW  COMPARISON:  08/08/2014  FINDINGS: Cardiac shadow is at the upper limits of normal in size but stable. The lungs are well aerated bilaterally. No focal infiltrate or sizable effusion is seen. A dual-lumen chest wall port is noted on the right stable from the prior exam. No acute bony abnormality is seen per  IMPRESSION: No active disease.   Electronically Signed   By: Inez Catalina M.D.   On: 09/01/2014 15:38   Ct Head Wo Contrast  09/02/2014   CLINICAL DATA:  Lethargy. Right arm drift. Somnolence and confusion  EXAM: CT HEAD WITHOUT CONTRAST  TECHNIQUE: Contiguous axial images were obtained from the base of the skull through the vertex  without intravenous contrast.  COMPARISON:  None.  FINDINGS: Mild prominence of the sulci and ventricles noted consistent with brain atrophy. Subtle low attenuation within the subcortical and periventricular white matter noted. There is no abnormal extra-axial fluid collection, intracranial hemorrhage or infarct. There is mild mucosal thickening involving the left maxillary sinus. Retention cysts versus polyps noted within both maxillary sinuses. The mastoid air cells are clear. The calvarium is intact.  IMPRESSION: 1. No acute intracranial abnormalities.   Electronically Signed   By: Kerby Moors M.D.   On: 09/02/2014 15:03   Ct Chest Wo Contrast  09/01/2014   CLINICAL DATA:  Dull generalized chest pain, shortness of breath, diaphoresis starting around 0730 hours. History of myeloma. Ongoing chemotherapy treatment.  EXAM: CT CHEST WITHOUT CONTRAST  TECHNIQUE: Multidetector CT imaging of the chest was performed following the standard protocol without IV contrast.  COMPARISON:  08/05/2014  FINDINGS: CT CHEST FINDINGS  Mediastinum/Nodes: Heart is normal size. Coronary artery calcifications in the 3 major coronary vessels. Aortic calcifications. No aneurysm. Small scattered mediastinal lymph nodes, none pathologically enlarged. No axillary or visible hilar adenopathy.  Lungs/Pleura: Lungs are clear. No focal airspace opacities or suspicious nodules. No effusions.  Chest wall: Right chest wall Port-A-Cath noted in place. The tip is in the SVC. Mild bilateral gynecomastia. Chest wall soft tissues otherwise unremarkable.  Upper abdomen: Mild diffuse fatty infiltration of the liver. No acute findings in the visualized upper abdomen.  Musculoskeletal: No acute bony abnormality.  IMPRESSION: Coronary artery disease.  Fatty infiltration of the liver.  No acute findings.   Electronically Signed   By: Rolm Baptise M.D.   On: 09/01/2014 17:29   Ct Angio Chest Pe W/cm &/or Wo Cm  09/02/2014   CLINICAL DATA:  64 year old  male with sudden onset confusion, altered mental status, diaphoresis, lethargy. Multiple myeloma, chemotherapy underway. Initial encounter.  EXAM: CT ANGIOGRAPHY CHEST WITH CONTRAST  TECHNIQUE: Multidetector CT imaging of the chest was performed using the standard protocol during bolus administration of intravenous contrast. Multiplanar CT image reconstructions and MIPs were obtained to evaluate the vascular anatomy.  CONTRAST:  133m OMNIPAQUE IOHEXOL 350 MG/ML SOLN  COMPARISON:  Noncontrast chest CT from yesterday, chest CTA 08/05/2014, and earlier  FINDINGS: Suboptimal contrast bolus timing in the pulmonary arterial tree. Increased respiratory motion artifact compared to the 08/05/2014 exam. The central pulmonary arteries remain patent. No hilar pulmonary artery filling defect identified. No lobar segmental filling defect identified. The more distal branches are not well evaluated today.  Trace bilateral pleural effusions are new since yesterday. Dependent and right middle lobe atelectasis. Major airways remain patent. No consolidation.  Cardiomegaly. No pericardial effusion. Stable mediastinal nodes. Aortic and Coronary artery calcified atherosclerosis. No thoracic aortic dissection or aneurysm. Right chest porta cath. No axillary lymphadenopathy.  Hepatic steatosis, visible spleen, and bowel in the upper abdomen are stable.  Abnormal bone mineralization throughout the visible skeleton is stable since May. No acute fracture or dislocation identified in the thorax.  Review of the MIP images confirms the above findings.  IMPRESSION: 1. Suboptimal exam with regard to distal pulmonary arteries, no acute pulmonary embolus identified. 2. Trace pleural effusions are new since yesterday. Pulmonary atelectasis. 3. No other acute findings in the chest. Diffuse myelomatous skeletal involvement.   Electronically Signed   By: HGenevie AnnM.D.   On: 09/02/2014 15:09   Mr BJeri CosWXIContrast  09/02/2014   CLINICAL DATA:   Patient has myeloma. Shortness of breath, dizziness, and diaphoresis beginning earlier today. Fever of unknown origin.  EXAM: MRI HEAD WITHOUT AND WITH CONTRAST  TECHNIQUE: Multiplanar, multiecho pulse sequences of the brain and surrounding structures were obtained without and with intravenous contrast.  CONTRAST:  219mMULTIHANCE GADOBENATE DIMEGLUMINE 529 MG/ML IV SOLN  COMPARISON:  CT head earlier today.  FINDINGS: Equipment malfunction necessitated using an older model head coil. Furthermore patient motion is present. Image quality is reduced.  No evidence for acute infarction, hemorrhage, mass lesion, hydrocephalus, or extra-axial fluid. Mild cerebral and cerebellar atrophy. Mild subcortical and periventricular T2 and FLAIR hyperintensities, likely chronic microvascular ischemic change. Flow voids are maintained throughout the carotid, basilar, and vertebral arteries. There are no areas of chronic hemorrhage.  Post infusion, no abnormal enhancement of the brain or meninges. Chronic LEFT maxillary sinus disease.  Heterogeneity of the skull base, and visualized portions of C2 and C3 vertebrae, also patchy heterogeneity of the diploic space, without expansile lesion, could represent myeloma involvement. No concern for pathologic fracture of the upper cervical vertebrae in those segments which are visualized.  IMPRESSION: Mild atrophy and small vessel disease. No acute intracranial findings.  No abnormal postcontrast enhancement of the brain or meninges. Specifically no evidence for cerebritis or meningitis.  Heterogeneity of the skull base and upper cervical region, less so the diploic space, but concern for bone marrow involvement with myeloma.   Electronically Signed   By: JoStaci Righter.D.   On: 09/02/2014 20:11  Dg Chest Port 1 View  09/02/2014   CLINICAL DATA:  Shortness of Breath  EXAM: PORTABLE CHEST - 1 VIEW  COMPARISON:  Chest radiograph and chest CT September 01, 2014  FINDINGS: There is generalized  interstitial edema. There is no airspace consolidation. Heart is borderline enlarged with pulmonary venous hypertension. Port-A-Cath tip is in superior vena cava. No pneumothorax. No adenopathy.  IMPRESSION: Evidence of congestive heart failure.   Electronically Signed   By: Lowella Grip III M.D.   On: 09/02/2014 12:25    Medications: I have reviewed the patient's current medications.  Assessment/Plan:  1.Multiple myeloma, IgG lambda monoclonal protein  -Initial diagnosis 2008, bone marrow with a 40-50% plasma cells  -Lenalidomide 25 mg,day 1-21 and Decadron 40 mg weekly and 6415, complicated by peripheral neuropathy  -Therapy change to bortezomib IV twice weekly plus Decadron  -High-dose chemotherapy with autologous stem cell transplantation 2009 at [facility]  -Bortezomib, cyclophosphamide, and Decadron December 2012 through February 2014 (discontinued secondary to pulmonary aspergillosis)  -January 2014 M spike rise to 1.1 g/dL from 0.1 g/dL in December 2013. Treatment started with Carlfilzomib, lenalidomide, and Decadron.  -treatment was restarted with Carlfilzomib, lenalidomide, and Decadron 08/16/2012.  -Restaging labs on 10/15/2012 consistent with improvement in the serum M. Protein.  -Continuation of Carfilzomib/Revlimid/dexamethasone.  -Continued improvement in the serum M spike and IgG level on 11/29/2012.  -Improvement in the serum M spike, slight increase in IgG on 01/03/2013.  -Improvement in the serum M spike, stable IgG, stable lambda free light chains 01/31/2013  -The IgG and serum M spike were slightly higher 02/28/2013, stable lambda light chains  -IgG, M spike, and lambda light chains slightly lower on 04/04/2013.  -Continuation of Carfilzomib/Revlimid/dexamethasone.  -IgG, M spike and lambda light chains stable 05/02/2013.  -IgG, serum M spike, and lambda free light chains stable on 07/11/2013; 08/08/2013.  -IgG, serum M spike, and lambda light chains  higher 10/17/2013 after being off treatment during July 2015  - Carfilzomib/decadron continued with a cycle starting 10/17/2013  -Increase in serum M spike, light chains and IgG 02/13/2014. - Carfilzomib/ Decadron continued, Revlimid resumed. -Increased serum M spike, light chains, and IgG 06/12/2014: Carrfilzomib/Decadron/Revlimid discontinued -B[facility] marrow biopsy at [facility] 07/30/2014 consistent with plasma cell myeloma, 60% plasma cells  -Enrollment on a clinical trial at [facility] with bendamustine, pomalidomide, and Decadron-treatment started 08/29/2015 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 He has been unable to lower the Duragesic patch dose  4. Irritable bowel syndrome  5. Hypertension  6. "Chest pain " and dyspnea with exertion ,evaluated by cardiology, . Normal stress nuclear study 11/07/2012 and 08/07/2013.  7. history of Diarrhea. Most likely related to Revlimid, improved with Imodium 8. Febrile illness 12/05/2013 with associated diffuse bone pain-no source for infection identified  9. Pneumonia 08/05/2014, CT with changes consistent with an atypical infection, clinical improvement with Levaquin 10. Progressive anemia secondary to multiple myeloma 11. Admission 09/01/2014 with chest pain, nausea, and exertional dyspnea 12. Pancytopenia secondary to sepsis 13. Altered mental status secondary to sepsis syndrome 14. Sepsis syndrome-blood culture from right hand on 09/02/2014 positive for gram-negative rods, ID/sensitivity pending  Mr. Hovis developed clinical evidence of sepsis yesterday and a blood culture is positive for gram-negative rods. No apparent source for infection. The Port-A-Cath does not appear clinically infected..  Recommendations: 1. Hold pomalidomide 2. Antibiotics/management of sepsis syndrome per infectious disease and the medical service 3. Please call oncology as needed.   LOS: 2 days  [place],  [place]  09/03/2014, 1:33 PM

## 2014-09-03 NOTE — Evaluation (Signed)
Physical Therapy Evaluation Patient Details Name: Walter Dennis MRN: [id number] DOB: Nov 07, 1950 Today's Date: 09/03/2014   History of Present Illness  64 yo male admitted with exertional chest pain, new LBBB, sepsis, dyspnea, + troponins. Hx of myeloma, HTN. Pt was Ind PTA  Clinical Impression  On eval, pt required Min assist (+2 for safety) for mobility-able to ambulate ~125 feet while holding on to IV pole and therapy tech's arm. Slow gait speed. Pt reported bil LE soreness while ambulating. O2 sats dropped to mid 70s-low 80s% on RA while ambulating. Will continue to follow and progress activity as able/tolerated. At this time, do not feel pt will have any PT needs at discharge however we will continue to assess this during hospital stay.     Follow Up Recommendations No PT follow up    Equipment Recommendations   (to be determined-may not need any DME)    Recommendations for Other Services OT consult     Precautions / Restrictions Precautions Precautions: Fall Precaution Comments: monitor vitals Restrictions Weight Bearing Restrictions: No      Mobility  Bed Mobility Overal bed mobility: Needs Assistance Bed Mobility: Supine to Sit     Supine to sit: Supervision     General bed mobility comments: for safety  Transfers Overall transfer level: Needs assistance   Transfers: Sit to/from Stand Sit to Stand: Min guard         General transfer comment: close guard for safety  Ambulation/Gait Ambulation/Gait assistance: Min assist +2 safety Ambulation Distance (Feet): 125 Feet Assistive device:  (IV pole) Gait Pattern/deviations: Step-through pattern     General Gait Details: unsteady. intermittent assist to stabilize. Sats dropped to mid 70s-low 80s% on RA so replaced [place] O2-then upper 80s-low90s%  Stairs            Wheelchair Mobility    Modified Rankin (Stroke Patients Only)       Balance                                              Pertinent Vitals/Pain Pain Assessment: Faces Faces Pain Scale: Hurts even more Pain Location: bil LEs when ambulating Pain Descriptors / Indicators: Sore Pain Intervention(s): Monitored during session    Home Living Family/patient expects to be discharged to:: Private residence Living Arrangements: Spouse/significant other   Type of Home: Apartment Home Access: Level entry     Home Layout: Able to live on main level with bedroom/bathroom;Two level Home Equipment: None      Prior Function Level of Independence: Independent               Hand Dominance   Dominant Hand: Right    Extremity/Trunk Assessment   Upper Extremity Assessment: Overall WFL for tasks assessed           Lower Extremity Assessment: Generalized weakness      Cervical / Trunk Assessment: Normal  Communication   Communication: No difficulties  Cognition Arousal/Alertness: Awake/alert Behavior During Therapy: WFL for tasks assessed/performed Overall Cognitive Status: Within Functional Limits for tasks assessed                      General Comments      Exercises        Assessment/Plan    PT Assessment Patient needs continued PT services  PT Diagnosis Altered mental status;Difficulty walking;Generalized weakness;Acute pain  PT Problem List Decreased strength;Decreased activity tolerance;Decreased balance;Decreased mobility;Pain;Decreased cognition  PT Treatment Interventions Gait training;Functional mobility training;Therapeutic activities;Patient/family education;Balance training   PT Goals (Current goals can be found in the Care Plan section) Acute Rehab PT Goals Patient Stated Goal: get better PT Goal Formulation: With patient/family Time For Goal Achievement: 09/17/14 Potential to Achieve Goals: Good    Frequency Min 3X/week   Barriers to discharge        Co-evaluation               End of Session Equipment Utilized During Treatment:  Oxygen Activity Tolerance: Patient limited by fatigue;Patient limited by pain             Time: 1423-1447 PT Time Calculation (min) (ACUTE ONLY): 24 min   Charges:   PT Evaluation $Initial PT Evaluation Tier I: 1 Procedure PT Treatments $Gait Training: 8-22 mins   PT G Codes:        Weston Anna, MPT Pager: [phone]

## 2014-09-04 ENCOUNTER — Other Ambulatory Visit (HOSPITAL_COMMUNITY): Payer: BLUE CROSS/BLUE SHIELD

## 2014-09-04 ENCOUNTER — Other Ambulatory Visit: Payer: Self-pay

## 2014-09-04 DIAGNOSIS — L270 Generalized skin eruption due to drugs and medicaments taken internally: Secondary | ICD-10-CM | POA: Diagnosis present

## 2014-09-04 DIAGNOSIS — I4891 Unspecified atrial fibrillation: Secondary | ICD-10-CM

## 2014-09-04 DIAGNOSIS — D696 Thrombocytopenia, unspecified: Secondary | ICD-10-CM

## 2014-09-04 DIAGNOSIS — R233 Spontaneous ecchymoses: Secondary | ICD-10-CM

## 2014-09-04 DIAGNOSIS — N179 Acute kidney failure, unspecified: Secondary | ICD-10-CM

## 2014-09-04 LAB — CBC WITH DIFFERENTIAL/PLATELET
Basophils Absolute: 0 10*3/uL (ref 0.0–0.1)
Basophils Relative: 0 % (ref 0–1)
Eosinophils Absolute: 0.1 10*3/uL (ref 0.0–0.7)
Eosinophils Relative: 6 % — ABNORMAL HIGH (ref 0–5)
HCT: 24.1 % — ABNORMAL LOW (ref 39.0–52.0)
Hemoglobin: 8 g/dL — ABNORMAL LOW (ref 13.0–17.0)
LYMPHS ABS: 0.2 10*3/uL — AB (ref 0.7–4.0)
LYMPHS PCT: 6 % — AB (ref 12–46)
MCH: 29 pg (ref 26.0–34.0)
MCHC: 33.2 g/dL (ref 30.0–36.0)
MCV: 87.3 fL (ref 78.0–100.0)
Monocytes Absolute: 0.1 10*3/uL (ref 0.1–1.0)
Monocytes Relative: 5 % (ref 3–12)
Neutro Abs: 1.9 10*3/uL (ref 1.7–7.7)
Neutrophils Relative %: 83 % — ABNORMAL HIGH (ref 43–77)
PLATELETS: 95 10*3/uL — AB (ref 150–400)
RBC: 2.76 MIL/uL — AB (ref 4.22–5.81)
RDW: 18.1 % — ABNORMAL HIGH (ref 11.5–15.5)
WBC: 2.3 10*3/uL — AB (ref 4.0–10.5)

## 2014-09-04 LAB — COMPREHENSIVE METABOLIC PANEL
ALT: 40 U/L (ref 17–63)
AST: 24 U/L (ref 15–41)
Albumin: 2.3 g/dL — ABNORMAL LOW (ref 3.5–5.0)
Alkaline Phosphatase: 85 U/L (ref 38–126)
Anion gap: 9 (ref 5–15)
BILIRUBIN TOTAL: 0.8 mg/dL (ref 0.3–1.2)
BUN: 17 mg/dL (ref 6–20)
CHLORIDE: 105 mmol/L (ref 101–111)
CO2: 19 mmol/L — ABNORMAL LOW (ref 22–32)
Calcium: 7.5 mg/dL — ABNORMAL LOW (ref 8.9–10.3)
Creatinine, Ser: 1.53 mg/dL — ABNORMAL HIGH (ref 0.61–1.24)
GFR calc Af Amer: 55 mL/min — ABNORMAL LOW (ref >60)
GFR calc non Af Amer: 47 mL/min — ABNORMAL LOW (ref >60)
Glucose, Bld: 121 mg/dL — ABNORMAL HIGH (ref 65–99)
POTASSIUM: 3.6 mmol/L (ref 3.5–5.1)
Sodium: 133 mmol/L — ABNORMAL LOW (ref 135–145)
Total Protein: 7.6 g/dL (ref 6.5–8.1)

## 2014-09-04 LAB — MAGNESIUM: Magnesium: 2.4 mg/dL (ref 1.7–2.4)

## 2014-09-04 LAB — BASIC METABOLIC PANEL
Anion gap: 8 (ref 5–15)
BUN: 17 mg/dL (ref 6–20)
CO2: 19 mmol/L — AB (ref 22–32)
Calcium: 7.1 mg/dL — ABNORMAL LOW (ref 8.9–10.3)
Chloride: 106 mmol/L (ref 101–111)
Creatinine, Ser: 1.47 mg/dL — ABNORMAL HIGH (ref 0.61–1.24)
GFR calc Af Amer: 57 mL/min — ABNORMAL LOW (ref >60)
GFR, EST NON AFRICAN AMERICAN: 49 mL/min — AB (ref >60)
GLUCOSE: 152 mg/dL — AB (ref 65–99)
POTASSIUM: 2.9 mmol/L — AB (ref 3.5–5.1)
Sodium: 133 mmol/L — ABNORMAL LOW (ref 135–145)

## 2014-09-04 LAB — PHOSPHORUS: Phosphorus: 2 mg/dL — ABNORMAL LOW (ref 2.5–4.6)

## 2014-09-04 LAB — PROTIME-INR
INR: 1.31 (ref 0.00–1.49)
PROTHROMBIN TIME: 16.4 s — AB (ref 11.6–15.2)

## 2014-09-04 LAB — HERPES SIMPLEX VIRUS(HSV) DNA BY PCR
HSV 1 DNA: NEGATIVE
HSV 2 DNA: NEGATIVE

## 2014-09-04 LAB — HEPATITIS C ANTIBODY

## 2014-09-04 MED ORDER — POTASSIUM CHLORIDE 10 MEQ/50ML IV SOLN
INTRAVENOUS | Status: AC
  Filled 2014-09-04: qty 50

## 2014-09-04 MED ORDER — DILTIAZEM HCL 100 MG IV SOLR
5.0000 mg/h | INTRAVENOUS | Status: DC
  Administered 2014-09-04: 5 mg/h via INTRAVENOUS
  Filled 2014-09-04: qty 100

## 2014-09-04 MED ORDER — METOPROLOL TARTRATE 1 MG/ML IV SOLN
INTRAVENOUS | Status: AC
  Filled 2014-09-04: qty 5

## 2014-09-04 MED ORDER — SODIUM CHLORIDE 0.9 % IV BOLUS (SEPSIS)
250.0000 mL | Freq: Once | INTRAVENOUS | Status: AC
Start: 2014-09-04 — End: 2014-09-04
  Administered 2014-09-04: 250 mL via INTRAVENOUS

## 2014-09-04 MED ORDER — AMIODARONE HCL IN DEXTROSE 360-4.14 MG/200ML-% IV SOLN
60.0000 mg/h | INTRAVENOUS | Status: DC
  Administered 2014-09-04: 60 mg/h via INTRAVENOUS
  Filled 2014-09-04: qty 200

## 2014-09-04 MED ORDER — IOHEXOL 300 MG/ML  SOLN: 25.0000 mL | INTRAMUSCULAR | Status: AC

## 2014-09-04 MED ORDER — MAGNESIUM SULFATE 2 GM/50ML IV SOLN
2.0000 g | Freq: Once | INTRAVENOUS | Status: AC
  Administered 2014-09-04: 2 g via INTRAVENOUS
  Filled 2014-09-04: qty 50

## 2014-09-04 MED ORDER — POTASSIUM CHLORIDE 10 MEQ/50ML IV SOLN
10.0000 meq | INTRAVENOUS | Status: AC
  Administered 2014-09-04 (×4): 10 meq via INTRAVENOUS
  Filled 2014-09-04 (×3): qty 50

## 2014-09-04 MED ORDER — AMIODARONE HCL IN DEXTROSE 360-4.14 MG/200ML-% IV SOLN: 30.0000 mg/h | INTRAVENOUS | Status: DC

## 2014-09-04 MED ORDER — AMIODARONE HCL IN DEXTROSE 360-4.14 MG/200ML-% IV SOLN
30.0000 mg/h | INTRAVENOUS | Status: DC
  Administered 2014-09-04 – 2014-09-08 (×7): 30 mg/h via INTRAVENOUS
  Filled 2014-09-04 (×8): qty 200

## 2014-09-04 MED ORDER — LEVOFLOXACIN IN D5W 750 MG/150ML IV SOLN
750.0000 mg | INTRAVENOUS | Status: DC
  Administered 2014-09-04 – 2014-09-11 (×8): 750 mg via INTRAVENOUS
  Filled 2014-09-04 (×8): qty 150

## 2014-09-04 MED ORDER — NOREPINEPHRINE BITARTRATE 1 MG/ML IV SOLN
2.0000 ug/min | INTRAVENOUS | Status: DC
  Filled 2014-09-04: qty 4

## 2014-09-04 MED ORDER — METOPROLOL TARTRATE 1 MG/ML IV SOLN
3.0000 mg | INTRAVENOUS | Status: AC
  Administered 2014-09-04: 3 mg via INTRAVENOUS

## 2014-09-04 MED ORDER — DILTIAZEM HCL 100 MG IV SOLR: 5.0000 mg/h | INTRAVENOUS | Status: DC

## 2014-09-04 NOTE — Progress Notes (Signed)
Patient Profile: 64 y/o male with multiple myeloma, undergoing chemo, admitted with chest pain and dyspnea. into a fib during the night then SR and now back into a fib with RVR   Subjective: No chest pain no SOB   Today with zosyn, developed hives and tachycardiac PAF with his new LBBB  Objective: Vital signs in last 24 hours: Temp:  [97.9 F (36.6 C)-103.2 F (39.6 C)] 97.9 F (36.6 C) (06/23 0800) Resp:  [16-26] 18 (06/23 0700) BP: (92-157)/(32-103) 98/53 mmHg (06/23 0700) SpO2:  [85 %-100 %] 99 % (06/23 0700) Weight change:  Last BM Date: 09/04/14 Intake/Output from previous day: 06/22 0701 - 06/23 0700 In: 1915 [P.O.:240; I.V.:1225; IV Piggyback:450] Out: 2025 [Urine:2025] Intake/Output this shift: Total I/O In: 5 [I.V.:5] Out: -   PE: General:Pleasant affect, NAD, not aware his heart is racing Skin:Warm and dry, brisk capillary refill, + rash over body macular papular  HEENT:normocephalic, sclera clear, mucus membranes moist Heart:irreg irreg without murmur, gallup, rub or click Lungs: with crackles in the bases,no  rhonchi, or wheezes XYI:AXKP, non tender, + BS, do not palpate liver spleen or masses Ext:no lower ext edema, 2+ pedal pulses, 2+ radial pulses Neuro:alert and oriented, MAE, follows commands, + facial symmetry Tele: a fib with RVR     Lab Results:  Recent Labs  09/03/14 0430 09/04/14 0558  WBC 1.9* 2.3*  HGB 8.1* 8.0*  HCT 25.0* 24.1*  PLT 92* 95*   BMET  Recent Labs  09/03/14 2350 09/04/14 0558  NA 133* 133*  K 2.9* 3.6  CL 106 105  CO2 19* 19*  GLUCOSE 152* 121*  BUN 17 17  CREATININE 1.47* 1.53*  CALCIUM 7.1* 7.5*    Recent Labs  09/01/14 2300 09/02/14 0134  TROPONINI 0.09* 0.09*    Lab Results  Component Value Date   CHOL 139 09/02/2014   HDL 39* 09/02/2014   LDLCALC 53 09/02/2014   TRIG 233* 09/02/2014   CHOLHDL 3.6 09/02/2014   No results found for: HGBA1C   No results found for: TSH  Hepatic  Function Panel  Recent Labs  09/04/14 0558  PROT 7.6  ALBUMIN 2.3*  AST 24  ALT 40  ALKPHOS 85  BILITOT 0.8    Recent Labs  09/02/14 0810  CHOL 139   No results for input(s): PROTIME in the last 72 hours.     Studies/Results: Ct Head Wo Contrast  09/02/2014   CLINICAL DATA:  Lethargy. Right arm drift. Somnolence and confusion  EXAM: CT HEAD WITHOUT CONTRAST  TECHNIQUE: Contiguous axial images were obtained from the base of the skull through the vertex without intravenous contrast.  COMPARISON:  None.  FINDINGS: Mild prominence of the sulci and ventricles noted consistent with brain atrophy. Subtle low attenuation within the subcortical and periventricular white matter noted. There is no abnormal extra-axial fluid collection, intracranial hemorrhage or infarct. There is mild mucosal thickening involving the left maxillary sinus. Retention cysts versus polyps noted within both maxillary sinuses. The mastoid air cells are clear. The calvarium is intact.  IMPRESSION: 1. No acute intracranial abnormalities.   Electronically Signed   By: Kerby Moors M.D.   On: 09/02/2014 15:03   Ct Angio Chest Pe W/cm &/or Wo Cm  09/02/2014   CLINICAL DATA:  64 year old male with sudden onset confusion, altered mental status, diaphoresis, lethargy. Multiple myeloma, chemotherapy underway. Initial encounter.  EXAM: CT ANGIOGRAPHY CHEST WITH CONTRAST  TECHNIQUE: Multidetector CT imaging of the chest was  performed using the standard protocol during bolus administration of intravenous contrast. Multiplanar CT image reconstructions and MIPs were obtained to evaluate the vascular anatomy.  CONTRAST:  159m OMNIPAQUE IOHEXOL 350 MG/ML SOLN  COMPARISON:  Noncontrast chest CT from yesterday, chest CTA 08/05/2014, and earlier  FINDINGS: Suboptimal contrast bolus timing in the pulmonary arterial tree. Increased respiratory motion artifact compared to the 08/05/2014 exam. The central pulmonary arteries remain patent. No  hilar pulmonary artery filling defect identified. No lobar segmental filling defect identified. The more distal branches are not well evaluated today.  Trace bilateral pleural effusions are new since yesterday. Dependent and right middle lobe atelectasis. Major airways remain patent. No consolidation.  Cardiomegaly. No pericardial effusion. Stable mediastinal nodes. Aortic and Coronary artery calcified atherosclerosis. No thoracic aortic dissection or aneurysm. Right chest porta cath. No axillary lymphadenopathy.  Hepatic steatosis, visible spleen, and bowel in the upper abdomen are stable.  Abnormal bone mineralization throughout the visible skeleton is stable since May. No acute fracture or dislocation identified in the thorax.  Review of the MIP images confirms the above findings.  IMPRESSION: 1. Suboptimal exam with regard to distal pulmonary arteries, no acute pulmonary embolus identified. 2. Trace pleural effusions are new since yesterday. Pulmonary atelectasis. 3. No other acute findings in the chest. Diffuse myelomatous skeletal involvement.   Electronically Signed   By: HGenevie AnnM.D.   On: 09/02/2014 15:09   Mr BJeri CosWBTContrast  09/02/2014   CLINICAL DATA:  Patient has myeloma. Shortness of breath, dizziness, and diaphoresis beginning earlier today. Fever of unknown origin.  EXAM: MRI HEAD WITHOUT AND WITH CONTRAST  TECHNIQUE: Multiplanar, multiecho pulse sequences of the brain and surrounding structures were obtained without and with intravenous contrast.  CONTRAST:  250mMULTIHANCE GADOBENATE DIMEGLUMINE 529 MG/ML IV SOLN  COMPARISON:  CT head earlier today.  FINDINGS: Equipment malfunction necessitated using an older model head coil. Furthermore patient motion is present. Image quality is reduced.  No evidence for acute infarction, hemorrhage, mass lesion, hydrocephalus, or extra-axial fluid. Mild cerebral and cerebellar atrophy. Mild subcortical and periventricular T2 and FLAIR hyperintensities,  likely chronic microvascular ischemic change. Flow voids are maintained throughout the carotid, basilar, and vertebral arteries. There are no areas of chronic hemorrhage.  Post infusion, no abnormal enhancement of the brain or meninges. Chronic LEFT maxillary sinus disease.  Heterogeneity of the skull base, and visualized portions of C2 and C3 vertebrae, also patchy heterogeneity of the diploic space, without expansile lesion, could represent myeloma involvement. No concern for pathologic fracture of the upper cervical vertebrae in those segments which are visualized.  IMPRESSION: Mild atrophy and small vessel disease. No acute intracranial findings.  No abnormal postcontrast enhancement of the brain or meninges. Specifically no evidence for cerebritis or meningitis.  Heterogeneity of the skull base and upper cervical region, less so the diploic space, but concern for bone marrow involvement with myeloma.   Electronically Signed   By: JoStaci Righter.D.   On: 09/02/2014 20:11   Dg Chest Port 1 View  09/02/2014   CLINICAL DATA:  Shortness of Breath  EXAM: PORTABLE CHEST - 1 VIEW  COMPARISON:  Chest radiograph and chest CT September 01, 2014  FINDINGS: There is generalized interstitial edema. There is no airspace consolidation. Heart is borderline enlarged with pulmonary venous hypertension. Port-A-Cath tip is in superior vena cava. No pneumothorax. No adenopathy.  IMPRESSION: Evidence of congestive heart failure.   Electronically Signed   By: WiLowella GripII M.D.  On: 09/02/2014 12:25   ECHO: Study Conclusions - Left ventricle: Abnormal septal motion Inferobasal hypokinesis The cavity size was normal. Wall thickness was normal. Systolic function was mildly reduced. The estimated ejection fraction was in the range of 45% to 50%. - Left atrium: The atrium was mildly dilated. - Atrial septum: No defect or patent foramen ovale was identified.  Medications: I have reviewed the patient's current  medications. Scheduled Meds: .  stroke: mapping our early stages of recovery book   Does not apply Once  . allopurinol  300 mg Oral Daily  . antiseptic oral rinse  7 mL Mouth Rinse BID  . aspirin  81 mg Oral Daily  . atorvastatin  80 mg Oral q1800  . carvedilol  6.25 mg Oral BID WC  . cefTAZidime (FORTAZ)  IV  2 g Intravenous 3 times per day  . feeding supplement (PRO-STAT 64)  30 mL Oral BID BM  . fentaNYL  50 mcg Transdermal Q72H  . heparin subcutaneous  5,000 Units Subcutaneous 3 times per day  . iohexol  25 mL Oral Q1 Hr x 2  . irbesartan  150 mg Oral Daily  . metronidazole  500 mg Intravenous Q8H  . pantoprazole  80 mg Oral Q1200  . potassium chloride  10 mEq Oral Daily  . pregabalin  75 mg Oral BID  . sodium chloride  3 mL Intravenous Q12H   Continuous Infusions: . sodium chloride 50 mL/hr at 09/03/14 2314  . diltiazem (CARDIZEM) infusion 5 mg/hr (09/04/14 1000)   PRN Meds:.sodium chloride, [DISCONTINUED] acetaminophen **OR** acetaminophen, acetaminophen, diphenhydrAMINE, gi cocktail, ibuprofen, ondansetron (ZOFRAN) IV, oxyCODONE, sodium chloride, sodium chloride  Assessment/Plan: Principal Problem:   Chest pain Active Problems:   Multiple myeloma   Essential hypertension   CKD (chronic kidney disease) stage 3, GFR 30-59 ml/min   GERD (gastroesophageal reflux disease)   Abdominal pain, chronic, epigastric   Cancer   FUO (fever of unknown origin)   Lethargy   Tachycardia   Neutropenic fever   Screen for STD (sexually transmitted disease)   Aspergillus pneumonia   Dyspnea   Gram-negative bacteremia  1.  Chest Pain + Dyspnea: Given symptoms of exertional chest pain and dyspnea, new LBBB, mildly elevated troponins and CT findings of coronary artery calcifications in the 3 major coronary vessels, he likely has significant CAD. However, given his current condition/ septic picture, he is not a candidate for cath at this time. Continue medical therapy for presumed CAD for  now: ASA, statin, BB and ARB.  We will continue to follow along and will re-consider LHC once his acute medical problems improve. echo with Ef 45-50% mildly reduced, last nuc  07/2013 his EF 54%. No confusion currently- no chest pain currently.   2.  PAF went into rapid a fib a little after 3 am today.  Initially felt heart racing and while up to BR almost passed out.  Now even with rates 130s not aware of tachycardia.  K+ is 3.6 and Mg+ is up to 2.4 after replacement.  CHA2DS2VASC score 2,  Now in a fib with RVR was on IV dilt at 5 but difficult to increase with soft BP-though it appears to be off so will resume at 39m /Hr.  .Marland Kitchen Also hgb at 8 MD to decide on IV heparin at this point.  ? Add IV dig vs amiodarone. `  3. Rash over body without pruritis. Rash began on legs yesterday pm  4. CKD -3   5. Anemia  h/h 8.0/24  6. New LBBB from 07/25/14    LOS: 3 days   Time spent with pt. : 15 minutes. [facility] R  Nurse Practitioner Certified Pager [phone] or after 5pm and on weekends call [phone] 09/04/2014, 10:45 AM   History and all data above reviewed.  Patient examined.  I agree with the findings as above.  He felt light headed when he went into atrial fib with RVR.  The patient exam reveals [id number]  ,  Lungs: Clear  ,  Abd: Positive bowel sounds, no rebound no guarding, Ext No edema  .  All available labs, radiology testing, previous records reviewed. Agree with documented assessment and plan.  CHEST PAIN:  No in patient work up planned as an in patient.  Atrial fib:  BP is soft.  I will not start Cardizem.  Instead I will try amio IV.  No heparin at this point given his anemia and other comorbid conditions.      Jeneen Rinks [facility]  11:37 AM  09/04/2014

## 2014-09-04 NOTE — Progress Notes (Signed)
Date:  September 04, 2014 U.R. performed for needs and level of care. Transferred down to sdu on [id number] due tolbbb and a,fib.  Iv cardizem drip started. Will continue to follow for Case Management needs.  Velva Harman, RN, BSN, [place]   [phone]

## 2014-09-04 NOTE — Progress Notes (Signed)
Pt complained of light headedness and SOB, noted on the monitor pt converted to SR with the rate of 60's and SBP in the 80's MAP of 50's. Held Amiodarone drip and notified Cardiology. Received new orders from PA and restarted Amiodarone at 30mg /hr. Pt is alert at present and denies any chest pain or other discomfort.

## 2014-09-04 NOTE — Progress Notes (Signed)
[facility] PROGRESS NOTE  Walter Dennis KDT:[id number] DOB: Jun 27, 1950 DOA: 09/01/2014 PCP: Effie Berkshire A  Brief Summary from prior PN  The patient is a 64 yo M with history of multiple myeloma, IgG-lambda dx 2008 s/p autologous stem cell transplant 2009, most recently with disease progression on carfilzomib, lenalidomide, and dexamethasone.  He started bendamustine and pomalidomide on 08/29/2014.  He has history of HTN.  Per patient, he has had fevers, chills, night sweats attributed to his MM for the last year.  He will rigor and have associated SOB which typically resolves on its own.  Separate from these symptoms, he has had intermittent severe exertional chest pain which radiates to both shoulders which is severe and associated with SOB, mild nausea, significant lightheadedness and diaphoresis.  Over the last few weeks, he has had difficulty walking up a flight of stairs before becoming SOB.  The last few days, he cannot walk across a room before he develops chest pain.  Pain is relieved by rest.  He had a CT scan which demonstrated no evidence of pneumonia or PE but did demonstrated three vessel calcifications.  Per oncology, his chemotherapy does not typically cause myocarditis or pericarditis or acute heart failure.  Cardiology has been consulted for possible unstable angina.  I have notified oncology at [facility] of patient's admission and Dr. Benay Spice has been added to the rounding list here at [facility].    Assessment/Plan   Sepsis with fever, tachycardia, tachypnea, AMS in setting of MM and rapidly dropping WBC count concerning for febrile neutropenia.  Had history of aspergillosis infection during stem cell transplant in 2009 and has been immunosuppressed for long time due to progressive myeloma and chemotherapy - ID and Neurology on board and assisting with management. - Pt is currently on Aciclovir Ceftazidime and Vancomycin - Lactic acid level WNL currently - Gram - rod on blood culture  sensitivity pending.  Atrial fibrillation - Patient started on Cardizem drip - Cardiology on board currently - Administer fluid bolus  Chest pain concerning for unstable angina.  He has no known plasmacytomas in the region.  DDx includes perimyocarditis give mildly elevated troponins.  Given calcifications on CT and longstanding HTN, unstable angina is concerning.  He had neg cath 4 years ago and negative myoview one year ago.  -  Telemetry with cardiology on board and assisting with management. 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  He started bendamustine and pomalidomide on 08/29/2014.  -  Agree with holding chemotherapy pending further investigation for infection -  Dr. SBenay Spicefollowing currently   Pancytopenia due to MM and chemotherapy -  WBC steady with mild increase on last check - oncology on board.    Hyponatremia may be due to poor oral solute intake -  Rehydrate gently - reassess next am.  Acute urinary retention, foley catheter placed  Diet:  NPO Access:  PIV IVF:  resume Proph: heparin  Code Status: full Family Communication: patient  Disposition Plan: stepdown unit   Consultants:  Cardiology  Procedures:  CT angio chest  Antibiotics:  vanco 6/21 >   Zosyn 6/21 x 1  Ceftazidime 6/21 >   HPI/Subjective: Pt has no new complaints currently. Denies any chest pain or shortness of  breath  Objective: Filed Vitals:   09/04/14 1115 09/04/14 1130 09/04/14 1145 09/04/14 1200  BP: 113/71 119/97 91/52   Pulse:      Temp:    98.9 F (37.2 C)  TempSrc:    Oral  Resp: 23 16 19    Height:      Weight:      SpO2: 100% 100% 100%     Intake/Output Summary (Last 24  hours) at 09/04/14 1408 Last data filed at 09/04/14 1100  Gross per 24 hour  Intake   1610 ml  Output   2025 ml  Net   -415 ml   Filed Weights   09/01/14 1902 09/02/14 1300 09/03/14 0400  Weight: 99.791 kg (220 lb) 102.5 kg (225 lb 15.5 oz) 99 kg (218 lb 4.1 oz)    Exam: Initial:    General:  Pt alert and awake, in NAD  HEENT:  NCAT, MMM  Cardiovascular:  Irregularly irregular, nl S1, S2 no mrg, 2+ pulses, warm extremities  Respiratory:  CTAB, no wheezes  Abdomen:   NABS, soft, NT/ND  MSK:   Normal tone and bulk, no LEE  Neuro:  Answers question appropriately no facial asymmetry, moves extremities equally   Data Reviewed: Basic Metabolic Panel:  Recent Labs Lab 09/01/14 1359 09/02/14 0810 09/03/14 0430 09/03/14 2350 09/04/14 0558  NA 137 133* 133* 133* 133*  K 4.4 3.6 3.2* 2.9* 3.6  CL 104 98* 102 106 105  CO2 24 24 22  19* 19*  GLUCOSE 122* 120* 170* 152* 121*  BUN 17 18 21* 17 17  CREATININE 1.26* 1.13 1.56* 1.47* 1.53*  CALCIUM 7.6* 7.7* 6.7* 7.1* 7.5*  MG  --   --  1.4*  --  2.4  PHOS  --   --   --   --  2.0*   Liver Function Tests:  Recent Labs Lab 09/02/14 0810 09/03/14 0430 09/04/14 0558  AST 17 35 24  ALT 39 45 40  ALKPHOS 78 86 85  BILITOT 0.8 1.0 0.8  PROT 9.1* 7.8 7.6  ALBUMIN 2.8* 2.3* 2.3*   No results for input(s): LIPASE, AMYLASE in the last 168 hours.  Recent Labs Lab 09/03/14 0510  AMMONIA 44*   CBC:  Recent Labs Lab 09/01/14 1359 09/02/14 0810 09/03/14 0430 09/04/14 0558  WBC 2.4* 1.8* 1.9* 2.3*  NEUTROABS  --  1.3* 1.5* 1.9  HGB 9.3* 9.0* 8.1* 8.0*  HCT 28.7* 28.2* 25.0* 24.1*  MCV 89.4 88.1 88.3 87.3  PLT 109* 105* 92* 95*   Cardiac Enzymes:  Recent Labs Lab 09/01/14 2005 09/01/14 2300 09/02/14 0134  TROPONINI 0.08* 0.09* 0.09*   BNP (last 3 results)  Recent Labs  09/01/14 1359  BNP 452.2*    ProBNP (last 3 results) No results for input(s): PROBNP in the last 8760 hours.  CBG: No results for  input(s): GLUCAP in the last 168 hours.  Recent Results (from the past 240 hour(s))  Culture, blood (routine x 2)     Status: None (Preliminary result)   Collection Time: 09/02/14  8:10 AM  Result Value Ref Range Status   Specimen Description BLOOD RIGHT ARM  Final   Special Requests BOTTLES DRAWN AEROBIC AND ANAEROBIC 10CC  Final   Culture   Final    NO GROWTH 2 DAYS Performed at [facility]    Report Status PENDING  Incomplete  Culture, blood (routine x 2)     Status: None (Preliminary result)   Collection Time: 09/02/14  8:15  AM  Result Value Ref Range Status   Specimen Description BLOOD RIGHT HAND  Final   Special Requests BOTTLES DRAWN AEROBIC AND ANAEROBIC 5CC  Final   Culture  Setup Time   Final    GRAM NEGATIVE RODS ANAEROBIC BOTTLE ONLY CRITICAL RESULT CALLED TO, READ BACK BY AND VERIFIED WITH: Nehemiah Settle RN @ [phone] ON [id number] BY Rhea Bleacher    Culture   Final    Lonell Grandchild NEGATIVE RODS Performed at [facility]    Report Status PENDING  Incomplete  MRSA PCR Screening     Status: None   Collection Time: 09/02/14  3:35 PM  Result Value Ref Range Status   MRSA by PCR NEGATIVE NEGATIVE Final    Comment:        The GeneXpert MRSA Assay (FDA approved for NASAL specimens only), is one component of a comprehensive MRSA colonization surveillance program. It is not intended to diagnose MRSA infection nor to guide or monitor treatment for MRSA infections.   CSF culture with Stat gram stain     Status: None (Preliminary result)   Collection Time: 09/02/14  5:20 PM  Result Value Ref Range Status   Specimen Description CSF  Final   Special Requests Immunocompromised  Final   Gram Stain   Final    CYTOSPIN SMEAR NO WBC SEEN NO ORGANISMS SEEN Gram Stain Report Called to,Read Back By and Verified With: PAM Margarito Courser [id number] @ 1924 BY J SCOTTON    Culture   Final    NO GROWTH 1 DAY Performed at [facility]    Report Status PENDING  Incomplete  AFB  culture with smear     Status: None (Preliminary result)   Collection Time: 09/02/14  5:20 PM  Result Value Ref Range Status   Specimen Description CSF  Final   Special Requests Immunocompromised  Final   Acid Fast Smear   Final    NO ACID FAST BACILLI SEEN Performed at [facility]    Culture   Final    CULTURE WILL BE EXAMINED FOR 6 WEEKS BEFORE ISSUING A FINAL REPORT Performed at [facility]    Report Status PENDING  Incomplete  Fungus Culture with Smear     Status: None (Preliminary result)   Collection Time: 09/02/14  5:20 PM  Result Value Ref Range Status   Specimen Description CSF  Final   Special Requests Immunocompromised  Final   Fungal Smear   Final    NO YEAST OR FUNGAL ELEMENTS SEEN Performed at [facility]    Culture   Final    CULTURE IN PROGRESS FOR FOUR WEEKS Performed at [facility]    Report Status PENDING  Incomplete  Gram stain     Status: None   Collection Time: 09/02/14  5:20 PM  Result Value Ref Range Status   Specimen Description CSF  Final   Special Requests NONE  Final   Gram Stain   Final    NO ORGANISMS SEEN NO WBC SEEN CYTOSPUN SMEAR Gram Stain Report Called to,Read Back By and Verified With: PAM WEST,RN [id number] @ 1924 BY J SCOTTON    Report Status 09/02/2014 FINAL  Final     Studies: Ct Head Wo Contrast  09/02/2014   CLINICAL DATA:  Lethargy. Right arm drift. Somnolence and confusion  EXAM: CT HEAD WITHOUT CONTRAST  TECHNIQUE: Contiguous axial images were obtained from the base of the skull through the vertex without intravenous contrast.  COMPARISON:  None.  FINDINGS: Mild prominence of the sulci and ventricles noted consistent with brain atrophy. Subtle low attenuation within the subcortical and periventricular white matter noted. There is no abnormal extra-axial fluid collection, intracranial hemorrhage or infarct. There is mild mucosal thickening involving the left maxillary sinus. Retention cysts versus  polyps noted within both maxillary sinuses. The mastoid air cells are clear. The calvarium is intact.  IMPRESSION: 1. No acute intracranial abnormalities.   Electronically Signed   By: Kerby Moors M.D.   On: 09/02/2014 15:03   Ct Angio Chest Pe W/cm &/or Wo Cm  09/02/2014   CLINICAL DATA:  64 year old male with sudden onset confusion, altered mental status, diaphoresis, lethargy. Multiple myeloma, chemotherapy underway. Initial encounter.  EXAM: CT ANGIOGRAPHY CHEST WITH CONTRAST  TECHNIQUE: Multidetector CT imaging of the chest was performed using the standard protocol during bolus administration of intravenous contrast. Multiplanar CT image reconstructions and MIPs were obtained to evaluate the vascular anatomy.  CONTRAST:  181m OMNIPAQUE IOHEXOL 350 MG/ML SOLN  COMPARISON:  Noncontrast chest CT from yesterday, chest CTA 08/05/2014, and earlier  FINDINGS: Suboptimal contrast bolus timing in the pulmonary arterial tree. Increased respiratory motion artifact compared to the 08/05/2014 exam. The central pulmonary arteries remain patent. No hilar pulmonary artery filling defect identified. No lobar segmental filling defect identified. The more distal branches are not well evaluated today.  Trace bilateral pleural effusions are new since yesterday. Dependent and right middle lobe atelectasis. Major airways remain patent. No consolidation.  Cardiomegaly. No pericardial effusion. Stable mediastinal nodes. Aortic and Coronary artery calcified atherosclerosis. No thoracic aortic dissection or aneurysm. Right chest porta cath. No axillary lymphadenopathy.  Hepatic steatosis, visible spleen, and bowel in the upper abdomen are stable.  Abnormal bone mineralization throughout the visible skeleton is stable since May. No acute fracture or dislocation identified in the thorax.  Review of the MIP images confirms the above findings.  IMPRESSION: 1. Suboptimal exam with regard to distal pulmonary arteries, no acute pulmonary  embolus identified. 2. Trace pleural effusions are new since yesterday. Pulmonary atelectasis. 3. No other acute findings in the chest. Diffuse myelomatous skeletal involvement.   Electronically Signed   By: HGenevie AnnM.D.   On: 09/02/2014 15:09   Mr BJeri CosWSHContrast  09/02/2014   CLINICAL DATA:  Patient has myeloma. Shortness of breath, dizziness, and diaphoresis beginning earlier today. Fever of unknown origin.  EXAM: MRI HEAD WITHOUT AND WITH CONTRAST  TECHNIQUE: Multiplanar, multiecho pulse sequences of the brain and surrounding structures were obtained without and with intravenous contrast.  CONTRAST:  228mMULTIHANCE GADOBENATE DIMEGLUMINE 529 MG/ML IV SOLN  COMPARISON:  CT head earlier today.  FINDINGS: Equipment malfunction necessitated using an older model head coil. Furthermore patient motion is present. Image quality is reduced.  No evidence for acute infarction, hemorrhage, mass lesion, hydrocephalus, or extra-axial fluid. Mild cerebral and cerebellar atrophy. Mild subcortical and periventricular T2 and FLAIR hyperintensities, likely chronic microvascular ischemic change. Flow voids are maintained throughout the carotid, basilar, and vertebral arteries. There are no areas of chronic hemorrhage.  Post infusion, no abnormal enhancement of the brain or meninges. Chronic LEFT maxillary sinus disease.  Heterogeneity of the skull base, and visualized portions of C2 and C3 vertebrae, also patchy heterogeneity of the diploic space, without expansile lesion, could represent myeloma involvement. No concern for pathologic fracture of the upper cervical vertebrae in those segments which are visualized.  IMPRESSION: Mild atrophy and small vessel disease. No acute intracranial findings.  No abnormal  postcontrast enhancement of the brain or meninges. Specifically no evidence for cerebritis or meningitis.  Heterogeneity of the skull base and upper cervical region, less so the diploic space, but concern for bone  marrow involvement with myeloma.   Electronically Signed   By: Staci Righter M.D.   On: 09/02/2014 20:11    Scheduled Meds: .  stroke: mapping our early stages of recovery book   Does not apply Once  . allopurinol  300 mg Oral Daily  . antiseptic oral rinse  7 mL Mouth Rinse BID  . aspirin  81 mg Oral Daily  . atorvastatin  80 mg Oral q1800  . carvedilol  6.25 mg Oral BID WC  . feeding supplement (PRO-STAT 64)  30 mL Oral BID BM  . fentaNYL  50 mcg Transdermal Q72H  . heparin subcutaneous  5,000 Units Subcutaneous 3 times per day  . levofloxacin (LEVAQUIN) IV  750 mg Intravenous Q24H  . metronidazole  500 mg Intravenous Q8H  . pantoprazole  80 mg Oral Q1200  . potassium chloride  10 mEq Oral Daily  . pregabalin  75 mg Oral BID  . sodium chloride  3 mL Intravenous Q12H   Continuous Infusions: . sodium chloride 50 mL/hr at 09/04/14 1100  . amiodarone 60 mg/hr (09/04/14 1250)  . amiodarone      Principal Problem:   Chest pain Active Problems:   Multiple myeloma   Essential hypertension   CKD (chronic kidney disease) stage 3, GFR 30-59 ml/min   GERD (gastroesophageal reflux disease)   Abdominal pain, chronic, epigastric   Cancer   FUO (fever of unknown origin)   Lethargy   Tachycardia   Neutropenic fever   Screen for STD (sexually transmitted disease)   Aspergillus pneumonia   Dyspnea   Gram-negative bacteremia   Drug rash    Time spent: 35 min    Velvet Bathe  [facility] Pager [phone] If 7PM-7AM, please contact night-coverage at [email or web address], password [facility] 09/04/2014, 2:08 PM  LOS: 3 days

## 2014-09-04 NOTE — Progress Notes (Signed)
Pt noted to have systemic hives and a short episode of tachycardia to 160s while receiving first dose of Zosyn. RN responded to bedside and stopped Zosyn infusion and obtained EKG. NP Baltazar Najjar paged, new orders received. Pt denies shortness of breath, itching, and throat tightening. Blood pressure and O2 sats remained stable during entire episode. Pt resting comfortably at this time. Will continue to monitor.  Dorrene German, RN

## 2014-09-04 NOTE — Progress Notes (Signed)
OT Cancellation Note  Patient Details Name: Finnis Colee MRN: [id number] DOB: 14-Nov-1950   Cancelled Treatment:    Reason Eval/Treat Not Completed: Medical issues which prohibited therapy:  Increased HR.  Will check back.  Darvis Croft 09/04/2014, 1:38 PM  Lesle Chris, OTR/L [phone] 09/04/2014

## 2014-09-04 NOTE — Progress Notes (Signed)
ANTIBIOTIC CONSULT NOTE - FOLLOW UP  Pharmacy Consult for Levaquin Indication: intra-abdominal infection, PCN allergy  Allergies  Allergen Reactions  . Penicillins Hives  . Zosyn [Piperacillin Sod-Tazobactam So] Hives    Patient Measurements: Height: 6' (182.9 cm) Weight: 218 lb 4.1 oz (99 kg) IBW/kg (Calculated) : 77.6  Vital Signs: Temp: 97.9 F (36.6 C) (06/23 0800) Temp Source: Oral (06/23 0800) BP: 98/53 mmHg (06/23 0700) Intake/Output from previous day: 06/22 0701 - 06/23 0700 In: 1915 [P.O.:240; I.V.:1225; IV Piggyback:450] Out: 2025 [Urine:2025] Intake/Output from this shift: Total I/O In: 5 [I.V.:5] Out: -   Labs:  Recent Labs  09/02/14 0810 09/03/14 0430 09/03/14 2350 09/04/14 0558  WBC 1.8* 1.9*  --  2.3*  HGB 9.0* 8.1*  --  8.0*  PLT 105* 92*  --  95*  CREATININE 1.13 1.56* 1.47* 1.53*   Estimated Creatinine Clearance: 61 mL/min (by C-G formula based on Cr of 1.53). No results for input(s): VANCOTROUGH, VANCOPEAK, VANCORANDOM, GENTTROUGH, GENTPEAK, GENTRANDOM, TOBRATROUGH, TOBRAPEAK, TOBRARND, AMIKACINPEAK, AMIKACINTROU, AMIKACIN in the last 72 hours.   Microbiology: Recent Results (from the past 720 hour(s))  Culture, blood (routine x 2)     Status: None   Collection Time: 08/06/14  1:00 AM  Result Value Ref Range Status   Specimen Description BLOOD LAC  Final   Special Requests BOTTLES DRAWN AEROBIC AND ANAEROBIC 5ML  Final   Culture   Final    NO GROWTH 5 DAYS Performed at [facility]    Report Status 08/12/2014 FINAL  Final  Culture, blood (routine x 2)     Status: None   Collection Time: 08/06/14  1:15 AM  Result Value Ref Range Status   Specimen Description BLOOD RAC  Final   Special Requests BOTTLES DRAWN AEROBIC AND ANAEROBIC 5CC  Final   Culture   Final    NO GROWTH 5 DAYS Performed at [facility]    Report Status 08/12/2014 FINAL  Final  Culture, blood (routine x 2)     Status: None (Preliminary result)   Collection Time: 09/02/14  8:10 AM  Result Value Ref Range Status   Specimen Description BLOOD RIGHT ARM  Final   Special Requests BOTTLES DRAWN AEROBIC AND ANAEROBIC 10CC  Final   Culture   Final    NO GROWTH 2 DAYS Performed at [facility]    Report Status PENDING  Incomplete  Culture, blood (routine x 2)     Status: None (Preliminary result)   Collection Time: 09/02/14  8:15 AM  Result Value Ref Range Status   Specimen Description BLOOD RIGHT HAND  Final   Special Requests BOTTLES DRAWN AEROBIC AND ANAEROBIC 5CC  Final   Culture  Setup Time   Final    GRAM NEGATIVE RODS ANAEROBIC BOTTLE ONLY CRITICAL RESULT CALLED TO, READ BACK BY AND VERIFIED WITH: Nehemiah Settle RN @ [phone] ON [id number] BY Rhea Bleacher    Culture   Final    Lonell Grandchild NEGATIVE RODS Performed at [facility]    Report Status PENDING  Incomplete  MRSA PCR Screening     Status: None   Collection Time: 09/02/14  3:35 PM  Result Value Ref Range Status   MRSA by PCR NEGATIVE NEGATIVE Final    Comment:        The GeneXpert MRSA Assay (FDA approved for NASAL specimens only), is one component of a comprehensive MRSA colonization surveillance program. It is not intended to diagnose MRSA infection nor to guide  or monitor treatment for MRSA infections.   CSF culture with Stat gram stain     Status: None (Preliminary result)   Collection Time: 09/02/14  5:20 PM  Result Value Ref Range Status   Specimen Description CSF  Final   Special Requests Immunocompromised  Final   Gram Stain   Final    CYTOSPIN SMEAR NO WBC SEEN NO ORGANISMS SEEN Gram Stain Report Called to,Read Back By and Verified With: PAM Margarito Courser [id number] @ 1924 BY J SCOTTON    Culture   Final    NO GROWTH 1 DAY Performed at [facility]    Report Status PENDING  Incomplete  AFB culture with smear     Status: None (Preliminary result)   Collection Time: 09/02/14  5:20 PM  Result Value Ref Range Status   Specimen Description CSF  Final    Special Requests Immunocompromised  Final   Acid Fast Smear   Final    NO ACID FAST BACILLI SEEN Performed at [facility]    Culture   Final    CULTURE WILL BE EXAMINED FOR 6 WEEKS BEFORE ISSUING A FINAL REPORT Performed at [facility]    Report Status PENDING  Incomplete  Fungus Culture with Smear     Status: None (Preliminary result)   Collection Time: 09/02/14  5:20 PM  Result Value Ref Range Status   Specimen Description CSF  Final   Special Requests Immunocompromised  Final   Fungal Smear   Final    NO YEAST OR FUNGAL ELEMENTS SEEN Performed at [facility]    Culture   Final    CULTURE IN PROGRESS FOR FOUR WEEKS Performed at [facility]    Report Status PENDING  Incomplete  Gram stain     Status: None   Collection Time: 09/02/14  5:20 PM  Result Value Ref Range Status   Specimen Description CSF  Final   Special Requests NONE  Final   Gram Stain   Final    NO ORGANISMS SEEN NO WBC SEEN CYTOSPUN SMEAR Gram Stain Report Called to,Read Back By and Verified With: PAM WEST,RN [id number] @ 1924 BY J SCOTTON    Report Status 09/02/2014 FINAL  Final    Anti-infectives    Start     Dose/Rate Route Frequency Ordered Stop   09/04/14 1200  levofloxacin (LEVAQUIN) IVPB 750 mg     750 mg 100 mL/hr over 90 Minutes Intravenous Every 24 hours 09/04/14 1128     09/03/14 2330  cefTAZidime (FORTAZ) 2 g in dextrose 5 % 50 mL IVPB  Status:  Discontinued     2 g 100 mL/hr over 30 Minutes Intravenous 3 times per day 09/03/14 2324 09/04/14 1121   09/03/14 2330  metroNIDAZOLE (FLAGYL) IVPB 500 mg     500 mg 100 mL/hr over 60 Minutes Intravenous Every 8 hours 09/03/14 2324     09/03/14 2200  piperacillin-tazobactam (ZOSYN) IVPB 3.375 g  Status:  Discontinued     3.375 g 12.5 mL/hr over 240 Minutes Intravenous 3 times per day 09/03/14 1801 09/03/14 2303   09/03/14 1500  metroNIDAZOLE (FLAGYL) tablet 500 mg  Status:  Discontinued     500 mg Oral 3 times  per day 09/03/14 1423 09/03/14 1754   09/02/14 2000  vancomycin (VANCOCIN) 1,250 mg in sodium chloride 0.9 % 250 mL IVPB  Status:  Discontinued     1,250 mg 166.7 mL/hr over 90 Minutes Intravenous Every 12 hours 09/02/14  0272 09/03/14 1754   09/02/14 1930  acyclovir (ZOVIRAX) 775 mg in dextrose 5 % 150 mL IVPB  Status:  Discontinued     775 mg 165.5 mL/hr over 60 Minutes Intravenous 3 times per day 09/02/14 1901 09/03/14 1754   09/02/14 1400  piperacillin-tazobactam (ZOSYN) IVPB 3.375 g  Status:  Discontinued     3.375 g 12.5 mL/hr over 240 Minutes Intravenous 3 times per day 09/02/14 0749 09/02/14 1209   09/02/14 1400  cefTAZidime (FORTAZ) 2 g in dextrose 5 % 50 mL IVPB  Status:  Discontinued     2 g 100 mL/hr over 30 Minutes Intravenous 3 times per day 09/02/14 1210 09/03/14 1754   09/02/14 0800  vancomycin (VANCOCIN) 1,250 mg in sodium chloride 0.9 % 250 mL IVPB     1,250 mg 166.7 mL/hr over 90 Minutes Intravenous STAT 09/02/14 0752 09/02/14 0930   09/02/14 0745  piperacillin-tazobactam (ZOSYN) IVPB 3.375 g     3.375 g 100 mL/hr over 30 Minutes Intravenous  Once 09/02/14 0737 09/02/14 0815   09/02/14 0745  vancomycin (VANCOCIN) IVPB 1000 mg/200 mL premix  Status:  Discontinued     1,000 mg 200 mL/hr over 60 Minutes Intravenous  Once 09/02/14 0737 09/02/14 0746      Assessment: 7 yoM with multiple myeloma with pancytopenia and now FUO with HA, confusion, and ACS. ID following and suspects intra-abdominal source given culture results, but would also like to rule out intra-abdominal abscess. Developed rash on Zosyn and changed to Ceftazidime/Flagyl.  Changing ceftaz to Levaquin with rash continuing to worsen, and to help r/o RMSF.  6/21 >> Vancomycin >> 6/22 6/21 >> Zosyn >> 6/21, restart 6/22 >> 6/22 6/21 >> Ceftazidime >> 6/21, restart 6/22 >> 6/23 6/21 >> Acyclovir >> 6/22 6/22 >> flagyl >> 6/23 >> levofloxacin >>  Temp: Regular temps to 103 WBC: low (last ANC 1900) Renal:  SCr 1.56; CrCl 61 CG (stopped vancomycin and acyclovir, high-dose ibuprofen)  6/21 blood x 2: 1/2 GNR on GS (only GS resulted yet) 6/21 CSF - Gm stain clear; culture pending 6/21 CSF Fungal: Gm stain clear; Cx pending 6/21 CSF AFB: IP HIV: neg HepC: neg Cryptococcal Ag: neg HSV PCR: IP Histoplasma Ag: IP  Dose changes/levels: 6/21: Zosyn chg to Ceftazidime 2/2 concern for worsening cytopenias 6/22: chg back to Zosyn with removal of vancomycin 6/22: chg back to ceftazidime/flagyl with rash from Zosyn 6/23: ID chg ceftaz to levaquin with worsening hives, also some RMSF coverage   Goal of Therapy:  Eradication of infection Appropriate antibiotic dosing for indication and renal function  Plan:  Day 3 antibiotics  Levaquin 750 mg IV q24 hr.  Watch renal function  Flagyl per MD  Follow clinical course, renal function, culture results as available  Follow for de-escalation of antibiotics and LOT   Reuel Boom, PharmD Pager: [phone] 09/04/2014, 11:41 AM

## 2014-09-04 NOTE — Progress Notes (Signed)
[facility]    Subjective:  Patient had what sounds lie a petechial rash that was developing on his left leg yesterday am and has progressed (before he got zosyn) and then hives with zosyn infusion  Antibiotics:  Anti-infectives    Start     Dose/Rate Route Frequency Ordered Stop   09/04/14 1200  levofloxacin (LEVAQUIN) IVPB 750 mg     750 mg 100 mL/hr over 90 Minutes Intravenous Every 24 hours 09/04/14 1128     09/03/14 2330  cefTAZidime (FORTAZ) 2 g in dextrose 5 % 50 mL IVPB  Status:  Discontinued     2 g 100 mL/hr over 30 Minutes Intravenous 3 times per day 09/03/14 2324 09/04/14 1121   09/03/14 2330  metroNIDAZOLE (FLAGYL) IVPB 500 mg     500 mg 100 mL/hr over 60 Minutes Intravenous Every 8 hours 09/03/14 2324     09/03/14 2200  piperacillin-tazobactam (ZOSYN) IVPB 3.375 g  Status:  Discontinued     3.375 g 12.5 mL/hr over 240 Minutes Intravenous 3 times per day 09/03/14 1801 09/03/14 2303   09/03/14 1500  metroNIDAZOLE (FLAGYL) tablet 500 mg  Status:  Discontinued     500 mg Oral 3 times per day 09/03/14 1423 09/03/14 1754   09/02/14 2000  vancomycin (VANCOCIN) 1,250 mg in sodium chloride 0.9 % 250 mL IVPB  Status:  Discontinued     1,250 mg 166.7 mL/hr over 90 Minutes Intravenous Every 12 hours 09/02/14 0850 09/03/14 1754   09/02/14 1930  acyclovir (ZOVIRAX) 775 mg in dextrose 5 % 150 mL IVPB  Status:  Discontinued     775 mg 165.5 mL/hr over 60 Minutes Intravenous 3 times per day 09/02/14 1901 09/03/14 1754   09/02/14 1400  piperacillin-tazobactam (ZOSYN) IVPB 3.375 g  Status:  Discontinued     3.375 g 12.5 mL/hr over 240 Minutes Intravenous 3 times per day 09/02/14 0749 09/02/14 1209   09/02/14 1400  cefTAZidime (FORTAZ) 2 g in dextrose 5 % 50 mL IVPB  Status:  Discontinued     2 g 100 mL/hr over 30 Minutes Intravenous 3 times per day 09/02/14 1210 09/03/14 1754   09/02/14 0800  vancomycin (VANCOCIN) 1,250 mg in sodium chloride  0.9 % 250 mL IVPB     1,250 mg 166.7 mL/hr over 90 Minutes Intravenous STAT 09/02/14 0752 09/02/14 0930   09/02/14 0745  piperacillin-tazobactam (ZOSYN) IVPB 3.375 g     3.375 g 100 mL/hr over 30 Minutes Intravenous  Once 09/02/14 0737 09/02/14 0815   09/02/14 0745  vancomycin (VANCOCIN) IVPB 1000 mg/200 mL premix  Status:  Discontinued     1,000 mg 200 mL/hr over 60 Minutes Intravenous  Once 09/02/14 0737 09/02/14 0746      Medications: Scheduled Meds: .  stroke: mapping our early stages of recovery book   Does not apply Once  . allopurinol  300 mg Oral Daily  . antiseptic oral rinse  7 mL Mouth Rinse BID  . aspirin  81 mg Oral Daily  . atorvastatin  80 mg Oral q1800  . carvedilol  6.25 mg Oral BID WC  . feeding supplement (PRO-STAT 64)  30 mL Oral BID BM  . fentaNYL  50 mcg Transdermal Q72H  . heparin subcutaneous  5,000 Units Subcutaneous 3 times per day  . levofloxacin (LEVAQUIN) IV  750 mg Intravenous Q24H  . metronidazole  500 mg Intravenous Q8H  . pantoprazole  80  mg Oral Q1200  . potassium chloride  10 mEq Oral Daily  . pregabalin  75 mg Oral BID  . sodium chloride  3 mL Intravenous Q12H   Continuous Infusions: . sodium chloride 50 mL/hr at 09/04/14 1100  . amiodarone    . amiodarone     PRN Meds:.sodium chloride, [DISCONTINUED] acetaminophen **OR** acetaminophen, acetaminophen, diphenhydrAMINE, gi cocktail, ondansetron (ZOFRAN) IV, oxyCODONE, sodium chloride, sodium chloride    Objective: Weight change:   Intake/Output Summary (Last 24 hours) at 09/04/14 1211 Last data filed at 09/04/14 1100  Gross per 24 hour  Intake   1710 ml  Output   2025 ml  Net   -315 ml   Blood pressure 91/52, pulse 108, temperature 97.9 F (36.6 C), temperature source Oral, resp. rate 19, height 6' (1.829 m), weight 218 lb 4.1 oz (99 kg), SpO2 100 %. Temp:  [97.9 F (36.6 C)-103.2 F (39.6 C)] 97.9 F (36.6 C) (06/23 1100) Resp:  [14-26] 19 (06/23 1145) BP: (78-157)/(32-107)  91/52 mmHg (06/23 1145) SpO2:  [90 %-100 %] 100 % (06/23 1145)  Physical Exam: General: Alert and awake, oriented x3, not in any acute distress but slightly flushed HEENT: anicteric sclera, pupils reactive to light and accommodation, EOMI, oropharynx clear and without exudate CVS tachy rate, normal r, no murmur rubs or gallops Chest: clear to auscultation bilaterally, no wheezing, rales or rhonchi Abdomen: soft nontender, nondistended, normal bowel sounds, Extremities:  Petechial rash in lower legs, more Macular papularon arms and chest  pics from 09/04/14:            Skin: no rashes other than flushed face, porta cath is clean Neuro: nonfocal, strength and sensation intact  CBC: CBC Latest Ref Rng 09/04/2014 09/03/2014 09/02/2014  WBC 4.0 - 10.5 K/uL 2.3(L) 1.9(L) 1.8(L)  Hemoglobin 13.0 - 17.0 g/dL 8.0(L) 8.1(L) 9.0(L)  Hematocrit 39.0 - 52.0 % 24.1(L) 25.0(L) 28.2(L)  Platelets 150 - 400 K/uL 95(L) 92(L) 105(L)       BMET  Recent Labs  09/03/14 2350 09/04/14 0558  NA 133* 133*  K 2.9* 3.6  CL 106 105  CO2 19* 19*  GLUCOSE 152* 121*  BUN 17 17  CREATININE 1.47* 1.53*  CALCIUM 7.1* 7.5*     Liver Panel   Recent Labs  09/03/14 0430 09/04/14 0558  PROT 7.8 7.6  ALBUMIN 2.3* 2.3*  AST 35 24  ALT 45 40  ALKPHOS 86 85  BILITOT 1.0 0.8       Sedimentation Rate No results for input(s): ESRSEDRATE in the last 72 hours. C-Reactive Protein No results for input(s): CRP in the last 72 hours.  Micro Results: Recent Results (from the past 720 hour(s))  Culture, blood (routine x 2)     Status: None   Collection Time: 08/06/14  1:00 AM  Result Value Ref Range Status   Specimen Description BLOOD LAC  Final   Special Requests BOTTLES DRAWN AEROBIC AND ANAEROBIC 5ML  Final   Culture   Final    NO GROWTH 5 DAYS Performed at [facility]    Report Status 08/12/2014 FINAL  Final  Culture, blood (routine x 2)     Status: None   Collection  Time: 08/06/14  1:15 AM  Result Value Ref Range Status   Specimen Description BLOOD RAC  Final   Special Requests BOTTLES DRAWN AEROBIC AND ANAEROBIC 5CC  Final   Culture   Final    NO GROWTH 5 DAYS Performed at [facility]  Report Status 08/12/2014 FINAL  Final  Culture, blood (routine x 2)     Status: None (Preliminary result)   Collection Time: 09/02/14  8:10 AM  Result Value Ref Range Status   Specimen Description BLOOD RIGHT ARM  Final   Special Requests BOTTLES DRAWN AEROBIC AND ANAEROBIC 10CC  Final   Culture   Final    NO GROWTH 2 DAYS Performed at [facility]    Report Status PENDING  Incomplete  Culture, blood (routine x 2)     Status: None (Preliminary result)   Collection Time: 09/02/14  8:15 AM  Result Value Ref Range Status   Specimen Description BLOOD RIGHT HAND  Final   Special Requests BOTTLES DRAWN AEROBIC AND ANAEROBIC 5CC  Final   Culture  Setup Time   Final    GRAM NEGATIVE RODS ANAEROBIC BOTTLE ONLY CRITICAL RESULT CALLED TO, READ BACK BY AND VERIFIED WITH: Nehemiah Settle RN @ [phone] ON [id number] BY Rhea Bleacher    Culture   Final    Lonell Grandchild NEGATIVE RODS Performed at [facility]    Report Status PENDING  Incomplete  MRSA PCR Screening     Status: None   Collection Time: 09/02/14  3:35 PM  Result Value Ref Range Status   MRSA by PCR NEGATIVE NEGATIVE Final    Comment:        The GeneXpert MRSA Assay (FDA approved for NASAL specimens only), is one component of a comprehensive MRSA colonization surveillance program. It is not intended to diagnose MRSA infection nor to guide or monitor treatment for MRSA infections.   CSF culture with Stat gram stain     Status: None (Preliminary result)   Collection Time: 09/02/14  5:20 PM  Result Value Ref Range Status   Specimen Description CSF  Final   Special Requests Immunocompromised  Final   Gram Stain   Final    CYTOSPIN SMEAR NO WBC SEEN NO ORGANISMS SEEN Gram Stain Report Called  to,Read Back By and Verified With: PAM Margarito Courser [id number] @ 1924 BY J SCOTTON    Culture   Final    NO GROWTH 1 DAY Performed at [facility]    Report Status PENDING  Incomplete  AFB culture with smear     Status: None (Preliminary result)   Collection Time: 09/02/14  5:20 PM  Result Value Ref Range Status   Specimen Description CSF  Final   Special Requests Immunocompromised  Final   Acid Fast Smear   Final    NO ACID FAST BACILLI SEEN Performed at [facility]    Culture   Final    CULTURE WILL BE EXAMINED FOR 6 WEEKS BEFORE ISSUING A FINAL REPORT Performed at [facility]    Report Status PENDING  Incomplete  Fungus Culture with Smear     Status: None (Preliminary result)   Collection Time: 09/02/14  5:20 PM  Result Value Ref Range Status   Specimen Description CSF  Final   Special Requests Immunocompromised  Final   Fungal Smear   Final    NO YEAST OR FUNGAL ELEMENTS SEEN Performed at [facility]    Culture   Final    CULTURE IN PROGRESS FOR FOUR WEEKS Performed at [facility]    Report Status PENDING  Incomplete  Gram stain     Status: None   Collection Time: 09/02/14  5:20 PM  Result Value Ref Range Status   Specimen Description CSF  Final  Special Requests NONE  Final   Gram Stain   Final    NO ORGANISMS SEEN NO WBC SEEN CYTOSPUN SMEAR Gram Stain Report Called to,Read Back By and Verified With: PAM WEST,RN [id number] @ 1924 BY J SCOTTON    Report Status 09/02/2014 FINAL  Final    Studies/Results: Ct Head Wo Contrast  09/02/2014   CLINICAL DATA:  Lethargy. Right arm drift. Somnolence and confusion  EXAM: CT HEAD WITHOUT CONTRAST  TECHNIQUE: Contiguous axial images were obtained from the base of the skull through the vertex without intravenous contrast.  COMPARISON:  None.  FINDINGS: Mild prominence of the sulci and ventricles noted consistent with brain atrophy. Subtle low attenuation within the subcortical and  periventricular white matter noted. There is no abnormal extra-axial fluid collection, intracranial hemorrhage or infarct. There is mild mucosal thickening involving the left maxillary sinus. Retention cysts versus polyps noted within both maxillary sinuses. The mastoid air cells are clear. The calvarium is intact.  IMPRESSION: 1. No acute intracranial abnormalities.   Electronically Signed   By: Kerby Moors M.D.   On: 09/02/2014 15:03   Ct Angio Chest Pe W/cm &/or Wo Cm  09/02/2014   CLINICAL DATA:  64 year old male with sudden onset confusion, altered mental status, diaphoresis, lethargy. Multiple myeloma, chemotherapy underway. Initial encounter.  EXAM: CT ANGIOGRAPHY CHEST WITH CONTRAST  TECHNIQUE: Multidetector CT imaging of the chest was performed using the standard protocol during bolus administration of intravenous contrast. Multiplanar CT image reconstructions and MIPs were obtained to evaluate the vascular anatomy.  CONTRAST:  160m OMNIPAQUE IOHEXOL 350 MG/ML SOLN  COMPARISON:  Noncontrast chest CT from yesterday, chest CTA 08/05/2014, and earlier  FINDINGS: Suboptimal contrast bolus timing in the pulmonary arterial tree. Increased respiratory motion artifact compared to the 08/05/2014 exam. The central pulmonary arteries remain patent. No hilar pulmonary artery filling defect identified. No lobar segmental filling defect identified. The more distal branches are not well evaluated today.  Trace bilateral pleural effusions are new since yesterday. Dependent and right middle lobe atelectasis. Major airways remain patent. No consolidation.  Cardiomegaly. No pericardial effusion. Stable mediastinal nodes. Aortic and Coronary artery calcified atherosclerosis. No thoracic aortic dissection or aneurysm. Right chest porta cath. No axillary lymphadenopathy.  Hepatic steatosis, visible spleen, and bowel in the upper abdomen are stable.  Abnormal bone mineralization throughout the visible skeleton is stable  since May. No acute fracture or dislocation identified in the thorax.  Review of the MIP images confirms the above findings.  IMPRESSION: 1. Suboptimal exam with regard to distal pulmonary arteries, no acute pulmonary embolus identified. 2. Trace pleural effusions are new since yesterday. Pulmonary atelectasis. 3. No other acute findings in the chest. Diffuse myelomatous skeletal involvement.   Electronically Signed   By: HGenevie AnnM.D.   On: 09/02/2014 15:09   Mr BJeri CosWDQContrast  09/02/2014   CLINICAL DATA:  Patient has myeloma. Shortness of breath, dizziness, and diaphoresis beginning earlier today. Fever of unknown origin.  EXAM: MRI HEAD WITHOUT AND WITH CONTRAST  TECHNIQUE: Multiplanar, multiecho pulse sequences of the brain and surrounding structures were obtained without and with intravenous contrast.  CONTRAST:  215mMULTIHANCE GADOBENATE DIMEGLUMINE 529 MG/ML IV SOLN  COMPARISON:  CT head earlier today.  FINDINGS: Equipment malfunction necessitated using an older model head coil. Furthermore patient motion is present. Image quality is reduced.  No evidence for acute infarction, hemorrhage, mass lesion, hydrocephalus, or extra-axial fluid. Mild cerebral and cerebellar atrophy. Mild subcortical and periventricular T2 and  FLAIR hyperintensities, likely chronic microvascular ischemic change. Flow voids are maintained throughout the carotid, basilar, and vertebral arteries. There are no areas of chronic hemorrhage.  Post infusion, no abnormal enhancement of the brain or meninges. Chronic LEFT maxillary sinus disease.  Heterogeneity of the skull base, and visualized portions of C2 and C3 vertebrae, also patchy heterogeneity of the diploic space, without expansile lesion, could represent myeloma involvement. No concern for pathologic fracture of the upper cervical vertebrae in those segments which are visualized.  IMPRESSION: Mild atrophy and small vessel disease. No acute intracranial findings.  No abnormal  postcontrast enhancement of the brain or meninges. Specifically no evidence for cerebritis or meningitis.  Heterogeneity of the skull base and upper cervical region, less so the diploic space, but concern for bone marrow involvement with myeloma.   Electronically Signed   By: Staci Righter M.D.   On: 09/02/2014 20:11      Assessment/Plan:  Principal Problem:   Chest pain Active Problems:   Multiple myeloma   Essential hypertension   CKD (chronic kidney disease) stage 3, GFR 30-59 ml/min   GERD (gastroesophageal reflux disease)   Abdominal pain, chronic, epigastric   Cancer   FUO (fever of unknown origin)   Lethargy   Tachycardia   Neutropenic fever   Screen for STD (sexually transmitted disease)   Aspergillus pneumonia   Dyspnea   Gram-negative bacteremia    Walter Dennis is a 64 y.o. 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 He is growing a GNR (anaerobe) from  1 admission blood culture. Now with petechial and MP rash both possibly due to beta lactam (see discussion below)    #1 FUO in pancytopenic patient with Multiple Myeloma, HA, confusion, ACS. Given the anaerobic gram negative rod, I suspect an intraabdominal source from simple gut translocation in setting of neutropenia but would like to rule out intrabdominlal abscess  There is also possibility for RMSF but that diagnosis does not unify well with the anerobe we are finding in blood, I am considering it  --I will change to levaquin to cover gram +, and gram - aerobes from gut, along with the flagyl he is now getting for anerobes  FQ have been found to have activity vs RMSF in anecdotal cases)  Will check baseline RMSF and ehrlichia ab (acute ab  not meaningful but can serve as reference)  Will consider + doxy but would like to otherwise leave things as is for now  Will order CT today with PO contrast alone  #2 Petechial rash and MP rash: suspect due to beta lactams, ceftaz and then zosyn so will steer clear of both  Other possibility would be RMSF but why would he have that AND anerobic bacteremia, seems unlikely  #3 ARF; I have dc nephrotoxic abx including vanco, acylcovir but he was also getting high dose ibuprofen 828m tid and ARB--and I have taken liberty of stopping both of these  #4 Ttpenia: expect due to chemotherapy, sepsis less likely RMSF        LOS: 3 days   CAlcide Evener6/23/2016, 12:11 PM

## 2014-09-04 NOTE — Care Management Note (Signed)
Case Management Note  Patient Details  Name: Walter Dennis MRN: [id number] Date of Birth: Dec 14, 1950  Subjective/Objective:        [id number] to sdu due to s.fib and lbbb            Action/Plan: tbd   Expected Discharge Date:   (unknown)              [id number] Expected Discharge Plan:  Home/Self Care  In-House Referral:     Discharge planning Services  CM Consult  Post Acute Care Choice:    Choice offered to:     DME Arranged:    DME Agency:     HH Arranged:    [place] Agency:     Status of Service:  In process, will continue to follow  Medicare Important Message Given:    Date Medicare IM Given:    Medicare IM give by:    Date Additional Medicare IM Given:    Additional Medicare Important Message give by:     If discussed at [place] of Stay Meetings, dates discussed:    Additional Comments:  Leeroy Cha, RN 09/04/2014, 11:10 AM

## 2014-09-04 NOTE — Progress Notes (Signed)
eLink Physician-Brief Progress Note Patient Name: Walter Dennis DOB: 30-Apr-1950 MRN: [id number]  Date of Service  09/04/2014   HPI/Events of Note  baselinew new LBBB - now A FIB RVR  Lab review  - mag 1.4gm% 24h ago and received only 1gm of mag sulfate  - k 2.9 few hours ago - results just arrived   eICU Interventions  cardizem gtt (lopressor stat) Mag replacement + check K replacement      Walter Dennis 09/04/2014, 3:10 AM

## 2014-09-05 ENCOUNTER — Inpatient Hospital Stay (HOSPITAL_COMMUNITY): Payer: BLUE CROSS/BLUE SHIELD

## 2014-09-05 DIAGNOSIS — R7881 Bacteremia: Secondary | ICD-10-CM | POA: Diagnosis present

## 2014-09-05 DIAGNOSIS — I480 Paroxysmal atrial fibrillation: Secondary | ICD-10-CM

## 2014-09-05 DIAGNOSIS — R1110 Vomiting, unspecified: Secondary | ICD-10-CM

## 2014-09-05 DIAGNOSIS — R6521 Severe sepsis with septic shock: Secondary | ICD-10-CM

## 2014-09-05 DIAGNOSIS — A4150 Gram-negative sepsis, unspecified: Secondary | ICD-10-CM

## 2014-09-05 LAB — COMPREHENSIVE METABOLIC PANEL
ALK PHOS: 91 U/L (ref 38–126)
ALT: 36 U/L (ref 17–63)
AST: 27 U/L (ref 15–41)
Albumin: 2.2 g/dL — ABNORMAL LOW (ref 3.5–5.0)
Anion gap: 8 (ref 5–15)
BILIRUBIN TOTAL: 0.6 mg/dL (ref 0.3–1.2)
BUN: 16 mg/dL (ref 6–20)
CALCIUM: 7.8 mg/dL — AB (ref 8.9–10.3)
CHLORIDE: 107 mmol/L (ref 101–111)
CO2: 18 mmol/L — ABNORMAL LOW (ref 22–32)
CREATININE: 1.55 mg/dL — AB (ref 0.61–1.24)
GFR calc Af Amer: 54 mL/min — ABNORMAL LOW (ref >60)
GFR calc non Af Amer: 46 mL/min — ABNORMAL LOW (ref >60)
GLUCOSE: 145 mg/dL — AB (ref 65–99)
Potassium: 3.7 mmol/L (ref 3.5–5.1)
Sodium: 133 mmol/L — ABNORMAL LOW (ref 135–145)
Total Protein: 7.8 g/dL (ref 6.5–8.1)

## 2014-09-05 LAB — PROTIME-INR
INR: 1.39 (ref 0.00–1.49)
Prothrombin Time: 17.2 seconds — ABNORMAL HIGH (ref 11.6–15.2)

## 2014-09-05 LAB — CBC WITH DIFFERENTIAL/PLATELET
Basophils Absolute: 0 10*3/uL (ref 0.0–0.1)
Basophils Relative: 1 % (ref 0–1)
Eosinophils Absolute: 0.2 10*3/uL (ref 0.0–0.7)
Eosinophils Relative: 5 % (ref 0–5)
HCT: 25 % — ABNORMAL LOW (ref 39.0–52.0)
Hemoglobin: 8.2 g/dL — ABNORMAL LOW (ref 13.0–17.0)
LYMPHS PCT: 11 % — AB (ref 12–46)
Lymphs Abs: 0.5 10*3/uL — ABNORMAL LOW (ref 0.7–4.0)
MCH: 28.7 pg (ref 26.0–34.0)
MCHC: 32.8 g/dL (ref 30.0–36.0)
MCV: 87.4 fL (ref 78.0–100.0)
MONOS PCT: 4 % (ref 3–12)
Monocytes Absolute: 0.2 10*3/uL (ref 0.1–1.0)
NEUTROS ABS: 3.3 10*3/uL (ref 1.7–7.7)
Neutrophils Relative %: 79 % — ABNORMAL HIGH (ref 43–77)
Platelets: 95 10*3/uL — ABNORMAL LOW (ref 150–400)
RBC: 2.86 MIL/uL — ABNORMAL LOW (ref 4.22–5.81)
RDW: 18.2 % — AB (ref 11.5–15.5)
WBC: 4.2 10*3/uL (ref 4.0–10.5)

## 2014-09-05 LAB — CLOSTRIDIUM DIFFICILE BY PCR: Toxigenic C. Difficile by PCR: NEGATIVE

## 2014-09-05 LAB — MAGNESIUM: Magnesium: 2 mg/dL (ref 1.7–2.4)

## 2014-09-05 MED ORDER — DOXYCYCLINE HYCLATE 100 MG IV SOLR
100.0000 mg | Freq: Two times a day (BID) | INTRAVENOUS | Status: DC
  Filled 2014-09-05: qty 100

## 2014-09-05 MED ORDER — LOPERAMIDE HCL 2 MG PO CAPS
4.0000 mg | ORAL_CAPSULE | ORAL | Status: DC | PRN
  Administered 2014-09-05 (×2): 4 mg via ORAL
  Filled 2014-09-05 (×2): qty 2

## 2014-09-05 NOTE — Progress Notes (Addendum)
Around 2200 patient started having frequent PVC's. Then patient had a run of 5-7 beats of SVT getting consequtively longer but converted back to NSR in the 60s-70s directly after. Pt alert and oriented, resting in bed with no complaints. Cardiology paged and aware, no new orders received. Shortly after patient HR in 130s/140s and sustaining for 1.5 hours despite resting and denying pain. EKG showed sinus tach with LBBB. Cardiology returned page 30 mins later but pt had converted back to sinus rhythm in the 70s.

## 2014-09-05 NOTE — Progress Notes (Signed)
OT Cancellation Note  Patient Details Name: Walter Dennis MRN: [id number] DOB: [birth year]   Cancelled Treatment:    Reason Eval/Treat Not Completed: Medical issues which prohibited therapy.  Nausea with movement today per RN. Will check back.    [name] 09/05/2014, 11:11 AM  Lesle Chris, OTR/L [phone] 09/05/2014

## 2014-09-05 NOTE — Progress Notes (Signed)
SUBJECTIVE:  No new cardiac complaints.    PHYSICAL EXAM Filed Vitals:   09/05/14 0600 09/05/14 0700 09/05/14 0729 09/05/14 0800  BP: 185/45 165/62 137/47   Pulse:      Temp:  103.2 F (39.6 C)  100.6 F (38.1 C)  TempSrc:  Oral  Oral  Resp: 21 21 23    Height:      Weight:      SpO2: 100% 95% 100%    General:  No distress Lungs:  Clear Heart:  RRR Abdomen:  Positive bowel sounds, no rebound no guarding Extremities:  No edema  LABS:  Results for orders placed or performed during the hospital encounter of 09/01/14 (from the past 24 hour(s))  Clostridium Difficile by PCR (not at [facility])     Status: None   Collection Time: 09/05/14  2:53 AM  Result Value Ref Range   C difficile by pcr NEGATIVE NEGATIVE  Comprehensive metabolic panel     Status: Abnormal   Collection Time: 09/05/14  5:00 AM  Result Value Ref Range   Sodium 133 (L) 135 - 145 mmol/L   Potassium 3.7 3.5 - 5.1 mmol/L   Chloride 107 101 - 111 mmol/L   CO2 18 (L) 22 - 32 mmol/L   Glucose, Bld 145 (H) 65 - 99 mg/dL   BUN 16 6 - 20 mg/dL   Creatinine, Ser 1.55 (H) 0.61 - 1.24 mg/dL   Calcium 7.8 (L) 8.9 - 10.3 mg/dL   Total Protein 7.8 6.5 - 8.1 g/dL   Albumin 2.2 (L) 3.5 - 5.0 g/dL   AST 27 15 - 41 U/L   ALT 36 17 - 63 U/L   Alkaline Phosphatase 91 38 - 126 U/L   Total Bilirubin 0.6 0.3 - 1.2 mg/dL   GFR calc non Af Amer 46 (L) >60 mL/min   GFR calc Af Amer 54 (L) >60 mL/min   Anion gap 8 5 - 15  Magnesium     Status: None   Collection Time: 09/05/14  5:00 AM  Result Value Ref Range   Magnesium 2.0 1.7 - 2.4 mg/dL  CBC with Differential/Platelet     Status: Abnormal   Collection Time: 09/05/14  5:00 AM  Result Value Ref Range   WBC 4.2 4.0 - 10.5 K/uL   RBC 2.86 (L) 4.22 - 5.81 MIL/uL   Hemoglobin 8.2 (L) 13.0 - 17.0 g/dL   HCT 25.0 (L) 39.0 - 52.0 %   MCV 87.4 78.0 - 100.0 fL   MCH 28.7 26.0 - 34.0 pg   MCHC 32.8 30.0 - 36.0 g/dL   RDW 18.2 (H) 11.5 - 15.5 %   Platelets 95 (L) 150 - 400 K/uL     Neutrophils Relative % 79 (H) 43 - 77 %   Lymphocytes Relative 11 (L) 12 - 46 %   Monocytes Relative 4 3 - 12 %   Eosinophils Relative 5 0 - 5 %   Basophils Relative 1 0 - 1 %   Neutro Abs 3.3 1.7 - 7.7 K/uL   Lymphs Abs 0.5 (L) 0.7 - 4.0 K/uL   Monocytes Absolute 0.2 0.1 - 1.0 K/uL   Eosinophils Absolute 0.2 0.0 - 0.7 K/uL   Basophils Absolute 0.0 0.0 - 0.1 K/uL   RBC Morphology POLYCHROMASIA PRESENT   Protime-INR     Status: Abnormal   Collection Time: 09/05/14  5:00 AM  Result Value Ref Range   Prothrombin Time 17.2 (H) 11.6 - 15.2 seconds   INR  1.39 0.00 - 1.49    Intake/Output Summary (Last 24 hours) at 09/05/14 1051 Last data filed at 09/05/14 0800  Gross per 24 hour  Intake 2438.53 ml  Output   1550 ml  Net 888.53 ml    ASSESSMENT AND PLAN:  CHEST PAIN:  No plans for in patient work up.  See previous rounding notes.   ATRIAL FIB:  Back in NSR.  Keep IV amio but change to PO in the AM if his N/V is improved.   Jeneen Rinks [facility] 09/05/2014 10:51 AM

## 2014-09-05 NOTE — Consult Note (Signed)
Reason for Consult:infected port a cath Referring Physician: Dr. Theador Hawthorne Dennis is an 64 y.o. male.  HPI: We have been asked to see Walter Dennis to remove his port a cath for suspected infection recommended by ID.  Past Medical History  Diagnosis Date  . Hypertension   . Cancer     Myeloma    Past Surgical History  Procedure Laterality Date  . Portacath placement      right chest  . Cardiac catheterization      2012 - nml in [place]    Family History  Problem Relation Age of Onset  . Hypertension Mother   . Hypertension Father   . Hypertension Sister   . Hypertension Brother   . Heart disease Father     Social History:  reports that he quit smoking about 11 years ago. He has never used smokeless tobacco. He reports that he does not drink alcohol or use illicit drugs.  Allergies:  Allergies  Allergen Reactions  . Ceftazidime Rash    Petechial rash   . Penicillins Hives  . Zosyn [Piperacillin Sod-Tazobactam So] Hives    Medications: I have reviewed the patient's current medications.  Results for orders placed or performed during the hospital encounter of 09/01/14 (from the past 48 hour(s))  Basic metabolic panel     Status: Abnormal   Collection Time: 09/03/14 11:50 PM  Result Value Ref Range   Sodium 133 (L) 135 - 145 mmol/L   Potassium 2.9 (L) 3.5 - 5.1 mmol/L   Chloride 106 101 - 111 mmol/L   CO2 19 (L) 22 - 32 mmol/L   Glucose, Bld 152 (H) 65 - 99 mg/dL   BUN 17 6 - 20 mg/dL   Creatinine, Ser 1.47 (H) 0.61 - 1.24 mg/dL   Calcium 7.1 (L) 8.9 - 10.3 mg/dL   GFR calc non Af Amer 49 (L) >60 mL/min   GFR calc Af Amer 57 (L) >60 mL/min    Comment: (NOTE) The eGFR has been calculated using the CKD EPI equation. This calculation has not been validated in all clinical situations. eGFR's persistently <60 mL/min signify possible Chronic Kidney Disease.    Anion gap 8 5 - 15  Comprehensive metabolic panel     Status: Abnormal   Collection Time: 09/04/14  5:58  AM  Result Value Ref Range   Sodium 133 (L) 135 - 145 mmol/L   Potassium 3.6 3.5 - 5.1 mmol/L    Comment: REPEATED TO VERIFY DELTA CHECK NOTED NO VISIBLE HEMOLYSIS    Chloride 105 101 - 111 mmol/L   CO2 19 (L) 22 - 32 mmol/L   Glucose, Bld 121 (H) 65 - 99 mg/dL   BUN 17 6 - 20 mg/dL   Creatinine, Ser 1.53 (H) 0.61 - 1.24 mg/dL   Calcium 7.5 (L) 8.9 - 10.3 mg/dL   Total Protein 7.6 6.5 - 8.1 g/dL   Albumin 2.3 (L) 3.5 - 5.0 g/dL   AST 24 15 - 41 U/L   ALT 40 17 - 63 U/L   Alkaline Phosphatase 85 38 - 126 U/L   Total Bilirubin 0.8 0.3 - 1.2 mg/dL   GFR calc non Af Amer 47 (L) >60 mL/min   GFR calc Af Amer 55 (L) >60 mL/min    Comment: (NOTE) The eGFR has been calculated using the CKD EPI equation. This calculation has not been validated in all clinical situations. eGFR's persistently <60 mL/min signify possible Chronic Kidney Disease.  Anion gap 9 5 - 15  Magnesium     Status: None   Collection Time: 09/04/14  5:58 AM  Result Value Ref Range   Magnesium 2.4 1.7 - 2.4 mg/dL  CBC with Differential/Platelet     Status: Abnormal   Collection Time: 09/04/14  5:58 AM  Result Value Ref Range   WBC 2.3 (L) 4.0 - 10.5 K/uL   RBC 2.76 (L) 4.22 - 5.81 MIL/uL   Hemoglobin 8.0 (L) 13.0 - 17.0 g/dL   HCT 24.1 (L) 39.0 - 52.0 %   MCV 87.3 78.0 - 100.0 fL   MCH 29.0 26.0 - 34.0 pg   MCHC 33.2 30.0 - 36.0 g/dL   RDW 18.1 (H) 11.5 - 15.5 %   Platelets 95 (L) 150 - 400 K/uL    Comment: CONSISTENT WITH PREVIOUS RESULT   Neutrophils Relative % 83 (H) 43 - 77 %   Neutro Abs 1.9 1.7 - 7.7 K/uL   Lymphocytes Relative 6 (L) 12 - 46 %   Lymphs Abs 0.2 (L) 0.7 - 4.0 K/uL   Monocytes Relative 5 3 - 12 %   Monocytes Absolute 0.1 0.1 - 1.0 K/uL   Eosinophils Relative 6 (H) 0 - 5 %   Eosinophils Absolute 0.1 0.0 - 0.7 K/uL   Basophils Relative 0 0 - 1 %   Basophils Absolute 0.0 0.0 - 0.1 K/uL  Protime-INR     Status: Abnormal   Collection Time: 09/04/14  5:58 AM  Result Value Ref Range    Prothrombin Time 16.4 (H) 11.6 - 15.2 seconds   INR 1.31 0.00 - 1.49  Phosphorus     Status: Abnormal   Collection Time: 09/04/14  5:58 AM  Result Value Ref Range   Phosphorus 2.0 (L) 2.5 - 4.6 mg/dL  Clostridium Difficile by PCR (not at [facility])     Status: None   Collection Time: 09/05/14  2:53 AM  Result Value Ref Range   C difficile by pcr NEGATIVE NEGATIVE  Comprehensive metabolic panel     Status: Abnormal   Collection Time: 09/05/14  5:00 AM  Result Value Ref Range   Sodium 133 (L) 135 - 145 mmol/L   Potassium 3.7 3.5 - 5.1 mmol/L   Chloride 107 101 - 111 mmol/L   CO2 18 (L) 22 - 32 mmol/L   Glucose, Bld 145 (H) 65 - 99 mg/dL   BUN 16 6 - 20 mg/dL   Creatinine, Ser 1.55 (H) 0.61 - 1.24 mg/dL   Calcium 7.8 (L) 8.9 - 10.3 mg/dL   Total Protein 7.8 6.5 - 8.1 g/dL   Albumin 2.2 (L) 3.5 - 5.0 g/dL   AST 27 15 - 41 U/L   ALT 36 17 - 63 U/L   Alkaline Phosphatase 91 38 - 126 U/L   Total Bilirubin 0.6 0.3 - 1.2 mg/dL   GFR calc non Af Amer 46 (L) >60 mL/min   GFR calc Af Amer 54 (L) >60 mL/min    Comment: (NOTE) The eGFR has been calculated using the CKD EPI equation. This calculation has not been validated in all clinical situations. eGFR's persistently <60 mL/min signify possible Chronic Kidney Disease.    Anion gap 8 5 - 15  Magnesium     Status: None   Collection Time: 09/05/14  5:00 AM  Result Value Ref Range   Magnesium 2.0 1.7 - 2.4 mg/dL  CBC with Differential/Platelet     Status: Abnormal   Collection Time: 09/05/14  5:00 AM  Result Value Ref Range   WBC 4.2 4.0 - 10.5 K/uL   RBC 2.86 (L) 4.22 - 5.81 MIL/uL   Hemoglobin 8.2 (L) 13.0 - 17.0 g/dL   HCT 25.0 (L) 39.0 - 52.0 %   MCV 87.4 78.0 - 100.0 fL   MCH 28.7 26.0 - 34.0 pg   MCHC 32.8 30.0 - 36.0 g/dL   RDW 18.2 (H) 11.5 - 15.5 %   Platelets 95 (L) 150 - 400 K/uL    Comment: SPECIMEN CHECKED FOR CLOTS REPEATED TO VERIFY PLATELET COUNT CONFIRMED BY SMEAR    Neutrophils Relative % 79 (H) 43 - 77 %    Lymphocytes Relative 11 (L) 12 - 46 %   Monocytes Relative 4 3 - 12 %   Eosinophils Relative 5 0 - 5 %   Basophils Relative 1 0 - 1 %   Neutro Abs 3.3 1.7 - 7.7 K/uL   Lymphs Abs 0.5 (L) 0.7 - 4.0 K/uL   Monocytes Absolute 0.2 0.1 - 1.0 K/uL   Eosinophils Absolute 0.2 0.0 - 0.7 K/uL   Basophils Absolute 0.0 0.0 - 0.1 K/uL   RBC Morphology POLYCHROMASIA PRESENT   Protime-INR     Status: Abnormal   Collection Time: 09/05/14  5:00 AM  Result Value Ref Range   Prothrombin Time 17.2 (H) 11.6 - 15.2 seconds   INR 1.39 0.00 - 1.49    Dg Abd 1 View  09/05/2014   CLINICAL DATA:  Ongoing nausea, vomiting since last night. Mid abdominal discomfort.  EXAM: ABDOMEN - 1 VIEW  COMPARISON:  None.  FINDINGS: Nonobstructive bowel gas pattern. No supine evidence of free air. No organomegaly or suspicious calcification. No acute bony abnormality. Visualized lung bases clear.  IMPRESSION: No acute findings.   Electronically Signed   By: Rolm Baptise M.D.   On: 09/05/2014 11:06    Review of Systems  All other systems reviewed and are negative.  Blood pressure 119/94, pulse 108, temperature 102.1 F (38.9 C), temperature source Axillary, resp. rate 22, height 6' (1.829 m), weight 99 kg (218 lb 4.1 oz), SpO2 94 %. Physical Exam  Constitutional: He appears well-developed and well-nourished. No distress.  HENT:  Head: Normocephalic and atraumatic.  Eyes: Pupils are equal, round, and reactive to light.  Cardiovascular: Normal rate, regular rhythm and normal heart sounds.   Respiratory: Effort normal and breath sounds normal.  GI: Soft.  Neurological: He is alert.  Skin: Rash noted.  Psychiatric: His behavior is normal. Judgment normal.    Assessment/Plan: Infected port a cath.  Plan removal in the OR tomorrow. The risks were discussed.  Herald Vallin A 09/05/2014, 8:50 PM

## 2014-09-05 NOTE — Progress Notes (Signed)
Rash and redness on entire body appears to be worsening on arms and in groin area according to family, pt, and RN initial assessment. Pt states it is not painful or itchy. No new medications administered this shift. Will continue to monitor and assess.

## 2014-09-05 NOTE — Progress Notes (Signed)
PT Cancellation Note  Patient Details Name: Walter Dennis MRN: [id number] DOB: [birth year]   Cancelled Treatment:    Reason Eval/Treat Not Completed: Medical issues which prohibited therapy. Per OT, RN recommended therapy be held on today. Will check back another day.    Weston Anna, MPT Pager: [phone]

## 2014-09-05 NOTE — Progress Notes (Signed)
Walter Dennis for Infectious Disease    Subjective:  Rash worse, nausea with emesis  Antibiotics:  Anti-infectives    Start     Dose/Rate Route Frequency Ordered Stop   09/05/14 1000  doxycycline (VIBRAMYCIN) 100 mg in dextrose 5 % 250 mL IVPB  Status:  Discontinued     100 mg 125 mL/hr over 120 Minutes Intravenous Every 12 hours 09/05/14 0848 09/05/14 1004   09/04/14 1200  levofloxacin (LEVAQUIN) IVPB 750 mg     750 mg 100 mL/hr over 90 Minutes Intravenous Every 24 hours 09/04/14 1128     09/03/14 2330  cefTAZidime (FORTAZ) 2 g in dextrose 5 % 50 mL IVPB  Status:  Discontinued     2 g 100 mL/hr over 30 Minutes Intravenous 3 times per day 09/03/14 2324 09/04/14 1121   09/03/14 2330  metroNIDAZOLE (FLAGYL) IVPB 500 mg  Status:  Discontinued     500 mg 100 mL/hr over 60 Minutes Intravenous Every 8 hours 09/03/14 2324 09/05/14 1428   09/03/14 2200  piperacillin-tazobactam (ZOSYN) IVPB 3.375 g  Status:  Discontinued     3.375 g 12.5 mL/hr over 240 Minutes Intravenous 3 times per day 09/03/14 1801 09/03/14 2303   09/03/14 1500  metroNIDAZOLE (FLAGYL) tablet 500 mg  Status:  Discontinued     500 mg Oral 3 times per day 09/03/14 1423 09/03/14 1754   09/02/14 2000  vancomycin (VANCOCIN) 1,250 mg in sodium chloride 0.9 % 250 mL IVPB  Status:  Discontinued     1,250 mg 166.7 mL/hr over 90 Minutes Intravenous Every 12 hours 09/02/14 0850 09/03/14 1754   09/02/14 1930  acyclovir (ZOVIRAX) 775 mg in dextrose 5 % 150 mL IVPB  Status:  Discontinued     775 mg 165.5 mL/hr over 60 Minutes Intravenous 3 times per day 09/02/14 1901 09/03/14 1754   09/02/14 1400  piperacillin-tazobactam (ZOSYN) IVPB 3.375 g  Status:  Discontinued     3.375 g 12.5 mL/hr over 240 Minutes Intravenous 3 times per day 09/02/14 0749 09/02/14 1209   09/02/14 1400  cefTAZidime (FORTAZ) 2 g in dextrose 5 % 50 mL IVPB  Status:  Discontinued     2 g 100 mL/hr over 30 Minutes Intravenous 3 times per day  09/02/14 1210 09/03/14 1754   09/02/14 0800  vancomycin (VANCOCIN) 1,250 mg in sodium chloride 0.9 % 250 mL IVPB     1,250 mg 166.7 mL/hr over 90 Minutes Intravenous STAT 09/02/14 0752 09/02/14 0930   09/02/14 0745  piperacillin-tazobactam (ZOSYN) IVPB 3.375 g     3.375 g 100 mL/hr over 30 Minutes Intravenous  Once 09/02/14 0737 09/02/14 0815   09/02/14 0745  vancomycin (VANCOCIN) IVPB 1000 mg/200 mL premix  Status:  Discontinued     1,000 mg 200 mL/hr over 60 Minutes Intravenous  Once 09/02/14 0737 09/02/14 0746      Medications: Scheduled Meds: .  stroke: mapping our early stages of recovery book   Does not apply Once  . allopurinol  300 mg Oral Daily  . antiseptic oral rinse  7 mL Mouth Rinse BID  . aspirin  81 mg Oral Daily  . atorvastatin  80 mg Oral q1800  . carvedilol  6.25 mg Oral BID WC  . feeding supplement (PRO-STAT 64)  30 mL Oral BID BM  . fentaNYL  50 mcg Transdermal Q72H  . heparin subcutaneous  5,000 Units Subcutaneous 3 times per day  . levofloxacin (LEVAQUIN) IV  750 mg Intravenous Q24H  . pantoprazole  80 mg Oral Q1200  . potassium chloride  10 mEq Oral Daily  . pregabalin  75 mg Oral BID  . sodium chloride  3 mL Intravenous Q12H   Continuous Infusions: . sodium chloride 50 mL/hr at 09/05/14 0600  . amiodarone 30 mg/hr (09/05/14 1016)  . norepinephrine (LEVOPHED) Adult infusion Stopped (09/04/14 1630)   PRN Meds:.sodium chloride, [DISCONTINUED] acetaminophen **OR** acetaminophen, acetaminophen, diphenhydrAMINE, gi cocktail, loperamide, ondansetron (ZOFRAN) IV, oxyCODONE, sodium chloride, sodium chloride    Objective: Weight change:   Intake/Output Summary (Last 24 hours) at 09/05/14 1538 Last data filed at 09/05/14 1400  Gross per 24 hour  Intake 3074.88 ml  Output   2100 ml  Net 974.88 ml   Blood pressure 118/57, pulse 108, temperature 98.3 F (36.8 C), temperature source Oral, resp. rate 26, height 6' (1.829 m), weight 218 lb 4.1 oz (99 kg), SpO2  79 %. Temp:  [98 F (36.7 C)-103.2 F (39.6 C)] 98.3 F (36.8 C) (06/24 1209) Resp:  [13-26] 26 (06/24 1414) BP: (85-185)/(19-92) 118/57 mmHg (06/24 1414) SpO2:  [77 %-100 %] 79 % (06/24 1414)  Physical Exam: General: Alert and awake, oriented x3, not in any acute distress but slightly flushed HEENT: anicteric sclera, pupils reactive to light and accommodation, EOMI, oropharynx clear and without exudate CVS tachy rate, normal r, no murmur rubs or gallops Chest: clear to auscultation bilaterally, no wheezing, rales or rhonchi Abdomen: soft nontender, nondistended, normal bowel sounds, Extremities:  Petechial rash in lower legs, more Macular papularon arms and chest all WORSE  pics from 09/04/14:           PICS from today 09/05/14:             Neuro: nonfocal, strength and sensation intact  CBC: CBC Latest Ref Rng 09/05/2014 09/04/2014 09/03/2014  WBC 4.0 - 10.5 K/uL 4.2 2.3(L) 1.9(L)  Hemoglobin 13.0 - 17.0 g/dL 8.2(L) 8.0(L) 8.1(L)  Hematocrit 39.0 - 52.0 % 25.0(L) 24.1(L) 25.0(L)  Platelets 150 - 400 K/uL 95(L) 95(L) 92(L)       BMET  Recent Labs  09/04/14 0558 09/05/14 0500  NA 133* 133*  K 3.6 3.7  CL 105 107  CO2 19* 18*  GLUCOSE 121* 145*  BUN 17 16  CREATININE 1.53* 1.55*  CALCIUM 7.5* 7.8*     Liver Panel   Recent Labs  09/04/14 0558 09/05/14 0500  PROT 7.6 7.8  ALBUMIN 2.3* 2.2*  AST 24 27  ALT 40 36  ALKPHOS 85 91  BILITOT 0.8 0.6       Sedimentation Rate No results for input(s): ESRSEDRATE in the last 72 hours. C-Reactive Protein No results for input(s): CRP in the last 72 hours.  Micro Results: Recent Results (from the past 720 hour(s))  Culture, blood (routine x 2)     Status: None (Preliminary result)   Collection Time: 09/02/14  8:10 AM  Result Value Ref Range Status   Specimen Description BLOOD RIGHT ARM  Final   Special Requests BOTTLES DRAWN AEROBIC AND ANAEROBIC 10CC  Final   Culture   Final    NO  GROWTH 3 DAYS Performed at [facility]    Report Status PENDING  Incomplete  Culture, blood (routine x 2)     Status: None (Preliminary result)   Collection Time: 09/02/14  8:15 AM  Result Value Ref Range Status   Specimen Description BLOOD RIGHT HAND  Final   Special Requests BOTTLES DRAWN AEROBIC AND  ANAEROBIC 5CC  Final   Culture  Setup Time   Final    GRAM NEGATIVE RODS ANAEROBIC BOTTLE ONLY CRITICAL RESULT CALLED TO, READ BACK BY AND VERIFIED WITH: Nehemiah Settle RN @ [phone] ON [id number] BY Rhea Bleacher    Culture   Final    SALMONELLA SPECIES CRITICAL RESULT CALLED TO, READ BACK BY AND VERIFIED WITH: R JOHNSON 09/05/14 @ 0951 M VESTAL Performed at [facility]    Report Status PENDING  Incomplete   Organism ID, Bacteria SALMONELLA SPECIES  Final      Susceptibility   Salmonella species - MIC*    AMPICILLIN <=2 SENSITIVE Sensitive     LEVOFLOXACIN <=0.12 SENSITIVE Sensitive     TRIMETH/SULFA <=20 SENSITIVE Sensitive     * SALMONELLA SPECIES  MRSA PCR Screening     Status: None   Collection Time: 09/02/14  3:35 PM  Result Value Ref Range Status   MRSA by PCR NEGATIVE NEGATIVE Final    Comment:        The GeneXpert MRSA Assay (FDA approved for NASAL specimens only), is one component of a comprehensive MRSA colonization surveillance program. It is not intended to diagnose MRSA infection nor to guide or monitor treatment for MRSA infections.   CSF culture with Stat gram stain     Status: None (Preliminary result)   Collection Time: 09/02/14  5:20 PM  Result Value Ref Range Status   Specimen Description CSF  Final   Special Requests Immunocompromised  Final   Gram Stain   Final    CYTOSPIN SMEAR NO WBC SEEN NO ORGANISMS SEEN Gram Stain Report Called to,Read Back By and Verified With: PAM Margarito Courser [id number] @ 1924 BY J SCOTTON    Culture   Final    NO GROWTH 2 DAYS Performed at [facility]    Report Status PENDING  Incomplete  AFB culture with smear      Status: None (Preliminary result)   Collection Time: 09/02/14  5:20 PM  Result Value Ref Range Status   Specimen Description CSF  Final   Special Requests Immunocompromised  Final   Acid Fast Smear   Final    NO ACID FAST BACILLI SEEN Performed at [facility]    Culture   Final    CULTURE WILL BE EXAMINED FOR 6 WEEKS BEFORE ISSUING A FINAL REPORT Performed at [facility]    Report Status PENDING  Incomplete  Fungus Culture with Smear     Status: None (Preliminary result)   Collection Time: 09/02/14  5:20 PM  Result Value Ref Range Status   Specimen Description CSF  Final   Special Requests Immunocompromised  Final   Fungal Smear   Final    NO YEAST OR FUNGAL ELEMENTS SEEN Performed at [facility]    Culture   Final    CULTURE IN PROGRESS FOR FOUR WEEKS Performed at [facility]    Report Status PENDING  Incomplete  Gram stain     Status: None   Collection Time: 09/02/14  5:20 PM  Result Value Ref Range Status   Specimen Description CSF  Final   Special Requests NONE  Final   Gram Stain   Final    NO ORGANISMS SEEN NO WBC SEEN CYTOSPUN SMEAR Gram Stain Report Called to,Read Back By and Verified With: PAM WEST,RN [id number] @ 1924 BY J SCOTTON    Report Status 09/02/2014 FINAL  Final  Clostridium Difficile by PCR (not at  [place])     Status: None   Collection Time: 09/05/14  2:53 AM  Result Value Ref Range Status   C difficile by pcr NEGATIVE NEGATIVE Final    Studies/Results: Dg Abd 1 View  09/05/2014   CLINICAL DATA:  Ongoing nausea, vomiting since last night. Mid abdominal discomfort.  EXAM: ABDOMEN - 1 VIEW  COMPARISON:  None.  FINDINGS: Nonobstructive bowel gas pattern. No supine evidence of free air. No organomegaly or suspicious calcification. No acute bony abnormality. Visualized lung bases clear.  IMPRESSION: No acute findings.   Electronically Signed   By: Rolm Baptise M.D.   On: 09/05/2014 11:06       Assessment/Plan:  Principal Problem:   Chest pain Active Problems:   Multiple myeloma   Essential hypertension   CKD (chronic kidney disease) stage 3, GFR 30-59 ml/min   GERD (gastroesophageal reflux disease)   Abdominal pain, chronic, epigastric   Cancer   FUO (fever of unknown origin)   Lethargy   Tachycardia   Neutropenic fever   Screen for STD (sexually transmitted disease)   Aspergillus pneumonia   Dyspnea   Gram-negative bacteremia   Drug rash   Paroxysmal atrial fibrillation    Walter Dennis is a 64 y.o. 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  I am also DC flagyl since he was on this when he first developed the rash   #3 ARF; I have dc nephrotoxic abx including vanco, acylcovir but he was also getting high dose ibuprofen 854m tid and ARB--and I have taken liberty of stopping both of these  #4 Ttpenia: expect due to chemotherapy, sepsis   Dr. CLinus Salmonsis covering this weekend.        LOS: 4 days   CAlcide Evener6/24/2016, 3:38 PM

## 2014-09-05 NOTE — Progress Notes (Signed)
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  He started bendamustine and pomalidomide on 08/29/2014.  He has history of HTN.  Per patient, he has had fevers, chills, night sweats attributed to his MM for the last year.  He will rigor and have associated SOB which typically resolves on its own.  Separate from these symptoms, he has had intermittent severe exertional chest pain which radiates to both shoulders which is severe and associated with SOB, mild nausea, significant lightheadedness and diaphoresis.  Over the last few weeks, he has had difficulty walking up a flight of stairs before becoming SOB.  The last few days, he cannot walk across a room before he develops chest pain.  Pain is relieved by rest.  He had a CT scan which demonstrated no evidence of pneumonia or PE but did demonstrated three vessel calcifications.  Per oncology, his chemotherapy does not typically cause myocarditis or pericarditis or acute heart failure.  Cardiology has been consulted for possible unstable angina.  I have notified oncology at [facility] of patient's admission and Dr. Benay Spice has been added to the rounding list here at [facility].    Assessment/Plan   Sepsis with fever, tachycardia, tachypnea, AMS in setting of MM and rapidly dropping WBC count concerning for febrile neutropenia.  Had history of aspergillosis infection during stem cell transplant in 2009 and has been immunosuppressed for long time due to progressive myeloma and chemotherapy - ID and Neurology on board and assisting with management. - Antiinfective regimen managed by ID currently - Lactic acid level WNL currently - Gram - rod on blood culture  sensitivity pending.  Gram negative bacteremia - Salmonella species per my discussion with nursing - Pt currently on Levaquin - continue supportive therapy   Atrial fibrillation - Patient started on Cardizem drip - Cardiology on board currently - Administer fluid bolus  Chest pain concerning for unstable angina.  He has no known plasmacytomas in the region.  DDx includes perimyocarditis give mildly elevated troponins.  Given calcifications on CT and longstanding HTN, unstable angina is concerning.  He had neg cath 4 years ago and negative myoview one year ago.  -  Telemetry with cardiology on board and assisting with management. 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  He started bendamustine and pomalidomide on 08/29/2014.  -  Agree with holding chemotherapy pending further investigation for infection -  Dr. SBenay Spicefollowing currently   Pancytopenia due to MM and chemotherapy -  WBC steady with mild increase on last check - oncology on board.    Hyponatremia may be due to poor oral solute intake -  Stable at 133  Acute urinary retention, foley catheter placed  Diet:  NPO Access:  PIV IVF:  resume Proph: heparin  Code Status: full Family Communication: patient  Disposition Plan: stepdown unit   Consultants:  Cardiology  ID  Neurology  Procedures:  CT angio chest  Antibiotics:  levaquin  Metronidazole  HPI/Subjective: Pt has no new complaints currently. Denies any chest pain or shortness of breath  Objective: Filed Vitals:   09/05/14 0600 09/05/14 0700 09/05/14 0729 09/05/14 0800  BP: 185/45 165/62 137/47   Pulse:       Temp:  103.2 F (39.6 C)  100.6 F (38.1 C)  TempSrc:  Oral  Oral  Resp: 21 21 23    Height:      Weight:      SpO2: 100% 95% 100%     Intake/Output Summary (Last 24 hours) at 09/05/14 1003 Last data filed at 09/05/14 0800  Gross per 24 hour  Intake 2438.53 ml  Output   1550 ml  Net 888.53 ml   Filed Weights   09/01/14 1902 09/02/14 1300 09/03/14 0400  Weight: 99.791 kg (220 lb) 102.5 kg (225 lb 15.5 oz) 99 kg (218 lb 4.1 oz)    Exam: Initial:    General:  Pt alert and awake, in NAD  HEENT:  NCAT, MMM  Cardiovascular:  Irregularly irregular, nl S1, S2 no mrg, 2+ pulses, warm extremities  Respiratory:  CTAB, no wheezes  Abdomen:   NABS, soft, NT/ND  MSK:   Normal tone and bulk, no LEE  Neuro:  Answers question appropriately no facial asymmetry, moves extremities equally   Data Reviewed: Basic Metabolic Panel:  Recent Labs Lab 09/02/14 0810 09/03/14 0430 09/03/14 2350 09/04/14 0558 09/05/14 0500  NA 133* 133* 133* 133* 133*  K 3.6 3.2* 2.9* 3.6 3.7  CL 98* 102 106 105 107  CO2 24 22 19* 19* 18*  GLUCOSE 120* 170* 152* 121* 145*  BUN 18 21* 17 17 16   CREATININE 1.13 1.56* 1.47* 1.53* 1.55*  CALCIUM 7.7* 6.7* 7.1* 7.5* 7.8*  MG  --  1.4*  --  2.4 2.0  PHOS  --   --   --  2.0*  --    Liver Function Tests:  Recent Labs Lab 09/02/14 0810 09/03/14 0430 09/04/14 0558 09/05/14 0500  AST 17 35 24 27  ALT 39 45 40 36  ALKPHOS 78 86 85 91  BILITOT 0.8 1.0 0.8 0.6  PROT 9.1* 7.8 7.6 7.8  ALBUMIN 2.8* 2.3* 2.3* 2.2*   No results for input(s): LIPASE, AMYLASE in the last 168 hours.  Recent Labs Lab 09/03/14 0510  AMMONIA 44*   CBC:  Recent Labs Lab 09/01/14 1359 09/02/14 0810 09/03/14 0430 09/04/14 0558 09/05/14 0500  WBC 2.4* 1.8* 1.9* 2.3* 4.2  NEUTROABS  --  1.3* 1.5* 1.9 3.3  HGB 9.3* 9.0* 8.1* 8.0* 8.2*  HCT 28.7* 28.2* 25.0* 24.1* 25.0*  MCV 89.4 88.1 88.3 87.3 87.4  PLT 109* 105* 92* 95* 95*   Cardiac Enzymes:  Recent  Labs Lab 09/01/14 2005 09/01/14 2300 09/02/14 0134  TROPONINI 0.08* 0.09* 0.09*   BNP (last 3 results)  Recent Labs  09/01/14 1359  BNP 452.2*    ProBNP (last 3 results) No results for input(s): PROBNP in the last 8760 hours.  CBG: No results for input(s): GLUCAP in the last 168 hours.  Recent Results (from the past 240 hour(s))  Culture, blood (routine x 2)     Status: None (Preliminary result)   Collection Time: 09/02/14  8:10 AM  Result Value Ref Range Status   Specimen Description BLOOD RIGHT ARM  Final   Special Requests BOTTLES DRAWN AEROBIC AND ANAEROBIC 10CC  Final   Culture   Final    NO GROWTH 2 DAYS Performed at [facility]  Report Status PENDING  Incomplete  Culture, blood (routine x 2)     Status: None (Preliminary result)   Collection Time: 09/02/14  8:15 AM  Result Value Ref Range Status   Specimen Description BLOOD RIGHT HAND  Final   Special Requests BOTTLES DRAWN AEROBIC AND ANAEROBIC 5CC  Final   Culture  Setup Time   Final    GRAM NEGATIVE RODS ANAEROBIC BOTTLE ONLY CRITICAL RESULT CALLED TO, READ BACK BY AND VERIFIED WITH: Nehemiah Settle RN @ [phone] ON [id number] BY Rhea Bleacher    Culture   Final    Lonell Grandchild NEGATIVE RODS Performed at [facility]    Report Status PENDING  Incomplete  MRSA PCR Screening     Status: None   Collection Time: 09/02/14  3:35 PM  Result Value Ref Range Status   MRSA by PCR NEGATIVE NEGATIVE Final    Comment:        The GeneXpert MRSA Assay (FDA approved for NASAL specimens only), is one component of a comprehensive MRSA colonization surveillance program. It is not intended to diagnose MRSA infection nor to guide or monitor treatment for MRSA infections.   CSF culture with Stat gram stain     Status: None (Preliminary result)   Collection Time: 09/02/14  5:20 PM  Result Value Ref Range Status   Specimen Description CSF  Final   Special Requests Immunocompromised  Final   Gram Stain   Final     CYTOSPIN SMEAR NO WBC SEEN NO ORGANISMS SEEN Gram Stain Report Called to,Read Back By and Verified With: PAM Margarito Courser [id number] @ 1924 BY J SCOTTON    Culture   Final    NO GROWTH 1 DAY Performed at [facility]    Report Status PENDING  Incomplete  AFB culture with smear     Status: None (Preliminary result)   Collection Time: 09/02/14  5:20 PM  Result Value Ref Range Status   Specimen Description CSF  Final   Special Requests Immunocompromised  Final   Acid Fast Smear   Final    NO ACID FAST BACILLI SEEN Performed at [facility]    Culture   Final    CULTURE WILL BE EXAMINED FOR 6 WEEKS BEFORE ISSUING A FINAL REPORT Performed at [facility]    Report Status PENDING  Incomplete  Fungus Culture with Smear     Status: None (Preliminary result)   Collection Time: 09/02/14  5:20 PM  Result Value Ref Range Status   Specimen Description CSF  Final   Special Requests Immunocompromised  Final   Fungal Smear   Final    NO YEAST OR FUNGAL ELEMENTS SEEN Performed at [facility]    Culture   Final    CULTURE IN PROGRESS FOR FOUR WEEKS Performed at [facility]    Report Status PENDING  Incomplete  Gram stain     Status: None   Collection Time: 09/02/14  5:20 PM  Result Value Ref Range Status   Specimen Description CSF  Final   Special Requests NONE  Final   Gram Stain   Final    NO ORGANISMS SEEN NO WBC SEEN CYTOSPUN SMEAR Gram Stain Report Called to,Read Back By and Verified With: PAM WEST,RN [id number] @ 1924 BY J SCOTTON    Report Status 09/02/2014 FINAL  Final  Clostridium Difficile by PCR (not at [facility])     Status: None   Collection Time: 09/05/14  2:53 AM  Result  Value Ref Range Status   C difficile by pcr NEGATIVE NEGATIVE Final     Studies: No results found.  Scheduled Meds: .  stroke: mapping our early stages of recovery book   Does not apply Once  . allopurinol  300 mg Oral Daily  . antiseptic oral rinse  7 mL Mouth Rinse  BID  . aspirin  81 mg Oral Daily  . atorvastatin  80 mg Oral q1800  . carvedilol  6.25 mg Oral BID WC  . doxycycline (VIBRAMYCIN) IV  100 mg Intravenous Q12H  . feeding supplement (PRO-STAT 64)  30 mL Oral BID BM  . fentaNYL  50 mcg Transdermal Q72H  . heparin subcutaneous  5,000 Units Subcutaneous 3 times per day  . levofloxacin (LEVAQUIN) IV  750 mg Intravenous Q24H  . metronidazole  500 mg Intravenous Q8H  . pantoprazole  80 mg Oral Q1200  . potassium chloride  10 mEq Oral Daily  . pregabalin  75 mg Oral BID  . sodium chloride  3 mL Intravenous Q12H   Continuous Infusions: . sodium chloride 50 mL/hr at 09/05/14 0600  . amiodarone 30 mg/hr (09/05/14 0600)  . norepinephrine (LEVOPHED) Adult infusion Stopped (09/04/14 1630)    Principal Problem:   Chest pain Active Problems:   Multiple myeloma   Essential hypertension   CKD (chronic kidney disease) stage 3, GFR 30-59 ml/min   GERD (gastroesophageal reflux disease)   Abdominal pain, chronic, epigastric   Cancer   FUO (fever of unknown origin)   Lethargy   Tachycardia   Neutropenic fever   Screen for STD (sexually transmitted disease)   Aspergillus pneumonia   Dyspnea   Gram-negative bacteremia   Drug rash    Time spent: 35 min    Velvet Bathe  [facility] Pager [phone] If 7PM-7AM, please contact night-coverage at [email or web address], password [facility] 09/05/2014, 10:03 AM  LOS: 4 days

## 2014-09-06 ENCOUNTER — Encounter (HOSPITAL_COMMUNITY)
Admission: EM | Disposition: A | Discharge status: Home Health | Payer: Self-pay | Source: Home / Self Care | Attending: Family Medicine

## 2014-09-06 ENCOUNTER — Inpatient Hospital Stay (HOSPITAL_COMMUNITY): Payer: BLUE CROSS/BLUE SHIELD | Admitting: Certified Registered Nurse Anesthetist

## 2014-09-06 DIAGNOSIS — R1013 Epigastric pain: Secondary | ICD-10-CM

## 2014-09-06 DIAGNOSIS — G8929 Other chronic pain: Secondary | ICD-10-CM

## 2014-09-06 HISTORY — PX: PORT-A-CATH REMOVAL: SHX5289

## 2014-09-06 LAB — CBC WITH DIFFERENTIAL/PLATELET
BASOS ABS: 0 10*3/uL (ref 0.0–0.1)
BASOS PCT: 1 % (ref 0–1)
Eosinophils Absolute: 0.2 10*3/uL (ref 0.0–0.7)
Eosinophils Relative: 4 % (ref 0–5)
HCT: 23.5 % — ABNORMAL LOW (ref 39.0–52.0)
HEMOGLOBIN: 7.6 g/dL — AB (ref 13.0–17.0)
LYMPHS PCT: 20 % (ref 12–46)
Lymphs Abs: 1 10*3/uL (ref 0.7–4.0)
MCH: 28 pg (ref 26.0–34.0)
MCHC: 32.3 g/dL (ref 30.0–36.0)
MCV: 86.7 fL (ref 78.0–100.0)
MONO ABS: 0.4 10*3/uL (ref 0.1–1.0)
Monocytes Relative: 9 % (ref 3–12)
NEUTROS ABS: 3.2 10*3/uL (ref 1.7–7.7)
NEUTROS PCT: 66 % (ref 43–77)
Platelets: 105 10*3/uL — ABNORMAL LOW (ref 150–400)
RBC: 2.71 MIL/uL — ABNORMAL LOW (ref 4.22–5.81)
RDW: 18.6 % — AB (ref 11.5–15.5)
WBC: 4.8 10*3/uL (ref 4.0–10.5)

## 2014-09-06 LAB — CSF CULTURE W GRAM STAIN: Culture: NO GROWTH

## 2014-09-06 LAB — COMPREHENSIVE METABOLIC PANEL
ALBUMIN: 2.1 g/dL — AB (ref 3.5–5.0)
ALT: 32 U/L (ref 17–63)
ANION GAP: 8 (ref 5–15)
AST: 19 U/L (ref 15–41)
Alkaline Phosphatase: 100 U/L (ref 38–126)
BILIRUBIN TOTAL: 1 mg/dL (ref 0.3–1.2)
BUN: 21 mg/dL — ABNORMAL HIGH (ref 6–20)
CHLORIDE: 113 mmol/L — AB (ref 101–111)
CO2: 18 mmol/L — AB (ref 22–32)
Calcium: 8.4 mg/dL — ABNORMAL LOW (ref 8.9–10.3)
Creatinine, Ser: 1.84 mg/dL — ABNORMAL HIGH (ref 0.61–1.24)
GFR calc Af Amer: 44 mL/min — ABNORMAL LOW (ref >60)
GFR calc non Af Amer: 38 mL/min — ABNORMAL LOW (ref >60)
Glucose, Bld: 121 mg/dL — ABNORMAL HIGH (ref 65–99)
Potassium: 3.6 mmol/L (ref 3.5–5.1)
SODIUM: 139 mmol/L (ref 135–145)
TOTAL PROTEIN: 7.6 g/dL (ref 6.5–8.1)

## 2014-09-06 LAB — MAGNESIUM: Magnesium: 2.1 mg/dL (ref 1.7–2.4)

## 2014-09-06 LAB — CSF CULTURE

## 2014-09-06 LAB — PROTIME-INR
INR: 1.35 (ref 0.00–1.49)
PROTHROMBIN TIME: 16.8 s — AB (ref 11.6–15.2)

## 2014-09-06 SURGERY — REMOVAL PORT-A-CATH
Anesthesia: Monitor Anesthesia Care | Site: Chest | Laterality: Right

## 2014-09-06 MED ORDER — LIDOCAINE HCL 1 % IJ SOLN
INTRAMUSCULAR | Status: AC
  Filled 2014-09-06: qty 20

## 2014-09-06 MED ORDER — PROPOFOL 10 MG/ML IV BOLUS
INTRAVENOUS | Status: AC
  Filled 2014-09-06: qty 20

## 2014-09-06 MED ORDER — 0.9 % SODIUM CHLORIDE (POUR BTL) OPTIME
TOPICAL | Status: DC | PRN
  Administered 2014-09-06: 1000 mL

## 2014-09-06 MED ORDER — MIDAZOLAM HCL 5 MG/5ML IJ SOLN
INTRAMUSCULAR | Status: DC | PRN
  Administered 2014-09-06: 2 mg via INTRAVENOUS

## 2014-09-06 MED ORDER — LIDOCAINE HCL (CARDIAC) 20 MG/ML IV SOLN
INTRAVENOUS | Status: DC | PRN
  Administered 2014-09-06: 100 mg via INTRAVENOUS

## 2014-09-06 MED ORDER — HYDROMORPHONE HCL 1 MG/ML IJ SOLN: 0.2500 mg | INTRAMUSCULAR | Status: DC | PRN

## 2014-09-06 MED ORDER — PROPOFOL INFUSION 10 MG/ML OPTIME
INTRAVENOUS | Status: DC | PRN
  Administered 2014-09-06: 200 ug/kg/min via INTRAVENOUS

## 2014-09-06 MED ORDER — LACTATED RINGERS IV SOLN
INTRAVENOUS | Status: DC | PRN
  Administered 2014-09-06: 08:00:00 via INTRAVENOUS

## 2014-09-06 MED ORDER — FENTANYL CITRATE (PF) 100 MCG/2ML IJ SOLN
INTRAMUSCULAR | Status: AC
  Filled 2014-09-06: qty 2

## 2014-09-06 MED ORDER — ONDANSETRON HCL 4 MG/2ML IJ SOLN
INTRAMUSCULAR | Status: AC
  Filled 2014-09-06: qty 2

## 2014-09-06 MED ORDER — LIDOCAINE HCL (CARDIAC) 20 MG/ML IV SOLN
INTRAVENOUS | Status: AC
  Filled 2014-09-06: qty 5

## 2014-09-06 MED ORDER — PROMETHAZINE HCL 25 MG/ML IJ SOLN
25.0000 mg | Freq: Four times a day (QID) | INTRAMUSCULAR | Status: DC | PRN
  Filled 2014-09-06 (×2): qty 1

## 2014-09-06 MED ORDER — ONDANSETRON HCL 4 MG/2ML IJ SOLN
4.0000 mg | Freq: Once | INTRAMUSCULAR | Status: DC
Start: 2014-09-06 — End: 2014-09-07

## 2014-09-06 MED ORDER — FENTANYL CITRATE (PF) 100 MCG/2ML IJ SOLN
INTRAMUSCULAR | Status: DC | PRN
  Administered 2014-09-06: 25 ug via INTRAVENOUS
  Administered 2014-09-06: 50 ug via INTRAVENOUS
  Administered 2014-09-06: 25 ug via INTRAVENOUS

## 2014-09-06 MED ORDER — BUPIVACAINE HCL (PF) 0.5 % IJ SOLN
INTRAMUSCULAR | Status: AC
  Filled 2014-09-06: qty 30

## 2014-09-06 MED ORDER — PROMETHAZINE HCL 25 MG/ML IJ SOLN: 6.2500 mg | INTRAMUSCULAR | Status: DC | PRN

## 2014-09-06 MED ORDER — LIDOCAINE HCL 1 % IJ SOLN
INTRAMUSCULAR | Status: DC | PRN
  Administered 2014-09-06: 22 mL

## 2014-09-06 MED ORDER — MIDAZOLAM HCL 2 MG/2ML IJ SOLN
INTRAMUSCULAR | Status: AC
  Filled 2014-09-06: qty 2

## 2014-09-06 SURGICAL SUPPLY — 29 items
BANDAGE ADH SHEER 1  50/CT (GAUZE/BANDAGES/DRESSINGS) IMPLANT
BENZOIN TINCTURE PRP APPL 2/3 (GAUZE/BANDAGES/DRESSINGS) IMPLANT
BLADE SURG 15 STRL LF DISP TIS (BLADE) ×1 IMPLANT
BLADE SURG 15 STRL SS (BLADE) ×2
CHLORAPREP W/TINT 26ML (MISCELLANEOUS) IMPLANT
CLOSURE WOUND 1/2 X4 (GAUZE/BANDAGES/DRESSINGS)
DECANTER SPIKE VIAL GLASS SM (MISCELLANEOUS) ×6 IMPLANT
DRAPE LAPAROTOMY TRNSV 102X78 (DRAPE) ×3 IMPLANT
ELECT CAUTERY BLADE 6.4 (BLADE) ×3 IMPLANT
ELECT REM PT RETURN 9FT ADLT (ELECTROSURGICAL) ×3
ELECTRODE REM PT RTRN 9FT ADLT (ELECTROSURGICAL) ×1 IMPLANT
GLOVE SURG SIGNA 7.5 PF LTX (GLOVE) ×3 IMPLANT
GOWN STRL REUS W/TWL XL LVL3 (GOWN DISPOSABLE) ×6 IMPLANT
KIT BASIN OR (CUSTOM PROCEDURE TRAY) ×3 IMPLANT
LIQUID BAND (GAUZE/BANDAGES/DRESSINGS) ×3 IMPLANT
NEEDLE HYPO 22GX1.5 SAFETY (NEEDLE) ×3 IMPLANT
PACK BASIC VI WITH GOWN DISP (CUSTOM PROCEDURE TRAY) ×3 IMPLANT
PENCIL BUTTON HOLSTER BLD 10FT (ELECTRODE) ×3 IMPLANT
SOL PREP POV-IOD 4OZ 10% (MISCELLANEOUS) ×3 IMPLANT
SPONGE LAP 4X18 X RAY DECT (DISPOSABLE) ×3 IMPLANT
STRIP CLOSURE SKIN 1/2X4 (GAUZE/BANDAGES/DRESSINGS) IMPLANT
SUT MNCRL AB 4-0 PS2 18 (SUTURE) ×3 IMPLANT
SUT VIC AB 3-0 SH 27 (SUTURE)
SUT VIC AB 3-0 SH 27XBRD (SUTURE) IMPLANT
SYR 20CC LL (SYRINGE) IMPLANT
SYR BULB IRRIGATION 50ML (SYRINGE) ×3 IMPLANT
TOWEL OR 17X26 10 PK STRL BLUE (TOWEL DISPOSABLE) ×3 IMPLANT
TOWEL OR NON WOVEN STRL DISP B (DISPOSABLE) ×3 IMPLANT
YANKAUER SUCT BULB TIP 10FT TU (MISCELLANEOUS) IMPLANT

## 2014-09-06 NOTE — Op Note (Signed)
REMOVAL INFECTED PORT-A-CATH  Procedure Note  Stafford Riviera 09/01/2014 - 09/06/2014   Pre-op Diagnosis: Infected port a cath     Post-op Diagnosis: same  Procedure(s): REMOVAL INFECTED PORT-A-CATH  Surgeon(s): Coralie Keens, MD  Anesthesia: Monitor Anesthesia Care  Staff:  Circulator: Maren Reamer, RN Scrub Person: Gregor Hams, CST Circulator Assistant: Roselind Rily, CST  Estimated Blood Loss: Minimal                         Marcene Laskowski A   Date: 09/06/2014  Time: 8:36 AM

## 2014-09-06 NOTE — Anesthesia Preprocedure Evaluation (Addendum)
Anesthesia Evaluation  Patient identified by MRN, date of birth, ID band Patient awake    Reviewed: Allergy & Precautions, NPO status , Patient's Chart, lab work & pertinent test results  History of Anesthesia Complications (+) history of anesthetic complications  Airway Mallampati: II  TM Distance: >3 FB     Dental  (+) Teeth Intact, Dental Advisory Given   Pulmonary shortness of breath, former smoker,    Pulmonary exam normal       Cardiovascular hypertension, +CHF + dysrhythmias Atrial Fibrillation Rhythm:Irregular Rate:Tachycardia  Left ventricle: Abnormal septal motion Inferobasal hypokinesis The cavity size was normal. Wall thickness was normal. Systolic function was mildly reduced. The estimated ejection fraction was in the range of 45% to 50%. - Left atrium: The atrium was mildly dilated.    Neuro/Psych negative neurological ROS  negative psych ROS   GI/Hepatic Neg liver ROS, GERD-  ,  Endo/Other  negative endocrine ROS  Renal/GU Renal InsufficiencyRenal disease     Musculoskeletal   Abdominal   Peds  Hematology Multiple Myeloma, S/P stem cell transplant 2009   Anesthesia Other Findings   Reproductive/Obstetrics                        Anesthesia Physical Anesthesia Plan  ASA: III and emergent  Anesthesia Plan: MAC   Post-op Pain Management:    Induction: Intravenous  Airway Management Planned: Simple Face Mask  Additional Equipment:   Intra-op Plan:   Post-operative Plan:   Informed Consent: I have reviewed the patients History and Physical, chart, labs and discussed the procedure including the risks, benefits and alternatives for the proposed anesthesia with the patient or authorized representative who has indicated his/her understanding and acceptance.   Dental advisory given and Consent reviewed with POA  Plan Discussed with: CRNA, Anesthesiologist and  Surgeon  Anesthesia Plan Comments:       Anesthesia Quick Evaluation

## 2014-09-06 NOTE — Transfer of Care (Signed)
Immediate Anesthesia Transfer of Care Note  Patient: Walter Dennis  Procedure(s) Performed: Procedure(s): REMOVAL INFECTED PORT-A-CATH (Right)  Patient Location: PACU  Anesthesia Type:MAC  Level of Consciousness:  sedated, patient cooperative and responds to stimulation  Airway & Oxygen Therapy:Patient Spontanous Breathing and Patient connected to face mask oxgen  Post-op Assessment:  Report given to PACU RN and Post -op Vital signs reviewed and stable  Post vital signs:  Reviewed and stable  Last Vitals:  Filed Vitals:   09/06/14 0500  BP:   Pulse:   Temp: 37.9 C  Resp:     Complications: No apparent anesthesia complications

## 2014-09-06 NOTE — Anesthesia Postprocedure Evaluation (Signed)
Anesthesia Post Note  Patient: Walter Dennis  Procedure(s) Performed: Procedure(s) (LRB): REMOVAL INFECTED PORT-A-CATH (Right)  Anesthesia type: MAC  Patient location: PACU  Post pain: Pain level controlled  Post assessment: Patient's Cardiovascular Status Stable  Last Vitals:  Filed Vitals:   09/06/14 1500  BP: 148/55  Pulse:   Temp:   Resp: 16    Post vital signs: Reviewed and stable  Level of consciousness: sedated  Complications: No apparent anesthesia complications Anesthesia Post Note  Patient: Walter Dennis  Procedure(s) Performed: Procedure(s) (LRB): REMOVAL INFECTED PORT-A-CATH (Right)  Anesthesia type: MAC  Patient location: PACU  Post pain: Pain level controlled  Post assessment: Patient's Cardiovascular Status Stable  Post vital signs: Reviewed and stable  Level of consciousness: sedated  Complications: No apparent anesthesia complications

## 2014-09-06 NOTE — Progress Notes (Signed)
[facility] PROGRESS NOTE  Walter Dennis WVP:[id number] DOB: Jul 29, 1950 DOA: 09/01/2014 PCP: Effie Berkshire A  Brief Summary from prior PN  The patient is a 64 yo M with history of multiple myeloma, IgG-lambda dx 2008 s/p autologous stem cell transplant 2009, most recently with disease progression on carfilzomib, lenalidomide, and dexamethasone.  He started bendamustine and pomalidomide on 08/29/2014.  He has history of HTN.  Per patient, he has had fevers, chills, night sweats attributed to his MM for the last year.  He will rigor and have associated SOB which typically resolves on its own.  Separate from these symptoms, he has had intermittent severe exertional chest pain which radiates to both shoulders which is severe and associated with SOB, mild nausea, significant lightheadedness and diaphoresis.  Over the last few weeks, he has had difficulty walking up a flight of stairs before becoming SOB.  The last few days, he cannot walk across a room before he develops chest pain.  Pain is relieved by rest.  He had a CT scan which demonstrated no evidence of pneumonia or PE but did demonstrated three vessel calcifications.  Per oncology, his chemotherapy does not typically cause myocarditis or pericarditis or acute heart failure.  Cardiology has been consulted for possible unstable angina.  I have notified oncology at [facility] of patient's admission and Dr. Benay Spice has been added to the rounding list here at [facility]    Assessment/Plan   Sepsis with fever, tachycardia, tachypnea, AMS in setting of MM and rapidly dropping WBC count concerning for febrile neutropenia.  Had history of aspergillosis infection during stem cell transplant in 2009 and has been immunosuppressed for long time due to progressive myeloma and chemotherapy - ID and Neurology on board and assisting with management. - Antiinfective regimen managed by ID currently - Lactic acid level WNL currently - Blood cultures growing Salmonella  species  Gram negative bacteremia - Salmonella species per my discussion with nursing - Pt currently on Levaquin - Discussed case with ID who recommended port a cath removal. Subsequently consulted General surgery. Would like to thank ID and General Surgery for their assistance. Port a Cath removed 09/06/14   Atrial fibrillation - Cardiology on board currently and managing - Administer fluid bolus  Chest pain concerning for unstable angina.  He has no known plasmacytomas in the region.  DDx includes perimyocarditis give mildly elevated troponins.  Given calcifications on CT and longstanding HTN, unstable angina is concerning.  He had neg cath 4 years ago and negative myoview one year ago.  -  Telemetry with cardiology on board and assisting with management. 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  He started bendamustine and pomalidomide on 08/29/2014.  -  Agree with holding chemotherapy pending further investigation for infection -  Oncology will be available if any questions arise for now they have signed off   Pancytopenia due to MM and chemotherapy -  WBC  Within normal limits - platelets trending up    Hyponatremia may be due to poor oral solute intake -  Stable at 133  Acute urinary retention, foley catheter placed  Diet:  NPO Access:  PIV IVF:  resume Proph: heparin  Code Status: full Family Communication: patient  Disposition Plan:  stepdown unit   Consultants:  Cardiology  ID  Neurology  General surgery  Procedures:  CT angio chest  Antibiotics:  levaquin  Metronidazole  HPI/Subjective: Pt returned from port a cath removal and is resting. Was  sedated this AM  Objective: Filed Vitals:   09/06/14 0845 09/06/14 0900 09/06/14 0915 09/06/14 0930  BP: 122/98 133/64  138/64  Pulse: 81 72    Temp: 98.9 F (37.2 C)  97.9 F (36.6 C)   TempSrc:      Resp: 22 20    Height:      Weight:      SpO2: 100% 100%      Intake/Output Summary (Last 24 hours) at 09/06/14 0958 Last data filed at 09/06/14 0849  Gross per 24 hour  Intake 3347.1 ml  Output   2500 ml  Net  847.1 ml   Filed Weights   09/01/14 1902 09/02/14 1300 09/03/14 0400  Weight: 99.791 kg (220 lb) 102.5 kg (225 lb 15.5 oz) 99 kg (218 lb 4.1 oz)    Exam: Initial:    General:  Pt sedated this am and resting comfortably  HEENT:  No masses on visual examination, MMM  Cardiovascular:  Irregularly irregular, nl S1, S2 no mrg, 2+ pulses, warm extremities  Respiratory:  CTAB, no wheezes  Abdomen:   NABS, soft, NT/ND  MSK:   Normal tone and bulk, no LEE  Neuro:  Resting comfortably, no facial asymetry   Data Reviewed: Basic Metabolic Panel:  Recent Labs Lab 09/03/14 0430 09/03/14 2350 09/04/14 0558 09/05/14 0500 09/06/14 0520  NA 133* 133* 133* 133* 139  K 3.2* 2.9* 3.6 3.7 3.6  CL 102 106 105 107 113*  CO2 22 19* 19* 18* 18*  GLUCOSE 170* 152* 121* 145* 121*  BUN 21* 17 17 16  21*  CREATININE 1.56* 1.47* 1.53* 1.55* 1.84*  CALCIUM 6.7* 7.1* 7.5* 7.8* 8.4*  MG 1.4*  --  2.4 2.0 2.1  PHOS  --   --  2.0*  --   --    Liver Function Tests:  Recent Labs Lab 09/02/14 0810 09/03/14 0430 09/04/14 0558 09/05/14 0500 09/06/14 0520  AST 17 35 24 27 19   ALT 39 45 40 36 32  ALKPHOS 78 86 85 91 100  BILITOT 0.8 1.0 0.8 0.6 1.0  PROT 9.1* 7.8 7.6 7.8 7.6  ALBUMIN 2.8* 2.3* 2.3* 2.2* 2.1*   No results for input(s): LIPASE, AMYLASE in the last 168 hours.  Recent Labs Lab 09/03/14 0510  AMMONIA 44*   CBC:  Recent Labs Lab 09/02/14 0810 09/03/14 0430 09/04/14 0558 09/05/14 0500 09/06/14 0520  WBC 1.8* 1.9* 2.3* 4.2 4.8  NEUTROABS 1.3* 1.5*  1.9 3.3 3.2  HGB 9.0* 8.1* 8.0* 8.2* 7.6*  HCT 28.2* 25.0* 24.1* 25.0* 23.5*  MCV 88.1 88.3 87.3 87.4 86.7  PLT 105* 92* 95* 95* 105*   Cardiac Enzymes:  Recent Labs Lab 09/01/14 2005 09/01/14 2300 09/02/14 0134  TROPONINI 0.08* 0.09* 0.09*   BNP (last 3 results)  Recent Labs  09/01/14 1359  BNP 452.2*    ProBNP (last 3 results) No results for input(s): PROBNP in the last 8760 hours.  CBG: No results for input(s): GLUCAP in the last 168 hours.  Recent Results (from the past 240 hour(s))  Culture, blood (routine x 2)     Status: None (Preliminary result)   Collection Time: 09/02/14  8:10 AM  Result Value Ref Range Status   Specimen Description BLOOD RIGHT ARM  Final   Special Requests BOTTLES DRAWN AEROBIC AND ANAEROBIC 10CC  Final   Culture   Final    NO GROWTH 3 DAYS Performed at [facility]    Report Status PENDING  Incomplete  Culture, blood (routine x 2)     Status: None (Preliminary result)   Collection Time: 09/02/14  8:15 AM  Result Value Ref Range Status   Specimen Description BLOOD RIGHT HAND  Final   Special Requests BOTTLES DRAWN AEROBIC AND ANAEROBIC 5CC  Final   Culture  Setup Time   Final    GRAM NEGATIVE RODS ANAEROBIC BOTTLE ONLY CRITICAL RESULT CALLED TO, READ BACK BY AND VERIFIED WITH: Nehemiah Settle RN @ [phone] ON [id number] BY Rhea Bleacher    Culture   Final    SALMONELLA SPECIES CRITICAL RESULT CALLED TO, READ BACK BY AND VERIFIED WITH: R JOHNSON 09/05/14 @ 0951 M VESTAL Performed at [facility]    Report Status PENDING  Incomplete   Organism ID, Bacteria SALMONELLA SPECIES  Final      Susceptibility   Salmonella species - MIC*    AMPICILLIN <=2 SENSITIVE Sensitive     LEVOFLOXACIN <=0.12 SENSITIVE Sensitive     TRIMETH/SULFA <=20 SENSITIVE Sensitive     * SALMONELLA SPECIES  MRSA PCR Screening     Status: None   Collection Time: 09/02/14  3:35 PM  Result Value Ref Range Status   MRSA by PCR NEGATIVE NEGATIVE Final     Comment:        The GeneXpert MRSA Assay (FDA approved for NASAL specimens only), is one component of a comprehensive MRSA colonization surveillance program. It is not intended to diagnose MRSA infection nor to guide or monitor treatment for MRSA infections.   CSF culture with Stat gram stain     Status: None (Preliminary result)   Collection Time: 09/02/14  5:20 PM  Result Value Ref Range Status   Specimen Description CSF  Final   Special Requests Immunocompromised  Final   Gram Stain   Final    CYTOSPIN SMEAR NO WBC SEEN NO ORGANISMS SEEN Gram Stain Report Called to,Read Back By and Verified With: PAM Margarito Courser [id number] @ 1924 BY J SCOTTON    Culture   Final    NO GROWTH 2 DAYS Performed at [facility]    Report Status PENDING  Incomplete  AFB culture with smear     Status: None (Preliminary result)   Collection Time: 09/02/14  5:20 PM  Result Value Ref Range Status   Specimen Description CSF  Final   Special Requests Immunocompromised  Final   Acid Fast Smear   Final    NO ACID FAST BACILLI SEEN Performed at [facility]    Culture   Final    CULTURE WILL BE EXAMINED FOR 6 WEEKS BEFORE ISSUING A FINAL REPORT Performed at [facility]    Report Status PENDING  Incomplete  Fungus Culture with Smear     Status: None (Preliminary result)   Collection Time: 09/02/14  5:20 PM  Result Value Ref Range Status   Specimen Description CSF  Final   Special Requests Immunocompromised  Final   Fungal Smear   Final    NO YEAST OR FUNGAL ELEMENTS SEEN Performed at [facility]    Culture   Final    CULTURE IN PROGRESS FOR FOUR WEEKS Performed at [facility]    Report Status PENDING  Incomplete  Gram stain  Status: None   Collection Time: 09/02/14  5:20 PM  Result Value Ref Range Status   Specimen Description CSF  Final   Special Requests NONE  Final   Gram Stain   Final    NO ORGANISMS SEEN NO WBC SEEN CYTOSPUN SMEAR Gram Stain  Report Called to,Read Back By and Verified With: PAM WEST,RN [id number] @ 1924 BY J SCOTTON    Report Status 09/02/2014 FINAL  Final  Clostridium Difficile by PCR (not at [facility])     Status: None   Collection Time: 09/05/14  2:53 AM  Result Value Ref Range Status   C difficile by pcr NEGATIVE NEGATIVE Final     Studies: Dg Abd 1 View  09/05/2014   CLINICAL DATA:  Ongoing nausea, vomiting since last night. Mid abdominal discomfort.  EXAM: ABDOMEN - 1 VIEW  COMPARISON:  None.  FINDINGS: Nonobstructive bowel gas pattern. No supine evidence of free air. No organomegaly or suspicious calcification. No acute bony abnormality. Visualized lung bases clear.  IMPRESSION: No acute findings.   Electronically Signed   By: Rolm Baptise M.D.   On: 09/05/2014 11:06    Scheduled Meds: .  stroke: mapping our early stages of recovery book   Does not apply Once  . allopurinol  300 mg Oral Daily  . antiseptic oral rinse  7 mL Mouth Rinse BID  . aspirin  81 mg Oral Daily  . atorvastatin  80 mg Oral q1800  . carvedilol  6.25 mg Oral BID WC  . feeding supplement (PRO-STAT 64)  30 mL Oral BID BM  . fentaNYL  50 mcg Transdermal Q72H  . heparin subcutaneous  5,000 Units Subcutaneous 3 times per day  . levofloxacin (LEVAQUIN) IV  750 mg Intravenous Q24H  . pantoprazole  80 mg Oral Q1200  . potassium chloride  10 mEq Oral Daily  . pregabalin  75 mg Oral BID  . sodium chloride  3 mL Intravenous Q12H   Continuous Infusions: . sodium chloride 50 mL/hr at 09/06/14 0600  . amiodarone 16.7 mg/hr (09/06/14 0745)  . norepinephrine (LEVOPHED) Adult infusion Stopped (09/04/14 1630)    Principal Problem:   Chest pain Active Problems:   Multiple myeloma   Essential hypertension   CKD (chronic kidney disease) stage 3, GFR 30-59 ml/min   GERD (gastroesophageal reflux disease)   Abdominal pain, chronic, epigastric   Cancer   FUO (fever of unknown origin)   Lethargy   Tachycardia   Neutropenic fever   Screen for STD  (sexually transmitted disease)   Aspergillus pneumonia   Dyspnea   Gram-negative bacteremia   Drug rash   Paroxysmal atrial fibrillation   Vomiting   Salmonella bacteremia    Time spent: 35 min    Velvet Bathe  [facility] Pager [phone] If 7PM-7AM, please contact night-coverage at [email or web address], password [facility] 09/06/2014, 9:58 AM  LOS: 5 days

## 2014-09-06 NOTE — Op Note (Signed)
NAMEKEM, HENSEN NO.:  [id number]  MEDICAL RECORD NO.:  [id number]  LOCATION:  1226                         FACILITY:  [facility]  PHYSICIAN:  Coralie Keens, M.D. DATE OF BIRTH:  22-May-1950  DATE OF PROCEDURE:  09/05/2014 DATE OF DISCHARGE:                              OPERATIVE REPORT   PREOPERATIVE DIAGNOSIS:  Infected Port-A-Cath.  POSTOPERATIVE DIAGNOSIS:  Infected Port-A-Cath.  PROCEDURE:  Removal of infected Port-A-Cath.  SURGEON:  Coralie Keens, M.D.  ANESTHESIA:  1% lidocaine and monitored anesthesia care.  ESTIMATED BLOOD LOSS:  Minimal.  PROCEDURE IN DETAIL:  The patient was brought to the operating room, identified as Walter Dennis who was placed supine on the operating room table and anesthesia was induced.  His right chest and neck were prepped and draped in usual sterile fashion.  I anesthetized the skin at previous scar on chest with lidocaine.  I then made an incision with scalpel, took this down to the port.  The port was actually a dual chamber port.  I was able to identify all the sutures around the port. I removed them and then removed the port from the pocket.  It was tunneled over the clavicle and up into the neck.  It did not release easily, so I anesthetized the right side of the neck to make a small incision with a scalpel, and I took this down to the catheter, which I was unable to grasp, and dissected this surrounding attachments and pulled up out of the vein and the neck.  Pressure was then held to the neck.  I was then able to remove the catheter in its entirety.  The tip was then cut off, sent to Pathology for evaluation.  I then closed the pocket with interrupted 3-0 Vicryl sutures and running 4-0 Monocryl. Hemostasis appeared to be achieved to the neck, so I then closed that incision with 3-0 Vicryl and 4-0 Monocryl as well.  Dermabond was then applied.  The patient tolerated the procedure well.  All the counts  were correct at the end of procedure.  The patient was then extubated in the operating room and taken in stable condition to the recovery room.     Coralie Keens, M.D.     DB/MEDQ  D:  09/06/2014  T:  09/06/2014  Job:  [id number]

## 2014-09-06 NOTE — Progress Notes (Signed)
Patient ID: Walter Dennis, male   DOB: [birth year], 64 y.o.   MRN: [id number]  I discussed the risk and benefits with the patient and his wife regarding port a cath removal

## 2014-09-06 NOTE — Progress Notes (Addendum)
[facility]  Date of Admission:  09/01/2014  Antibiotics: levaquin  Subjective: Port a cath out this am, feels better  Objective: Temp:  [97.9 F (36.6 C)-102.6 F (39.2 C)] 98.6 F (37 C) (06/25 1000) Pulse Rate:  [72-81] 72 (06/25 0900) Resp:  [19-32] 21 (06/25 1100) BP: (118-189)/(45-98) 151/66 mmHg (06/25 1100) SpO2:  [79 %-100 %] 99 % (06/25 1100)  General: awake, alert, nad Skin: diffuse rash, unchanged Lungs: CTA B Cor: RRR Abdomen: soft, nt, nd   Lab Results Lab Results  Component Value Date   WBC 4.8 09/06/2014   HGB 7.6* 09/06/2014   HCT 23.5* 09/06/2014   MCV 86.7 09/06/2014   PLT 105* 09/06/2014    Lab Results  Component Value Date   CREATININE 1.84* 09/06/2014   BUN 21* 09/06/2014   NA 139 09/06/2014   K 3.6 09/06/2014   CL 113* 09/06/2014   CO2 18* 09/06/2014    Lab Results  Component Value Date   ALT 32 09/06/2014   AST 19 09/06/2014   ALKPHOS 100 09/06/2014   BILITOT 1.0 09/06/2014      Microbiology: Recent Results (from the past 240 hour(s))  Culture, blood (routine x 2)     Status: None (Preliminary result)   Collection Time: 09/02/14  8:10 AM  Result Value Ref Range Status   Specimen Description BLOOD RIGHT ARM  Final   Special Requests BOTTLES DRAWN AEROBIC AND ANAEROBIC 10CC  Final   Culture   Final    NO GROWTH 3 DAYS Performed at [facility]    Report Status PENDING  Incomplete  Culture, blood (routine x 2)     Status: None (Preliminary result)   Collection Time: 09/02/14  8:15 AM  Result Value Ref Range Status   Specimen Description BLOOD RIGHT HAND  Final   Special Requests BOTTLES DRAWN AEROBIC AND ANAEROBIC 5CC  Final   Culture  Setup Time   Final    GRAM NEGATIVE RODS ANAEROBIC BOTTLE ONLY CRITICAL RESULT CALLED TO, READ BACK BY AND VERIFIED WITH: Nehemiah Settle RN @ [phone] ON [id number] BY Rhea Bleacher    Culture   Final    SALMONELLA SPECIES CRITICAL RESULT CALLED TO, READ BACK BY  AND VERIFIED WITH: R JOHNSON 09/05/14 @ [place] M VESTAL Performed at [facility]    Report Status PENDING  Incomplete   Organism ID, Bacteria SALMONELLA SPECIES  Final      Susceptibility   Salmonella species - MIC*    AMPICILLIN <=2 SENSITIVE Sensitive     LEVOFLOXACIN <=0.12 SENSITIVE Sensitive     TRIMETH/SULFA <=20 SENSITIVE Sensitive     * SALMONELLA SPECIES  MRSA PCR Screening     Status: None   Collection Time: 09/02/14  3:35 PM  Result Value Ref Range Status   MRSA by PCR NEGATIVE NEGATIVE Final    Comment:        The GeneXpert MRSA Assay (FDA approved for NASAL specimens only), is one component of a comprehensive MRSA colonization surveillance program. It is not intended to diagnose MRSA infection nor to guide or monitor treatment for MRSA infections.   CSF culture with Stat gram stain     Status: None   Collection Time: 09/02/14  5:20 PM  Result Value Ref Range Status   Specimen Description CSF  Final   Special Requests Immunocompromised  Final   Gram Stain   Final    CYTOSPIN SMEAR NO WBC SEEN NO ORGANISMS  SEEN Gram Stain Report Called to,Read Back By and Verified With: PAM Margarito Courser [id number] @ 1924 BY J SCOTTON    Culture   Final    NO GROWTH 3 DAYS Performed at [facility]    Report Status 09/06/2014 FINAL  Final  AFB culture with smear     Status: None (Preliminary result)   Collection Time: 09/02/14  5:20 PM  Result Value Ref Range Status   Specimen Description CSF  Final   Special Requests Immunocompromised  Final   Acid Fast Smear   Final    NO ACID FAST BACILLI SEEN Performed at [facility]    Culture   Final    CULTURE WILL BE EXAMINED FOR 6 WEEKS BEFORE ISSUING A FINAL REPORT Performed at [facility]    Report Status PENDING  Incomplete  Fungus Culture with Smear     Status: None (Preliminary result)   Collection Time: 09/02/14  5:20 PM  Result Value Ref Range Status   Specimen Description CSF  Final   Special  Requests Immunocompromised  Final   Fungal Smear   Final    NO YEAST OR FUNGAL ELEMENTS SEEN Performed at [facility]    Culture   Final    CULTURE IN PROGRESS FOR FOUR WEEKS Performed at [facility]    Report Status PENDING  Incomplete  Gram stain     Status: None   Collection Time: 09/02/14  5:20 PM  Result Value Ref Range Status   Specimen Description CSF  Final   Special Requests NONE  Final   Gram Stain   Final    NO ORGANISMS SEEN NO WBC SEEN CYTOSPUN SMEAR Gram Stain Report Called to,Read Back By and Verified With: PAM WEST,RN [id number] @ 1924 BY J SCOTTON    Report Status 09/02/2014 FINAL  Final  Clostridium Difficile by PCR (not at [facility])     Status: None   Collection Time: 09/05/14  2:53 AM  Result Value Ref Range Status   C difficile by pcr NEGATIVE NEGATIVE Final    Studies/Results: Dg Abd 1 View  09/05/2014   CLINICAL DATA:  Ongoing nausea, vomiting since last night. Mid abdominal discomfort.  EXAM: ABDOMEN - 1 VIEW  COMPARISON:  None.  FINDINGS: Nonobstructive bowel gas pattern. No supine evidence of free air. No organomegaly or suspicious calcification. No acute bony abnormality. Visualized lung bases clear.  IMPRESSION: No acute findings.   Electronically Signed   By: Rolm Baptise M.D.   On: 09/05/2014 11:06    Assessment/Plan:  1) Salmonela bacteremia - on levaquin due to allergies.  Can take this po.  Will repeat blood cultures in am.    2) enteric precautions - no significant diarrhea noted and has been on K and feeding supplements.  Not c/w C diff.  Test also negative.    Scharlene Gloss, [facility] [place] [email or web address] [phone] pager   [phone] cell 09/06/2014, 12:13 PM

## 2014-09-06 NOTE — Progress Notes (Signed)
Pt gone to OR this am  AFib converted to sinus overnight Would continue on IV amiodarone for now  Cardiology to see as needed this weekend  Please call with questions  Thompson Grayer MD, [facility] 09/06/2014 8:51 AM

## 2014-09-07 DIAGNOSIS — L270 Generalized skin eruption due to drugs and medicaments taken internally: Secondary | ICD-10-CM

## 2014-09-07 DIAGNOSIS — A028 Other specified salmonella infections: Secondary | ICD-10-CM

## 2014-09-07 DIAGNOSIS — R079 Chest pain, unspecified: Secondary | ICD-10-CM

## 2014-09-07 LAB — COMPREHENSIVE METABOLIC PANEL
ALT: 29 U/L (ref 17–63)
AST: 20 U/L (ref 15–41)
Albumin: 2.2 g/dL — ABNORMAL LOW (ref 3.5–5.0)
Alkaline Phosphatase: 101 U/L (ref 38–126)
Anion gap: 8 (ref 5–15)
BILIRUBIN TOTAL: 0.8 mg/dL (ref 0.3–1.2)
BUN: 23 mg/dL — ABNORMAL HIGH (ref 6–20)
CO2: 19 mmol/L — ABNORMAL LOW (ref 22–32)
Calcium: 9.3 mg/dL (ref 8.9–10.3)
Chloride: 114 mmol/L — ABNORMAL HIGH (ref 101–111)
Creatinine, Ser: 1.88 mg/dL — ABNORMAL HIGH (ref 0.61–1.24)
GFR calc non Af Amer: 37 mL/min — ABNORMAL LOW (ref >60)
GFR, EST AFRICAN AMERICAN: 43 mL/min — AB (ref >60)
GLUCOSE: 114 mg/dL — AB (ref 65–99)
Potassium: 3.9 mmol/L (ref 3.5–5.1)
Sodium: 141 mmol/L (ref 135–145)
Total Protein: 8 g/dL (ref 6.5–8.1)

## 2014-09-07 LAB — CBC WITH DIFFERENTIAL/PLATELET
BASOS ABS: 0 10*3/uL (ref 0.0–0.1)
Basophils Relative: 1 % (ref 0–1)
EOS PCT: 5 % (ref 0–5)
Eosinophils Absolute: 0.2 10*3/uL (ref 0.0–0.7)
HCT: 23.1 % — ABNORMAL LOW (ref 39.0–52.0)
Hemoglobin: 7.4 g/dL — ABNORMAL LOW (ref 13.0–17.0)
Lymphocytes Relative: 27 % (ref 12–46)
Lymphs Abs: 1.2 10*3/uL (ref 0.7–4.0)
MCH: 28.1 pg (ref 26.0–34.0)
MCHC: 32 g/dL (ref 30.0–36.0)
MCV: 87.8 fL (ref 78.0–100.0)
Monocytes Absolute: 0.5 10*3/uL (ref 0.1–1.0)
Monocytes Relative: 11 % (ref 3–12)
NEUTROS ABS: 2.5 10*3/uL (ref 1.7–7.7)
Neutrophils Relative %: 57 % (ref 43–77)
PLATELETS: 100 10*3/uL — AB (ref 150–400)
RBC: 2.63 MIL/uL — ABNORMAL LOW (ref 4.22–5.81)
RDW: 18.7 % — ABNORMAL HIGH (ref 11.5–15.5)
WBC: 4.3 10*3/uL (ref 4.0–10.5)

## 2014-09-07 LAB — CULTURE, BLOOD (ROUTINE X 2): Culture: NO GROWTH

## 2014-09-07 LAB — PROTIME-INR
INR: 1.23 (ref 0.00–1.49)
PROTHROMBIN TIME: 15.7 s — AB (ref 11.6–15.2)

## 2014-09-07 LAB — MAGNESIUM: MAGNESIUM: 2.2 mg/dL (ref 1.7–2.4)

## 2014-09-07 MED ORDER — METOPROLOL TARTRATE 1 MG/ML IV SOLN
5.0000 mg | Freq: Once | INTRAVENOUS | Status: AC
  Administered 2014-09-07: 5 mg via INTRAVENOUS
  Filled 2014-09-07: qty 5

## 2014-09-07 MED ORDER — KCL IN DEXTROSE-NACL 20-5-0.45 MEQ/L-%-% IV SOLN
INTRAVENOUS | Status: DC
  Administered 2014-09-07 – 2014-09-11 (×7): via INTRAVENOUS
  Filled 2014-09-07 (×11): qty 1000

## 2014-09-07 NOTE — Progress Notes (Signed)
Status post PAC removal 6/25.  Sore in right neck and chest areas.  Wounds are clean and intact.  No signs of bleeding.

## 2014-09-07 NOTE — Progress Notes (Signed)
[facility] PROGRESS NOTE  Alekzander Cardell ZOX:[id number] DOB: 1950-09-30 DOA: 09/01/2014 PCP: Effie Berkshire A  Brief Summary from prior PN  The patient is a 64 yo M with history of multiple myeloma, IgG-lambda dx 2008 s/p autologous stem cell transplant 2009, most recently with disease progression on carfilzomib, lenalidomide, and dexamethasone.  He started bendamustine and pomalidomide on 08/29/2014.  He has history of HTN.  Per patient, he has had fevers, chills, night sweats attributed to his MM for the last year.  He will rigor and have associated SOB which typically resolves on its own.  Separate from these symptoms, he has had intermittent severe exertional chest pain which radiates to both shoulders which is severe and associated with SOB, mild nausea, significant lightheadedness and diaphoresis.  Over the last few weeks, he has had difficulty walking up a flight of stairs before becoming SOB.  The last few days, he cannot walk across a room before he develops chest pain.  Pain is relieved by rest.  He had a CT scan which demonstrated no evidence of pneumonia or PE but did demonstrated three vessel calcifications.  Per oncology, his chemotherapy does not typically cause myocarditis or pericarditis or acute heart failure.  Cardiology has been consulted for possible unstable angina.  I have notified oncology at [facility] of patient's admission and Dr. Benay Spice has been added to the rounding list here at [facility].    Assessment/Plan   Sepsis with fever, tachycardia, tachypnea, AMS in setting of MM and rapidly dropping WBC count concerning for febrile neutropenia.  Had history of aspergillosis infection during stem cell transplant in 2009 and has been immunosuppressed for long time due to progressive myeloma and chemotherapy - ID and Neurology on board and assisting with management. - Antiinfective regimen managed by ID currently - Lactic acid level WNL currently - Blood cultures growing Salmonella  species  Gram negative bacteremia - Salmonella species per my discussion with nursing - Pt improving on Levaquin - Discussed case with ID who recommended port a cath removal. Subsequently consulted General surgery. Would like to thank ID and General Surgery for their assistance. Port a Cath removed 09/06/14   Atrial fibrillation - Cardiology on board currently and managing - rate controlled  Chest pain concerning for unstable angina.  He has no known plasmacytomas in the region.  DDx includes perimyocarditis give mildly elevated troponins.  Given calcifications on CT and longstanding HTN, unstable angina is concerning.  He had neg cath 4 years ago and negative myoview one year ago.  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  He started bendamustine and pomalidomide on 08/29/2014.  -  Agree with holding chemotherapy pending further investigation for infection -  Oncology will be available if any questions arise for now they have signed off   Pancytopenia due to MM and chemotherapy -  WBC  Within normal limits - platelets trending up - Worsening anemia in context of patient with MM and recent operation. Transfuse if active bleeding occurs or hgb < 7.0    Hyponatremia may be due to poor oral solute intake -  Stable at 133  Acute urinary retention, foley catheter placed  Diet:  NPO Access:  PIV IVF:  resume Proph: heparin  Code  Status: full Family Communication: patient  Disposition Plan: transfer to telemetry   Consultants:  Cardiology  ID  Neurology  General surgery  Procedures:  CT angio chest  Antibiotics:  levaquin  Metronidazole  HPI/Subjective: Pt returned  from port a cath removal and is resting. Was sedated this AM  Objective: Filed Vitals:   09/07/14 0700 09/07/14 0745 09/07/14 0800 09/07/14 0900  BP: 166/62  182/79 162/73  Pulse:      Temp:  98.2 F (36.8 C)    TempSrc:  Oral    Resp: 26  25 26   Height:      Weight:      SpO2: 95%  95% 96%    Intake/Output Summary (Last 24 hours) at 09/07/14 0939 Last data filed at 09/07/14 0900  Gross per 24 hour  Intake 1967.55 ml  Output   2525 ml  Net -557.45 ml   Filed Weights   09/01/14 1902 09/02/14 1300 09/03/14 0400  Weight: 99.791 kg (220 lb) 102.5 kg (225 lb 15.5 oz) 99 kg (218 lb 4.1 oz)    Exam: Initial:    General:  Awake and alert, in nad  HEENT:  No masses on visual examination, MMM  Cardiovascular:  No cyanosis, warm and pink extremities  Respiratory:  No increased wob, no wheezes, equal chest rise  Abdomen:   NABS, soft, NT/ND  MSK:   Normal tone and bulk, no LEE  Neuro:  Answers questions appropriately, no facial asymetry   Data Reviewed: Basic Metabolic Panel:  Recent Labs Lab 09/03/14 0430 09/03/14 2350 09/04/14 0558 09/05/14 0500 09/06/14 0520 09/07/14 0358  NA 133* 133* 133* 133* 139 141  K 3.2* 2.9* 3.6 3.7 3.6 3.9  CL 102 106 105 107 113* 114*  CO2 22 19* 19* 18* 18* 19*  GLUCOSE 170* 152* 121* 145* 121* 114*  BUN 21* 17 17 16  21* 23*  CREATININE 1.56* 1.47* 1.53* 1.55* 1.84* 1.88*  CALCIUM 6.7* 7.1* 7.5* 7.8* 8.4* 9.3  MG 1.4*  --  2.4 2.0 2.1 2.2  PHOS  --   --  2.0*  --   --   --    Liver Function Tests:  Recent Labs Lab 09/03/14 0430 09/04/14 0558 09/05/14 0500 09/06/14 0520 09/07/14 0358  AST 35 24 27 19 20   ALT 45 40 36 32 29  ALKPHOS 86 85 91 100 101  BILITOT 1.0 0.8 0.6 1.0 0.8  PROT 7.8 7.6 7.8 7.6 8.0  ALBUMIN 2.3* 2.3* 2.2* 2.1* 2.2*   No results for input(s): LIPASE, AMYLASE in the last 168 hours.  Recent Labs Lab 09/03/14 0510  AMMONIA 44*   CBC:  Recent Labs Lab 09/03/14 0430 09/04/14 0558  09/05/14 0500 09/06/14 0520 09/07/14 0358  WBC 1.9* 2.3* 4.2 4.8 4.3  NEUTROABS 1.5* 1.9 3.3 3.2 2.5  HGB 8.1* 8.0* 8.2* 7.6* 7.4*  HCT 25.0* 24.1* 25.0* 23.5* 23.1*  MCV 88.3 87.3 87.4 86.7 87.8  PLT 92* 95* 95* 105* 100*   Cardiac Enzymes:  Recent Labs Lab 09/01/14 2005 09/01/14 2300 09/02/14 0134  TROPONINI 0.08* 0.09* 0.09*   BNP (last 3 results)  Recent Labs  09/01/14 1359  BNP 452.2*    ProBNP (last 3 results) No results for input(s): PROBNP in the last 8760 hours.  CBG: No results for input(s): GLUCAP in the last 168 hours.  Recent Results (from the past 240 hour(s))  Culture, blood (routine x 2)     Status: None (Preliminary result)   Collection Time:  09/02/14  8:10 AM  Result Value Ref Range Status   Specimen Description BLOOD RIGHT ARM  Final   Special Requests BOTTLES DRAWN AEROBIC AND ANAEROBIC 10CC  Final   Culture   Final    NO GROWTH 4 DAYS Performed at [facility]    Report Status PENDING  Incomplete  Culture, blood (routine x 2)     Status: None (Preliminary result)   Collection Time: 09/02/14  8:15 AM  Result Value Ref Range Status   Specimen Description BLOOD RIGHT HAND  Final   Special Requests BOTTLES DRAWN AEROBIC AND ANAEROBIC 5CC  Final   Culture  Setup Time   Final    GRAM NEGATIVE RODS ANAEROBIC BOTTLE ONLY CRITICAL RESULT CALLED TO, READ BACK BY AND VERIFIED WITH: Nehemiah Settle RN @ [phone] ON [id number] BY Rhea Bleacher    Culture   Final    SALMONELLA SPECIES CRITICAL RESULT CALLED TO, READ BACK BY AND VERIFIED WITH: R JOHNSON 09/05/14 @ 0951 M VESTAL Performed at [facility]    Report Status PENDING  Incomplete   Organism ID, Bacteria SALMONELLA SPECIES  Final      Susceptibility   Salmonella species - MIC*    AMPICILLIN <=2 SENSITIVE Sensitive     LEVOFLOXACIN <=0.12 SENSITIVE Sensitive     TRIMETH/SULFA <=20 SENSITIVE Sensitive     * SALMONELLA SPECIES  MRSA PCR Screening     Status: None   Collection Time:  09/02/14  3:35 PM  Result Value Ref Range Status   MRSA by PCR NEGATIVE NEGATIVE Final    Comment:        The GeneXpert MRSA Assay (FDA approved for NASAL specimens only), is one component of a comprehensive MRSA colonization surveillance program. It is not intended to diagnose MRSA infection nor to guide or monitor treatment for MRSA infections.   CSF culture with Stat gram stain     Status: None   Collection Time: 09/02/14  5:20 PM  Result Value Ref Range Status   Specimen Description CSF  Final   Special Requests Immunocompromised  Final   Gram Stain   Final    CYTOSPIN SMEAR NO WBC SEEN NO ORGANISMS SEEN Gram Stain Report Called to,Read Back By and Verified With: PAM Margarito Courser [id number] @ 1924 BY J SCOTTON    Culture   Final    NO GROWTH 3 DAYS Performed at [name]    Report Status 09/06/2014 FINAL  Final  AFB culture with smear     Status: None (Preliminary result)   Collection Time: 09/02/14  5:20 PM  Result Value Ref Range Status   Specimen Description CSF  Final   Special Requests Immunocompromised  Final   Acid Fast Smear   Final    NO ACID FAST BACILLI SEEN Performed at [facility]    Culture   Final    CULTURE WILL BE EXAMINED FOR 6 WEEKS BEFORE ISSUING A FINAL REPORT Performed at [facility]    Report Status PENDING  Incomplete  Fungus Culture with Smear     Status: None (Preliminary result)   Collection Time: 09/02/14  5:20 PM  Result Value Ref Range Status   Specimen Description CSF  Final   Special Requests Immunocompromised  Final   Fungal Smear   Final    NO YEAST OR FUNGAL ELEMENTS SEEN Performed at [facility]    Culture   Final    CULTURE IN PROGRESS FOR FOUR WEEKS Performed  at [facility]    Report Status PENDING  Incomplete  Gram stain     Status: None   Collection Time: 09/02/14  5:20 PM  Result Value Ref Range Status   Specimen Description CSF  Final   Special Requests NONE  Final   Gram  Stain   Final    NO ORGANISMS SEEN NO WBC SEEN CYTOSPUN SMEAR Gram Stain Report Called to,Read Back By and Verified With: PAM WEST,RN [id number] @ 1924 BY J SCOTTON    Report Status 09/02/2014 FINAL  Final  Clostridium Difficile by PCR (not at [facility])     Status: None   Collection Time: 09/05/14  2:53 AM  Result Value Ref Range Status   C difficile by pcr NEGATIVE NEGATIVE Final  Cath Tip Culture     Status: None (Preliminary result)   Collection Time: 09/06/14  8:37 AM  Result Value Ref Range Status   Specimen Description CATH TIP  Final   Special Requests NONE  Final   Culture   Final    NO GROWTH 1 DAY Performed at [facility]    Report Status PENDING  Incomplete     Studies: Dg Abd 1 View  09/05/2014   CLINICAL DATA:  Ongoing nausea, vomiting since last night. Mid abdominal discomfort.  EXAM: ABDOMEN - 1 VIEW  COMPARISON:  None.  FINDINGS: Nonobstructive bowel gas pattern. No supine evidence of free air. No organomegaly or suspicious calcification. No acute bony abnormality. Visualized lung bases clear.  IMPRESSION: No acute findings.   Electronically Signed   By: Rolm Baptise M.D.   On: 09/05/2014 11:06    Scheduled Meds: .  stroke: mapping our early stages of recovery book   Does not apply Once  . allopurinol  300 mg Oral Daily  . antiseptic oral rinse  7 mL Mouth Rinse BID  . aspirin  81 mg Oral Daily  . atorvastatin  80 mg Oral q1800  . carvedilol  6.25 mg Oral BID WC  . feeding supplement (PRO-STAT 64)  30 mL Oral BID BM  . fentaNYL  50 mcg Transdermal Q72H  . heparin subcutaneous  5,000 Units Subcutaneous 3 times per day  . levofloxacin (LEVAQUIN) IV  750 mg Intravenous Q24H  . pantoprazole  80 mg Oral Q1200  . pregabalin  75 mg Oral BID  . sodium chloride  3 mL Intravenous Q12H   Continuous Infusions: . sodium chloride 50 mL/hr (09/06/14 2127)  . amiodarone 30 mg/hr (09/07/14 0558)  . dextrose 5 % and 0.45 % NaCl with KCl 20 mEq/L    . norepinephrine  (LEVOPHED) Adult infusion Stopped (09/04/14 1630)    Principal Problem:   Chest pain Active Problems:   Multiple myeloma   Essential hypertension   CKD (chronic kidney disease) stage 3, GFR 30-59 ml/min   GERD (gastroesophageal reflux disease)   Abdominal pain, chronic, epigastric   Cancer   FUO (fever of unknown origin)   Lethargy   Tachycardia   Neutropenic fever   Screen for STD (sexually transmitted disease)   Aspergillus pneumonia   Dyspnea   Gram-negative bacteremia   Drug rash   Paroxysmal atrial fibrillation   Vomiting   Salmonella bacteremia    Time spent: 35 min    Velvet Bathe  [facility] Pager [phone] If 7PM-7AM, please contact night-coverage at [email or web address], password [facility] 09/07/2014, 9:39 AM  LOS: 6 days

## 2014-09-07 NOTE — Progress Notes (Signed)
ANTIBIOTIC CONSULT NOTE - FOLLOW UP  Pharmacy Consult for Levaquin Indication: Salmonella bacteremia  Allergies  Allergen Reactions  . Ceftazidime Rash    Petechial rash   . Penicillins Hives  . Zosyn [Piperacillin Sod-Tazobactam So] Hives    Patient Measurements: Height: 6' (182.9 cm) Weight: 218 lb 4.1 oz (99 kg) IBW/kg (Calculated) : 77.6  Vital Signs: Temp: 98.2 F (36.8 C) (06/26 0745) Temp Source: Oral (06/26 0745) BP: 162/73 mmHg (06/26 0900) Intake/Output from previous day: 06/25 0701 - 06/26 0700 In: 2834.2 [I.V.:2684.2; IV Piggyback:150] Out: 2525 [Urine:2525]  Labs:  Recent Labs  09/05/14 0500 09/06/14 0520 09/07/14 0358  WBC 4.2 4.8 4.3  HGB 8.2* 7.6* 7.4*  PLT 95* 105* 100*  CREATININE 1.55* 1.84* 1.88*   Estimated Creatinine Clearance: 49.7 mL/min (by C-G formula based on Cr of 1.88).    Assessment: 76 yoM admitted 6/20 with chills, SOB, and chest pain.  PMH includes multiple myeloma currently receiving chemotherapy, aspergillosis (2013), HTN, and recent pneumonia.  Admission labs show neutropenia; CT head was w/o acute abnormality.  Pharmacy was initially consulted to dose vancomycin, ceftazidime, and acylcovir for sepsis, febrile neutropenia, encephalitis.  Due to a rash, [place] Spotted Fever was a concern and changed to Levaquin.  He was then found to have Salmonella bacteremia and Levaquin was continued.  6/21 >> Vancomycin >> 6/22 6/21 >> Zosyn >> 6/21, restart 6/22 >> 6/22 6/21 >> Ceftazidime >> 6/21, restart 6/22 >> 6/23 6/21 >> Acyclovir >> 6/22 6/22 >> flagyl >> 6/24 6/23 >> levofloxacin >> 6/24 >> Doxy >> 6/24  Micro:  6/21 Blood: 1/2 1/2 Salmonella species (sens: ampicillin, LVQ, T/S)  6/25 PAC cath tip culture: ngtd 6/26 Blood cultures (repeat): sent  Today, 09/07/2014:  Tm 100.6   WBC: remains WNL  Renal: SCr increased to 1.88, CrCl ~ 50 ml/min.   Goal of Therapy:  Appropriate abx dosing, eradication of infection.    Plan:   Continue Levaquin 750 mg IV q24 hr.   Closely monitor renal function and adjust Levaquin dose for CrCl < 50 ml/min.  Follow clinical course, renal function, culture results as available  Gretta Arab PharmD, BCPS Pager [phone] 09/07/2014 9:47 AM

## 2014-09-07 NOTE — Progress Notes (Addendum)
[place] for Infectious Disease  Date of Admission:  09/01/2014  Antibiotics: levaquin  Subjective: Tolerating some po; asking for a detailed summary of infection at discharge for his employer  Objective: Temp:  [98 F (36.7 C)-100.6 F (38.1 C)] 99.5 F (37.5 C) (06/26 1115) Resp:  [14-36] 20 (06/26 1000) BP: (148-199)/(49-92) 162/79 mmHg (06/26 1000) SpO2:  [92 %-100 %] 99 % (06/26 1000)  General: awake, alert, nad Skin: diffuse rash, improved on face and somewhat on legs Lungs: CTA B Cor: RRR Abdomen: soft, nt, nd   Lab Results Lab Results  Component Value Date   WBC 4.3 09/07/2014   HGB 7.4* 09/07/2014   HCT 23.1* 09/07/2014   MCV 87.8 09/07/2014   PLT 100* 09/07/2014    Lab Results  Component Value Date   CREATININE 1.88* 09/07/2014   BUN 23* 09/07/2014   NA 141 09/07/2014   K 3.9 09/07/2014   CL 114* 09/07/2014   CO2 19* 09/07/2014    Lab Results  Component Value Date   ALT 29 09/07/2014   AST 20 09/07/2014   ALKPHOS 101 09/07/2014   BILITOT 0.8 09/07/2014      Microbiology: Recent Results (from the past 240 hour(s))  Culture, blood (routine x 2)     Status: None   Collection Time: 09/02/14  8:10 AM  Result Value Ref Range Status   Specimen Description BLOOD RIGHT ARM  Final   Special Requests BOTTLES DRAWN AEROBIC AND ANAEROBIC 10CC  Final   Culture   Final    NO GROWTH 5 DAYS Performed at [facility]    Report Status 09/07/2014 FINAL  Final  Culture, blood (routine x 2)     Status: None (Preliminary result)   Collection Time: 09/02/14  8:15 AM  Result Value Ref Range Status   Specimen Description BLOOD RIGHT HAND  Final   Special Requests BOTTLES DRAWN AEROBIC AND ANAEROBIC 5CC  Final   Culture  Setup Time   Final    GRAM NEGATIVE RODS ANAEROBIC BOTTLE ONLY CRITICAL RESULT CALLED TO, READ BACK BY AND VERIFIED WITH: Nehemiah Settle RN @ [phone] ON [id number] BY Rhea Bleacher    Culture   Final    SALMONELLA SPECIES CRITICAL  RESULT CALLED TO, READ BACK BY AND VERIFIED WITH: R JOHNSON 09/05/14 @ 0951 M VESTAL Performed at [facility]    Report Status PENDING  Incomplete   Organism ID, Bacteria SALMONELLA SPECIES  Final      Susceptibility   Salmonella species - MIC*    AMPICILLIN <=2 SENSITIVE Sensitive     LEVOFLOXACIN <=0.12 SENSITIVE Sensitive     TRIMETH/SULFA <=20 SENSITIVE Sensitive     * SALMONELLA SPECIES  MRSA PCR Screening     Status: None   Collection Time: 09/02/14  3:35 PM  Result Value Ref Range Status   MRSA by PCR NEGATIVE NEGATIVE Final    Comment:        The GeneXpert MRSA Assay (FDA approved for NASAL specimens only), is one component of a comprehensive MRSA colonization surveillance program. It is not intended to diagnose MRSA infection nor to guide or monitor treatment for MRSA infections.   CSF culture with Stat gram stain     Status: None   Collection Time: 09/02/14  5:20 PM  Result Value Ref Range Status   Specimen Description CSF  Final   Special Requests Immunocompromised  Final   Gram Stain   Final    CYTOSPIN SMEAR  NO WBC SEEN NO ORGANISMS SEEN Gram Stain Report Called to,Read Back By and Verified With: PAM Margarito Courser [id number] @ 1924 BY J SCOTTON    Culture   Final    NO GROWTH 3 DAYS Performed at [facility]    Report Status 09/06/2014 FINAL  Final  AFB culture with smear     Status: None (Preliminary result)   Collection Time: 09/02/14  5:20 PM  Result Value Ref Range Status   Specimen Description CSF  Final   Special Requests Immunocompromised  Final   Acid Fast Smear   Final    NO ACID FAST BACILLI SEEN Performed at [facility]    Culture   Final    CULTURE WILL BE EXAMINED FOR 6 WEEKS BEFORE ISSUING A FINAL REPORT Performed at [facility]    Report Status PENDING  Incomplete  Fungus Culture with Smear     Status: None (Preliminary result)   Collection Time: 09/02/14  5:20 PM  Result Value Ref Range Status   Specimen  Description CSF  Final   Special Requests Immunocompromised  Final   Fungal Smear   Final    NO YEAST OR FUNGAL ELEMENTS SEEN Performed at [facility]    Culture   Final    CULTURE IN PROGRESS FOR FOUR WEEKS Performed at [facility]    Report Status PENDING  Incomplete  Gram stain     Status: None   Collection Time: 09/02/14  5:20 PM  Result Value Ref Range Status   Specimen Description CSF  Final   Special Requests NONE  Final   Gram Stain   Final    NO ORGANISMS SEEN NO WBC SEEN CYTOSPUN SMEAR Gram Stain Report Called to,Read Back By and Verified With: PAM WEST,RN [id number] @ 1924 BY J SCOTTON    Report Status 09/02/2014 FINAL  Final  Clostridium Difficile by PCR (not at [facility])     Status: None   Collection Time: 09/05/14  2:53 AM  Result Value Ref Range Status   C difficile by pcr NEGATIVE NEGATIVE Final  Cath Tip Culture     Status: None (Preliminary result)   Collection Time: 09/06/14  8:37 AM  Result Value Ref Range Status   Specimen Description CATH TIP  Final   Special Requests NONE  Final   Culture   Final    NO GROWTH 1 DAY Performed at [facility]    Report Status PENDING  Incomplete    Studies/Results: No results found.  Assessment/Plan:  1) Salmonela bacteremia - on levaquin due to allergies.  Can take this po.  Blood cultures repeated to assure clearance.   Would wait to confirm negative cultures before consideration of new port a cath, if still needed   2) enteric precautions - no significant diarrhea noted and has been on K and feeding supplements.  Not c/w C diff.  Test also negative.   -will remove  Dr. Johnnye Sima to follow up tomorrow  Scharlene Gloss, [place] [facility] [place] [email or web address] [phone] pager   [phone] cell 09/07/2014, 1:45 PM

## 2014-09-08 ENCOUNTER — Encounter (HOSPITAL_COMMUNITY): Payer: Self-pay | Admitting: Surgery

## 2014-09-08 DIAGNOSIS — Z9889 Other specified postprocedural states: Secondary | ICD-10-CM

## 2014-09-08 DIAGNOSIS — B3781 Candidal esophagitis: Secondary | ICD-10-CM

## 2014-09-08 DIAGNOSIS — D649 Anemia, unspecified: Secondary | ICD-10-CM

## 2014-09-08 DIAGNOSIS — B370 Candidal stomatitis: Secondary | ICD-10-CM | POA: Diagnosis present

## 2014-09-08 DIAGNOSIS — E43 Unspecified severe protein-calorie malnutrition: Secondary | ICD-10-CM | POA: Diagnosis present

## 2014-09-08 DIAGNOSIS — R21 Rash and other nonspecific skin eruption: Secondary | ICD-10-CM

## 2014-09-08 DIAGNOSIS — R5081 Fever presenting with conditions classified elsewhere: Secondary | ICD-10-CM

## 2014-09-08 LAB — MAGNESIUM: Magnesium: 2.1 mg/dL (ref 1.7–2.4)

## 2014-09-08 LAB — CBC WITH DIFFERENTIAL/PLATELET
BASOS PCT: 1 % (ref 0–1)
Basophils Absolute: 0 10*3/uL (ref 0.0–0.1)
EOS PCT: 7 % — AB (ref 0–5)
Eosinophils Absolute: 0.3 10*3/uL (ref 0.0–0.7)
HCT: 22.1 % — ABNORMAL LOW (ref 39.0–52.0)
Hemoglobin: 7.2 g/dL — ABNORMAL LOW (ref 13.0–17.0)
Lymphocytes Relative: 25 % (ref 12–46)
Lymphs Abs: 0.9 10*3/uL (ref 0.7–4.0)
MCH: 28.9 pg (ref 26.0–34.0)
MCHC: 32.6 g/dL (ref 30.0–36.0)
MCV: 88.8 fL (ref 78.0–100.0)
MONO ABS: 0.5 10*3/uL (ref 0.1–1.0)
Monocytes Relative: 13 % — ABNORMAL HIGH (ref 3–12)
Neutro Abs: 2 10*3/uL (ref 1.7–7.7)
Neutrophils Relative %: 54 % (ref 43–77)
Platelets: 93 10*3/uL — ABNORMAL LOW (ref 150–400)
RBC: 2.49 MIL/uL — ABNORMAL LOW (ref 4.22–5.81)
RDW: 18.9 % — ABNORMAL HIGH (ref 11.5–15.5)
WBC: 3.7 10*3/uL — ABNORMAL LOW (ref 4.0–10.5)

## 2014-09-08 LAB — COMPREHENSIVE METABOLIC PANEL
ALK PHOS: 107 U/L (ref 38–126)
ALT: 27 U/L (ref 17–63)
ANION GAP: 9 (ref 5–15)
AST: 18 U/L (ref 15–41)
Albumin: 2.2 g/dL — ABNORMAL LOW (ref 3.5–5.0)
BILIRUBIN TOTAL: 0.7 mg/dL (ref 0.3–1.2)
BUN: 21 mg/dL — AB (ref 6–20)
CHLORIDE: 111 mmol/L (ref 101–111)
CO2: 20 mmol/L — AB (ref 22–32)
Calcium: 10.1 mg/dL (ref 8.9–10.3)
Creatinine, Ser: 1.9 mg/dL — ABNORMAL HIGH (ref 0.61–1.24)
GFR calc non Af Amer: 36 mL/min — ABNORMAL LOW (ref >60)
GFR, EST AFRICAN AMERICAN: 42 mL/min — AB (ref >60)
Glucose, Bld: 144 mg/dL — ABNORMAL HIGH (ref 65–99)
POTASSIUM: 3.3 mmol/L — AB (ref 3.5–5.1)
SODIUM: 140 mmol/L (ref 135–145)
Total Protein: 7.5 g/dL (ref 6.5–8.1)

## 2014-09-08 LAB — CATH TIP CULTURE: CULTURE: NO GROWTH

## 2014-09-08 LAB — EHRLICHIA ANTIBODY PANEL
E CHAFFEENSIS AB, IGG: NEGATIVE
E chaffeensis (HGE) Ab, IgM: NEGATIVE
E. CHAFFEENSIS (HME) IGM TITER: NEGATIVE
E. CHAFFEENSIS IGG AB: NEGATIVE

## 2014-09-08 LAB — PROTIME-INR
INR: 1.26 (ref 0.00–1.49)
PROTHROMBIN TIME: 16 s — AB (ref 11.6–15.2)

## 2014-09-08 MED ORDER — POTASSIUM CHLORIDE CRYS ER 20 MEQ PO TBCR
40.0000 meq | EXTENDED_RELEASE_TABLET | Freq: Once | ORAL | Status: AC
  Administered 2014-09-08: 40 meq via ORAL
  Filled 2014-09-08: qty 2

## 2014-09-08 MED ORDER — NYSTATIN 100000 UNIT/ML MT SUSP
5.0000 mL | Freq: Four times a day (QID) | OROMUCOSAL | Status: DC
  Administered 2014-09-08 – 2014-09-13 (×13): 500000 [IU] via ORAL
  Filled 2014-09-08 (×14): qty 5

## 2014-09-08 MED ORDER — AMIODARONE HCL 200 MG PO TABS
400.0000 mg | ORAL_TABLET | Freq: Two times a day (BID) | ORAL | Status: DC
  Administered 2014-09-08 – 2014-09-13 (×10): 400 mg via ORAL
  Filled 2014-09-08 (×11): qty 2

## 2014-09-08 MED ORDER — FLUCONAZOLE 40 MG/ML PO SUSR: 100.0000 mg | Freq: Every day | ORAL | Status: DC

## 2014-09-08 MED ORDER — POTASSIUM CHLORIDE 20 MEQ/15ML (10%) PO SOLN: 40.0000 meq | Freq: Once | ORAL | Status: DC

## 2014-09-08 MED ORDER — CARVEDILOL 12.5 MG PO TABS
12.5000 mg | ORAL_TABLET | Freq: Two times a day (BID) | ORAL | Status: DC
  Administered 2014-09-08 – 2014-09-13 (×11): 12.5 mg via ORAL
  Filled 2014-09-08 (×11): qty 1

## 2014-09-08 NOTE — Progress Notes (Signed)
Patient Name: Walter Dennis Date of Encounter: 09/08/2014  Primary Cardiologist: Nahser   Principal Problem:   Chest pain Active Problems:   Multiple myeloma   Essential hypertension   CKD (chronic kidney disease) stage 3, GFR 30-59 ml/min   GERD (gastroesophageal reflux disease)   Abdominal pain, chronic, epigastric   Cancer   FUO (fever of unknown origin)   Lethargy   Tachycardia   Neutropenic fever   Screen for STD (sexually transmitted disease)   Aspergillus pneumonia   Dyspnea   Gram-negative bacteremia   Drug rash   Paroxysmal atrial fibrillation   Vomiting   Salmonella bacteremia    SUBJECTIVE  Denies any recurrent CP or SOB for the past few days. Port-a-cath was taken out by surgery due to suspected infection of the line, doing well now. Said to have some L neck sore muscle after the R sided line was taken out. Sore neck worse with palpation. Per surgery, will followup with surgery as outpatient when decided to have new port-a-cath placed. Was aware that his heart went into a-fib yesterday for 30 min, had some palpitation. No further symptom since came out of a-fib  CURRENT MEDS .  stroke: mapping our early stages of recovery book   Does not apply Once  . allopurinol  300 mg Oral Daily  . antiseptic oral rinse  7 mL Mouth Rinse BID  . aspirin  81 mg Oral Daily  . atorvastatin  80 mg Oral q1800  . carvedilol  6.25 mg Oral BID WC  . feeding supplement (PRO-STAT 64)  30 mL Oral BID BM  . fentaNYL  50 mcg Transdermal Q72H  . heparin subcutaneous  5,000 Units Subcutaneous 3 times per day  . levofloxacin (LEVAQUIN) IV  750 mg Intravenous Q24H  . pantoprazole  80 mg Oral Q1200  . pregabalin  75 mg Oral BID  . sodium chloride  3 mL Intravenous Q12H    OBJECTIVE  Filed Vitals:   09/07/14 1900 09/07/14 2000 09/08/14 0000 09/08/14 0400  BP: 148/64 123/50 157/68 169/70  Pulse:      Temp:  98.7 F (37.1 C) 98.9 F (37.2 C) 99.2 F (37.3 C)  TempSrc:   Oral Oral    Resp: 16 21 19 17   Height:      Weight:      SpO2: 98% 98% 97% 98%    Intake/Output Summary (Last 24 hours) at 09/08/14 0727 Last data filed at 09/08/14 0700  Gross per 24 hour  Intake 2757.47 ml  Output   1700 ml  Net 1057.47 ml   Filed Weights   09/01/14 1902 09/02/14 1300 09/03/14 0400  Weight: 220 lb (99.791 kg) 225 lb 15.5 oz (102.5 kg) 218 lb 4.1 oz (99 kg)    PHYSICAL EXAM  General: Pleasant, NAD. Neuro: Alert and oriented X 3. Moves all extremities spontaneously. Psych: Normal affect. HEENT:  Normal  Neck: Supple without bruits or JVD. R neck base scare fixed with glue Lungs:  Resp regular and unlabored, CTA. Heart: RRR no s3, s4, or murmurs. Abdomen: Soft, non-tender, non-distended, BS + x 4.  Extremities: No clubbing, cyanosis or edema. DP/PT/Radials 2+ and equal bilaterally.  Accessory Clinical Findings  CBC  Recent Labs  09/07/14 0358 09/08/14 0351  WBC 4.3 3.7*  NEUTROABS 2.5 2.0  HGB 7.4* 7.2*  HCT 23.1* 22.1*  MCV 87.8 88.8  PLT 100* 93*   Basic Metabolic Panel  Recent Labs  09/07/14 0358 09/08/14 0351  NA 141 140  K 3.9 3.3*  CL 114* 111  CO2 19* 20*  GLUCOSE 114* 144*  BUN 23* 21*  CREATININE 1.88* 1.90*  CALCIUM 9.3 10.1  MG 2.2 2.1   Liver Function Tests  Recent Labs  09/07/14 0358 09/08/14 0351  AST 20 18  ALT 29 27  ALKPHOS 101 107  BILITOT 0.8 0.7  PROT 8.0 7.5  ALBUMIN 2.2* 2.2*    TELE NSR with HR 70s, one episode of a-fib with RVR between 11:22 until 11:53am on 6/26    ECG  No new EKG  Echocardiogram 09/03/2014  LV EF: 45% -  50%  ------------------------------------------------------------------- Indications:   Chest pain 786.51.  ------------------------------------------------------------------- History:  PMH: Cancer. Risk factors: Hypertension.  ------------------------------------------------------------------- Study Conclusions  - Left ventricle: Abnormal septal  motion Inferobasal hypokinesis The cavity size was normal. Wall thickness was normal. Systolic function was mildly reduced. The estimated ejection fraction was in the range of 45% to 50%. - Left atrium: The atrium was mildly dilated. - Atrial septum: No defect or patent foramen ovale was identified.    Radiology/Studies  Dg Chest 2 View  09/01/2014   CLINICAL DATA:  Shortness of breath and chest pain for 2 days  EXAM: CHEST - 2 VIEW  COMPARISON:  08/08/2014  FINDINGS: Cardiac shadow is at the upper limits of normal in size but stable. The lungs are well aerated bilaterally. No focal infiltrate or sizable effusion is seen. A dual-lumen chest wall port is noted on the right stable from the prior exam. No acute bony abnormality is seen per  IMPRESSION: No active disease.   Electronically Signed   By: Inez Catalina M.D.   On: 09/01/2014 15:38   Dg Abd 1 View  09/05/2014   CLINICAL DATA:  Ongoing nausea, vomiting since last night. Mid abdominal discomfort.  EXAM: ABDOMEN - 1 VIEW  COMPARISON:  None.  FINDINGS: Nonobstructive bowel gas pattern. No supine evidence of free air. No organomegaly or suspicious calcification. No acute bony abnormality. Visualized lung bases clear.  IMPRESSION: No acute findings.   Electronically Signed   By: Rolm Baptise M.D.   On: 09/05/2014 11:06   Ct Head Wo Contrast  09/02/2014   CLINICAL DATA:  Lethargy. Right arm drift. Somnolence and confusion  EXAM: CT HEAD WITHOUT CONTRAST  TECHNIQUE: Contiguous axial images were obtained from the base of the skull through the vertex without intravenous contrast.  COMPARISON:  None.  FINDINGS: Mild prominence of the sulci and ventricles noted consistent with brain atrophy. Subtle low attenuation within the subcortical and periventricular white matter noted. There is no abnormal extra-axial fluid collection, intracranial hemorrhage or infarct. There is mild mucosal thickening involving the left maxillary sinus. Retention cysts  versus polyps noted within both maxillary sinuses. The mastoid air cells are clear. The calvarium is intact.  IMPRESSION: 1. No acute intracranial abnormalities.   Electronically Signed   By: Kerby Moors M.D.   On: 09/02/2014 15:03   Ct Chest Wo Contrast  09/01/2014   CLINICAL DATA:  Dull generalized chest pain, shortness of breath, diaphoresis starting around 0730 hours. History of myeloma. Ongoing chemotherapy treatment.  EXAM: CT CHEST WITHOUT CONTRAST  TECHNIQUE: Multidetector CT imaging of the chest was performed following the standard protocol without IV contrast.  COMPARISON:  08/05/2014  FINDINGS: CT CHEST FINDINGS  Mediastinum/Nodes: Heart is normal size. Coronary artery calcifications in the 3 major coronary vessels. Aortic calcifications. No aneurysm. Small scattered mediastinal lymph nodes, none pathologically enlarged. No axillary or visible hilar  adenopathy.  Lungs/Pleura: Lungs are clear. No focal airspace opacities or suspicious nodules. No effusions.  Chest wall: Right chest wall Port-A-Cath noted in place. The tip is in the SVC. Mild bilateral gynecomastia. Chest wall soft tissues otherwise unremarkable.  Upper abdomen: Mild diffuse fatty infiltration of the liver. No acute findings in the visualized upper abdomen.  Musculoskeletal: No acute bony abnormality.  IMPRESSION: Coronary artery disease.  Fatty infiltration of the liver.  No acute findings.   Electronically Signed   By: Rolm Baptise M.D.   On: 09/01/2014 17:29   Ct Angio Chest Pe W/cm &/or Wo Cm  09/02/2014   CLINICAL DATA:  64 year old male with sudden onset confusion, altered mental status, diaphoresis, lethargy. Multiple myeloma, chemotherapy underway. Initial encounter.  EXAM: CT ANGIOGRAPHY CHEST WITH CONTRAST  TECHNIQUE: Multidetector CT imaging of the chest was performed using the standard protocol during bolus administration of intravenous contrast. Multiplanar CT image reconstructions and MIPs were obtained to evaluate  the vascular anatomy.  CONTRAST:  164m OMNIPAQUE IOHEXOL 350 MG/ML SOLN  COMPARISON:  Noncontrast chest CT from yesterday, chest CTA 08/05/2014, and earlier  FINDINGS: Suboptimal contrast bolus timing in the pulmonary arterial tree. Increased respiratory motion artifact compared to the 08/05/2014 exam. The central pulmonary arteries remain patent. No hilar pulmonary artery filling defect identified. No lobar segmental filling defect identified. The more distal branches are not well evaluated today.  Trace bilateral pleural effusions are new since yesterday. Dependent and right middle lobe atelectasis. Major airways remain patent. No consolidation.  Cardiomegaly. No pericardial effusion. Stable mediastinal nodes. Aortic and Coronary artery calcified atherosclerosis. No thoracic aortic dissection or aneurysm. Right chest porta cath. No axillary lymphadenopathy.  Hepatic steatosis, visible spleen, and bowel in the upper abdomen are stable.  Abnormal bone mineralization throughout the visible skeleton is stable since May. No acute fracture or dislocation identified in the thorax.  Review of the MIP images confirms the above findings.  IMPRESSION: 1. Suboptimal exam with regard to distal pulmonary arteries, no acute pulmonary embolus identified. 2. Trace pleural effusions are new since yesterday. Pulmonary atelectasis. 3. No other acute findings in the chest. Diffuse myelomatous skeletal involvement.   Electronically Signed   By: HGenevie AnnM.D.   On: 09/02/2014 15:09   Mr BJeri CosWSLContrast  09/02/2014   CLINICAL DATA:  Patient has myeloma. Shortness of breath, dizziness, and diaphoresis beginning earlier today. Fever of unknown origin.  EXAM: MRI HEAD WITHOUT AND WITH CONTRAST  TECHNIQUE: Multiplanar, multiecho pulse sequences of the brain and surrounding structures were obtained without and with intravenous contrast.  CONTRAST:  236mMULTIHANCE GADOBENATE DIMEGLUMINE 529 MG/ML IV SOLN  COMPARISON:  CT head earlier  today.  FINDINGS: Equipment malfunction necessitated using an older model head coil. Furthermore patient motion is present. Image quality is reduced.  No evidence for acute infarction, hemorrhage, mass lesion, hydrocephalus, or extra-axial fluid. Mild cerebral and cerebellar atrophy. Mild subcortical and periventricular T2 and FLAIR hyperintensities, likely chronic microvascular ischemic change. Flow voids are maintained throughout the carotid, basilar, and vertebral arteries. There are no areas of chronic hemorrhage.  Post infusion, no abnormal enhancement of the brain or meninges. Chronic LEFT maxillary sinus disease.  Heterogeneity of the skull base, and visualized portions of C2 and C3 vertebrae, also patchy heterogeneity of the diploic space, without expansile lesion, could represent myeloma involvement. No concern for pathologic fracture of the upper cervical vertebrae in those segments which are visualized.  IMPRESSION: Mild atrophy and small vessel disease. No  acute intracranial findings.  No abnormal postcontrast enhancement of the brain or meninges. Specifically no evidence for cerebritis or meningitis.  Heterogeneity of the skull base and upper cervical region, less so the diploic space, but concern for bone marrow involvement with myeloma.   Electronically Signed   By: Staci Righter M.D.   On: 09/02/2014 20:11   Dg Chest Port 1 View  09/02/2014   CLINICAL DATA:  Shortness of Breath  EXAM: PORTABLE CHEST - 1 VIEW  COMPARISON:  Chest radiograph and chest CT September 01, 2014  FINDINGS: There is generalized interstitial edema. There is no airspace consolidation. Heart is borderline enlarged with pulmonary venous hypertension. Port-A-Cath tip is in superior vena cava. No pneumothorax. No adenopathy.  IMPRESSION: Evidence of congestive heart failure.   Electronically Signed   By: Lowella Grip III M.D.   On: 09/02/2014 12:25    ASSESSMENT AND PLAN  64 y/o male with multiple myeloma followed at [facility],  undergoing chemo, admitted with chest pain and dyspnea.   1. Chest pain/dyspnea - concerning for unstable angina  - new LBBB, borderline flat trop, coronary calcification noted on CT (previously neg cath 2012 in [place] and neg NST 10/2012)  - LHC is indicated. However, the patient is undergoing extensive w/u by ID and neurology given worsening pancytopenia, fever and recent confusion. Will need to delay cath until his other medical issues improve, monitor renal function and hgb  - echo with Ef 45-50% mildly reduced, last nuc 07/2013 his EF 54%  - continue ASA, coreg, lipitor. No ACEI or ARB with AKI  2. PAF with RVR - now back in NSR  - CHA2DS2VASC score 2  - occurred in the setting of infection, will hold off on starting systemic anticoagulation as he is a poor candidate and want to see if a-fib would recur after infection resolved.  - has been going in and out of a-fib, will uptitrate coreg, loaded with IV amio for past 5 days, will transition to PO amiodarone 445m BID for 7 days, then 2050mBID for a month, then 20057maily afterward (hopefully can discontinue at some point)   3. AMS: lumbar puncture 6/21 (neg csf cx)  - in the setting of multiple medical issues, including multiple myeloma, chemotherapy, worsening pancytopenia and fever  4. Multiple myeloma with pancytopenia  5. Rash without pruiritis suspect due to beta lactams and zosyn  6. Septic shock related to Salmonella bacteremia: por-a-cath removed by Surgery in OR 09/06/2014  - shock resolved.  7. Acute renal insufficiency: nephrotoxic agents removed including vanc  Signed, MenWoodward Kuger: [id number] and all data above reviewed.  Patient examined.  I agree with the findings as above.  The patient exam reveals COR:RRR  ,  Lungs: Clear  ,  Abd: Positive bowel sounds, no rebound no guarding, Ext No edema, rash improved.  .  All available labs, radiology testing, previous records reviewed. Agree with documented  assessment and plan. Atrial fib:  On PO amio now.  We will follow.    JamMinus Breeding:59 AM  09/08/2014

## 2014-09-08 NOTE — Progress Notes (Signed)
INFECTIOUS DISEASE PROGRESS NOTE  ID: Walter Dennis is a 64 y.o. male with  Principal Problem:   Chest pain Active Problems:   Multiple myeloma   Essential hypertension   CKD (chronic kidney disease) stage 3, GFR 30-59 ml/min   GERD (gastroesophageal reflux disease)   Abdominal pain, chronic, epigastric   Cancer   FUO (fever of unknown origin)   Lethargy   Tachycardia   Neutropenic fever   Screen for STD (sexually transmitted disease)   Aspergillus pneumonia   Dyspnea   Gram-negative bacteremia   Drug rash   Paroxysmal atrial fibrillation   Vomiting   Salmonella bacteremia  Subjective: Normal BM C/o nausea No dysphagia Feels like rash is better  Abtx:  Anti-infectives    Start     Dose/Rate Route Frequency Ordered Stop   09/05/14 1000  doxycycline (VIBRAMYCIN) 100 mg in dextrose 5 % 250 mL IVPB  Status:  Discontinued     100 mg 125 mL/hr over 120 Minutes Intravenous Every 12 hours 09/05/14 0848 09/05/14 1004   09/04/14 1200  levofloxacin (LEVAQUIN) IVPB 750 mg     750 mg 100 mL/hr over 90 Minutes Intravenous Every 24 hours 09/04/14 1128     09/03/14 2330  cefTAZidime (FORTAZ) 2 g in dextrose 5 % 50 mL IVPB  Status:  Discontinued     2 g 100 mL/hr over 30 Minutes Intravenous 3 times per day 09/03/14 2324 09/04/14 1121   09/03/14 2330  metroNIDAZOLE (FLAGYL) IVPB 500 mg  Status:  Discontinued     500 mg 100 mL/hr over 60 Minutes Intravenous Every 8 hours 09/03/14 2324 09/05/14 1428   09/03/14 2200  piperacillin-tazobactam (ZOSYN) IVPB 3.375 g  Status:  Discontinued     3.375 g 12.5 mL/hr over 240 Minutes Intravenous 3 times per day 09/03/14 1801 09/03/14 2303   09/03/14 1500  metroNIDAZOLE (FLAGYL) tablet 500 mg  Status:  Discontinued     500 mg Oral 3 times per day 09/03/14 1423 09/03/14 1754   09/02/14 2000  vancomycin (VANCOCIN) 1,250 mg in sodium chloride 0.9 % 250 mL IVPB  Status:  Discontinued     1,250 mg 166.7 mL/hr over 90 Minutes Intravenous Every 12  hours 09/02/14 0850 09/03/14 1754   09/02/14 1930  acyclovir (ZOVIRAX) 775 mg in dextrose 5 % 150 mL IVPB  Status:  Discontinued     775 mg 165.5 mL/hr over 60 Minutes Intravenous 3 times per day 09/02/14 1901 09/03/14 1754   09/02/14 1400  piperacillin-tazobactam (ZOSYN) IVPB 3.375 g  Status:  Discontinued     3.375 g 12.5 mL/hr over 240 Minutes Intravenous 3 times per day 09/02/14 0749 09/02/14 1209   09/02/14 1400  cefTAZidime (FORTAZ) 2 g in dextrose 5 % 50 mL IVPB  Status:  Discontinued     2 g 100 mL/hr over 30 Minutes Intravenous 3 times per day 09/02/14 1210 09/03/14 1754   09/02/14 0800  vancomycin (VANCOCIN) 1,250 mg in sodium chloride 0.9 % 250 mL IVPB     1,250 mg 166.7 mL/hr over 90 Minutes Intravenous STAT 09/02/14 0752 09/02/14 0930   09/02/14 0745  piperacillin-tazobactam (ZOSYN) IVPB 3.375 g     3.375 g 100 mL/hr over 30 Minutes Intravenous  Once 09/02/14 0737 09/02/14 0815   09/02/14 0745  vancomycin (VANCOCIN) IVPB 1000 mg/200 mL premix  Status:  Discontinued     1,000 mg 200 mL/hr over 60 Minutes Intravenous  Once 09/02/14 0737 09/02/14 0746  Medications:  Scheduled: .  stroke: mapping our early stages of recovery book   Does not apply Once  . allopurinol  300 mg Oral Daily  . amiodarone  400 mg Oral BID  . antiseptic oral rinse  7 mL Mouth Rinse BID  . aspirin  81 mg Oral Daily  . atorvastatin  80 mg Oral q1800  . carvedilol  12.5 mg Oral BID WC  . feeding supplement (PRO-STAT 64)  30 mL Oral BID BM  . fentaNYL  50 mcg Transdermal Q72H  . heparin subcutaneous  5,000 Units Subcutaneous 3 times per day  . levofloxacin (LEVAQUIN) IV  750 mg Intravenous Q24H  . pantoprazole  80 mg Oral Q1200  . pregabalin  75 mg Oral BID  . sodium chloride  3 mL Intravenous Q12H    Objective: Vital signs in last 24 hours: Temp:  [98.7 F (37.1 C)-99.4 F (37.4 C)] 99 F (37.2 C) (06/27 1200) Resp:  [16-22] 17 (06/27 0800) BP: (123-169)/(50-84) 164/82 mmHg (06/27  0800) SpO2:  [97 %-100 %] 100 % (06/27 0800)   General appearance: alert, cooperative and no distress Throat: abnormal findings: thrush Resp: clear to auscultation bilaterally Chest wall: wounds are clean, healing well Cardio: regular rate and rhythm GI: normal findings: bowel sounds normal and soft, non-tender Skin: petechiae - generalized  Lab Results  Recent Labs  09/07/14 0358 09/08/14 0351  WBC 4.3 3.7*  HGB 7.4* 7.2*  HCT 23.1* 22.1*  NA 141 140  K 3.9 3.3*  CL 114* 111  CO2 19* 20*  BUN 23* 21*  CREATININE 1.88* 1.90*   Liver Panel  Recent Labs  09/07/14 0358 09/08/14 0351  PROT 8.0 7.5  ALBUMIN 2.2* 2.2*  AST 20 18  ALT 29 27  ALKPHOS 101 107  BILITOT 0.8 0.7   Sedimentation Rate No results for input(s): ESRSEDRATE in the last 72 hours. C-Reactive Protein No results for input(s): CRP in the last 72 hours.  Microbiology: Recent Results (from the past 240 hour(s))  Culture, blood (routine x 2)     Status: None   Collection Time: 09/02/14  8:10 AM  Result Value Ref Range Status   Specimen Description BLOOD RIGHT ARM  Final   Special Requests BOTTLES DRAWN AEROBIC AND ANAEROBIC 10CC  Final   Culture   Final    NO GROWTH 5 DAYS Performed at [facility]    Report Status 09/07/2014 FINAL  Final  Culture, blood (routine x 2)     Status: None (Preliminary result)   Collection Time: 09/02/14  8:15 AM  Result Value Ref Range Status   Specimen Description BLOOD RIGHT HAND  Final   Special Requests BOTTLES DRAWN AEROBIC AND ANAEROBIC 5CC  Final   Culture  Setup Time   Final    GRAM NEGATIVE RODS ANAEROBIC BOTTLE ONLY CRITICAL RESULT CALLED TO, READ BACK BY AND VERIFIED WITH: Nehemiah Settle RN @ [phone] ON [id number] BY Rhea Bleacher    Culture   Final    SALMONELLA SPECIES CRITICAL RESULT CALLED TO, READ BACK BY AND VERIFIED WITH: R JOHNSON 09/05/14 @ 0951 Addison Lank Performed at [facility]    Report Status PENDING  Incomplete   Organism ID,  Bacteria SALMONELLA SPECIES  Final      Susceptibility   Salmonella species - MIC*    AMPICILLIN <=2 SENSITIVE Sensitive     LEVOFLOXACIN <=0.12 SENSITIVE Sensitive     TRIMETH/SULFA <=20 SENSITIVE Sensitive     * SALMONELLA  SPECIES  MRSA PCR Screening     Status: None   Collection Time: 09/02/14  3:35 PM  Result Value Ref Range Status   MRSA by PCR NEGATIVE NEGATIVE Final    Comment:        The GeneXpert MRSA Assay (FDA approved for NASAL specimens only), is one component of a comprehensive MRSA colonization surveillance program. It is not intended to diagnose MRSA infection nor to guide or monitor treatment for MRSA infections.   CSF culture with Stat gram stain     Status: None   Collection Time: 09/02/14  5:20 PM  Result Value Ref Range Status   Specimen Description CSF  Final   Special Requests Immunocompromised  Final   Gram Stain   Final    CYTOSPIN SMEAR NO WBC SEEN NO ORGANISMS SEEN Gram Stain Report Called to,Read Back By and Verified With: PAM Margarito Courser [id number] @ 1924 BY J SCOTTON    Culture   Final    NO GROWTH 3 DAYS Performed at [facility]    Report Status 09/06/2014 FINAL  Final  AFB culture with smear     Status: None (Preliminary result)   Collection Time: 09/02/14  5:20 PM  Result Value Ref Range Status   Specimen Description CSF  Final   Special Requests Immunocompromised  Final   Acid Fast Smear   Final    NO ACID FAST BACILLI SEEN Performed at [facility]    Culture   Final    CULTURE WILL BE EXAMINED FOR 6 WEEKS BEFORE ISSUING A FINAL REPORT Performed at [facility]    Report Status PENDING  Incomplete  Fungus Culture with Smear     Status: None (Preliminary result)   Collection Time: 09/02/14  5:20 PM  Result Value Ref Range Status   Specimen Description CSF  Final   Special Requests Immunocompromised  Final   Fungal Smear   Final    NO YEAST OR FUNGAL ELEMENTS SEEN Performed at [facility]    Culture    Final    CULTURE IN PROGRESS FOR FOUR WEEKS Performed at [facility]    Report Status PENDING  Incomplete  Gram stain     Status: None   Collection Time: 09/02/14  5:20 PM  Result Value Ref Range Status   Specimen Description CSF  Final   Special Requests NONE  Final   Gram Stain   Final    NO ORGANISMS SEEN NO WBC SEEN CYTOSPUN SMEAR Gram Stain Report Called to,Read Back By and Verified With: PAM WEST,RN [id number] @ 1924 BY J SCOTTON    Report Status 09/02/2014 FINAL  Final  Clostridium Difficile by PCR (not at [facility])     Status: None   Collection Time: 09/05/14  2:53 AM  Result Value Ref Range Status   C difficile by pcr NEGATIVE NEGATIVE Final  Cath Tip Culture     Status: None   Collection Time: 09/06/14  8:37 AM  Result Value Ref Range Status   Specimen Description CATH TIP  Final   Special Requests NONE  Final   Culture   Final    NO GROWTH 2 DAYS Performed at [facility]    Report Status 09/08/2014 FINAL  Final    Studies/Results: No results found.   Assessment/Plan: Multiple Myeloma  BMT 2009  Last CTX 08-29-14 Anenia Neutropenic Fever Salmonella Sepsis (09-02-14)  Repeat BCx 6-26 ngtd Port removed 09-06-14 Protein Calorie malnutrition, severe ? ADR,  rash  Total days of antibiotics: 6  levaquin 6-23  Will add fluconazole He is doing better No change in levaquin for now.  Improve BP and nutrition watch wound healing Watch thrush         Bobby Rumpf Infectious Diseases (pager) [phone] [email or web address] 09/08/2014, 3:06 PM  LOS: 7 days

## 2014-09-08 NOTE — Progress Notes (Signed)
[facility] PROGRESS NOTE  Walter Dennis YIR:[id number] DOB: Oct 22, 1950 DOA: 09/01/2014 PCP: Effie Berkshire A  Brief Summary from prior PN  The patient is a 64 yo M with history of multiple myeloma, IgG-lambda dx 2008 s/p autologous stem cell transplant 2009, most recently with disease progression on carfilzomib, lenalidomide, and dexamethasone.  He started bendamustine and pomalidomide on 08/29/2014.  He has history of HTN.  Per patient, he has had fevers, chills, night sweats attributed to his MM for the last year.  He will rigor and have associated SOB which typically resolves on its own.  Separate from these symptoms, he has had intermittent severe exertional chest pain which radiates to both shoulders which is severe and associated with SOB, mild nausea, significant lightheadedness and diaphoresis.  Over the last few weeks, he has had difficulty walking up a flight of stairs before becoming SOB.  The last few days, he cannot walk across a room before he develops chest pain.  Pain is relieved by rest.  He had a CT scan which demonstrated no evidence of pneumonia or PE but did demonstrated three vessel calcifications.  Per oncology, his chemotherapy does not typically cause myocarditis or pericarditis or acute heart failure.  Cardiology has been consulted for possible unstable angina.  I have notified oncology at [facility] of patient's admission and Dr. Benay Spice has been added to the rounding list here at [facility].    Assessment/Plan   Sepsis with fever, tachycardia, tachypnea, AMS - AMS resolved and most likely secondary to infectious etiology. - sepsis resolving, ID on board and assisting  Gram negative bacteremia - Salmonella species  - Pt improving on Levaquin - Discussed case with ID who recommended port a cath removal. Subsequently consulted General surgery. Would like to thank ID and General Surgery for their assistance. Port a Cath removed 09/06/14   Atrial fibrillation - Cardiology  managing. 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  He started bendamustine and pomalidomide on 08/29/2014.  -  Agree with holding chemotherapy pending further investigation for infection -  Oncology will be available if any questions arise for now they have signed off   Pancytopenia due to MM and chemotherapy -  WBC  Within normal limits - platelets trending up - Worsening anemia in context of patient with MM and recent operation. Transfuse if active bleeding occurs or hgb < 7.0    Hyponatremia may be due to poor oral solute intake -  Stable at 133  Acute urinary retention, foley catheter placed  Diet:  NPO Access:  PIV IVF:  resume Proph: heparin  Code Status: full Family Communication: patient  Disposition Plan: Patient has been switched over to oral medications for management of heart rate. Should heart rate remained controlled for the next 24 hours with plan on transitioning to floor   Consultants:  Cardiology  ID  Neurology  General surgery  Procedures:  CT angio chest  Antibiotics:  levaquin  Metronidazole  HPI/Subjective: Pt has no new complaints  Objective: Filed Vitals:   09/07/14 2000 09/08/14 0000 09/08/14 0400 09/08/14 0800  BP: 123/50 157/68 169/70 164/82  Pulse:      Temp: 98.7 F (37.1 C)  98.9 F (37.2 C) 99.2 F (37.3 C) 99.4 F (37.4 C)  TempSrc:  Oral Oral Oral  Resp: _0 Height:      Weight:      SpO2: 98% 97% 98% 100%    Intake/Output Summary (Last 24 hours) at 09/08/14 1032 Last data filed at 09/08/14 0830  Gross per 24 hour  Intake 2624.07 ml  Output    2550 ml  Net  74.07 ml   Filed Weights   09/01/14 1902 09/02/14 1300 09/03/14 0400  Weight: 99.791 kg (220 lb) 102.5 kg (225 lb 15.5 oz) 99 kg (218 lb 4.1 oz)    Exam: Initial:    General:  Awake and alert, in nad  HEENT:  No masses on visual examination, MMM  Cardiovascular:  No cyanosis, warm and pink extremities  Respiratory:  No increased wob, no wheezes, equal chest rise  Abdomen:   NABS, soft, NT/ND  MSK:   Normal tone and bulk, no LEE  Neuro:  Answers questions appropriately, no facial asymetry   Data Reviewed: Basic Metabolic Panel:  Recent Labs Lab 09/04/14 0558 09/05/14 0500 09/06/14 0520 09/07/14 0358 09/08/14 0351  NA 133* 133* 139 141 140  K 3.6 3.7 3.6 3.9 3.3*  CL 105 107 113* 114* 111  CO2 19* 18* 18* 19* 20*  GLUCOSE 121* 145* 121* 114* 144*  BUN 17 16 21* 23* 21*  CREATININE 1.53* 1.55* 1.84* 1.88* 1.90*  CALCIUM 7.5* 7.8* 8.4* 9.3 10.1  MG 2.4 2.0 2.1 2.2 2.1  PHOS 2.0*  --   --   --   --    Liver Function Tests:  Recent Labs Lab 09/04/14 0558 09/05/14 0500 09/06/14 0520 09/07/14 0358 09/08/14 0351  AST _1 ALT 40 36 32 29 27  ALKPHOS 85 91 100 101 107  BILITOT 0.8 0.6 1.0 0.8 0.7  PROT 7.6 7.8 7.6 8.0 7.5  ALBUMIN 2.3* 2.2* 2.1* 2.2* 2.2*   No results for input(s): LIPASE, AMYLASE in the last 168 hours.  Recent Labs Lab 09/03/14 0510  AMMONIA 44*   CBC:  Recent Labs Lab 09/04/14 0558 09/05/14 0500 09/06/14 0520 09/07/14 0358 09/08/14 0351  WBC 2.3* 4.2 4.8 4.3 3.7*  NEUTROABS 1.9 3.3 3.2 2.5 2.0  HGB 8.0* 8.2* 7.6* 7.4* 7.2*  HCT 24.1* 25.0* 23.5* 23.1* 22.1*  MCV 87.3 87.4 86.7 87.8 88.8  PLT 95* 95* 105* 100* 93*   Cardiac Enzymes:  Recent Labs Lab 09/01/14 2005 09/01/14 2300 09/02/14 0134  TROPONINI 0.08* 0.09* 0.09*   BNP (last 3 results)  Recent Labs  09/01/14 1359  BNP 452.2*    ProBNP (last 3 results) No results for input(s): PROBNP in the last 8760 hours.  CBG: No  results for input(s): GLUCAP in the last 168 hours.  Recent Results (from the past 240 hour(s))  Culture, blood (routine x 2)     Status: None   Collection Time: 09/02/14  8:10 AM  Result Value Ref Range Status   Specimen Description BLOOD RIGHT ARM  Final   Special Requests BOTTLES DRAWN AEROBIC AND ANAEROBIC 10CC  Final   Culture   Final    NO GROWTH 5 DAYS Performed at [facility]    Report Status 09/07/2014 FINAL  Final  Culture, blood (routine x 2)     Status: None (Preliminary result)   Collection Time: 09/02/14  8:15 AM  Result Value Ref Range Status   Specimen Description BLOOD  RIGHT HAND  Final   Special Requests BOTTLES DRAWN AEROBIC AND ANAEROBIC 5CC  Final   Culture  Setup Time   Final    GRAM NEGATIVE RODS ANAEROBIC BOTTLE ONLY CRITICAL RESULT CALLED TO, READ BACK BY AND VERIFIED WITH: Nehemiah Settle RN @ [phone] ON [id number] BY Rhea Bleacher    Culture   Final    SALMONELLA SPECIES CRITICAL RESULT CALLED TO, READ BACK BY AND VERIFIED WITH: R JOHNSON 09/05/14 @ 0951 M VESTAL Performed at [facility]    Report Status PENDING  Incomplete   Organism ID, Bacteria SALMONELLA SPECIES  Final      Susceptibility   Salmonella species - MIC*    AMPICILLIN <=2 SENSITIVE Sensitive     LEVOFLOXACIN <=0.12 SENSITIVE Sensitive     TRIMETH/SULFA <=20 SENSITIVE Sensitive     * SALMONELLA SPECIES  MRSA PCR Screening     Status: None   Collection Time: 09/02/14  3:35 PM  Result Value Ref Range Status   MRSA by PCR NEGATIVE NEGATIVE Final    Comment:        The GeneXpert MRSA Assay (FDA approved for NASAL specimens only), is one component of a comprehensive MRSA colonization surveillance program. It is not intended to diagnose MRSA infection nor to guide or monitor treatment for MRSA infections.   CSF culture with Stat gram stain     Status: None   Collection Time: 09/02/14  5:20 PM  Result Value Ref Range Status   Specimen Description CSF  Final   Special  Requests Immunocompromised  Final   Gram Stain   Final    CYTOSPIN SMEAR NO WBC SEEN NO ORGANISMS SEEN Gram Stain Report Called to,Read Back By and Verified With: PAM Margarito Courser [id number] @ 1924 BY J SCOTTON    Culture   Final    NO GROWTH 3 DAYS Performed at [facility]    Report Status 09/06/2014 FINAL  Final  AFB culture with smear     Status: None (Preliminary result)   Collection Time: 09/02/14  5:20 PM  Result Value Ref Range Status   Specimen Description CSF  Final   Special Requests Immunocompromised  Final   Acid Fast Smear   Final    NO ACID FAST BACILLI SEEN Performed at [facility]    Culture   Final    CULTURE WILL BE EXAMINED FOR 6 WEEKS BEFORE ISSUING A FINAL REPORT Performed at [facility]    Report Status PENDING  Incomplete  Fungus Culture with Smear     Status: None (Preliminary result)   Collection Time: 09/02/14  5:20 PM  Result Value Ref Range Status   Specimen Description CSF  Final   Special Requests Immunocompromised  Final   Fungal Smear   Final    NO YEAST OR FUNGAL ELEMENTS SEEN Performed at [facility]    Culture   Final    CULTURE IN PROGRESS FOR FOUR WEEKS Performed at [facility]    Report Status PENDING  Incomplete  Gram stain     Status: None   Collection Time: 09/02/14  5:20 PM  Result Value Ref Range Status   Specimen Description CSF  Final   Special Requests NONE  Final   Gram Stain   Final    NO ORGANISMS SEEN NO WBC SEEN CYTOSPUN SMEAR Gram Stain Report Called to,Read Back By and Verified With: PAM WEST,RN [id number] @ 1924 BY J SCOTTON    Report Status  09/02/2014 FINAL  Final  Clostridium Difficile by PCR (not at [facility])     Status: None   Collection Time: 09/05/14  2:53 AM  Result Value Ref Range Status   C difficile by pcr NEGATIVE NEGATIVE Final  Cath Tip Culture     Status: None   Collection Time: 09/06/14  8:37 AM  Result Value Ref Range Status   Specimen Description CATH TIP  Final    Special Requests NONE  Final   Culture   Final    NO GROWTH 2 DAYS Performed at [facility]    Report Status 09/08/2014 FINAL  Final     Studies: No results found.  Scheduled Meds: .  stroke: mapping our early stages of recovery book   Does not apply Once  . allopurinol  300 mg Oral Daily  . amiodarone  400 mg Oral BID  . antiseptic oral rinse  7 mL Mouth Rinse BID  . aspirin  81 mg Oral Daily  . atorvastatin  80 mg Oral q1800  . carvedilol  12.5 mg Oral BID WC  . feeding supplement (PRO-STAT 64)  30 mL Oral BID BM  . fentaNYL  50 mcg Transdermal Q72H  . heparin subcutaneous  5,000 Units Subcutaneous 3 times per day  . levofloxacin (LEVAQUIN) IV  750 mg Intravenous Q24H  . pantoprazole  80 mg Oral Q1200  . pregabalin  75 mg Oral BID  . sodium chloride  3 mL Intravenous Q12H   Continuous Infusions: . sodium chloride Stopped (09/07/14 1000)  . dextrose 5 % and 0.45 % NaCl with KCl 20 mEq/L 100 mL/hr at 09/08/14 0700  . norepinephrine (LEVOPHED) Adult infusion Stopped (09/04/14 1630)    Principal Problem:   Chest pain Active Problems:   Multiple myeloma   Essential hypertension   CKD (chronic kidney disease) stage 3, GFR 30-59 ml/min   GERD (gastroesophageal reflux disease)   Abdominal pain, chronic, epigastric   Cancer   FUO (fever of unknown origin)   Lethargy   Tachycardia   Neutropenic fever   Screen for STD (sexually transmitted disease)   Aspergillus pneumonia   Dyspnea   Gram-negative bacteremia   Drug rash   Paroxysmal atrial fibrillation   Vomiting   Salmonella bacteremia    Time spent: 35 min    Velvet Bathe  [facility] Pager [phone] If 7PM-7AM, please contact night-coverage at [email or web address], password [facility] 09/08/2014, 10:32 AM  LOS: 7 days

## 2014-09-08 NOTE — Progress Notes (Signed)
OT Cancellation Note  Patient Details Name: Jahzir Strohmeier MRN: [id number] DOB: 04-Mar-1951   Cancelled Treatment:    Reason Eval/Treat Not Completed: Other (comment) Spoke with pt. Pt feels he is ok with being able to perform ADL activity . Son confirmed. Pt does not feel there are OT needs at this time. Will sing off  Lin Landsman, Mickel Baas, [place] [phone] 09/08/2014, 12:53 PM

## 2014-09-08 NOTE — Progress Notes (Signed)
Date:  September 08, 2014 U.R. performed for needs and level of care. Continues on IV Cardizem drip Will continue to follow for Case Management needs.  Velva Harman, RN, BSN, [place]   [phone]

## 2014-09-08 NOTE — Progress Notes (Signed)
2 Days Post-Op  Subjective: Sites are sore but look fine.  He has glue and I told him he could shower from our standpoint and the glue would come off in time.    Objective: Vital signs in last 24 hours: Temp:  [98.2 F (36.8 C)-99.5 F (37.5 C)] 99.2 F (37.3 C) (06/27 0400) Resp:  [16-40] 17 (06/27 0400) BP: (123-182)/(50-104) 169/70 mmHg (06/27 0400) SpO2:  [95 %-99 %] 98 % (06/27 0400) Last BM Date: 09/06/14 Diet: cardiac + BM TM 100.6 K+ 3.3, creatinine is up to 1.9 CBC about the same Intake/Output from previous day: 06/26 0701 - 06/27 0700 In: 2757.5 [I.V.:2607.5; IV Piggyback:150] Out: 1700 [Urine:1700] Intake/Output this shift:    General appearance: alert, cooperative, no distress and reports he feels better. Skin: port sites OK.  Lab Results:   Recent Labs  09/07/14 0358 09/08/14 0351  WBC 4.3 3.7*  HGB 7.4* 7.2*  HCT 23.1* 22.1*  PLT 100* 93*    BMET  Recent Labs  09/07/14 0358 09/08/14 0351  NA 141 140  K 3.9 3.3*  CL 114* 111  CO2 19* 20*  GLUCOSE 114* 144*  BUN 23* 21*  CREATININE 1.88* 1.90*  CALCIUM 9.3 10.1   PT/INR  Recent Labs  09/07/14 0358 09/08/14 0351  LABPROT 15.7* 16.0*  INR 1.23 1.26     Recent Labs Lab 09/04/14 0558 09/05/14 0500 09/06/14 0520 09/07/14 0358 09/08/14 0351  AST 24 27 19 20 18   ALT 40 36 32 29 27  ALKPHOS 85 91 100 101 107  BILITOT 0.8 0.6 1.0 0.8 0.7  PROT 7.6 7.8 7.6 8.0 7.5  ALBUMIN 2.3* 2.2* 2.1* 2.2* 2.2*     Lipase  No results found for: LIPASE   Studies/Results: No results found.  Medications: .  stroke: mapping our early stages of recovery book   Does not apply Once  . allopurinol  300 mg Oral Daily  . antiseptic oral rinse  7 mL Mouth Rinse BID  . aspirin  81 mg Oral Daily  . atorvastatin  80 mg Oral q1800  . carvedilol  6.25 mg Oral BID WC  . feeding supplement (PRO-STAT 64)  30 mL Oral BID BM  . fentaNYL  50 mcg Transdermal Q72H  . heparin subcutaneous  5,000 Units  Subcutaneous 3 times per day  . levofloxacin (LEVAQUIN) IV  750 mg Intravenous Q24H  . pantoprazole  80 mg Oral Q1200  . pregabalin  75 mg Oral BID  . sodium chloride  3 mL Intravenous Q12H    Assessment/Plan Infected Port-A-Cath. Removal of infected Port-A-Cath., Dr. Coralie Keens, 09/06/14, POD# 2 Salmonella positive blood culture x 1 C diff negative Renal insuffiencey  Hypokalemia Multiple Myeloma Hypertension Antibiotics:  Day 5 Levofloxacin DVT:  Heparin Pancytopenia   Plan:  Nothing further to add currently, please call if we can help.  He can be referred back thru office when you are ready for a new port.     LOS: 7 days    Walter Dennis 09/08/2014

## 2014-09-08 NOTE — Progress Notes (Signed)
Physical Therapy Treatment Patient Details Name: Walter Dennis MRN: [id number] DOB: 06/16/1950 Today's Date: 09/08/2014    History of Present Illness 64 yo male admitted with exertional chest pain, new LBBB, sepsis, dyspnea, + troponins. Hx of myeloma, HTN. Pt was Ind PTA    PT Comments    Patient ambulated x  220' with HHA, gait slow. Patient happy about walking  Follow Up Recommendations  No PT follow up     Equipment Recommendations  None recommended by PT    Recommendations for Other Services       Precautions / Restrictions Precautions Precautions: Fall    Mobility  Bed Mobility                  Transfers Overall transfer level: Needs assistance Equipment used: 1 person hand held assist Transfers: Sit to/from Stand Sit to Stand: Min guard         General transfer comment: close guard for safety  Ambulation/Gait Ambulation/Gait assistance: Min guard Ambulation Distance (Feet): 220 Feet Assistive device: 1 person hand held assist Gait Pattern/deviations: Step-through pattern Gait velocity: slow   General Gait Details: held onto IV pole and son's hand.   Stairs            Wheelchair Mobility    Modified Rankin (Stroke Patients Only)       Balance Overall balance assessment: Needs assistance         Standing balance support: During functional activity;No upper extremity supported Standing balance-Leahy Scale: Fair                      Cognition Arousal/Alertness: Awake/alert                          Exercises      General Comments        Pertinent Vitals/Pain Pain Score: 0-No pain    Home Living                      Prior Function            PT Goals (current goals can now be found in the care plan section) Progress towards PT goals: Progressing toward goals    Frequency  Min 3X/week    PT Plan Current plan remains appropriate    Co-evaluation             End of Session    Activity Tolerance: Patient tolerated treatment well Patient left: in chair;with call bell/phone within reach;with family/visitor present     Time: 7048-8891 PT Time Calculation (min) (ACUTE ONLY): 13 min  Charges:  $Gait Training: 8-22 mins                    G Codes:      Claretha Cooper 09/08/2014, 1:04 PM Tresa Endo PT [phone]

## 2014-09-09 LAB — CBC WITH DIFFERENTIAL/PLATELET
BASOS PCT: 1 % (ref 0–1)
Basophils Absolute: 0 10*3/uL (ref 0.0–0.1)
EOS ABS: 0.3 10*3/uL (ref 0.0–0.7)
Eosinophils Relative: 8 % — ABNORMAL HIGH (ref 0–5)
HCT: 21.7 % — ABNORMAL LOW (ref 39.0–52.0)
Hemoglobin: 6.7 g/dL — CL (ref 13.0–17.0)
LYMPHS PCT: 23 % (ref 12–46)
Lymphs Abs: 0.9 10*3/uL (ref 0.7–4.0)
MCH: 27.5 pg (ref 26.0–34.0)
MCHC: 30.9 g/dL (ref 30.0–36.0)
MCV: 88.9 fL (ref 78.0–100.0)
MONO ABS: 0.6 10*3/uL (ref 0.1–1.0)
Monocytes Relative: 16 % — ABNORMAL HIGH (ref 3–12)
Neutro Abs: 1.9 10*3/uL (ref 1.7–7.7)
Neutrophils Relative %: 52 % (ref 43–77)
PLATELETS: 99 10*3/uL — AB (ref 150–400)
RBC: 2.44 MIL/uL — AB (ref 4.22–5.81)
RDW: 18.8 % — ABNORMAL HIGH (ref 11.5–15.5)
WBC: 3.7 10*3/uL — AB (ref 4.0–10.5)

## 2014-09-09 LAB — COMPREHENSIVE METABOLIC PANEL
ALK PHOS: 134 U/L — AB (ref 38–126)
ALT: 27 U/L (ref 17–63)
AST: 28 U/L (ref 15–41)
Albumin: 2 g/dL — ABNORMAL LOW (ref 3.5–5.0)
Anion gap: 7 (ref 5–15)
BILIRUBIN TOTAL: 0.6 mg/dL (ref 0.3–1.2)
BUN: 16 mg/dL (ref 6–20)
CALCIUM: 9.6 mg/dL (ref 8.9–10.3)
CO2: 21 mmol/L — AB (ref 22–32)
Chloride: 111 mmol/L (ref 101–111)
Creatinine, Ser: 1.74 mg/dL — ABNORMAL HIGH (ref 0.61–1.24)
GFR calc Af Amer: 47 mL/min — ABNORMAL LOW (ref >60)
GFR, EST NON AFRICAN AMERICAN: 40 mL/min — AB (ref >60)
Glucose, Bld: 121 mg/dL — ABNORMAL HIGH (ref 65–99)
POTASSIUM: 3.5 mmol/L (ref 3.5–5.1)
Sodium: 139 mmol/L (ref 135–145)
Total Protein: 7.4 g/dL (ref 6.5–8.1)

## 2014-09-09 LAB — CBC
HCT: 24.5 % — ABNORMAL LOW (ref 39.0–52.0)
HEMOGLOBIN: 7.9 g/dL — AB (ref 13.0–17.0)
MCH: 28.6 pg (ref 26.0–34.0)
MCHC: 32.2 g/dL (ref 30.0–36.0)
MCV: 88.8 fL (ref 78.0–100.0)
Platelets: 105 10*3/uL — ABNORMAL LOW (ref 150–400)
RBC: 2.76 MIL/uL — AB (ref 4.22–5.81)
RDW: 18.1 % — AB (ref 11.5–15.5)
WBC: 3.4 10*3/uL — AB (ref 4.0–10.5)

## 2014-09-09 LAB — ABO/RH: ABO/RH(D): A POS

## 2014-09-09 LAB — PROTIME-INR
INR: 1.16 (ref 0.00–1.49)
Prothrombin Time: 15 seconds (ref 11.6–15.2)

## 2014-09-09 LAB — ROCKY MTN SPOTTED FVR ABS PNL(IGG+IGM)
RMSF IGG: NEGATIVE
RMSF IgM: 0.16 index (ref 0.00–0.89)

## 2014-09-09 LAB — PREPARE RBC (CROSSMATCH)

## 2014-09-09 LAB — MAGNESIUM: Magnesium: 1.9 mg/dL (ref 1.7–2.4)

## 2014-09-09 MED ORDER — ENSURE ENLIVE PO LIQD
237.0000 mL | Freq: Two times a day (BID) | ORAL | Status: DC
  Administered 2014-09-09 – 2014-09-13 (×6): 237 mL via ORAL

## 2014-09-09 MED ORDER — SODIUM CHLORIDE 0.9 % IV SOLN
Freq: Once | INTRAVENOUS | Status: AC
Start: 2014-09-09 — End: 2014-09-11
  Administered 2014-09-09: 09:00:00 via INTRAVENOUS

## 2014-09-09 NOTE — Progress Notes (Signed)
Patient Name: Walter Dennis Date of Encounter: 09/09/2014  Principal Problem:   Chest pain Active Problems:   Multiple myeloma   Essential hypertension   CKD (chronic kidney disease) stage 3, GFR 30-59 ml/min   GERD (gastroesophageal reflux disease)   Abdominal pain, chronic, epigastric   Cancer   FUO (fever of unknown origin)   Lethargy   Tachycardia   Neutropenic fever   Screen for STD (sexually transmitted disease)   Aspergillus pneumonia   Dyspnea   Gram-negative bacteremia   Drug rash   Paroxysmal atrial fibrillation   Vomiting   Salmonella bacteremia   Protein-calorie malnutrition, severe   Thrush of mouth and esophagus   Primary Cardiologist: Nahser  Patient Profile: 64 yo male w/ mult myeloma (chemo), HTN, Aspergillosis, and chest pain (neg MV 2014), was admitted 06/20 w/ chest pain. Sepsis w/ infected port-a-cath removed 06/25. Blood cx + Salmonella. Afib 06/26.  SUBJECTIVE: Feels better, chest pain resolved with removal of port-a-cath. No palpitations overnight. Feels he has turned a corner. Still no appetite, getting rx for nausea.  OBJECTIVE Filed Vitals:   09/08/14 2231 09/09/14 0000 09/09/14 0216 09/09/14 0400  BP:  146/60  173/71  Pulse:    75  Temp: 98.9 F (37.2 C)   99.2 F (37.3 C)  TempSrc: Oral   Oral  Resp: 16 20 22 17   Height:      Weight:      SpO2: 100% 97% 97% 100%    Intake/Output Summary (Last 24 hours) at 09/09/14 0737 Last data filed at 09/09/14 0600  Gross per 24 hour  Intake   2873 ml  Output   2500 ml  Net    373 ml   Filed Weights   09/01/14 1902 09/02/14 1300 09/03/14 0400  Weight: 220 lb (99.791 kg) 225 lb 15.5 oz (102.5 kg) 218 lb 4.1 oz (99 kg)    PHYSICAL EXAM General: Well developed, well nourished, male in no acute distress. Head: Normocephalic, atraumatic.  Neck: Supple without bruits, JVD not elevated. Lungs:  Resp regular and unlabored, few dry rales, good air exchange. Heart: RRR, S1, S2, no S3, S4,  soft murmur; no rub. Abdomen: Soft, non-tender, non-distended, BS + x 4.  Extremities: No clubbing, cyanosis, no edema.  Neuro: Alert and oriented X 3. Moves all extremities spontaneously. Psych: Normal affect.  LABS: CBC: Recent Labs  09/08/14 0351 09/09/14 0340  WBC 3.7* 3.7*  NEUTROABS 2.0 1.9  HGB 7.2* 6.7*  HCT 22.1* 21.7*  MCV 88.8 88.9  PLT 93* 99*   INR: Recent Labs  09/09/14 0340  INR 8.88   Basic Metabolic Panel: Recent Labs  09/08/14 0351 09/09/14 0340  NA 140 139  K 3.3* 3.5  CL 111 111  CO2 20* 21*  GLUCOSE 144* 121*  BUN 21* 16  CREATININE 1.90* 1.74*  CALCIUM 10.1 9.6  MG 2.1 1.9   Liver Function Tests: Recent Labs  09/08/14 0351 09/09/14 0340  AST 18 28  ALT 27 27  ALKPHOS 107 134*  BILITOT 0.7 0.6  PROT 7.5 7.4  ALBUMIN 2.2* 2.0*   BNP:  B NATRIURETIC PEPTIDE  Date/Time Value Ref Range Status  09/01/2014 01:59 PM 452.2* 0.0 - 100.0 pg/mL Final   TELE:  SR, ST, PVCs and rare pairs. Last afib episode was 06/27 am.     Current Medications:  .  stroke: mapping our early stages of recovery book   Does not apply Once  . sodium chloride  Intravenous Once  . allopurinol  300 mg Oral Daily  . amiodarone  400 mg Oral BID  . antiseptic oral rinse  7 mL Mouth Rinse BID  . aspirin  81 mg Oral Daily  . atorvastatin  80 mg Oral q1800  . carvedilol  12.5 mg Oral BID WC  . feeding supplement (PRO-STAT 64)  30 mL Oral BID BM  . fentaNYL  50 mcg Transdermal Q72H  . heparin subcutaneous  5,000 Units Subcutaneous 3 times per day  . levofloxacin (LEVAQUIN) IV  750 mg Intravenous Q24H  . nystatin  5 mL Oral QID  . pantoprazole  80 mg Oral Q1200  . pregabalin  75 mg Oral BID  . sodium chloride  3 mL Intravenous Q12H   . sodium chloride Stopped (09/07/14 1000)  . dextrose 5 % and 0.45 % NaCl with KCl 20 mEq/L 100 mL/hr at 09/08/14 2000  . norepinephrine (LEVOPHED) Adult infusion Stopped (09/04/14 1630)    ASSESSMENT AND PLAN:  1. Chest  pain/dyspnea - concerning for unstable angina - new LBBB, borderline flat trop, coronary calcification noted on CT (previously neg cath 2012 in [place] and neg NST 10/2012) - LHC is indicated. However, the patient is undergoing extensive w/u by ID and neurology given worsening pancytopenia, fever and recent confusion. Will need to delay cath until his other medical issues improve, monitor renal function and hgb - echo with Ef 45-50% mildly reduced, last nuc 07/2013 his EF 54% - continue ASA, coreg, lipitor. No ACEI or ARB with AKI  - MD advise on nuc for risk stratification prior to d/c since sx have resolved, vs cath prior to d/c, vs f/u with Dr Acie Fredrickson and discuss workup with him.   - recheck echo in 3 months.  2. PAF with RVR - now back in NSR - CHA2DS2VASC score 2 - occurred in the setting of infection, will hold off on starting systemic anticoagulation as he is a poor candidate  - had been going in and out of a-fib, coreg increased 06/27, loaded with IV amio 06/23>06/27, now on PO amiodarone 444m BID for 7 days (06/27>07/04), then 2077mBID for a month, then 20038maily afterward (hopefully can discontinue at some point)  - However, pt has had more nausea last 24 hours, may need to decrease dose sooner, follow sx.  3. AMS: lumbar puncture 6/21 (neg csf cx) - in the setting of multiple medical issues, including multiple myeloma, chemotherapy, worsening pancytopenia and fever  4. Multiple myeloma with pancytopenia  - per IM  5. Rash without pruiritis suspect due to beta lactams and zosyn  - per IM  6. Septic shock related to Salmonella bacteremia: port-a-cath removed by Surgery in OR 09/06/2014 - shock resolved.  7. Acute renal insufficiency  - nephrotoxic agents removed including vanc  Otherwise, per IM Active Problems:   Essential hypertension   CKD (chronic kidney disease)  stage 3, GFR 30-59 ml/min   GERD (gastroesophageal reflux disease)   Abdominal pain, chronic, epigastric   Cancer   FUO (fever of unknown origin)   Lethargy   Tachycardia   Neutropenic fever   Screen for STD (sexually transmitted disease)   Aspergillus pneumonia   Dyspnea   Gram-negative bacteremia   Vomiting   Salmonella bacteremia   Protein-calorie malnutrition, severe   Thrush of mouth and esophagus   Signed, Barrett, [address] AM 09/09/2014   History and all data above reviewed.  Patient examined.  I agree with the findings  as above.  He feels much better.  No chest pain or SOB The patient exam reveals COR:RRR  ,  Lungs: .clear,  Abd: Positive bowel sounds, no rebound no guarding, Ext No edema  .  All available labs, radiology testing, previous records reviewed. Agree with documented assessment and plan.   Atrial fib:  Rhythm has been sinus.  Continue amiodarone bid for now.  Likely will reduce to 400 mg daily at discharge.  He can have any further cardiac work up as an out patient.  We will follow in hospital as needed.   Jeneen Rinks [facility]  10:39 AM  09/09/2014

## 2014-09-09 NOTE — Progress Notes (Signed)
INFECTIOUS DISEASE PROGRESS NOTE  ID: Walter Dennis is a 64 y.o. male with  Principal Problem:   Chest pain Active Problems:   Multiple myeloma   Essential hypertension   CKD (chronic kidney disease) stage 3, GFR 30-59 ml/min   GERD (gastroesophageal reflux disease)   Abdominal pain, chronic, epigastric   Cancer   FUO (fever of unknown origin)   Lethargy   Tachycardia   Neutropenic fever   Screen for STD (sexually transmitted disease)   Aspergillus pneumonia   Dyspnea   Gram-negative bacteremia   Drug rash   Paroxysmal atrial fibrillation   Vomiting   Salmonella bacteremia   Protein-calorie malnutrition, severe   Thrush of mouth and esophagus  Subjective: Without complaints, eating better Feels like rash better  Abtx:  Anti-infectives    Start     Dose/Rate Route Frequency Ordered Stop   09/08/14 1530  fluconazole (DIFLUCAN) 40 MG/ML suspension 100 mg  Status:  Discontinued     100 mg Oral Daily 09/08/14 1519 09/08/14 1559   09/05/14 1000  doxycycline (VIBRAMYCIN) 100 mg in dextrose 5 % 250 mL IVPB  Status:  Discontinued     100 mg 125 mL/hr over 120 Minutes Intravenous Every 12 hours 09/05/14 0848 09/05/14 1004   09/04/14 1200  levofloxacin (LEVAQUIN) IVPB 750 mg     750 mg 100 mL/hr over 90 Minutes Intravenous Every 24 hours 09/04/14 1128     09/03/14 2330  cefTAZidime (FORTAZ) 2 g in dextrose 5 % 50 mL IVPB  Status:  Discontinued     2 g 100 mL/hr over 30 Minutes Intravenous 3 times per day 09/03/14 2324 09/04/14 1121   09/03/14 2330  metroNIDAZOLE (FLAGYL) IVPB 500 mg  Status:  Discontinued     500 mg 100 mL/hr over 60 Minutes Intravenous Every 8 hours 09/03/14 2324 09/05/14 1428   09/03/14 2200  piperacillin-tazobactam (ZOSYN) IVPB 3.375 g  Status:  Discontinued     3.375 g 12.5 mL/hr over 240 Minutes Intravenous 3 times per day 09/03/14 1801 09/03/14 2303   09/03/14 1500  metroNIDAZOLE (FLAGYL) tablet 500 mg  Status:  Discontinued     500 mg Oral 3 times per  day 09/03/14 1423 09/03/14 1754   09/02/14 2000  vancomycin (VANCOCIN) 1,250 mg in sodium chloride 0.9 % 250 mL IVPB  Status:  Discontinued     1,250 mg 166.7 mL/hr over 90 Minutes Intravenous Every 12 hours 09/02/14 0850 09/03/14 1754   09/02/14 1930  acyclovir (ZOVIRAX) 775 mg in dextrose 5 % 150 mL IVPB  Status:  Discontinued     775 mg 165.5 mL/hr over 60 Minutes Intravenous 3 times per day 09/02/14 1901 09/03/14 1754   09/02/14 1400  piperacillin-tazobactam (ZOSYN) IVPB 3.375 g  Status:  Discontinued     3.375 g 12.5 mL/hr over 240 Minutes Intravenous 3 times per day 09/02/14 0749 09/02/14 1209   09/02/14 1400  cefTAZidime (FORTAZ) 2 g in dextrose 5 % 50 mL IVPB  Status:  Discontinued     2 g 100 mL/hr over 30 Minutes Intravenous 3 times per day 09/02/14 1210 09/03/14 1754   09/02/14 0800  vancomycin (VANCOCIN) 1,250 mg in sodium chloride 0.9 % 250 mL IVPB     1,250 mg 166.7 mL/hr over 90 Minutes Intravenous STAT 09/02/14 0752 09/02/14 0930   09/02/14 0745  piperacillin-tazobactam (ZOSYN) IVPB 3.375 g     3.375 g 100 mL/hr over 30 Minutes Intravenous  Once 09/02/14 0737 09/02/14 0815  09/02/14 0745  vancomycin (VANCOCIN) IVPB 1000 mg/200 mL premix  Status:  Discontinued     1,000 mg 200 mL/hr over 60 Minutes Intravenous  Once 09/02/14 0737 09/02/14 0746      Medications:  Scheduled: . allopurinol  300 mg Oral Daily  . amiodarone  400 mg Oral BID  . antiseptic oral rinse  7 mL Mouth Rinse BID  . aspirin  81 mg Oral Daily  . atorvastatin  80 mg Oral q1800  . carvedilol  12.5 mg Oral BID WC  . feeding supplement (ENSURE ENLIVE)  237 mL Oral BID BM  . feeding supplement (PRO-STAT 64)  30 mL Oral BID BM  . fentaNYL  50 mcg Transdermal Q72H  . heparin subcutaneous  5,000 Units Subcutaneous 3 times per day  . levofloxacin (LEVAQUIN) IV  750 mg Intravenous Q24H  . nystatin  5 mL Oral QID  . pantoprazole  80 mg Oral Q1200  . pregabalin  75 mg Oral BID  . sodium chloride  3 mL  Intravenous Q12H    Objective: Vital signs in last 24 hours: Temp:  [98 F (36.7 C)-99.2 F (37.3 C)] 98.8 F (37.1 C) (06/28 1130) Pulse Rate:  [74-75] 75 (06/28 0400) Resp:  [15-23] 18 (06/28 1130) BP: (136-173)/(54-74) 136/69 mmHg (06/28 1130) SpO2:  [96 %-100 %] 99 % (06/28 1130)   General appearance: alert, cooperative and no distress Throat: abnormal findings: dry, thrush Resp: clear to auscultation bilaterally Cardio: regular rate and rhythm GI: normal findings: bowel sounds normal and soft, non-tender Skin: petechiae - generalized  Chest wounds clean  Lab Results  Recent Labs  09/08/14 0351 09/09/14 0340  WBC 3.7* 3.7*  HGB 7.2* 6.7*  HCT 22.1* 21.7*  NA 140 139  K 3.3* 3.5  CL 111 111  CO2 20* 21*  BUN 21* 16  CREATININE 1.90* 1.74*   Liver Panel  Recent Labs  09/08/14 0351 09/09/14 0340  PROT 7.5 7.4  ALBUMIN 2.2* 2.0*  AST 18 28  ALT 27 27  ALKPHOS 107 134*  BILITOT 0.7 0.6   Sedimentation Rate No results for input(s): ESRSEDRATE in the last 72 hours. C-Reactive Protein No results for input(s): CRP in the last 72 hours.  Microbiology: Recent Results (from the past 240 hour(s))  Culture, blood (routine x 2)     Status: None   Collection Time: 09/02/14  8:10 AM  Result Value Ref Range Status   Specimen Description BLOOD RIGHT ARM  Final   Special Requests BOTTLES DRAWN AEROBIC AND ANAEROBIC 10CC  Final   Culture   Final    NO GROWTH 5 DAYS Performed at [facility]    Report Status 09/07/2014 FINAL  Final  Culture, blood (routine x 2)     Status: None (Preliminary result)   Collection Time: 09/02/14  8:15 AM  Result Value Ref Range Status   Specimen Description BLOOD RIGHT HAND  Final   Special Requests BOTTLES DRAWN AEROBIC AND ANAEROBIC 5CC  Final   Culture  Setup Time   Final    GRAM NEGATIVE RODS ANAEROBIC BOTTLE ONLY CRITICAL RESULT CALLED TO, READ BACK BY AND VERIFIED WITH: Nehemiah Settle RN @ [phone] ON [id number] BY Rhea Bleacher    Culture   Final    SALMONELLA SPECIES CRITICAL RESULT CALLED TO, READ BACK BY AND VERIFIED WITH: R JOHNSON 09/05/14 @ 0951 Addison Lank Performed at [facility]    Report Status PENDING  Incomplete   Organism ID, Bacteria  SALMONELLA SPECIES  Final      Susceptibility   Salmonella species - MIC*    AMPICILLIN <=2 SENSITIVE Sensitive     LEVOFLOXACIN <=0.12 SENSITIVE Sensitive     TRIMETH/SULFA <=20 SENSITIVE Sensitive     * SALMONELLA SPECIES  MRSA PCR Screening     Status: None   Collection Time: 09/02/14  3:35 PM  Result Value Ref Range Status   MRSA by PCR NEGATIVE NEGATIVE Final    Comment:        The GeneXpert MRSA Assay (FDA approved for NASAL specimens only), is one component of a comprehensive MRSA colonization surveillance program. It is not intended to diagnose MRSA infection nor to guide or monitor treatment for MRSA infections.   CSF culture with Stat gram stain     Status: None   Collection Time: 09/02/14  5:20 PM  Result Value Ref Range Status   Specimen Description CSF  Final   Special Requests Immunocompromised  Final   Gram Stain   Final    CYTOSPIN SMEAR NO WBC SEEN NO ORGANISMS SEEN Gram Stain Report Called to,Read Back By and Verified With: PAM Margarito Courser [id number] @ 1924 BY J SCOTTON    Culture   Final    NO GROWTH 3 DAYS Performed at [facility]    Report Status 09/06/2014 FINAL  Final  AFB culture with smear     Status: None (Preliminary result)   Collection Time: 09/02/14  5:20 PM  Result Value Ref Range Status   Specimen Description CSF  Final   Special Requests Immunocompromised  Final   Acid Fast Smear   Final    NO ACID FAST BACILLI SEEN Performed at [facility]    Culture   Final    CULTURE WILL BE EXAMINED FOR 6 WEEKS BEFORE ISSUING A FINAL REPORT Performed at [facility]    Report Status PENDING  Incomplete  Fungus Culture with Smear     Status: None (Preliminary result)   Collection Time:  09/02/14  5:20 PM  Result Value Ref Range Status   Specimen Description CSF  Final   Special Requests Immunocompromised  Final   Fungal Smear   Final    NO YEAST OR FUNGAL ELEMENTS SEEN Performed at [facility]    Culture   Final    CULTURE IN PROGRESS FOR FOUR WEEKS Performed at [facility]    Report Status PENDING  Incomplete  Gram stain     Status: None   Collection Time: 09/02/14  5:20 PM  Result Value Ref Range Status   Specimen Description CSF  Final   Special Requests NONE  Final   Gram Stain   Final    NO ORGANISMS SEEN NO WBC SEEN CYTOSPUN SMEAR Gram Stain Report Called to,Read Back By and Verified With: PAM WEST,RN [id number] @ 1924 BY J SCOTTON    Report Status 09/02/2014 FINAL  Final  Clostridium Difficile by PCR (not at [facility])     Status: None   Collection Time: 09/05/14  2:53 AM  Result Value Ref Range Status   C difficile by pcr NEGATIVE NEGATIVE Final  Cath Tip Culture     Status: None   Collection Time: 09/06/14  8:37 AM  Result Value Ref Range Status   Specimen Description CATH TIP  Final   Special Requests NONE  Final   Culture   Final    NO GROWTH 2 DAYS Performed at [facility]  Report Status 09/08/2014 FINAL  Final  Culture, blood (routine x 2)     Status: None (Preliminary result)   Collection Time: 09/07/14  3:50 AM  Result Value Ref Range Status   Specimen Description BLOOD RIGHT FOREARM  Final   Special Requests BOTTLES DRAWN AEROBIC ONLY  Final   Culture   Final    NO GROWTH 2 DAYS Performed at [facility]    Report Status PENDING  Incomplete  Culture, blood (routine x 2)     Status: None (Preliminary result)   Collection Time: 09/07/14  3:58 AM  Result Value Ref Range Status   Specimen Description BLOOD LEFT HAND  Final   Special Requests BOTTLES DRAWN AEROBIC ONLY  Final   Culture   Final    NO GROWTH 2 DAYS Performed at [facility]    Report Status PENDING  Incomplete     Studies/Results: No results found.   Assessment/Plan: Multiple Myeloma BMT 2009 Last CTX 08-29-14 Anenia Neutropenic Fever Salmonella Sepsis (09-02-14) Repeat BCx 6-26 ngtd Port removed 09-06-14 Protein Calorie malnutrition, severe ? ADR, rash A-fib Chest pain (for cath when stable)  Total days of antibiotics: 7 levaquin 6-23  nystatin  Has been up ambulating Transfused today No further fever, repeat BCx ngtd.  Making some improvements, will continue to watch.  Aim for 10 days of levaquin         Bobby Rumpf Infectious Diseases (pager) [phone] [email or web address] 09/09/2014, 4:19 PM  LOS: 8 days

## 2014-09-09 NOTE — Progress Notes (Signed)
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  He started bendamustine and pomalidomide on 08/29/2014.  He has history of HTN.  Per patient, he has had fevers, chills, night sweats attributed to his MM for the last year.  He will rigor and have associated SOB which typically resolves on its own.  Separate from these symptoms, he has had intermittent severe exertional chest pain which radiates to both shoulders which is severe and associated with SOB, mild nausea, significant lightheadedness and diaphoresis.  Over the last few weeks, he has had difficulty walking up a flight of stairs before becoming SOB.  The last few days, he cannot walk across a room before he develops chest pain.  Pain is relieved by rest.  He had a CT scan which demonstrated no evidence of pneumonia or PE but did demonstrated three vessel calcifications.  Per oncology, his chemotherapy does not typically cause myocarditis or pericarditis or acute heart failure.  Cardiology has been consulted for possible unstable angina.  I have notified oncology at [facility] of patient's admission and Dr. Benay Spice has been added to the rounding list here at [facility].    Assessment/Plan   Sepsis with fever, tachycardia, tachypnea, AMS - AMS resolved and most likely secondary to infectious etiology. - sepsis resolving, ID on board and assisting  Gram negative bacteremia - Salmonella species  - Pt improving on Levaquin - Discussed case with ID who recommended port a cath removal. Subsequently consulted General surgery. Would like to thank ID and General Surgery for their assistance. Port a Cath removed 09/06/14   Atrial fibrillation - Cardiology  managing. 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  He started bendamustine and pomalidomide on 08/29/2014.  -  Agree with holding chemotherapy pending further investigation for infection -  Oncology will be available if any questions arise for now they have signed off   Pancytopenia due to MM and chemotherapy -  WBC  Within normal limits - platelets trending up - Worsening anemia in context of patient with MM and recent operation. Transfused given last hgb level. Reassess after transfusion    Hyponatremia may be due to poor oral solute intake -  Resolved.  Acute urinary retention, foley catheter placed  Diet:  NPO Access:  PIV IVF:  resume Proph: heparin  Code Status: full Family Communication: Patient  Disposition Plan: Patient has been switched over to oral medications for management of heart rate. Should heart rate remained controlled for the next 24 hours with plan on transitioning to floor   Consultants:  Cardiology  ID  Neurology  General surgery  Procedures:  CT angio chest  Antibiotics:  levaquin  Metronidazole  HPI/Subjective: Pt has no new complaints, no acute issues overnight.  Objective: Filed Vitals:   09/09/14 0845 09/09/14 0915 09/09/14 0925 09/09/14 1130  BP: 158/71 169/69  136/69  Pulse:      Temp: 99.2 F (37.3  C) 98 F (36.7 C)  98.8 F (37.1 C)  TempSrc: Oral Oral Oral Oral  Resp: 23 20  18   Height:      Weight:      SpO2: 96% 98% 98% 99%    Intake/Output Summary (Last 24 hours) at 09/09/14 1243 Last data filed at 09/09/14 1141  Gross per 24 hour  Intake   3085 ml   Output   1650 ml  Net   1435 ml   Filed Weights   09/01/14 1902 09/02/14 1300 09/03/14 0400  Weight: 99.791 kg (220 lb) 102.5 kg (225 lb 15.5 oz) 99 kg (218 lb 4.1 oz)    Exam: Initial:    General:  Awake and alert, in nad  HEENT:  No masses on visual examination, MMM  Cardiovascular:  No cyanosis, warm and pink extremities  Respiratory:  No increased wob, no wheezes, equal chest rise  Abdomen:   NABS, soft, NT/ND  MSK:   Normal tone and bulk, no LEE  Neuro:  Answers questions appropriately, no facial asymetry   Data Reviewed: Basic Metabolic Panel:  Recent Labs Lab 09/04/14 0558 09/05/14 0500 09/06/14 0520 09/07/14 0358 09/08/14 0351 09/09/14 0340  NA 133* 133* 139 141 140 139  K 3.6 3.7 3.6 3.9 3.3* 3.5  CL 105 107 113* 114* 111 111  CO2 19* 18* 18* 19* 20* 21*  GLUCOSE 121* 145* 121* 114* 144* 121*  BUN 17 16 21* 23* 21* 16  CREATININE 1.53* 1.55* 1.84* 1.88* 1.90* 1.74*  CALCIUM 7.5* 7.8* 8.4* 9.3 10.1 9.6  MG 2.4 2.0 2.1 2.2 2.1 1.9  PHOS 2.0*  --   --   --   --   --    Liver Function Tests:  Recent Labs Lab 09/05/14 0500 09/06/14 0520 09/07/14 0358 09/08/14 0351 09/09/14 0340  AST 27 19 20 18 28   ALT 36 32 29 27 27   ALKPHOS 91 100 101 107 134*  BILITOT 0.6 1.0 0.8 0.7 0.6  PROT 7.8 7.6 8.0 7.5 7.4  ALBUMIN 2.2* 2.1* 2.2* 2.2* 2.0*   No results for input(s): LIPASE, AMYLASE in the last 168 hours.  Recent Labs Lab 09/03/14 0510  AMMONIA 44*   CBC:  Recent Labs Lab 09/05/14 0500 09/06/14 0520 09/07/14 0358 09/08/14 0351 09/09/14 0340  WBC 4.2 4.8 4.3 3.7* 3.7*  NEUTROABS 3.3 3.2 2.5 2.0 1.9  HGB 8.2* 7.6* 7.4* 7.2* 6.7*  HCT 25.0* 23.5* 23.1* 22.1* 21.7*  MCV 87.4 86.7 87.8 88.8 88.9  PLT 95* 105* 100* 93* 99*   Cardiac Enzymes: No results for input(s): CKTOTAL, CKMB, CKMBINDEX, TROPONINI in the last 168 hours. BNP (last 3 results)  Recent Labs  09/01/14 1359  BNP 452.2*    ProBNP (last 3 results) No results for  input(s): PROBNP in the last 8760 hours.  CBG: No results for input(s): GLUCAP in the last 168 hours.  Recent Results (from the past 240 hour(s))  Culture, blood (routine x 2)     Status: None   Collection Time: 09/02/14  8:10 AM  Result Value Ref Range Status   Specimen Description BLOOD RIGHT ARM  Final   Special Requests BOTTLES DRAWN AEROBIC AND ANAEROBIC 10CC  Final   Culture   Final    NO GROWTH 5 DAYS Performed at [facility]    Report Status 09/07/2014 FINAL  Final  Culture, blood (routine x 2)     Status: None (Preliminary result)   Collection Time: 09/02/14  8:15 AM  Result Value Ref Range Status   Specimen Description BLOOD RIGHT HAND  Final   Special Requests BOTTLES DRAWN AEROBIC AND ANAEROBIC 5CC  Final   Culture  Setup Time   Final    GRAM NEGATIVE RODS ANAEROBIC BOTTLE ONLY CRITICAL RESULT CALLED TO, READ BACK BY AND VERIFIED WITH: Nehemiah Settle RN @ [phone] ON [id number] BY Rhea Bleacher    Culture   Final    SALMONELLA SPECIES CRITICAL RESULT CALLED TO, READ BACK BY AND VERIFIED WITH: R JOHNSON 09/05/14 @ 0951 M VESTAL Performed at [facility]    Report Status PENDING  Incomplete   Organism ID, Bacteria SALMONELLA SPECIES  Final      Susceptibility   Salmonella species - MIC*    AMPICILLIN <=2 SENSITIVE Sensitive     LEVOFLOXACIN <=0.12 SENSITIVE Sensitive     TRIMETH/SULFA <=20 SENSITIVE Sensitive     * SALMONELLA SPECIES  MRSA PCR Screening     Status: None   Collection Time: 09/02/14  3:35 PM  Result Value Ref Range Status   MRSA by PCR NEGATIVE NEGATIVE Final    Comment:        The GeneXpert MRSA Assay (FDA approved for NASAL specimens only), is one component of a comprehensive MRSA colonization surveillance program. It is not intended to diagnose MRSA infection nor to guide or monitor treatment for MRSA infections.   CSF culture with Stat gram stain     Status: None   Collection Time: 09/02/14  5:20 PM  Result Value Ref Range Status    Specimen Description CSF  Final   Special Requests Immunocompromised  Final   Gram Stain   Final    CYTOSPIN SMEAR NO WBC SEEN NO ORGANISMS SEEN Gram Stain Report Called to,Read Back By and Verified With: PAM Margarito Courser [id number] @ 1924 BY J SCOTTON    Culture   Final    NO GROWTH 3 DAYS Performed at [facility]    Report Status 09/06/2014 FINAL  Final  AFB culture with smear     Status: None (Preliminary result)   Collection Time: 09/02/14  5:20 PM  Result Value Ref Range Status   Specimen Description CSF  Final   Special Requests Immunocompromised  Final   Acid Fast Smear   Final    NO ACID FAST BACILLI SEEN Performed at [facility]    Culture   Final    CULTURE WILL BE EXAMINED FOR 6 WEEKS BEFORE ISSUING A FINAL REPORT Performed at [facility]    Report Status PENDING  Incomplete  Fungus Culture with Smear     Status: None (Preliminary result)   Collection Time: 09/02/14  5:20 PM  Result Value Ref Range Status   Specimen Description CSF  Final   Special Requests Immunocompromised  Final   Fungal Smear   Final    NO YEAST OR FUNGAL ELEMENTS SEEN Performed at [facility]    Culture   Final    CULTURE IN PROGRESS FOR FOUR WEEKS Performed at [facility]    Report Status PENDING  Incomplete  Gram stain     Status: None   Collection Time: 09/02/14  5:20 PM  Result Value Ref Range Status   Specimen Description CSF  Final   Special Requests NONE  Final   Gram Stain   Final    NO ORGANISMS SEEN NO WBC SEEN CYTOSPUN SMEAR Gram Stain Report Called to,Read Back By and Verified With: PAM WEST,RN [id number] @  1924 BY J SCOTTON    Report Status 09/02/2014 FINAL  Final  Clostridium Difficile by PCR (not at [facility])     Status: None   Collection Time: 09/05/14  2:53 AM  Result Value Ref Range Status   C difficile by pcr NEGATIVE NEGATIVE Final  Cath Tip Culture     Status: None   Collection Time: 09/06/14  8:37 AM  Result Value Ref Range  Status   Specimen Description CATH TIP  Final   Special Requests NONE  Final   Culture   Final    NO GROWTH 2 DAYS Performed at [facility]    Report Status 09/08/2014 FINAL  Final  Culture, blood (routine x 2)     Status: None (Preliminary result)   Collection Time: 09/07/14  3:50 AM  Result Value Ref Range Status   Specimen Description BLOOD RIGHT FOREARM  Final   Special Requests BOTTLES DRAWN AEROBIC ONLY  Final   Culture   Final    NO GROWTH 1 DAY Performed at [facility]    Report Status PENDING  Incomplete  Culture, blood (routine x 2)     Status: None (Preliminary result)   Collection Time: 09/07/14  3:58 AM  Result Value Ref Range Status   Specimen Description BLOOD LEFT HAND  Final   Special Requests BOTTLES DRAWN AEROBIC ONLY  Final   Culture   Final    NO GROWTH 1 DAY Performed at [facility]    Report Status PENDING  Incomplete     Studies: No results found.  Scheduled Meds: . allopurinol  300 mg Oral Daily  . amiodarone  400 mg Oral BID  . antiseptic oral rinse  7 mL Mouth Rinse BID  . aspirin  81 mg Oral Daily  . atorvastatin  80 mg Oral q1800  . carvedilol  12.5 mg Oral BID WC  . feeding supplement (ENSURE ENLIVE)  237 mL Oral BID BM  . feeding supplement (PRO-STAT 64)  30 mL Oral BID BM  . fentaNYL  50 mcg Transdermal Q72H  . heparin subcutaneous  5,000 Units Subcutaneous 3 times per day  . levofloxacin (LEVAQUIN) IV  750 mg Intravenous Q24H  . nystatin  5 mL Oral QID  . pantoprazole  80 mg Oral Q1200  . pregabalin  75 mg Oral BID  . sodium chloride  3 mL Intravenous Q12H   Continuous Infusions: . sodium chloride Stopped (09/07/14 1000)  . dextrose 5 % and 0.45 % NaCl with KCl 20 mEq/L 100 mL/hr at 09/09/14 1150  . norepinephrine (LEVOPHED) Adult infusion Stopped (09/04/14 1630)    Principal Problem:   Chest pain Active Problems:   Multiple myeloma   Essential hypertension   CKD (chronic kidney disease) stage 3,  GFR 30-59 ml/min   GERD (gastroesophageal reflux disease)   Abdominal pain, chronic, epigastric   Cancer   FUO (fever of unknown origin)   Lethargy   Tachycardia   Neutropenic fever   Screen for STD (sexually transmitted disease)   Aspergillus pneumonia   Dyspnea   Gram-negative bacteremia   Drug rash   Paroxysmal atrial fibrillation   Vomiting   Salmonella bacteremia   Protein-calorie malnutrition, severe   Thrush of mouth and esophagus    Time spent: 35 min    Velvet Bathe  [facility] Pager [phone] If 7PM-7AM, please contact night-coverage at [email or web address], password [email or web address] 09/09/2014, 12:43 PM  LOS: 8 days

## 2014-09-09 NOTE — Progress Notes (Signed)
CRITICAL VALUE ALERT  Critical value received:  HGB 6.7  Date of notification:  09/09/2014  Time of notification:  0500  Critical value read back yes  Nurse who received alert:  Sharyn Lull RN  MD notified (1st page): Schorr  Time of first page:  0530  MD notified (2nd page):none  Time of second page: none  Responding MD: Schorr  Time MD [id number]

## 2014-09-09 NOTE — Progress Notes (Signed)
Physical Therapy Treatment Patient Details Name: Walter Dennis MRN: [id number] DOB: 04-17-1950 Today's Date: 09/09/2014    History of Present Illness 64 yo male admitted with exertional chest pain, new LBBB, sepsis, dyspnea, + troponins. Hx of myeloma, HTN. Pt was Ind PTA    PT Comments    Progressing slowly with mobility. Pt still dependent on external support while ambulating. Will continue to assess mobility and possible need for assistive device. Pt reports bil LEs as feeling "heavy." Recommend ambulation at least 2x/day with nursing supervision. Feel pt could benefit from increased activity within tolerance.   Follow Up Recommendations  Home health PT     Equipment Recommendations  None recommended by PT    Recommendations for Other Services       Precautions / Restrictions Precautions Precautions: Fall Precaution Comments: monitor vitals Restrictions Weight Bearing Restrictions: No    Mobility  Bed Mobility               General bed mobility comments: pt OOB  Transfers     Transfers: Sit to/from Stand Sit to Stand: Min guard         General transfer comment: close guard for safety  Ambulation/Gait Ambulation/Gait assistance: Min guard;Min assist Ambulation Distance (Feet): 250 Feet Assistive device: 1 person hand held assist (IV pole and son's arm) Gait Pattern/deviations: Step-through pattern;Decreased stride length Gait velocity: slow   General Gait Details: assist to stabilize at times, especially with head turns and changes in direction. Pt reports bil LEs as feeling "heavy"   Stairs            Wheelchair Mobility    Modified Rankin (Stroke Patients Only)       Balance           Standing balance support: No upper extremity supported;During functional activity Standing balance-Leahy Scale: Fair                      Cognition Arousal/Alertness: Awake/alert Behavior During Therapy: WFL for tasks  assessed/performed Overall Cognitive Status: Within Functional Limits for tasks assessed                      Exercises      General Comments        Pertinent Vitals/Pain Pain Assessment: No/denies pain    Home Living                      Prior Function            PT Goals (current goals can now be found in the care plan section) Progress towards PT goals: Progressing toward goals (slowly)    Frequency  Min 3X/week    PT Plan Current plan remains appropriate    Co-evaluation             End of Session   Activity Tolerance: Patient tolerated treatment well Patient left: in bed;with call bell/phone within reach;with family/visitor present     Time: 8315-1761 PT Time Calculation (min) (ACUTE ONLY): 8 min  Charges:  $Gait Training: 8-22 mins                    G Codes:      Weston Anna, MPT Pager: [phone]

## 2014-09-09 NOTE — Progress Notes (Addendum)
Nutrition Follow-up  DOCUMENTATION CODES:  Obesity unspecified  INTERVENTION: - Continue Prostat BID, each supplement provides 100 kcal and 15 grams of protein - Will order Ensure Enlive BID, each supplement provides 350 kcal and 20 grams of protein - RD will continue to monitor for needs  NUTRITION DIAGNOSIS:  Increased nutrient needs related to cancer and cancer related treatments as evidenced by estimated needs. -ongoing  GOAL:  Patient will meet greater than or equal to 90% of their needs -unmet on average  MONITOR:  PO intake, Supplement acceptance, Weight trends, Labs  ASSESSMENT: Pt is a 64 year old male admitted for chest pain. He is currently undergoing chemo treatment for multiple myeloma.  No documented intakes on chart review. Pt reports poor appetite and lack of desire to eat the past few days. He requests a vanilla milkshake and RN in the room reports she can make him one with Ensure and ice cream on the floor. Will order Ensure Enlive BID to supplement. He denies abdominal pain or nausea this AM but that stomach has an "empty feeling" which he states is somewhat intentional.  Port-a-cath removed 09/06/14 and MD notes indicate holding of chemo for further investigation of infection.  Pt not meeting needs. Medications reviewed. Labs reviewed; creatinine elevated, GFR: 40.  Height:  Ht Readings from Last 1 Encounters:  09/02/14 6' (1.829 m)    Weight:  Wt Readings from Last 1 Encounters:  09/03/14 218 lb 4.1 oz (99 kg)    Ideal Body Weight:  80.1 kg (kg)  Wt Readings from Last 10 Encounters:  09/03/14 218 lb 4.1 oz (99 kg)  09/02/14 220 lb (99.791 kg)  08/12/14 220 lb 1.6 oz (99.837 kg)  06/23/14 221 lb 14.4 oz (100.653 kg)  06/12/14 221 lb 8 oz (100.472 kg)  05/15/14 220 lb 8 oz (100.018 kg)  05/15/14 220 lb 8 oz (100.018 kg)  04/17/14 221 lb 9.6 oz (100.517 kg)  03/20/14 225 lb (102.059 kg)  02/13/14 224 lb (101.606 kg)    BMI:  Body mass index  is 29.59 kg/(m^2).  Estimated Nutritional Needs:  Kcal:  0102-7253  Protein:  100-110 grams  Fluid:  2.2-2.5 L/day  Skin:  Reviewed, no issues  Diet Order:  Diet Heart Room service appropriate?: Yes; Fluid consistency:: Thin  EDUCATION NEEDS:  No education needs identified at this time   Intake/Output Summary (Last 24 hours) at 09/09/14 0925 Last data filed at 09/09/14 0600  Gross per 24 hour  Intake   2673 ml  Output   1650 ml  Net   1023 ml    Last BM:  6/28    Jarome Matin, RD, LDN Inpatient Clinical Dietitian Pager # [phone] After hours/weekend pager # [phone]

## 2014-09-10 ENCOUNTER — Telehealth: Payer: Self-pay | Admitting: Cardiovascular Disease

## 2014-09-10 ENCOUNTER — Inpatient Hospital Stay (HOSPITAL_COMMUNITY): Payer: BLUE CROSS/BLUE SHIELD

## 2014-09-10 DIAGNOSIS — C801 Malignant (primary) neoplasm, unspecified: Secondary | ICD-10-CM

## 2014-09-10 LAB — COMPREHENSIVE METABOLIC PANEL
ALBUMIN: 2.1 g/dL — AB (ref 3.5–5.0)
ALT: 29 U/L (ref 17–63)
AST: 24 U/L (ref 15–41)
Alkaline Phosphatase: 126 U/L (ref 38–126)
Anion gap: 7 (ref 5–15)
BUN: 13 mg/dL (ref 6–20)
CALCIUM: 10.1 mg/dL (ref 8.9–10.3)
CO2: 22 mmol/L (ref 22–32)
CREATININE: 1.53 mg/dL — AB (ref 0.61–1.24)
Chloride: 110 mmol/L (ref 101–111)
GFR calc Af Amer: 55 mL/min — ABNORMAL LOW (ref >60)
GFR, EST NON AFRICAN AMERICAN: 47 mL/min — AB (ref >60)
Glucose, Bld: 118 mg/dL — ABNORMAL HIGH (ref 65–99)
Potassium: 3.7 mmol/L (ref 3.5–5.1)
Sodium: 139 mmol/L (ref 135–145)
TOTAL PROTEIN: 7.8 g/dL (ref 6.5–8.1)
Total Bilirubin: 0.6 mg/dL (ref 0.3–1.2)

## 2014-09-10 LAB — PREPARE RBC (CROSSMATCH)

## 2014-09-10 LAB — CBC WITH DIFFERENTIAL/PLATELET
BASOS ABS: 0 10*3/uL (ref 0.0–0.1)
Basophils Relative: 1 % (ref 0–1)
EOS ABS: 0.3 10*3/uL (ref 0.0–0.7)
EOS PCT: 9 % — AB (ref 0–5)
HEMATOCRIT: 23.3 % — AB (ref 39.0–52.0)
HEMOGLOBIN: 7.7 g/dL — AB (ref 13.0–17.0)
Lymphocytes Relative: 25 % (ref 12–46)
Lymphs Abs: 0.8 10*3/uL (ref 0.7–4.0)
MCH: 29.1 pg (ref 26.0–34.0)
MCHC: 33 g/dL (ref 30.0–36.0)
MCV: 87.9 fL (ref 78.0–100.0)
MONO ABS: 0.5 10*3/uL (ref 0.1–1.0)
MONOS PCT: 14 % — AB (ref 3–12)
Neutro Abs: 1.7 10*3/uL (ref 1.7–7.7)
Neutrophils Relative %: 52 % (ref 43–77)
Platelets: 100 10*3/uL — ABNORMAL LOW (ref 150–400)
RBC: 2.65 MIL/uL — ABNORMAL LOW (ref 4.22–5.81)
RDW: 18.1 % — ABNORMAL HIGH (ref 11.5–15.5)
WBC: 3.3 10*3/uL — AB (ref 4.0–10.5)

## 2014-09-10 LAB — HEMOGLOBIN AND HEMATOCRIT, BLOOD
HEMATOCRIT: 27 % — AB (ref 39.0–52.0)
HEMOGLOBIN: 8.6 g/dL — AB (ref 13.0–17.0)

## 2014-09-10 LAB — BRAIN NATRIURETIC PEPTIDE: B Natriuretic Peptide: 629.7 pg/mL — ABNORMAL HIGH (ref 0.0–100.0)

## 2014-09-10 LAB — MAGNESIUM: MAGNESIUM: 1.7 mg/dL (ref 1.7–2.4)

## 2014-09-10 LAB — PROTIME-INR
INR: 1.17 (ref 0.00–1.49)
Prothrombin Time: 15.1 seconds (ref 11.6–15.2)

## 2014-09-10 MED ORDER — FUROSEMIDE 10 MG/ML IJ SOLN
40.0000 mg | Freq: Once | INTRAMUSCULAR | Status: AC
  Administered 2014-09-10: 40 mg via INTRAVENOUS
  Filled 2014-09-10: qty 4

## 2014-09-10 MED ORDER — SODIUM CHLORIDE 0.9 % IV SOLN
Freq: Once | INTRAVENOUS | Status: AC
  Administered 2014-09-10: 15:00:00 via INTRAVENOUS

## 2014-09-10 MED ORDER — AMLODIPINE BESYLATE 5 MG PO TABS
2.5000 mg | ORAL_TABLET | Freq: Every day | ORAL | Status: DC
  Administered 2014-09-10 – 2014-09-13 (×4): 2.5 mg via ORAL
  Filled 2014-09-10 (×4): qty 1

## 2014-09-10 NOTE — Telephone Encounter (Signed)
TCM  Per Suanne Marker TCM Scheduled with Nahser on 09/18/2014

## 2014-09-10 NOTE — Progress Notes (Addendum)
Patient Name: Walter Dennis Date of Encounter: 09/10/2014  Principal Problem:   Chest pain Active Problems:   Multiple myeloma   Essential hypertension   CKD (chronic kidney disease) stage 3, GFR 30-59 ml/min   GERD (gastroesophageal reflux disease)   Abdominal pain, chronic, epigastric   Cancer   FUO (fever of unknown origin)   Lethargy   Tachycardia   Neutropenic fever   Screen for STD (sexually transmitted disease)   Aspergillus pneumonia   Dyspnea   Gram-negative bacteremia   Drug rash   Paroxysmal atrial fibrillation   Vomiting   Salmonella bacteremia   Protein-calorie malnutrition, severe   Thrush of mouth and esophagus   Primary Cardiologist: Nahser  Patient Profile: 64 yo male w/ mult myeloma (chemo), HTN, Aspergillosis, and chest pain (neg MV 2014), was admitted 06/20 w/ chest pain. Sepsis w/ infected port-a-cath removed 06/25. Blood cx + Salmonella. Afib 06/26.  SUBJECTIVE: Feels much better, was on Lasix at home. Beginning to eat more. Nausea improved. Did not feel arrhythmia today.  OBJECTIVE Filed Vitals:   09/10/14 0402 09/10/14 0459 09/10/14 0745 09/10/14 0800  BP: 170/71  165/87   Pulse:      Temp:  98.3 F (36.8 C)  98.3 F (36.8 C)  TempSrc:  Oral  Oral  Resp: 18  17   Height:      Weight:      SpO2: 98%  100%     Intake/Output Summary (Last 24 hours) at 09/10/14 0929 Last data filed at 09/10/14 0805  Gross per 24 hour  Intake   3130 ml  Output   1875 ml  Net   1255 ml   Filed Weights   09/01/14 1902 09/02/14 1300 09/03/14 0400  Weight: 220 lb (99.791 kg) 225 lb 15.5 oz (102.5 kg) 218 lb 4.1 oz (99 kg)    PHYSICAL EXAM General: Well developed, well nourished, male in no acute distress. Head: Normocephalic, atraumatic.  Neck: Supple without bruits, JVD 9 cm. Lungs:  Resp regular and unlabored, rales bases. Heart: RRR, S1, S2, no S3, S4, 2/6 murmur; no rub. Abdomen: Soft, non-tender, non-distended, BS + x 4.  Extremities: No  clubbing, cyanosis, edema. Rash improving Neuro: Alert and oriented X 3. Moves all extremities spontaneously. Psych: Normal affect.  LABS: CBC: Recent Labs  09/09/14 0340 09/09/14 1750 09/10/14 0400  WBC 3.7* 3.4* 3.3*  NEUTROABS 1.9  --  1.7  HGB 6.7* 7.9* 7.7*  HCT 21.7* 24.5* 23.3*  MCV 88.9 88.8 87.9  PLT 99* 105* 100*   INR: Recent Labs  09/10/14 0400  INR 4.09   Basic Metabolic Panel: Recent Labs  09/09/14 0340 09/10/14 0400  NA 139 139  K 3.5 3.7  CL 111 110  CO2 21* 22  GLUCOSE 121* 118*  BUN 16 13  CREATININE 1.74* 1.53*  CALCIUM 9.6 10.1  MG 1.9 1.7   Liver Function Tests: Recent Labs  09/09/14 0340 09/10/14 0400  AST 28 24  ALT 27 29  ALKPHOS 134* 126  BILITOT 0.6 0.6  PROT 7.4 7.8  ALBUMIN 2.0* 2.1*   BNP:  B NATRIURETIC PEPTIDE  Date/Time Value Ref Range Status  09/01/2014 01:59 PM 452.2* 0.0 - 100.0 pg/mL Final    TELE:        ECG:   Radiology/Studies: Ct Angio Chest Pe W/cm &/or Wo Cm 09/02/2014   CLINICAL DATA:  64 year old male with sudden onset confusion, altered mental status, diaphoresis, lethargy. Multiple myeloma, chemotherapy underway. Initial  encounter.  EXAM: CT ANGIOGRAPHY CHEST WITH CONTRAST  TECHNIQUE: Multidetector CT imaging of the chest was performed using the standard protocol during bolus administration of intravenous contrast. Multiplanar CT image reconstructions and MIPs were obtained to evaluate the vascular anatomy.  CONTRAST:  134m OMNIPAQUE IOHEXOL 350 MG/ML SOLN  COMPARISON:  Noncontrast chest CT from yesterday, chest CTA 08/05/2014, and earlier  FINDINGS: Suboptimal contrast bolus timing in the pulmonary arterial tree. Increased respiratory motion artifact compared to the 08/05/2014 exam. The central pulmonary arteries remain patent. No hilar pulmonary artery filling defect identified. No lobar segmental filling defect identified. The more distal branches are not well evaluated today.  Trace bilateral pleural  effusions are new since yesterday. Dependent and right middle lobe atelectasis. Major airways remain patent. No consolidation.  Cardiomegaly. No pericardial effusion. Stable mediastinal nodes. Aortic and Coronary artery calcified atherosclerosis. No thoracic aortic dissection or aneurysm. Right chest porta cath. No axillary lymphadenopathy.  Hepatic steatosis, visible spleen, and bowel in the upper abdomen are stable.  Abnormal bone mineralization throughout the visible skeleton is stable since May. No acute fracture or dislocation identified in the thorax.  Review of the MIP images confirms the above findings.  IMPRESSION: 1. Suboptimal exam with regard to distal pulmonary arteries, no acute pulmonary embolus identified. 2. Trace pleural effusions are new since yesterday. Pulmonary atelectasis. 3. No other acute findings in the chest. Diffuse myelomatous skeletal involvement.   Electronically Signed   By: HGenevie AnnM.D.   On: 09/02/2014 15:09    Dg Chest Port 1 View 09/02/2014   CLINICAL DATA:  Shortness of Breath  EXAM: PORTABLE CHEST - 1 VIEW  COMPARISON:  Chest radiograph and chest CT September 01, 2014  FINDINGS: There is generalized interstitial edema. There is no airspace consolidation. Heart is borderline enlarged with pulmonary venous hypertension. Port-A-Cath tip is in superior vena cava. No pneumothorax. No adenopathy.  IMPRESSION: Evidence of congestive heart failure.   Electronically Signed   By: WLowella GripIII M.D.   On: 09/02/2014 12:25      Current Medications:  . allopurinol  300 mg Oral Daily  . amiodarone  400 mg Oral BID  . antiseptic oral rinse  7 mL Mouth Rinse BID  . aspirin  81 mg Oral Daily  . atorvastatin  80 mg Oral q1800  . carvedilol  12.5 mg Oral BID WC  . feeding supplement (ENSURE ENLIVE)  237 mL Oral BID BM  . feeding supplement (PRO-STAT 64)  30 mL Oral BID BM  . fentaNYL  50 mcg Transdermal Q72H  . heparin subcutaneous  5,000 Units Subcutaneous 3 times per day  .  levofloxacin (LEVAQUIN) IV  750 mg Intravenous Q24H  . nystatin  5 mL Oral QID  . pantoprazole  80 mg Oral Q1200  . pregabalin  75 mg Oral BID  . sodium chloride  3 mL Intravenous Q12H   . sodium chloride Stopped (09/07/14 1000)  . dextrose 5 % and 0.45 % NaCl with KCl 20 mEq/L 100 mL/hr at 09/10/14 0333  . norepinephrine (LEVOPHED) Adult infusion Stopped (09/04/14 1630)    ASSESSMENT AND PLAN: 1. Chest pain/dyspnea - concerning for unstable angina - new LBBB, borderline flat trop, coronary calcification noted [place] - LHC is indicated. However, the patient is undergoing extensive w/u by ID and neurology given worsening pancytopenia, fever and recent confusion. Will need to delay cath until his other medical issues improve, monitor  renal function and hgb. - echo with Ef 45-50% mildly reduced, last nuc 07/2013 his EF 54% - continue ASA, coreg, lipitor. No ACEI or ARB with AKI - f/u with Dr Acie Fredrickson (arranged) and discuss workup with him.  - recheck echo in 3 months.  2. PAF with RVR  - CHA2DS2VASC score 2 - occurred in the setting of infection - has had several episodes of a-fib this am, asymptomatic  - coreg increased 06/27  -  loaded with IV amio 06/23>06/27, now on PO amiodarone 485m BID for 7 days (06/27>07/04) or until discharge, then 4034mQD for a month, then 20025maily afterward (hopefully can discontinue at some point)  - if not a candidate for systemic anticoagulation due to comorbidities and acute illness, consider increasing ASA to 325 mg daily.    3. AMS: lumbar puncture 6/21 (neg csf cx) - in the setting of multiple medical issues, including multiple myeloma, chemotherapy, worsening pancytopenia and fever  4. Multiple myeloma with pancytopenia - per IM  5. Rash without  pruiritis suspect due to beta lactams and zosyn - per IM  6. Septic shock related to Salmonella bacteremia: port-a-cath removed by Surgery in OR 09/06/2014 - shock resolved.  7. Acute renal insufficiency - nephrotoxic agents removed including vanc  8. Possible volume overload  - I/O + by 2.5 L this admit  - no weight in a week  - had IV Lasix last week for CHF on CXR  - will recheck CXR and BNP  - get daily weights  - will give IV Lasix 40 mg x 1  - decrease IVF to 50 cc/hr and hopefully can d/c as PO intake improves.  Otherwise, per IM Active Problems:   Multiple myeloma   Essential hypertension   CKD (chronic kidney disease) stage 3, GFR 30-59 ml/min   GERD (gastroesophageal reflux disease)   Abdominal pain, chronic, epigastric   Cancer   FUO (fever of unknown origin)   Lethargy   Tachycardia   Neutropenic fever   Screen for STD (sexually transmitted disease)   Aspergillus pneumonia   Dyspnea   Gram-negative bacteremia   Drug rash   Paroxysmal atrial fibrillation   Vomiting   Salmonella bacteremia   Protein-calorie malnutrition, severe   Thrush of mouth and esophagus   Signed, BarLenoard Aden29 AM 09/10/2014 Beeper [id number] and all data above reviewed.  Patient examined.  I agree with the findings as above.  No palpitations.  No chest pain The patient exam reveals COR:RRR  ,  Lungs: Clear  ,  Abd: Positive bowel sounds, no rebound no guarding , Ext no edema  .  All available labs, radiology testing, previous records reviewed. Agree with documented assessment and plan. Atrial fib:  Short runs of a wide complex that is likely atrial fib.  Non sustained.  No change in therapy.  Not an anticoagulation candidate.  CAD:  No in patient work up planned.  No active symptoms.  HTN.   BP is creeping up.  Avoiding ARB/ACE with CKD.  I will add a low dose of Norvasc.   JamJeneen Rinkschrein  12:22 PM  09/10/2014

## 2014-09-10 NOTE — Progress Notes (Signed)
ANTIBIOTIC CONSULT NOTE - FOLLOW UP  Pharmacy Consult for Levaquin Indication: Salmonella bacteremia  Allergies  Allergen Reactions  . Ceftazidime Rash    Petechial rash   . Penicillins Hives  . Zosyn [Piperacillin Sod-Tazobactam So] Hives    Patient Measurements: Height: 6' (182.9 cm) Weight: 218 lb 4.1 oz (99 kg) IBW/kg (Calculated) : 77.6  Vital Signs: Temp: 98.3 F (36.8 C) (06/29 0800) Temp Source: Oral (06/29 0800) BP: 165/87 mmHg (06/29 0745) Intake/Output from previous day: 06/28 0701 - 06/29 0700 In: 3321.7 [P.O.:1240; I.V.:1596.7; Blood:335; IV Piggyback:150] Out: 4497 [Urine:1275]  Labs:  Recent Labs  09/08/14 0351 09/09/14 0340 09/09/14 1750 09/10/14 0400  WBC 3.7* 3.7* 3.4* 3.3*  HGB 7.2* 6.7* 7.9* 7.7*  PLT 93* 99* 105* 100*  CREATININE 1.90* 1.74*  --  1.53*   Estimated Creatinine Clearance: 61 mL/min (by C-G formula based on Cr of 1.53).    Assessment: 54 yoM admitted 6/20 with chills, SOB, and chest pain.  PMH includes multiple myeloma currently receiving chemotherapy, aspergillosis (2013), HTN, and recent pneumonia.  Admission labs show neutropenia; CT head was w/o acute abnormality.  Pharmacy was initially consulted to dose vancomycin, ceftazidime, and acylcovir for sepsis, febrile neutropenia, encephalitis.  Due to a rash, [place] Spotted Fever was a concern and changed to Levaquin.  He was then found to have Salmonella bacteremia and Levaquin was continued.  6/21 >> Vancomycin >> 6/22 6/21 >> Zosyn >> 6/21, restart 6/22 >> 6/22 6/21 >> Ceftazidime >> 6/21, restart 6/22 >> 6/23 6/21 >> Acyclovir >> 6/22 6/22 >> flagyl >> 6/24 6/23 >> levofloxacin >> 6/24 >> Doxy >> 6/24  Micro:  6/21 Blood: 1/2 1/2 Salmonella species (sens: ampicillin, LVQ, T/S)  6/25 PAC cath tip culture: ngtd 6/26 Blood cultures (repeat): ngtd  Today, 09/10/2014:  Tm AF  WBC: remains WNL  Renal: SCr improved to 1.53 , CrCl ~ 61 ml/min.   Goal of Therapy:   Appropriate abx dosing, eradication of infection.   Plan:   Continue Levaquin 750 mg IV q24 hr.   Closely monitor renal function and adjust Levaquin dose for CrCl < 50 ml/min.  Per ID continue levaquin for a total of 10 days  Dolly Rias RPh 09/10/2014, 8:19 AM Pager [phone]

## 2014-09-10 NOTE — Progress Notes (Signed)
Pt has had two periods of LBBB this am, but converts back to Sinus Rhythm or Sinus Brady HR between 80s-50s. Cardiology PA on unit and informed.

## 2014-09-10 NOTE — Clinical Documentation Improvement (Signed)
Medicare rules require specification as to whether an inpatient diagnosis was present at the time of admission.  Chart documentation notes salmonella sepsis and states in one place "salmonella sepsis (09/02/14)" and in other documents notes "... Admitted with exertional chest pain, new LBBB, sepsis, dyspnea..."   Please clarify if the following diagnosis salmonella sepsis was:     Marland Kitchen Present at the time of admission . NOT present at the time of inpatient admission and it developed during the inpatient stay . Unable to clinically determine whether the condition was present on admission. . Documentation insufficient to determine if condition was present at the time of inpatient admission  Thank you, Mateo Flow, RN [phone] Clinical Documentation Specialist

## 2014-09-10 NOTE — Progress Notes (Signed)
INFECTIOUS DISEASE PROGRESS NOTE  ID: Walter Dennis is a 64 y.o. male with  Principal Problem:   Chest pain Active Problems:   Multiple myeloma   Essential hypertension   CKD (chronic kidney disease) stage 3, GFR 30-59 ml/min   GERD (gastroesophageal reflux disease)   Abdominal pain, chronic, epigastric   Cancer   FUO (fever of unknown origin)   Lethargy   Tachycardia   Neutropenic fever   Screen for STD (sexually transmitted disease)   Aspergillus pneumonia   Dyspnea   Gram-negative bacteremia   Drug rash   Paroxysmal atrial fibrillation   Vomiting   Salmonella bacteremia   Protein-calorie malnutrition, severe   Thrush of mouth and esophagus  Subjective: No dysphagia Still some dryness of mouth, dysgeusia Rash on LE better  Abtx:  Anti-infectives    Start     Dose/Rate Route Frequency Ordered Stop   09/08/14 1530  fluconazole (DIFLUCAN) 40 MG/ML suspension 100 mg  Status:  Discontinued     100 mg Oral Daily 09/08/14 1519 09/08/14 1559   09/05/14 1000  doxycycline (VIBRAMYCIN) 100 mg in dextrose 5 % 250 mL IVPB  Status:  Discontinued     100 mg 125 mL/hr over 120 Minutes Intravenous Every 12 hours 09/05/14 0848 09/05/14 1004   09/04/14 1200  levofloxacin (LEVAQUIN) IVPB 750 mg     750 mg 100 mL/hr over 90 Minutes Intravenous Every 24 hours 09/04/14 1128     09/03/14 2330  cefTAZidime (FORTAZ) 2 g in dextrose 5 % 50 mL IVPB  Status:  Discontinued     2 g 100 mL/hr over 30 Minutes Intravenous 3 times per day 09/03/14 2324 09/04/14 1121   09/03/14 2330  metroNIDAZOLE (FLAGYL) IVPB 500 mg  Status:  Discontinued     500 mg 100 mL/hr over 60 Minutes Intravenous Every 8 hours 09/03/14 2324 09/05/14 1428   09/03/14 2200  piperacillin-tazobactam (ZOSYN) IVPB 3.375 g  Status:  Discontinued     3.375 g 12.5 mL/hr over 240 Minutes Intravenous 3 times per day 09/03/14 1801 09/03/14 2303   09/03/14 1500  metroNIDAZOLE (FLAGYL) tablet 500 mg  Status:  Discontinued     500 mg  Oral 3 times per day 09/03/14 1423 09/03/14 1754   09/02/14 2000  vancomycin (VANCOCIN) 1,250 mg in sodium chloride 0.9 % 250 mL IVPB  Status:  Discontinued     1,250 mg 166.7 mL/hr over 90 Minutes Intravenous Every 12 hours 09/02/14 0850 09/03/14 1754   09/02/14 1930  acyclovir (ZOVIRAX) 775 mg in dextrose 5 % 150 mL IVPB  Status:  Discontinued     775 mg 165.5 mL/hr over 60 Minutes Intravenous 3 times per day 09/02/14 1901 09/03/14 1754   09/02/14 1400  piperacillin-tazobactam (ZOSYN) IVPB 3.375 g  Status:  Discontinued     3.375 g 12.5 mL/hr over 240 Minutes Intravenous 3 times per day 09/02/14 0749 09/02/14 1209   09/02/14 1400  cefTAZidime (FORTAZ) 2 g in dextrose 5 % 50 mL IVPB  Status:  Discontinued     2 g 100 mL/hr over 30 Minutes Intravenous 3 times per day 09/02/14 1210 09/03/14 1754   09/02/14 0800  vancomycin (VANCOCIN) 1,250 mg in sodium chloride 0.9 % 250 mL IVPB     1,250 mg 166.7 mL/hr over 90 Minutes Intravenous STAT 09/02/14 0752 09/02/14 0930   09/02/14 0745  piperacillin-tazobactam (ZOSYN) IVPB 3.375 g     3.375 g 100 mL/hr over 30 Minutes Intravenous  Once 09/02/14 0737  09/02/14 0815   09/02/14 0745  vancomycin (VANCOCIN) IVPB 1000 mg/200 mL premix  Status:  Discontinued     1,000 mg 200 mL/hr over 60 Minutes Intravenous  Once 09/02/14 0737 09/02/14 0746      Medications:  Scheduled: . sodium chloride   Intravenous Once  . allopurinol  300 mg Oral Daily  . amiodarone  400 mg Oral BID  . amLODipine  2.5 mg Oral Daily  . antiseptic oral rinse  7 mL Mouth Rinse BID  . aspirin  81 mg Oral Daily  . atorvastatin  80 mg Oral q1800  . carvedilol  12.5 mg Oral BID WC  . feeding supplement (ENSURE ENLIVE)  237 mL Oral BID BM  . feeding supplement (PRO-STAT 64)  30 mL Oral BID BM  . fentaNYL  50 mcg Transdermal Q72H  . heparin subcutaneous  5,000 Units Subcutaneous 3 times per day  . levofloxacin (LEVAQUIN) IV  750 mg Intravenous Q24H  . nystatin  5 mL Oral QID  .  pantoprazole  80 mg Oral Q1200  . pregabalin  75 mg Oral BID  . sodium chloride  3 mL Intravenous Q12H    Objective: Vital signs in last 24 hours: Temp:  [98.2 F (36.8 C)-99.4 F (37.4 C)] 98.2 F (36.8 C) (06/29 1200) Resp:  [17-23] 17 (06/29 0745) BP: (142-170)/(59-87) 165/87 mmHg (06/29 0745) SpO2:  [98 %-100 %] 100 % (06/29 0745) Weight:  [97.523 kg (215 lb)] 97.523 kg (215 lb) (06/29 1135)   General appearance: alert, cooperative and no distress Throat: abnormal findings: thrush Resp: clear to auscultation bilaterally Chest wall: wounds clean, healing well Cardio: regular rate and rhythm GI: normal findings: soft, non-tender and abnormal findings:  distended and hyperactive bowel sounds Extremities: LE rash slightly better  Lab Results  Recent Labs  09/09/14 0340 09/09/14 1750 09/10/14 0400  WBC 3.7* 3.4* 3.3*  HGB 6.7* 7.9* 7.7*  HCT 21.7* 24.5* 23.3*  NA 139  --  139  K 3.5  --  3.7  CL 111  --  110  CO2 21*  --  22  BUN 16  --  13  CREATININE 1.74*  --  1.53*   Liver Panel  Recent Labs  09/09/14 0340 09/10/14 0400  PROT 7.4 7.8  ALBUMIN 2.0* 2.1*  AST 28 24  ALT 27 29  ALKPHOS 134* 126  BILITOT 0.6 0.6   Sedimentation Rate No results for input(s): ESRSEDRATE in the last 72 hours. C-Reactive Protein No results for input(s): CRP in the last 72 hours.  Microbiology: Recent Results (from the past 240 hour(s))  Culture, blood (routine x 2)     Status: None   Collection Time: 09/02/14  8:10 AM  Result Value Ref Range Status   Specimen Description BLOOD RIGHT ARM  Final   Special Requests BOTTLES DRAWN AEROBIC AND ANAEROBIC 10CC  Final   Culture   Final    NO GROWTH 5 DAYS Performed at [facility]    Report Status 09/07/2014 FINAL  Final  Culture, blood (routine x 2)     Status: None (Preliminary result)   Collection Time: 09/02/14  8:15 AM  Result Value Ref Range Status   Specimen Description BLOOD RIGHT HAND  Final   Special  Requests BOTTLES DRAWN AEROBIC AND ANAEROBIC 5CC  Final   Culture  Setup Time   Final    GRAM NEGATIVE RODS ANAEROBIC BOTTLE ONLY CRITICAL RESULT CALLED TO, READ BACK BY AND VERIFIED WITH: M. SHISLETT  RN @ [phone] ON [id number] BY Rhea Bleacher    Culture   Final    SALMONELLA SPECIES CRITICAL RESULT CALLED TO, READ BACK BY AND VERIFIED WITH: R JOHNSON 09/05/14 @ 0951 M VESTAL Performed at [facility]    Report Status PENDING  Incomplete   Organism ID, Bacteria SALMONELLA SPECIES  Final      Susceptibility   Salmonella species - MIC*    AMPICILLIN <=2 SENSITIVE Sensitive     LEVOFLOXACIN <=0.12 SENSITIVE Sensitive     TRIMETH/SULFA <=20 SENSITIVE Sensitive     * SALMONELLA SPECIES  MRSA PCR Screening     Status: None   Collection Time: 09/02/14  3:35 PM  Result Value Ref Range Status   MRSA by PCR NEGATIVE NEGATIVE Final    Comment:        The GeneXpert MRSA Assay (FDA approved for NASAL specimens only), is one component of a comprehensive MRSA colonization surveillance program. It is not intended to diagnose MRSA infection nor to guide or monitor treatment for MRSA infections.   CSF culture with Stat gram stain     Status: None   Collection Time: 09/02/14  5:20 PM  Result Value Ref Range Status   Specimen Description CSF  Final   Special Requests Immunocompromised  Final   Gram Stain   Final    CYTOSPIN SMEAR NO WBC SEEN NO ORGANISMS SEEN Gram Stain Report Called to,Read Back By and Verified With: PAM Margarito Courser [id number] @ 1924 BY J SCOTTON    Culture   Final    NO GROWTH 3 DAYS Performed at [facility]    Report Status 09/06/2014 FINAL  Final  AFB culture with smear     Status: None (Preliminary result)   Collection Time: 09/02/14  5:20 PM  Result Value Ref Range Status   Specimen Description CSF  Final   Special Requests Immunocompromised  Final   Acid Fast Smear   Final    NO ACID FAST BACILLI SEEN Performed at [facility]    Culture   Final      CULTURE WILL BE EXAMINED FOR 6 WEEKS BEFORE ISSUING A FINAL REPORT Performed at [facility]    Report Status PENDING  Incomplete  Fungus Culture with Smear     Status: None (Preliminary result)   Collection Time: 09/02/14  5:20 PM  Result Value Ref Range Status   Specimen Description CSF  Final   Special Requests Immunocompromised  Final   Fungal Smear   Final    NO YEAST OR FUNGAL ELEMENTS SEEN Performed at [facility]    Culture   Final    CULTURE IN PROGRESS FOR FOUR WEEKS Performed at [facility]    Report Status PENDING  Incomplete  Gram stain     Status: None   Collection Time: 09/02/14  5:20 PM  Result Value Ref Range Status   Specimen Description CSF  Final   Special Requests NONE  Final   Gram Stain   Final    NO ORGANISMS SEEN NO WBC SEEN CYTOSPUN SMEAR Gram Stain Report Called to,Read Back By and Verified With: PAM WEST,RN [id number] @ 1924 BY J SCOTTON    Report Status 09/02/2014 FINAL  Final  Clostridium Difficile by PCR (not at [facility])     Status: None   Collection Time: 09/05/14  2:53 AM  Result Value Ref Range Status   C difficile by pcr NEGATIVE NEGATIVE Final  Cath Tip Culture  Status: None   Collection Time: 09/06/14  8:37 AM  Result Value Ref Range Status   Specimen Description CATH TIP  Final   Special Requests NONE  Final   Culture   Final    NO GROWTH 2 DAYS Performed at [facility]    Report Status 09/08/2014 FINAL  Final  Culture, blood (routine x 2)     Status: None (Preliminary result)   Collection Time: 09/07/14  3:50 AM  Result Value Ref Range Status   Specimen Description BLOOD RIGHT FOREARM  Final   Special Requests BOTTLES DRAWN AEROBIC ONLY  Final   Culture   Final    NO GROWTH 3 DAYS Performed at [facility]    Report Status PENDING  Incomplete  Culture, blood (routine x 2)     Status: None (Preliminary result)   Collection Time: 09/07/14  3:58 AM  Result Value Ref Range Status    Specimen Description BLOOD LEFT HAND  Final   Special Requests BOTTLES DRAWN AEROBIC ONLY  Final   Culture   Final    NO GROWTH 3 DAYS Performed at [facility]    Report Status PENDING  Incomplete    Studies/Results: Dg Chest 2 View  09/10/2014   CLINICAL DATA:  Sepsis with fever. Tachycardia. Altered mental status. Multiple myeloma.  EXAM: CHEST  2 VIEW  COMPARISON:  09/02/2014 CT and plain film.  FINDINGS: Removal of right-sided central line. Numerous leads and wires project over the chest. Midline trachea. Normal heart size. No pleural effusion or pneumothorax. Clear lungs. Pulmonary interstitial thickening  IMPRESSION: Resolved congestive heart failure or atypical infection. No acute findings.   Electronically Signed   By: Abigail Miyamoto M.D.   On: 09/10/2014 12:31     Assessment/Plan: Multiple Myeloma BMT 2009 Last CTX 08-29-14 Anenia Neutropenic Fever Salmonella Sepsis (09-02-14) present on admission Repeat BCx 6-26 ngtd Port removed 09-06-14 Protein Calorie malnutrition, severe ? ADR, rash A-fib Chest pain (for cath when stable)  Total days of antibiotics: 8 levaquin 6-23 nystatin 6-27  Aim to stop levaquin on 7-3, can change to PO if needed Stop nystatin on 7-3 possible transfusion today Available if questions         Bobby Rumpf Infectious Diseases (pager) [phone] [email or web address] 09/10/2014, 2:50 PM  LOS: 9 days

## 2014-09-10 NOTE — Telephone Encounter (Signed)
TCM pt--pt currently still in the hospital pending discharge.  Triage to follow-up with the pt for a TCM call once discharged from the hospital.

## 2014-09-10 NOTE — Progress Notes (Signed)
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  He started bendamustine and pomalidomide on 08/29/2014.  He has history of HTN.  Per patient, he has had fevers, chills, night sweats attributed to his MM for the last year.  He will rigor and have associated SOB which typically resolves on its own.  Separate from these symptoms, he has had intermittent severe exertional chest pain which radiates to both shoulders which is severe and associated with SOB, mild nausea, significant lightheadedness and diaphoresis.  Over the last few weeks, he has had difficulty walking up a flight of stairs before becoming SOB.  The last few days, he cannot walk across a room before he develops chest pain.  Pain is relieved by rest.  He had a CT scan which demonstrated no evidence of pneumonia or PE but did demonstrated three vessel calcifications.  Per oncology, his chemotherapy does not typically cause myocarditis or pericarditis or acute heart failure.  Cardiology has been consulted for possible unstable angina.  I have notified oncology at [facility] of patient's admission and Dr. Benay Spice has been added to the rounding list here at [facility].    Assessment/Plan   Sepsis with fever, tachycardia, tachypnea, AMS - AMS resolved and most likely secondary to infectious etiology. - sepsis resolving, ID on board and assisting. Continue current antibiotics regimen  Gram negative bacteremia - Salmonella species  - Pt improving on Levaquin - Discussed case with ID who recommended port a cath removal. Subsequently consulted General surgery. Would like to thank ID and General Surgery for their assistance. Port a Cath removed  09/06/14   Atrial fibrillation - Cardiology managing.  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  He started bendamustine and pomalidomide on 08/29/2014.  -  Agree with holding chemotherapy pending further investigation for infection -  Oncology will be available if any questions arise for now they have signed off   Pancytopenia due to MM and chemotherapy -  WBC  Within normal limits - platelets trending up - Worsening anemia in context of patient with MM and recent operation.  - Patient has hemoglobin less than 8.0 given cardiac history and current cardiac problems will maintain hemoglobin above 8.0. As such will plan on transfusing 1 unit and reassessing H&H after transfusion   Hyponatremia may be due to poor oral solute intake -  Resolved.  Acute urinary retention, foley catheter placed  Diet:  NPO Access:  PIV IVF:  resume Proph: heparin  Code Status: full Family Communication: Patient  Disposition Plan: Once heart rate stabilizes we'll plan on transferring to floor, this is been discussed with nursing   Consultants:  Cardiology  ID  Neurology  General surgery  Procedures:  CT angio chest  Antibiotics:  levaquin  HPI/Subjective: Patient feels better  Objective: Filed Vitals:   09/10/14 0402 09/10/14 0459 09/10/14 0745 09/10/14 0800  BP: 170/71  165/87  Pulse:      Temp:  98.3 F (36.8 C)  98.3 F (36.8 C)  TempSrc:  Oral  Oral  Resp: 18  17   Height:      Weight:      SpO2: 98%  100%     Intake/Output Summary (Last 24 hours) at 09/10/14 1046 Last data  filed at 09/10/14 1000  Gross per 24 hour  Intake   3200 ml  Output   1875 ml  Net   1325 ml   Filed Weights   09/01/14 1902 09/02/14 1300 09/03/14 0400  Weight: 99.791 kg (220 lb) 102.5 kg (225 lb 15.5 oz) 99 kg (218 lb 4.1 oz)    Exam: Initial:    General:  Awake and alert, in nad  HEENT:  No masses on visual examination, MMM  Cardiovascular:  No cyanosis, warm and pink extremities  Respiratory:  No increased wob, no wheezes, equal chest rise  Abdomen:   NABS, soft, NT/ND  MSK:   Normal tone and bulk, no LEE  Neuro:  Answers questions appropriately, no facial asymetry   Data Reviewed: Basic Metabolic Panel:  Recent Labs Lab 09/04/14 0558  09/06/14 0520 09/07/14 0358 09/08/14 0351 09/09/14 0340 09/10/14 0400  NA 133*  < > 139 141 140 139 139  K 3.6  < > 3.6 3.9 3.3* 3.5 3.7  CL 105  < > 113* 114* 111 111 110  CO2 19*  < > 18* 19* 20* 21* 22  GLUCOSE 121*  < > 121* 114* 144* 121* 118*  BUN 17  < > 21* 23* 21* 16 13  CREATININE 1.53*  < > 1.84* 1.88* 1.90* 1.74* 1.53*  CALCIUM 7.5*  < > 8.4* 9.3 10.1 9.6 10.1  MG 2.4  < > 2.1 2.2 2.1 1.9 1.7  PHOS 2.0*  --   --   --   --   --   --   < > = values in this interval not displayed. Liver Function Tests:  Recent Labs Lab 09/06/14 0520 09/07/14 0358 09/08/14 0351 09/09/14 0340 09/10/14 0400  AST _0 ALT 32 _1 ALKPHOS 100 101 107 134* 126  BILITOT 1.0 0.8 0.7 0.6 0.6  PROT 7.6 8.0 7.5 7.4 7.8  ALBUMIN 2.1* 2.2* 2.2* 2.0* 2.1*   No results for input(s): LIPASE, AMYLASE in the last 168 hours. No results for input(s): AMMONIA in the last 168 hours. CBC:  Recent Labs Lab 09/06/14 0520 09/07/14 0358 09/08/14 0351 09/09/14 0340 09/09/14 1750 09/10/14 0400  WBC 4.8 4.3 3.7* 3.7* 3.4* 3.3*  NEUTROABS 3.2 2.5 2.0 1.9  --  1.7  HGB 7.6* 7.4* 7.2* 6.7* 7.9* 7.7*  HCT 23.5* 23.1* 22.1* 21.7* 24.5* 23.3*  MCV 86.7 87.8 88.8 88.9 88.8 87.9  PLT 105* 100* 93* 99* 105* 100*   Cardiac  Enzymes: No results for input(s): CKTOTAL, CKMB, CKMBINDEX, TROPONINI in the last 168 hours. BNP (last 3 results)  Recent Labs  09/01/14 1359  BNP 452.2*    ProBNP (last 3 results) No results for input(s): PROBNP in the last 8760 hours.  CBG: No results for input(s): GLUCAP in the last 168 hours.  Recent Results (from the past 240 hour(s))  Culture, blood (routine x 2)     Status: None   Collection Time: 09/02/14  8:10 AM  Result Value Ref Range Status   Specimen Description BLOOD RIGHT ARM  Final   Special Requests BOTTLES DRAWN  AEROBIC AND ANAEROBIC 10CC  Final   Culture   Final    NO GROWTH 5 DAYS Performed at [facility]    Report Status 09/07/2014 FINAL  Final  Culture, blood (routine x 2)     Status: None (Preliminary result)   Collection Time: 09/02/14  8:15 AM  Result Value Ref Range Status   Specimen Description BLOOD RIGHT HAND  Final   Special Requests BOTTLES DRAWN AEROBIC AND ANAEROBIC 5CC  Final   Culture  Setup Time   Final    GRAM NEGATIVE RODS ANAEROBIC BOTTLE ONLY CRITICAL RESULT CALLED TO, READ BACK BY AND VERIFIED WITH: Nehemiah Settle RN @ [phone] ON [id number] BY Rhea Bleacher    Culture   Final    SALMONELLA SPECIES CRITICAL RESULT CALLED TO, READ BACK BY AND VERIFIED WITH: R JOHNSON 09/05/14 @ 0951 M VESTAL Performed at [facility]    Report Status PENDING  Incomplete   Organism ID, Bacteria SALMONELLA SPECIES  Final      Susceptibility   Salmonella species - MIC*    AMPICILLIN <=2 SENSITIVE Sensitive     LEVOFLOXACIN <=0.12 SENSITIVE Sensitive     TRIMETH/SULFA <=20 SENSITIVE Sensitive     * SALMONELLA SPECIES  MRSA PCR Screening     Status: None   Collection Time: 09/02/14  3:35 PM  Result Value Ref Range Status   MRSA by PCR NEGATIVE NEGATIVE Final    Comment:        The GeneXpert MRSA Assay (FDA approved for NASAL specimens only), is one component of a comprehensive MRSA colonization surveillance program. It is not intended to  diagnose MRSA infection nor to guide or monitor treatment for MRSA infections.   CSF culture with Stat gram stain     Status: None   Collection Time: 09/02/14  5:20 PM  Result Value Ref Range Status   Specimen Description CSF  Final   Special Requests Immunocompromised  Final   Gram Stain   Final    CYTOSPIN SMEAR NO WBC SEEN NO ORGANISMS SEEN Gram Stain Report Called to,Read Back By and Verified With: PAM Margarito Courser [id number] @ 1924 BY J SCOTTON    Culture   Final    NO GROWTH 3 DAYS Performed at [facility]    Report Status 09/06/2014 FINAL  Final  AFB culture with smear     Status: None (Preliminary result)   Collection Time: 09/02/14  5:20 PM  Result Value Ref Range Status   Specimen Description CSF  Final   Special Requests Immunocompromised  Final   Acid Fast Smear   Final    NO ACID FAST BACILLI SEEN Performed at [facility]    Culture   Final    CULTURE WILL BE EXAMINED FOR 6 WEEKS BEFORE ISSUING A FINAL REPORT Performed at [facility]    Report Status PENDING  Incomplete  Fungus Culture with Smear     Status: None (Preliminary result)   Collection Time: 09/02/14  5:20 PM  Result Value Ref Range Status   Specimen Description CSF  Final   Special Requests Immunocompromised  Final   Fungal Smear   Final    NO YEAST OR FUNGAL ELEMENTS SEEN Performed at [facility]    Culture   Final    CULTURE IN PROGRESS FOR FOUR WEEKS Performed at [facility]    Report Status PENDING  Incomplete  Gram stain     Status: None   Collection  Time: 09/02/14  5:20 PM  Result Value Ref Range Status   Specimen Description CSF  Final   Special Requests NONE  Final   Gram Stain   Final    NO ORGANISMS SEEN NO WBC SEEN CYTOSPUN SMEAR Gram Stain Report Called to,Read Back By and Verified With: PAM WEST,RN [id number] @ 1924 BY J SCOTTON    Report Status 09/02/2014 FINAL  Final  Clostridium Difficile by PCR (not at [facility])     Status: None    Collection Time: 09/05/14  2:53 AM  Result Value Ref Range Status   C difficile by pcr NEGATIVE NEGATIVE Final  Cath Tip Culture     Status: None   Collection Time: 09/06/14  8:37 AM  Result Value Ref Range Status   Specimen Description CATH TIP  Final   Special Requests NONE  Final   Culture   Final    NO GROWTH 2 DAYS Performed at [facility]    Report Status 09/08/2014 FINAL  Final  Culture, blood (routine x 2)     Status: None (Preliminary result)   Collection Time: 09/07/14  3:50 AM  Result Value Ref Range Status   Specimen Description BLOOD RIGHT FOREARM  Final   Special Requests BOTTLES DRAWN AEROBIC ONLY  Final   Culture   Final    NO GROWTH 2 DAYS Performed at [facility]    Report Status PENDING  Incomplete  Culture, blood (routine x 2)     Status: None (Preliminary result)   Collection Time: 09/07/14  3:58 AM  Result Value Ref Range Status   Specimen Description BLOOD LEFT HAND  Final   Special Requests BOTTLES DRAWN AEROBIC ONLY  Final   Culture   Final    NO GROWTH 2 DAYS Performed at [facility]    Report Status PENDING  Incomplete     Studies: No results found.  Scheduled Meds: . sodium chloride   Intravenous Once  . allopurinol  300 mg Oral Daily  . amiodarone  400 mg Oral BID  . antiseptic oral rinse  7 mL Mouth Rinse BID  . aspirin  81 mg Oral Daily  . atorvastatin  80 mg Oral q1800  . carvedilol  12.5 mg Oral BID WC  . feeding supplement (ENSURE ENLIVE)  237 mL Oral BID BM  . feeding supplement (PRO-STAT 64)  30 mL Oral BID BM  . fentaNYL  50 mcg Transdermal Q72H  . heparin subcutaneous  5,000 Units Subcutaneous 3 times per day  . levofloxacin (LEVAQUIN) IV  750 mg Intravenous Q24H  . nystatin  5 mL Oral QID  . pantoprazole  80 mg Oral Q1200  . pregabalin  75 mg Oral BID  . sodium chloride  3 mL Intravenous Q12H   Continuous Infusions: . dextrose 5 % and 0.45 % NaCl with KCl 20 mEq/L 50 mL/hr at 09/10/14 1000  .  norepinephrine (LEVOPHED) Adult infusion Stopped (09/04/14 1630)    Principal Problem:   Chest pain Active Problems:   Multiple myeloma   Essential hypertension   CKD (chronic kidney disease) stage 3, GFR 30-59 ml/min   GERD (gastroesophageal reflux disease)   Abdominal pain, chronic, epigastric   Cancer   FUO (fever of unknown origin)   Lethargy   Tachycardia   Neutropenic fever   Screen for STD (sexually transmitted disease)   Aspergillus pneumonia   Dyspnea   Gram-negative bacteremia   Drug rash   Paroxysmal atrial fibrillation  Vomiting   Salmonella bacteremia   Protein-calorie malnutrition, severe   Thrush of mouth and esophagus    Time spent: 35 min    Velvet Bathe  [facility] Pager [phone] If 7PM-7AM, please contact night-coverage at [email or web address], password [facility] 09/10/2014, 10:46 AM  LOS: 9 days

## 2014-09-11 LAB — CBC WITH DIFFERENTIAL/PLATELET
Basophils Absolute: 0.1 10*3/uL (ref 0.0–0.1)
Basophils Relative: 2 % — ABNORMAL HIGH (ref 0–1)
Eosinophils Absolute: 0.3 10*3/uL (ref 0.0–0.7)
Eosinophils Relative: 9 % — ABNORMAL HIGH (ref 0–5)
HCT: 25.1 % — ABNORMAL LOW (ref 39.0–52.0)
Hemoglobin: 8.3 g/dL — ABNORMAL LOW (ref 13.0–17.0)
Lymphocytes Relative: 24 % (ref 12–46)
Lymphs Abs: 0.9 10*3/uL (ref 0.7–4.0)
MCH: 28.3 pg (ref 26.0–34.0)
MCHC: 33.1 g/dL (ref 30.0–36.0)
MCV: 85.7 fL (ref 78.0–100.0)
MONO ABS: 0.5 10*3/uL (ref 0.1–1.0)
Monocytes Relative: 14 % — ABNORMAL HIGH (ref 3–12)
NEUTROS PCT: 51 % (ref 43–77)
Neutro Abs: 1.8 10*3/uL (ref 1.7–7.7)
PLATELETS: 104 10*3/uL — AB (ref 150–400)
RBC: 2.93 MIL/uL — ABNORMAL LOW (ref 4.22–5.81)
RDW: 17.2 % — ABNORMAL HIGH (ref 11.5–15.5)
WBC: 3.6 10*3/uL — AB (ref 4.0–10.5)
nRBC: 7 /100 WBC — ABNORMAL HIGH

## 2014-09-11 LAB — COMPREHENSIVE METABOLIC PANEL
ALBUMIN: 2.4 g/dL — AB (ref 3.5–5.0)
ALT: 25 U/L (ref 17–63)
AST: 19 U/L (ref 15–41)
Alkaline Phosphatase: 125 U/L (ref 38–126)
Anion gap: 8 (ref 5–15)
BUN: 14 mg/dL (ref 6–20)
CALCIUM: 10 mg/dL (ref 8.9–10.3)
CO2: 26 mmol/L (ref 22–32)
Chloride: 105 mmol/L (ref 101–111)
Creatinine, Ser: 1.53 mg/dL — ABNORMAL HIGH (ref 0.61–1.24)
GFR, EST AFRICAN AMERICAN: 55 mL/min — AB (ref >60)
GFR, EST NON AFRICAN AMERICAN: 47 mL/min — AB (ref >60)
Glucose, Bld: 107 mg/dL — ABNORMAL HIGH (ref 65–99)
POTASSIUM: 3.3 mmol/L — AB (ref 3.5–5.1)
SODIUM: 139 mmol/L (ref 135–145)
Total Bilirubin: 0.8 mg/dL (ref 0.3–1.2)
Total Protein: 7.8 g/dL (ref 6.5–8.1)

## 2014-09-11 LAB — MAGNESIUM: Magnesium: 1.8 mg/dL (ref 1.7–2.4)

## 2014-09-11 LAB — TYPE AND SCREEN
ABO/RH(D): A POS
Antibody Screen: NEGATIVE
Unit division: 0
Unit division: 0

## 2014-09-11 LAB — PROTIME-INR
INR: 1.18 (ref 0.00–1.49)
Prothrombin Time: 15.2 seconds (ref 11.6–15.2)

## 2014-09-11 MED ORDER — POTASSIUM CHLORIDE CRYS ER 20 MEQ PO TBCR
20.0000 meq | EXTENDED_RELEASE_TABLET | Freq: Once | ORAL | Status: AC
  Administered 2014-09-11: 20 meq via ORAL
  Filled 2014-09-11: qty 1

## 2014-09-11 NOTE — Progress Notes (Addendum)
[facility] PROGRESS NOTE  Walter Dennis CWC:[id number] DOB: December 04, 1950 DOA: 09/01/2014 PCP: Effie Berkshire A  Brief Summary from prior PN  The patient is a 64 yo M with history of multiple myeloma, IgG-lambda dx 2008 s/p autologous stem cell transplant 2009, most recently with disease progression on carfilzomib, lenalidomide, and dexamethasone.  He started bendamustine and pomalidomide on 08/29/2014.  He has history of HTN.  Per patient, he has had fevers, chills, night sweats attributed to his MM for the last year.  He will rigor and have associated SOB which typically resolves on its own.  Separate from these symptoms, he has had intermittent severe exertional chest pain which radiates to both shoulders which is severe and associated with SOB, mild nausea, significant lightheadedness and diaphoresis.  Over the last few weeks, he has had difficulty walking up a flight of stairs before becoming SOB.  The last few days, he cannot walk across a room before he develops chest pain.  Pain is relieved by rest.  He had a CT scan which demonstrated no evidence of pneumonia or PE but did demonstrated three vessel calcifications.  Per oncology, his chemotherapy does not typically cause myocarditis or pericarditis or acute heart failure.  Cardiology has been consulted for possible unstable angina.   Assessment/Plan   Sepsis with fever, tachycardia, tachypnea, AMS - AMS resolved and most likely secondary to infectious etiology. - sepsis resolving, ID on board please refer to their note on 09/10/14 for recommendations on end dates of antiinfective agents  Gram negative bacteremia - Salmonella species  - Pt improving on Levaquin - Discussed case with ID who recommended port a cath removal. Subsequently consulted General surgery. Would like to thank ID and General Surgery for their assistance. Port a Cath removed 09/06/14   Atrial fibrillation - Cardiology managing.  - Not anticoagulation  Chest pain  -  Resolved cardiology on board - I would maintain cbc above 8.0. - Plan is for outpatient work up  Acute hypoxic respiratory failure may be due to acute diastolic heart failure or ARDS from developing severe sepsis - Resolved   MM IgG-lambda dx 2008 s/p autologous stem cell transplant 2009, most recently with disease progression on carfilzomib, lenalidomide, and dexamethasone.  He started bendamustine and pomalidomide on 08/29/2014.  -  Agree with holding chemotherapy pending further investigation for infection -  Oncology will be available if any questions arise for now they have signed off   Pancytopenia due to MM and chemotherapy -  WBC  Within normal limits - platelets trending up - Worsening anemia in context of patient with MM and recent operation.  - Addendum: Patient had hgb of < 8.0 was transfused. Would maintain hgb levels above 8.0  Hyponatremia may be due to poor oral solute intake -  Resolved.  Acute urinary retention, foley catheter placed  Diet:  NPO Access:  PIV IVF:  resume Proph: heparin  Code Status: full Family Communication: Patient  Disposition Plan: Transfer to floor   Consultants:  Cardiology  ID  Neurology  General surgery  Procedures:  CT angio chest  Antibiotics:  levaquin  HPI/Subjective: Patient has no new complaints. Denies any chest pain currently  Objective: Filed Vitals:   09/11/14 0545 09/11/14 0745 09/11/14 1010 09/11/14 1011  BP: 154/87 152/62 154/65 154/65  Pulse:      Temp: 98.9 F (37.2 C)     TempSrc: Oral     Resp: _0 Height:      Weight:  SpO2: 91% 99% 90%     Intake/Output Summary (Last 24 hours) at 09/11/14 1217 Last data filed at 09/11/14 1100  Gross per 24 hour  Intake   2380 ml  Output   3150 ml  Net   -770 ml   Filed Weights   09/02/14 1300 09/03/14 0400 09/10/14 1135  Weight: 102.5 kg (225 lb 15.5 oz) 99 kg (218 lb 4.1 oz) 97.523 kg (215 lb)    Exam: Initial:    General:   Awake and alert, in nad  HEENT:  No masses on visual examination, MMM  Cardiovascular:  No cyanosis, warm and pink extremities  Respiratory:  No increased wob, no wheezes, equal chest rise  Abdomen:   NABS, soft, NT/ND  MSK:   Normal tone and bulk, no LEE  Neuro:  Answers questions appropriately, no facial asymetry   Data Reviewed: Basic Metabolic Panel:  Recent Labs Lab 09/07/14 0358 09/08/14 0351 09/09/14 0340 09/10/14 0400 09/11/14 0505  NA 141 140 139 139 139  K 3.9 3.3* 3.5 3.7 3.3*  CL 114* 111 111 110 105  CO2 19* 20* 21* 22 26  GLUCOSE 114* 144* 121* 118* 107*  BUN 23* 21* _0 CREATININE 1.88* 1.90* 1.74* 1.53* 1.53*  CALCIUM 9.3 10.1 9.6 10.1 10.0  MG 2.2 2.1 1.9 1.7 1.8   Liver Function Tests:  Recent Labs Lab 09/07/14 0358 09/08/14 0351 09/09/14 0340 09/10/14 0400 09/11/14 0505  AST _1 ALT _2 ALKPHOS 101 107 134* 126 125  BILITOT 0.8 0.7 0.6 0.6 0.8  PROT 8.0 7.5 7.4 7.8 7.8  ALBUMIN 2.2* 2.2* 2.0* 2.1* 2.4*   No results for input(s): LIPASE, AMYLASE in the last 168 hours. No results for input(s): AMMONIA in the last 168 hours. CBC:  Recent Labs Lab 09/07/14 0358 09/08/14 0351 09/09/14 0340 09/09/14 1750 09/10/14 0400 09/10/14 2045 09/11/14 0505  WBC 4.3 3.7* 3.7* 3.4* 3.3*  --  3.6*  NEUTROABS 2.5 2.0 1.9  --  1.7  --  1.8  HGB 7.4* 7.2* 6.7* 7.9* 7.7* 8.6* 8.3*  HCT 23.1* 22.1* 21.7* 24.5* 23.3* 27.0* 25.1*  MCV 87.8 88.8 88.9 88.8 87.9  --  85.7  PLT 100* 93* 99* 105* 100*  --  104*   Cardiac Enzymes: No results for input(s): CKTOTAL, CKMB, CKMBINDEX, TROPONINI in the last 168 hours. BNP (last 3 results)  Recent Labs  09/01/14 1359 09/10/14 0400  BNP 452.2* 629.7*    ProBNP (last 3 results) No results for input(s): PROBNP in the last 8760 hours.  CBG: No results for input(s): GLUCAP in the last 168 hours.  Recent Results (from the past 240 hour(s))  Culture, blood (routine x 2)      Status: None   Collection Time: 09/02/14  8:10 AM  Result Value Ref Range Status   Specimen Description BLOOD RIGHT ARM  Final   Special Requests BOTTLES DRAWN AEROBIC AND ANAEROBIC 10CC  Final   Culture   Final    NO GROWTH 5 DAYS Performed at [facility]    Report Status 09/07/2014 FINAL  Final  Culture, blood (routine x 2)     Status: None (Preliminary result)   Collection Time: 09/02/14  8:15 AM  Result Value Ref Range Status   Specimen Description BLOOD RIGHT HAND  Final   Special Requests BOTTLES DRAWN AEROBIC AND ANAEROBIC 5CC  Final   Culture  Setup Time   Final  GRAM NEGATIVE RODS ANAEROBIC BOTTLE ONLY CRITICAL RESULT CALLED TO, READ BACK BY AND VERIFIED WITH: Nehemiah Settle RN @ [phone] ON [id number] BY Rhea Bleacher    Culture   Final    SALMONELLA SPECIES CRITICAL RESULT CALLED TO, READ BACK BY AND VERIFIED WITH: R JOHNSON 09/05/14 @ 0951 M VESTAL Performed at [facility]    Report Status PENDING  Incomplete   Organism ID, Bacteria SALMONELLA SPECIES  Final      Susceptibility   Salmonella species - MIC*    AMPICILLIN <=2 SENSITIVE Sensitive     LEVOFLOXACIN <=0.12 SENSITIVE Sensitive     TRIMETH/SULFA <=20 SENSITIVE Sensitive     * SALMONELLA SPECIES  MRSA PCR Screening     Status: None   Collection Time: 09/02/14  3:35 PM  Result Value Ref Range Status   MRSA by PCR NEGATIVE NEGATIVE Final    Comment:        The GeneXpert MRSA Assay (FDA approved for NASAL specimens only), is one component of a comprehensive MRSA colonization surveillance program. It is not intended to diagnose MRSA infection nor to guide or monitor treatment for MRSA infections.   CSF culture with Stat gram stain     Status: None   Collection Time: 09/02/14  5:20 PM  Result Value Ref Range Status   Specimen Description CSF  Final   Special Requests Immunocompromised  Final   Gram Stain   Final    CYTOSPIN SMEAR NO WBC SEEN NO ORGANISMS SEEN Gram Stain Report Called to,Read  Back By and Verified With: PAM Margarito Courser [id number] @ 1924 BY J SCOTTON    Culture   Final    NO GROWTH 3 DAYS Performed at [facility]    Report Status 09/06/2014 FINAL  Final  AFB culture with smear     Status: None (Preliminary result)   Collection Time: 09/02/14  5:20 PM  Result Value Ref Range Status   Specimen Description CSF  Final   Special Requests Immunocompromised  Final   Acid Fast Smear   Final    NO ACID FAST BACILLI SEEN Performed at [facility]    Culture   Final    CULTURE WILL BE EXAMINED FOR 6 WEEKS BEFORE ISSUING A FINAL REPORT Performed at [facility]    Report Status PENDING  Incomplete  Fungus Culture with Smear     Status: None (Preliminary result)   Collection Time: 09/02/14  5:20 PM  Result Value Ref Range Status   Specimen Description CSF  Final   Special Requests Immunocompromised  Final   Fungal Smear   Final    NO YEAST OR FUNGAL ELEMENTS SEEN Performed at [facility]    Culture   Final    CULTURE IN PROGRESS FOR FOUR WEEKS Performed at [facility]    Report Status PENDING  Incomplete  Gram stain     Status: None   Collection Time: 09/02/14  5:20 PM  Result Value Ref Range Status   Specimen Description CSF  Final   Special Requests NONE  Final   Gram Stain   Final    NO ORGANISMS SEEN NO WBC SEEN CYTOSPUN SMEAR Gram Stain Report Called to,Read Back By and Verified With: PAM WEST,RN [id number] @ 1924 BY J SCOTTON    Report Status 09/02/2014 FINAL  Final  Clostridium Difficile by PCR (not at [facility])     Status: None   Collection Time: 09/05/14  2:53 AM  Result  Value Ref Range Status   C difficile by pcr NEGATIVE NEGATIVE Final  Cath Tip Culture     Status: None   Collection Time: 09/06/14  8:37 AM  Result Value Ref Range Status   Specimen Description CATH TIP  Final   Special Requests NONE  Final   Culture   Final    NO GROWTH 2 DAYS Performed at [facility]    Report Status 09/08/2014  FINAL  Final  Culture, blood (routine x 2)     Status: None (Preliminary result)   Collection Time: 09/07/14  3:50 AM  Result Value Ref Range Status   Specimen Description BLOOD RIGHT FOREARM  Final   Special Requests BOTTLES DRAWN AEROBIC ONLY  Final   Culture   Final    NO GROWTH 3 DAYS Performed at [facility]    Report Status PENDING  Incomplete  Culture, blood (routine x 2)     Status: None (Preliminary result)   Collection Time: 09/07/14  3:58 AM  Result Value Ref Range Status   Specimen Description BLOOD LEFT HAND  Final   Special Requests BOTTLES DRAWN AEROBIC ONLY  Final   Culture   Final    NO GROWTH 3 DAYS Performed at [facility]    Report Status PENDING  Incomplete     Studies: Dg Chest 2 View  09/10/2014   CLINICAL DATA:  Sepsis with fever. Tachycardia. Altered mental status. Multiple myeloma.  EXAM: CHEST  2 VIEW  COMPARISON:  09/02/2014 CT and plain film.  FINDINGS: Removal of right-sided central line. Numerous leads and wires project over the chest. Midline trachea. Normal heart size. No pleural effusion or pneumothorax. Clear lungs. Pulmonary interstitial thickening  IMPRESSION: Resolved congestive heart failure or atypical infection. No acute findings.   Electronically Signed   By: Abigail Miyamoto M.D.   On: 09/10/2014 12:31    Scheduled Meds: . allopurinol  300 mg Oral Daily  . amiodarone  400 mg Oral BID  . amLODipine  2.5 mg Oral Daily  . antiseptic oral rinse  7 mL Mouth Rinse BID  . aspirin  81 mg Oral Daily  . atorvastatin  80 mg Oral q1800  . carvedilol  12.5 mg Oral BID WC  . feeding supplement (ENSURE ENLIVE)  237 mL Oral BID BM  . feeding supplement (PRO-STAT 64)  30 mL Oral BID BM  . fentaNYL  50 mcg Transdermal Q72H  . heparin subcutaneous  5,000 Units Subcutaneous 3 times per day  . levofloxacin (LEVAQUIN) IV  750 mg Intravenous Q24H  . nystatin  5 mL Oral QID  . pantoprazole  80 mg Oral Q1200  . pregabalin  75 mg Oral BID  .  sodium chloride  3 mL Intravenous Q12H   Continuous Infusions: . dextrose 5 % and 0.45 % NaCl with KCl 20 mEq/L 50 mL/hr at 09/10/14 1000  . norepinephrine (LEVOPHED) Adult infusion Stopped (09/04/14 1630)    Principal Problem:   Chest pain Active Problems:   Multiple myeloma   Essential hypertension   CKD (chronic kidney disease) stage 3, GFR 30-59 ml/min   GERD (gastroesophageal reflux disease)   Abdominal pain, chronic, epigastric   Cancer   FUO (fever of unknown origin)   Lethargy   Tachycardia   Neutropenic fever   Screen for STD (sexually transmitted disease)   Aspergillus pneumonia   Dyspnea   Gram-negative bacteremia   Drug rash   Paroxysmal atrial fibrillation   Vomiting  Salmonella bacteremia   Protein-calorie malnutrition, severe   Thrush of mouth and esophagus    Time spent: 35 min    Velvet Bathe  [facility] Pager [phone] If 7PM-7AM, please contact night-coverage at [email or web address], password [facility] 09/11/2014, 12:17 PM  LOS: 10 days

## 2014-09-11 NOTE — Telephone Encounter (Signed)
Still listed as inpatient. TCM once discharged.

## 2014-09-11 NOTE — Progress Notes (Signed)
Patient Name: Walter Dennis Date of Encounter: 09/11/2014  Principal Problem:   Chest pain Active Problems:   Multiple myeloma   Essential hypertension   CKD (chronic kidney disease) stage 3, GFR 30-59 ml/min   GERD (gastroesophageal reflux disease)   Abdominal pain, chronic, epigastric   Cancer   FUO (fever of unknown origin)   Lethargy   Tachycardia   Neutropenic fever   Screen for STD (sexually transmitted disease)   Aspergillus pneumonia   Dyspnea   Gram-negative bacteremia   Drug rash   Paroxysmal atrial fibrillation   Vomiting   Salmonella bacteremia   Protein-calorie malnutrition, severe   Thrush of mouth and esophagus   SUBJECTIVE  Feels better. Denies chest pain, sob or palpitation.   CURRENT MEDS . allopurinol  300 mg Oral Daily  . amiodarone  400 mg Oral BID  . amLODipine  2.5 mg Oral Daily  . antiseptic oral rinse  7 mL Mouth Rinse BID  . aspirin  81 mg Oral Daily  . atorvastatin  80 mg Oral q1800  . carvedilol  12.5 mg Oral BID WC  . feeding supplement (ENSURE ENLIVE)  237 mL Oral BID BM  . feeding supplement (PRO-STAT 64)  30 mL Oral BID BM  . fentaNYL  50 mcg Transdermal Q72H  . heparin subcutaneous  5,000 Units Subcutaneous 3 times per day  . levofloxacin (LEVAQUIN) IV  750 mg Intravenous Q24H  . nystatin  5 mL Oral QID  . pantoprazole  80 mg Oral Q1200  . pregabalin  75 mg Oral BID  . sodium chloride  3 mL Intravenous Q12H    OBJECTIVE  Filed Vitals:   09/10/14 1600 09/10/14 1705 09/10/14 2102 09/11/14 0545  BP: 149/68 158/76 148/55 154/87  Pulse:      Temp: 98.2 F (36.8 C) 99 F (37.2 C) 98.5 F (36.9 C) 98.9 F (37.2 C)  TempSrc: Oral Oral Oral Oral  Resp: 27 14 17 16   Height:      Weight:      SpO2: 97% 100% 100% 91%    Intake/Output Summary (Last 24 hours) at 09/11/14 0739 Last data filed at 09/11/14 0600  Gross per 24 hour  Intake 2438.33 ml  Output   4600 ml  Net -2161.67 ml   Filed Weights   09/02/14 1300 09/03/14  0400 09/10/14 1135  Weight: 225 lb 15.5 oz (102.5 kg) 218 lb 4.1 oz (99 kg) 215 lb (97.523 kg)    PHYSICAL EXAM  General: Pleasant, WD, [facility] male in NAD. Neuro: Alert and oriented X 3. Moves all extremities spontaneously. Psych: Normal affect. HEENT:  Normal   Neck: Supple without bruits. No JVD.  Lungs:  Resp regular and unlabored.  Faint bibasilar rales, L>R.  Heart: RRR no s3, s4, or murmurs. Abdomen: Soft, non-tender, non-distended, BS + x 4.  Extremities: No clubbing, cyanosis or edema. DP/PT/Radials 2+ and equal bilaterally.  Accessory Clinical Findings  CBC  Recent Labs  09/10/14 0400 09/10/14 2045 09/11/14 0505  WBC 3.3*  --  3.6*  NEUTROABS 1.7  --  1.8  HGB 7.7* 8.6* 8.3*  HCT 23.3* 27.0* 25.1*  MCV 87.9  --  85.7  PLT 100*  --  545*   Basic Metabolic Panel  Recent Labs  09/10/14 0400 09/11/14 0505  NA 139 139  K 3.7 3.3*  CL 110 105  CO2 22 26  GLUCOSE 118* 107*  BUN 13 14  CREATININE 1.53* 1.53*  CALCIUM 10.1 10.0  MG  1.7 1.8   Liver Function Tests  Recent Labs  09/10/14 0400 09/11/14 0505  AST 24 19  ALT 29 25  ALKPHOS 126 125  BILITOT 0.6 0.8  PROT 7.8 7.8  ALBUMIN 2.1* 2.4*    TELE NSR at rate of 50s. Short runs of NSVT last night? MD to review.   Radiology/Studies  Dg Chest 2 View  09/10/2014   CLINICAL DATA:  Sepsis with fever. Tachycardia. Altered mental status. Multiple myeloma.  EXAM: CHEST  2 VIEW  COMPARISON:  09/02/2014 CT and plain film.  FINDINGS: Removal of right-sided central line. Numerous leads and wires project over the chest. Midline trachea. Normal heart size. No pleural effusion or pneumothorax. Clear lungs. Pulmonary interstitial thickening  IMPRESSION: Resolved congestive heart failure or atypical infection. No acute findings.   Electronically Signed   By: Abigail Miyamoto M.D.   On: 09/10/2014 12:31   Dg Chest 2 View  09/01/2014   CLINICAL DATA:  Shortness of breath and chest pain for 2 days  EXAM: CHEST - 2 VIEW   COMPARISON:  08/08/2014  FINDINGS: Cardiac shadow is at the upper limits of normal in size but stable. The lungs are well aerated bilaterally. No focal infiltrate or sizable effusion is seen. A dual-lumen chest wall port is noted on the right stable from the prior exam. No acute bony abnormality is seen per  IMPRESSION: No active disease.   Electronically Signed   By: Inez Catalina M.D.   On: 09/01/2014 15:38   Dg Abd 1 View  09/05/2014   CLINICAL DATA:  Ongoing nausea, vomiting since last night. Mid abdominal discomfort.  EXAM: ABDOMEN - 1 VIEW  COMPARISON:  None.  FINDINGS: Nonobstructive bowel gas pattern. No supine evidence of free air. No organomegaly or suspicious calcification. No acute bony abnormality. Visualized lung bases clear.  IMPRESSION: No acute findings.   Electronically Signed   By: Rolm Baptise M.D.   On: 09/05/2014 11:06   Ct Head Wo Contrast  09/02/2014   CLINICAL DATA:  Lethargy. Right arm drift. Somnolence and confusion  EXAM: CT HEAD WITHOUT CONTRAST  TECHNIQUE: Contiguous axial images were obtained from the base of the skull through the vertex without intravenous contrast.  COMPARISON:  None.  FINDINGS: Mild prominence of the sulci and ventricles noted consistent with brain atrophy. Subtle low attenuation within the subcortical and periventricular white matter noted. There is no abnormal extra-axial fluid collection, intracranial hemorrhage or infarct. There is mild mucosal thickening involving the left maxillary sinus. Retention cysts versus polyps noted within both maxillary sinuses. The mastoid air cells are clear. The calvarium is intact.  IMPRESSION: 1. No acute intracranial abnormalities.   Electronically Signed   By: Kerby Moors M.D.   On: 09/02/2014 15:03   Ct Chest Wo Contrast  09/01/2014   CLINICAL DATA:  Dull generalized chest pain, shortness of breath, diaphoresis starting around 0730 hours. History of myeloma. Ongoing chemotherapy treatment.  EXAM: CT CHEST WITHOUT  CONTRAST  TECHNIQUE: Multidetector CT imaging of the chest was performed following the standard protocol without IV contrast.  COMPARISON:  08/05/2014  FINDINGS: CT CHEST FINDINGS  Mediastinum/Nodes: Heart is normal size. Coronary artery calcifications in the 3 major coronary vessels. Aortic calcifications. No aneurysm. Small scattered mediastinal lymph nodes, none pathologically enlarged. No axillary or visible hilar adenopathy.  Lungs/Pleura: Lungs are clear. No focal airspace opacities or suspicious nodules. No effusions.  Chest wall: Right chest wall Port-A-Cath noted in place. The tip is in the SVC.  Mild bilateral gynecomastia. Chest wall soft tissues otherwise unremarkable.  Upper abdomen: Mild diffuse fatty infiltration of the liver. No acute findings in the visualized upper abdomen.  Musculoskeletal: No acute bony abnormality.  IMPRESSION: Coronary artery disease.  Fatty infiltration of the liver.  No acute findings.   Electronically Signed   By: Rolm Baptise M.D.   On: 09/01/2014 17:29   Ct Angio Chest Pe W/cm &/or Wo Cm  09/02/2014   CLINICAL DATA:  64 year old male with sudden onset confusion, altered mental status, diaphoresis, lethargy. Multiple myeloma, chemotherapy underway. Initial encounter.  EXAM: CT ANGIOGRAPHY CHEST WITH CONTRAST  TECHNIQUE: Multidetector CT imaging of the chest was performed using the standard protocol during bolus administration of intravenous contrast. Multiplanar CT image reconstructions and MIPs were obtained to evaluate the vascular anatomy.  CONTRAST:  183m OMNIPAQUE IOHEXOL 350 MG/ML SOLN  COMPARISON:  Noncontrast chest CT from yesterday, chest CTA 08/05/2014, and earlier  FINDINGS: Suboptimal contrast bolus timing in the pulmonary arterial tree. Increased respiratory motion artifact compared to the 08/05/2014 exam. The central pulmonary arteries remain patent. No hilar pulmonary artery filling defect identified. No lobar segmental filling defect identified. The more  distal branches are not well evaluated today.  Trace bilateral pleural effusions are new since yesterday. Dependent and right middle lobe atelectasis. Major airways remain patent. No consolidation.  Cardiomegaly. No pericardial effusion. Stable mediastinal nodes. Aortic and Coronary artery calcified atherosclerosis. No thoracic aortic dissection or aneurysm. Right chest porta cath. No axillary lymphadenopathy.  Hepatic steatosis, visible spleen, and bowel in the upper abdomen are stable.  Abnormal bone mineralization throughout the visible skeleton is stable since May. No acute fracture or dislocation identified in the thorax.  Review of the MIP images confirms the above findings.  IMPRESSION: 1. Suboptimal exam with regard to distal pulmonary arteries, no acute pulmonary embolus identified. 2. Trace pleural effusions are new since yesterday. Pulmonary atelectasis. 3. No other acute findings in the chest. Diffuse myelomatous skeletal involvement.   Electronically Signed   By: HGenevie AnnM.D.   On: 09/02/2014 15:09   Mr BJeri CosWZOContrast  09/02/2014   CLINICAL DATA:  Patient has myeloma. Shortness of breath, dizziness, and diaphoresis beginning earlier today. Fever of unknown origin.  EXAM: MRI HEAD WITHOUT AND WITH CONTRAST  TECHNIQUE: Multiplanar, multiecho pulse sequences of the brain and surrounding structures were obtained without and with intravenous contrast.  CONTRAST:  210mMULTIHANCE GADOBENATE DIMEGLUMINE 529 MG/ML IV SOLN  COMPARISON:  CT head earlier today.  FINDINGS: Equipment malfunction necessitated using an older model head coil. Furthermore patient motion is present. Image quality is reduced.  No evidence for acute infarction, hemorrhage, mass lesion, hydrocephalus, or extra-axial fluid. Mild cerebral and cerebellar atrophy. Mild subcortical and periventricular T2 and FLAIR hyperintensities, likely chronic microvascular ischemic change. Flow voids are maintained throughout the carotid, basilar,  and vertebral arteries. There are no areas of chronic hemorrhage.  Post infusion, no abnormal enhancement of the brain or meninges. Chronic LEFT maxillary sinus disease.  Heterogeneity of the skull base, and visualized portions of C2 and C3 vertebrae, also patchy heterogeneity of the diploic space, without expansile lesion, could represent myeloma involvement. No concern for pathologic fracture of the upper cervical vertebrae in those segments which are visualized.  IMPRESSION: Mild atrophy and small vessel disease. No acute intracranial findings.  No abnormal postcontrast enhancement of the brain or meninges. Specifically no evidence for cerebritis or meningitis.  Heterogeneity of the skull base and upper cervical region, less  so the diploic space, but concern for bone marrow involvement with myeloma.   Electronically Signed   By: Staci Righter M.D.   On: 09/02/2014 20:11   Dg Chest Port 1 View  09/02/2014   CLINICAL DATA:  Shortness of Breath  EXAM: PORTABLE CHEST - 1 VIEW  COMPARISON:  Chest radiograph and chest CT September 01, 2014  FINDINGS: There is generalized interstitial edema. There is no airspace consolidation. Heart is borderline enlarged with pulmonary venous hypertension. Port-A-Cath tip is in superior vena cava. No pneumothorax. No adenopathy.  IMPRESSION: Evidence of congestive heart failure.   Electronically Signed   By: Lowella Grip III M.D.   On: 09/02/2014 12:25    Echo 09/03/14 LV EF: 45% -  50%  ------------------------------------------------------------------- Indications:   Chest pain 786.51.  ------------------------------------------------------------------- History:  PMH: Cancer. Risk factors: Hypertension.  ------------------------------------------------------------------- Study Conclusions  - Left ventricle: Abnormal septal motion Inferobasal hypokinesis The cavity size was normal. Wall thickness was normal. Systolic function was mildly reduced.  The estimated ejection fraction was in the range of 45% to 50%. - Left atrium: The atrium was mildly dilated. - Atrial septum: No defect or patent foramen ovale was identified.  ASSESSMENT AND PLAN  64 yo male w/ mult myeloma (chemo), HTN, Aspergillosis, and chest pain (neg MV 2014), was admitted 06/20 w/ chest pain. Sepsis w/ infected port-a-cath removed 06/25. Blood cx + Salmonella. Afib 06/26.   1. Chest pain/dyspnea - concerning for unstable angina -  borderline flat trop trend, coronary calcification noted on CT (previously neg cath 2012 in [place] and neg NST 10/2012) and new LBBB - Will differ inpatient  ischemic eval in setting of acute illness; f/u with Dr Acie Fredrickson (arranged) and discuss workup with him.  - echo with Ef 45-50% mildly reduced; Abnormal septal motion; Inferobasal hypokinesis.  last nuc 07/2013 his EF was 54% - continue ASA, coreg, lipitor. No ACEI or ARB with AKI - recheck echo in 3 months.  2. PAF with RVR  - Loaded with IV amio 06/23>06/27, now on PO amiodarone 452m BID for 7 days (06/27>07/04) or until discharge, then 404mQD for a month, then 20066maily afterward (hopefully can discontinue at some point) - Not a candidate for systemic anticoagulation due to comorbidities and acute illness. Currently on ASA 75m32m - Currently in sinus. Tele is concerning for short runs of NSVT last night. MD to review further. K of 3.3 this AM, will give supplement. Magnesium normal.   3. Acute renal injury - Avoid nephrotoxic agents - Creatinine stable to 1.53  4. Possible volume overload - given V Lasix 40 mg x 1 yesterday. Had 2L of diuresis yesterday. No weight is measured today. Asymptomatic. Continue to monitor.  - decreased IVF to 50 cc/hr and hopefully can d/c as PO intake improves.  5. HTN - Norvasc added yesterday. BP improved, still in range of 150s/60s. Continue to monitor. May need to titrate further if no improvement.   6. AMS - lumbar puncture  6/21 (neg csf cx) - in the setting of multiple medical issues, including multiple myeloma, chemotherapy, worsening pancytopenia and fever  7. Multiple myeloma with pancytopenia - per IM  8. Rash without pruiritis suspect due to beta lactams and zosyn - per IM  9. Septic shock related to Salmonella bacteremia: port-a-cath removed by Surgery in OR 09/06/2014 - shock resolved  Signed, Bhagat,Bhavinkumar PA-C Pager [phone]  History and all data above reviewed.  Patient examined.  I agree with the  findings as above.  Nauseated this AM but otherwise feels well.  Denies any palpitations.   The patient exam reveals COR: RRR  ,  Lungs: Clear  ,  Abd: Positive bowel sounds, no rebound no guarding, Ext No edema  .  All available labs, radiology testing, previous records reviewed. Agree with documented assessment and plan. Atrial fib:  I reviewed all of his telemetry.  He has LBBB as previously noted.  He has some junctional rhythm.  He has short runs of atrial fib.  No change to therapy.  (He can come off of tele to shower when he moves to the floor.)  HTN:  Started Norvasc yesterday.  No change in therapy.    Jeneen Rinks Aliyanah Rozas  10:44 AM  09/11/2014

## 2014-09-11 NOTE — Progress Notes (Signed)
09/11/14  0806  C-Diff results came back negative on 09/05/14. Patient was not on contact this morning.

## 2014-09-12 ENCOUNTER — Encounter (HOSPITAL_COMMUNITY): Payer: Self-pay | Admitting: Internal Medicine

## 2014-09-12 DIAGNOSIS — R0600 Dyspnea, unspecified: Secondary | ICD-10-CM

## 2014-09-12 DIAGNOSIS — B3781 Candidal esophagitis: Secondary | ICD-10-CM

## 2014-09-12 DIAGNOSIS — N183 Chronic kidney disease, stage 3 (moderate): Secondary | ICD-10-CM

## 2014-09-12 DIAGNOSIS — D709 Neutropenia, unspecified: Secondary | ICD-10-CM

## 2014-09-12 DIAGNOSIS — A029 Salmonella infection, unspecified: Secondary | ICD-10-CM

## 2014-09-12 DIAGNOSIS — R7881 Bacteremia: Secondary | ICD-10-CM

## 2014-09-12 DIAGNOSIS — A021 Salmonella sepsis: Principal | ICD-10-CM

## 2014-09-12 DIAGNOSIS — R5381 Other malaise: Secondary | ICD-10-CM | POA: Diagnosis present

## 2014-09-12 DIAGNOSIS — E43 Unspecified severe protein-calorie malnutrition: Secondary | ICD-10-CM

## 2014-09-12 DIAGNOSIS — R110 Nausea: Secondary | ICD-10-CM

## 2014-09-12 DIAGNOSIS — R5383 Other fatigue: Secondary | ICD-10-CM

## 2014-09-12 DIAGNOSIS — A282 Extraintestinal yersiniosis: Secondary | ICD-10-CM

## 2014-09-12 DIAGNOSIS — B370 Candidal stomatitis: Secondary | ICD-10-CM

## 2014-09-12 LAB — COMPREHENSIVE METABOLIC PANEL
ALT: 24 U/L (ref 17–63)
ANION GAP: 8 (ref 5–15)
AST: 20 U/L (ref 15–41)
Albumin: 2.4 g/dL — ABNORMAL LOW (ref 3.5–5.0)
Alkaline Phosphatase: 107 U/L (ref 38–126)
BUN: 16 mg/dL (ref 6–20)
CALCIUM: 10.1 mg/dL (ref 8.9–10.3)
CO2: 26 mmol/L (ref 22–32)
Chloride: 104 mmol/L (ref 101–111)
Creatinine, Ser: 1.48 mg/dL — ABNORMAL HIGH (ref 0.61–1.24)
GFR calc Af Amer: 57 mL/min — ABNORMAL LOW (ref >60)
GFR, EST NON AFRICAN AMERICAN: 49 mL/min — AB (ref >60)
Glucose, Bld: 107 mg/dL — ABNORMAL HIGH (ref 65–99)
Potassium: 3.6 mmol/L (ref 3.5–5.1)
Sodium: 138 mmol/L (ref 135–145)
Total Bilirubin: 0.9 mg/dL (ref 0.3–1.2)
Total Protein: 8.1 g/dL (ref 6.5–8.1)

## 2014-09-12 LAB — CULTURE, BLOOD (ROUTINE X 2)
Culture: NO GROWTH
Culture: NO GROWTH

## 2014-09-12 LAB — CBC WITH DIFFERENTIAL/PLATELET
BASOS PCT: 2 % — AB (ref 0–1)
Basophils Absolute: 0.1 10*3/uL (ref 0.0–0.1)
EOS ABS: 0.3 10*3/uL (ref 0.0–0.7)
Eosinophils Relative: 9 % — ABNORMAL HIGH (ref 0–5)
HCT: 27.7 % — ABNORMAL LOW (ref 39.0–52.0)
Hemoglobin: 9.2 g/dL — ABNORMAL LOW (ref 13.0–17.0)
Lymphocytes Relative: 23 % (ref 12–46)
Lymphs Abs: 0.7 10*3/uL (ref 0.7–4.0)
MCH: 29.1 pg (ref 26.0–34.0)
MCHC: 33.2 g/dL (ref 30.0–36.0)
MCV: 87.7 fL (ref 78.0–100.0)
Monocytes Absolute: 0.4 10*3/uL (ref 0.1–1.0)
Monocytes Relative: 15 % — ABNORMAL HIGH (ref 3–12)
NEUTROS ABS: 1.4 10*3/uL — AB (ref 1.7–7.7)
NEUTROS PCT: 51 % (ref 43–77)
PLATELETS: 113 10*3/uL — AB (ref 150–400)
RBC: 3.16 MIL/uL — ABNORMAL LOW (ref 4.22–5.81)
RDW: 17.4 % — AB (ref 11.5–15.5)
WBC: 2.9 10*3/uL — ABNORMAL LOW (ref 4.0–10.5)
nRBC: 7 /100 WBC — ABNORMAL HIGH

## 2014-09-12 LAB — MAGNESIUM: MAGNESIUM: 1.7 mg/dL (ref 1.7–2.4)

## 2014-09-12 LAB — PROTIME-INR
INR: 1.15 (ref 0.00–1.49)
Prothrombin Time: 14.9 seconds (ref 11.6–15.2)

## 2014-09-12 MED ORDER — ENOXAPARIN SODIUM 40 MG/0.4ML ~~LOC~~ SOLN
40.0000 mg | Freq: Every day | SUBCUTANEOUS | Status: DC
  Administered 2014-09-12 – 2014-09-13 (×2): 40 mg via SUBCUTANEOUS
  Filled 2014-09-12 (×2): qty 0.4

## 2014-09-12 MED ORDER — PROMETHAZINE HCL 25 MG PO TABS
12.5000 mg | ORAL_TABLET | Freq: Once | ORAL | Status: DC
  Filled 2014-09-12: qty 1

## 2014-09-12 MED ORDER — ISOSORB DINITRATE-HYDRALAZINE 20-37.5 MG PO TABS
0.5000 | ORAL_TABLET | Freq: Two times a day (BID) | ORAL | Status: DC
  Administered 2014-09-12 – 2014-09-13 (×2): 0.5 via ORAL
  Filled 2014-09-12 (×2): qty 1

## 2014-09-12 MED ORDER — ONDANSETRON HCL 4 MG PO TABS
4.0000 mg | ORAL_TABLET | Freq: Four times a day (QID) | ORAL | Status: DC | PRN
Start: 2014-09-12 — End: 2014-09-13
  Administered 2014-09-12 – 2014-09-13 (×2): 4 mg via ORAL
  Filled 2014-09-12 (×2): qty 1

## 2014-09-12 MED ORDER — LEVOFLOXACIN 750 MG PO TABS
750.0000 mg | ORAL_TABLET | Freq: Every day | ORAL | Status: DC
  Administered 2014-09-12 – 2014-09-13 (×2): 750 mg via ORAL
  Filled 2014-09-12 (×2): qty 1

## 2014-09-12 NOTE — Progress Notes (Signed)
Physical Therapy Treatment Patient Details Name: Walter Dennis MRN: [id number] DOB: 06/06/1950 Today's Date: 09/12/2014    History of Present Illness 63 yo male admitted with exertional chest pain, new LBBB, sepsis, dyspnea, + troponins. Hx of myeloma, HTN. Pt was Ind PTA    PT Comments    Pt feeling better but still weak.  Assisted OOB to amb ro BR then amb in hallway with out holding to IV pole.  Encouraged pt to also amb with family as tolerated.   Follow Up Recommendations  Home health PT     Equipment Recommendations  None recommended by PT    Recommendations for Other Services       Precautions / Restrictions Precautions Precautions: Fall Restrictions Weight Bearing Restrictions: No    Mobility  Bed Mobility               General bed mobility comments: pt OOB  Transfers Overall transfer level: Needs assistance Equipment used: 1 person hand held assist Transfers: Sit to/from Stand Sit to Stand: Min guard;Supervision         General transfer comment: close guard for safety  Ambulation/Gait Ambulation/Gait assistance: Supervision;Min guard Ambulation Distance (Feet): 500 Feet Assistive device: 1 person hand held assist Gait Pattern/deviations: Step-through pattern;Decreased stride length Gait velocity: slow   General Gait Details: tolerated increased distance.  Min c/o B LE fatigue.  Amb with out holding to IV pole as well.    Stairs            Wheelchair Mobility    Modified Rankin (Stroke Patients Only)       Balance                                    Cognition Arousal/Alertness: Awake/alert Behavior During Therapy: WFL for tasks assessed/performed Overall Cognitive Status: Within Functional Limits for tasks assessed                      Exercises      General Comments        Pertinent Vitals/Pain Pain Assessment: No/denies pain    Home Living                      Prior Function           PT Goals (current goals can now be found in the care plan section) Progress towards PT goals: Progressing toward goals    Frequency  Min 3X/week    PT Plan Current plan remains appropriate    Co-evaluation             End of Session Equipment Utilized During Treatment: Gait belt Activity Tolerance: Patient tolerated treatment well Patient left: in chair;with call bell/phone within reach;with family/visitor present     Time: 6160-7371 PT Time Calculation (min) (ACUTE ONLY): 20 min  Charges:  $Gait Training: 8-22 mins                    G Codes:      Rica Koyanagi  PTA [facility]  Acute  Rehab Pager      [phone]

## 2014-09-12 NOTE — Progress Notes (Signed)
Patient Name: Spero Gunnels Date of Encounter: 09/12/2014  Principal Problem:   Chest pain Active Problems:   Multiple myeloma   Essential hypertension   CKD (chronic kidney disease) stage 3, GFR 30-59 ml/min   GERD (gastroesophageal reflux disease)   Abdominal pain, chronic, epigastric   Cancer   FUO (fever of unknown origin)   Lethargy   Tachycardia   Neutropenic fever   Screen for STD (sexually transmitted disease)   Aspergillus pneumonia   Dyspnea   Gram-negative bacteremia   Drug rash   Paroxysmal atrial fibrillation   Vomiting   Salmonella bacteremia   Protein-calorie malnutrition, severe   Thrush of mouth and esophagus   Primary Cardiologist: Nahser  Patient Profile: 64 yo male w/ mult myeloma (chemo), HTN, Aspergillosis, and chest pain (neg MV 2014), was admitted 06/20 w/ chest pain. Sepsis w/ infected port-a-cath removed 06/25. Blood cx + Salmonella. Afib 06/26 and intermittent since then.  SUBJECTIVE: Eating and drinking OK, no appetite. Aware of only 1 episode PAF last 24 hours. Breathing better.  OBJECTIVE Filed Vitals:   09/11/14 1500 09/11/14 2141 09/12/14 0544 09/12/14 0552  BP: 130/61 134/69 146/71   Pulse: 65 56 61   Temp: 98.5 F (36.9 C) 98.7 F (37.1 C) 98.2 F (36.8 C)   TempSrc: Oral Oral Oral   Resp: 14 20 20    Height:      Weight:    209 lb 3.2 oz (94.892 kg)  SpO2: 98% 99% 98%     Intake/Output Summary (Last 24 hours) at 09/12/14 0828 Last data filed at 09/12/14 0700  Gross per 24 hour  Intake   1390 ml  Output    901 ml  Net    489 ml   Filed Weights   09/03/14 0400 09/10/14 1135 09/12/14 0552  Weight: 218 lb 4.1 oz (99 kg) 215 lb (97.523 kg) 209 lb 3.2 oz (94.892 kg)    PHYSICAL EXAM General: Well developed, well nourished, male in no acute distress. Head: Normocephalic, atraumatic.  Neck: Supple without bruits, JVD not elevated Lungs:  Resp regular and unlabored, CTA. Heart: RRR, S1, S2, no S3, S4, 2/6 murmur; no  rub. Abdomen: Soft, non-tender, non-distended, BS + x 4.  Extremities: No clubbing, cyanosis, no edema.  Neuro: Alert and oriented X 3. Moves all extremities spontaneously. Psych: Normal affect.  LABS: CBC: Recent Labs  09/11/14 0505 09/12/14 0425  WBC 3.6* 2.9*  NEUTROABS 1.8 1.4*  HGB 8.3* 9.2*  HCT 25.1* 27.7*  MCV 85.7 87.7  PLT 104* 113*   INR: Recent Labs  09/12/14 0425  INR 0.93   Basic Metabolic Panel: Recent Labs  09/11/14 0505 09/12/14 0425  NA 139 138  K 3.3* 3.6  CL 105 104  CO2 26 26  GLUCOSE 107* 107*  BUN 14 16  CREATININE 1.53* 1.48*  CALCIUM 10.0 10.1  MG 1.8 1.7   Liver Function Tests: Recent Labs  09/11/14 0505 09/12/14 0425  AST 19 20  ALT 25 24  ALKPHOS 125 107  BILITOT 0.8 0.9  PROT 7.8 8.1  ALBUMIN 2.4* 2.4*   BNP:  B NATRIURETIC PEPTIDE  Date/Time Value Ref Range Status  09/10/2014 04:00 AM 629.7* 0.0 - 100.0 pg/mL Final  09/01/2014 01:59 PM 452.2* 0.0 - 100.0 pg/mL Final   TELE:  SR, mult short runs of PAF w/ post-term pauses < 2.5 sec  Radiology/Studies: Dg Chest 2 View  09/10/2014   CLINICAL DATA:  Sepsis with fever. Tachycardia. Altered  mental status. Multiple myeloma.  EXAM: CHEST  2 VIEW  COMPARISON:  09/02/2014 CT and plain film.  FINDINGS: Removal of right-sided central line. Numerous leads and wires project over the chest. Midline trachea. Normal heart size. No pleural effusion or pneumothorax. Clear lungs. Pulmonary interstitial thickening  IMPRESSION: Resolved congestive heart failure or atypical infection. No acute findings.   Electronically Signed   By: Abigail Miyamoto M.D.   On: 09/10/2014 12:31     Current Medications:  . allopurinol  300 mg Oral Daily  . amiodarone  400 mg Oral BID  . amLODipine  2.5 mg Oral Daily  . aspirin  81 mg Oral Daily  . atorvastatin  80 mg Oral q1800  . carvedilol  12.5 mg Oral BID WC  . feeding supplement (ENSURE ENLIVE)  237 mL Oral BID BM  . feeding supplement (PRO-STAT 64)  30  mL Oral BID BM  . fentaNYL  50 mcg Transdermal Q72H  . heparin subcutaneous  5,000 Units Subcutaneous 3 times per day  . levofloxacin (LEVAQUIN) IV  750 mg Intravenous Q24H  . nystatin  5 mL Oral QID  . pantoprazole  80 mg Oral Q1200  . pregabalin  75 mg Oral BID  . sodium chloride  3 mL Intravenous Q12H   . dextrose 5 % and 0.45 % NaCl with KCl 20 mEq/L 50 mL/hr at 09/11/14 2029    ASSESSMENT AND PLAN: 1. Chest pain/dyspnea - concerning for unstable angina - borderline flat trop trend, coronary calcification noted on CT (previously neg cath 2012 in [place] and neg NST 10/2012) and new LBBB - Will defer inpatient ischemic eval in setting of acute illness; f/u with Dr Acie Fredrickson (arranged) and discuss workup with him.  - echo with Ef 45-50% mildly reduced; Abnormal septal motion; Inferobasal hypokinesis.   - last nuc5/2015 his EF was 54% - continue ASA, coreg, lipitor. No ACEI or ARB with AKI - recheck echo in 3 months.  2. PAF with RVR  - Loaded with IV amio 06/23>06/27, now on PO amiodarone 46m BID for 7 days (06/27>07/04) or until discharge, then 4073mQD for a month, then 20063maily afterward (hopefully can discontinue at some point) - Not a candidate for systemic anticoagulation due to comorbidities and acute illness. Currently on ASA 63m62m- Currently in sinus. Short runs of PAF overnight. K of 3.3 on 06/30, s/p supplement. Magnesium normal.   3. Acute renal injury - Avoid nephrotoxic agents - Creatinine improved slightly to 1.48  4. Possible volume overload - given V Lasix 40 mg x 1 06/29. Had 2L of diuresis. Improved.  - Continue to follow weights, I/O - still on IVF to 50 cc/hr, RN to ask IM if can d/c as PO intake has improved. - would restart PO Lasix at home dose of 40 mg daily once PO intake has normalized.  5. HTN - Norvasc added 06/29.  - BP improved, still in range of 130s/50s.  - HR 50s at times, no change in BB - with LVD, would benefit from  BiDil, will add at low dose, 1/2 tab daily  6. AMS - lumbar puncture 6/21 (neg csf cx) - in the setting of multiple medical issues, including multiple myeloma, chemotherapy, worsening pancytopenia and fever  7. Multiple myeloma with pancytopenia - per IM  8. Rash without pruiritis suspect due to beta lactams and zosyn - per IM  9. Septic shock related to Salmonella bacteremia: port-a-cath removed by Surgery in OR 09/06/2014 - shock  resolved  Otherwise, per IM. Cards to see again prn. Principal Problem:   Chest pain Active Problems:   Multiple myeloma   Essential hypertension   CKD (chronic kidney disease) stage 3, GFR 30-59 ml/min   GERD (gastroesophageal reflux disease)   Abdominal pain, chronic, epigastric   Cancer   FUO (fever of unknown origin)   Lethargy   Tachycardia   Neutropenic fever   Screen for STD (sexually transmitted disease)   Aspergillus pneumonia   Dyspnea   Gram-negative bacteremia   Drug rash   Paroxysmal atrial fibrillation   Vomiting   Salmonella bacteremia   Protein-calorie malnutrition, severe   Thrush of mouth and esophagus   Signed, Barrett, Rhonda , PA-C 8:28 AM 09/12/2014   History and all data above reviewed.  Patient examined.  I agree with the findings as above. No chest pain.  No SOB.  Anxious to go home tomorrow  The patient exam reveals COR:RRR  ,  Lungs: Clear  ,  Abd: , Ext no edema  .  All available labs, radiology testing, previous records reviewed. Agree with documented assessment and plan. Atrial fib:  Send home on 400 mg po daily amio.  Not on anticoagulation at this point.  I think that if we see atrial fib in the future and his other medical conditions including pancytopenia are better we will need to consider this.  HTN:  Bidil started.  Continue meds as currently listed.   Jeneen Rinks Sigourney Portillo  11:57 AM  09/12/2014

## 2014-09-12 NOTE — Progress Notes (Addendum)
Progress Note   Walter Dennis WYO:[id number] DOB: 15-Dec-1950 DOA: 09/01/2014 PCP: Effie Berkshire A   Brief Narrative:   Walter Dennis is an 64 y.o. 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 He started bendamustine and pomalidomide on 08/29/2014. He has history of HTN. Per patient, he has had fevers, chills, night sweats attributed to his MM for the last year. He will rigor and have associated SOB which typically resolves on its own. Separate from these symptoms, he has had intermittent severe exertional chest pain which radiates to both shoulders which is severe and associated with SOB, mild nausea, significant lightheadedness and diaphoresis. Over the last few weeks, he has had difficulty walking up a flight of stairs before becoming SOB. The last few days, he cannot walk across a room before he develops chest pain. Pain is relieved by rest. He had a CT scan which demonstrated no evidence of pneumonia or PE but did demonstrated three vessel calcifications. Per oncology, his chemotherapy does not typically cause myocarditis or pericarditis or acute heart failure. Cardiology has been consulted for possible unstable angina.   Assessment/Plan:   Principal Problem:   Chest pain rule out unstable angina / new LBBB / mildly elevated troponin - Being followed by cardiology. - Previous catheterization 2012 was negative. - Inpatient ischemic evaluation deferred secondary to acute illness. Will follow-up with cardiology post hospitalization. - 2-D echo done 09/03/14. EF 45-50 percent with abnormal septal motion and inferobasal hypokinesis. - Continue aspirin, Coreg, Lipitor. No ACEI or ARB with AKI. - Repeat echocardiogram in 3 months per cardiology recommendations.  Active Problems:   Acute respiratory failure - Possibly from CHF given findings of  echo noted above. Currently well compensated.    Salmonella bacteremia / neutropenic fever - Continue Levaquin per ID recommendations. Switch to oral route. - Port-A-Cath removed 09/06/14 per ID recommendations.    Multiple myeloma / chemotherapy associated pancytopenia - Per oncology. - He plans to relocate to [place] within the next few months.  - Dr. Benay Spice will contact Dr. Amalia Hailey at [facility] to discuss off protocol treatment options. - Continue to monitor blood counts and transfuse for hemoglobin less than 7.    Essential hypertension - Continue amlodipine, Coreg and BiDil.    CKD (chronic kidney disease) stage 3, GFR 30-59 ml/min - Baseline creatinine around 1.4-1.5. Current creatinine consistent with usual baseline values.    GERD (gastroesophageal reflux disease) - Continue PPI therapy.    Abdominal pain, chronic, epigastric - Continue pain control efforts. Currently being managed with fentanyl patch.    Physical deconditioning - Seen and evaluated by physical therapy. Home PT will be ordered at discharge.    Paroxysmal atrial fibrillation with RVR - Status post amiodarone load, now on oral amiodarone BID scheduled through 09/15/14 or until discharge. - Amiodarone can then be reduced to 400 mg daily 1 month, then 200 mg daily. - Continue aspirin. Not a candidate for systemic anticoagulation secondary to comorbidities. - Keep potassium/magnesium within normal limits.    Protein-calorie malnutrition, severe - Continue nutritional supplements.    Thrush of mouth and esophagus - Continue nystatin.    Hyponatremia  -Resolved.    Acute urinary retention,  - Foley catheter placed.    DVT Prophylaxis - On Lovenox.  Family Communication: Wife updated at the bedside. Disposition Plan: Home when stable, possibly tomorrow if nausea improved. Code Status:  Code Status Orders        Start     Ordered   09/01/14 1939  Full code   Continuous     09/01/14 1939         IV Access:    Peripheral IV   Procedures and diagnostic studies:   Dg Chest 2 View  09/10/2014   CLINICAL DATA:  Sepsis with fever. Tachycardia. Altered mental status. Multiple myeloma.  EXAM: CHEST  2 VIEW  COMPARISON:  09/02/2014 CT and plain film.  FINDINGS: Removal of right-sided central line. Numerous leads and wires project over the chest. Midline trachea. Normal heart size. No pleural effusion or pneumothorax. Clear lungs. Pulmonary interstitial thickening  IMPRESSION: Resolved congestive heart failure or atypical infection. No acute findings.   Electronically Signed   By: Walter Dennis M.D.   On: 09/10/2014 12:31   Dg Chest 2 View  09/01/2014   CLINICAL DATA:  Shortness of breath and chest pain for 2 days  EXAM: CHEST - 2 VIEW  COMPARISON:  08/08/2014  FINDINGS: Cardiac shadow is at the upper limits of normal in size but stable. The lungs are well aerated bilaterally. No focal infiltrate or sizable effusion is seen. A dual-lumen chest wall port is noted on the right stable from the prior exam. No acute bony abnormality is seen per  IMPRESSION: No active disease.   Electronically Signed   By: Walter Dennis M.D.   On: 09/01/2014 15:38   Dg Abd 1 View  09/05/2014   CLINICAL DATA:  Ongoing nausea, vomiting since last night. Mid abdominal discomfort.  EXAM: ABDOMEN - 1 VIEW  COMPARISON:  None.  FINDINGS: Nonobstructive bowel gas pattern. No supine evidence of free air. No organomegaly or suspicious calcification. No acute bony abnormality. Visualized lung bases clear.  IMPRESSION: No acute findings.   Electronically Signed   By: Walter Dennis M.D.   On: 09/05/2014 11:06   Ct Head Wo Contrast  09/02/2014   CLINICAL DATA:  Lethargy. Right arm drift. Somnolence and confusion  EXAM: CT HEAD WITHOUT CONTRAST  TECHNIQUE: Contiguous axial images were obtained from the base of the skull through the vertex without intravenous contrast.  COMPARISON:  None.  FINDINGS: Mild prominence of the sulci  and ventricles noted consistent with brain atrophy. Subtle low attenuation within the subcortical and periventricular white matter noted. There is no abnormal extra-axial fluid collection, intracranial hemorrhage or infarct. There is mild mucosal thickening involving the left maxillary sinus. Retention cysts versus polyps noted within both maxillary sinuses. The mastoid air cells are clear. The calvarium is intact.  IMPRESSION: 1. No acute intracranial abnormalities.   Electronically Signed   By: Kerby Moors M.D.   On: 09/02/2014 15:03   Ct Chest Wo Contrast  09/01/2014   CLINICAL DATA:  Dull generalized chest pain, shortness of breath, diaphoresis starting around 0730 hours. History of myeloma. Ongoing chemotherapy treatment.  EXAM: CT CHEST WITHOUT CONTRAST  TECHNIQUE: Multidetector CT imaging of the chest was performed following the standard protocol without IV contrast.  COMPARISON:  08/05/2014  FINDINGS: CT CHEST FINDINGS  Mediastinum/Nodes: Heart is normal size. Coronary artery calcifications in the 3 major coronary vessels. Aortic calcifications. No aneurysm. Small scattered mediastinal lymph nodes, none pathologically enlarged. No axillary or visible hilar adenopathy.  Lungs/Pleura: Lungs are clear. No focal airspace opacities or suspicious nodules. No effusions.  Chest wall: Right chest wall Port-A-Cath noted in place. The tip is in the SVC. Mild bilateral gynecomastia. Chest wall soft tissues  otherwise unremarkable.  Upper abdomen: Mild diffuse fatty infiltration of the liver. No acute findings in the visualized upper abdomen.  Musculoskeletal: No acute bony abnormality.  IMPRESSION: Coronary artery disease.  Fatty infiltration of the liver.  No acute findings.   Electronically Signed   By: Walter Dennis M.D.   On: 09/01/2014 17:29   Ct Angio Chest Pe W/cm &/or Wo Cm  09/02/2014   CLINICAL DATA:  64 year old male with sudden onset confusion, altered mental status, diaphoresis, lethargy. Multiple  myeloma, chemotherapy underway. Initial encounter.  EXAM: CT ANGIOGRAPHY CHEST WITH CONTRAST  TECHNIQUE: Multidetector CT imaging of the chest was performed using the standard protocol during bolus administration of intravenous contrast. Multiplanar CT image reconstructions and MIPs were obtained to evaluate the vascular anatomy.  CONTRAST:  129m OMNIPAQUE IOHEXOL 350 MG/ML SOLN  COMPARISON:  Noncontrast chest CT from yesterday, chest CTA 08/05/2014, and earlier  FINDINGS: Suboptimal contrast bolus timing in the pulmonary arterial tree. Increased respiratory motion artifact compared to the 08/05/2014 exam. The central pulmonary arteries remain patent. No hilar pulmonary artery filling defect identified. No lobar segmental filling defect identified. The more distal branches are not well evaluated today.  Trace bilateral pleural effusions are new since yesterday. Dependent and right middle lobe atelectasis. Major airways remain patent. No consolidation.  Cardiomegaly. No pericardial effusion. Stable mediastinal nodes. Aortic and Coronary artery calcified atherosclerosis. No thoracic aortic dissection or aneurysm. Right chest porta cath. No axillary lymphadenopathy.  Hepatic steatosis, visible spleen, and bowel in the upper abdomen are stable.  Abnormal bone mineralization throughout the visible skeleton is stable since May. No acute fracture or dislocation identified in the thorax.  Review of the MIP images confirms the above findings.  IMPRESSION: 1. Suboptimal exam with regard to distal pulmonary arteries, no acute pulmonary embolus identified. 2. Trace pleural effusions are new since yesterday. Pulmonary atelectasis. 3. No other acute findings in the chest. Diffuse myelomatous skeletal involvement.   Electronically Signed   By: HGenevie AnnM.D.   On: 09/02/2014 15:09   Mr BJeri CosWJJContrast  09/02/2014   CLINICAL DATA:  Patient has myeloma. Shortness of breath, dizziness, and diaphoresis beginning earlier today.  Fever of unknown origin.  EXAM: MRI HEAD WITHOUT AND WITH CONTRAST  TECHNIQUE: Multiplanar, multiecho pulse sequences of the brain and surrounding structures were obtained without and with intravenous contrast.  CONTRAST:  219mMULTIHANCE GADOBENATE DIMEGLUMINE 529 MG/ML IV SOLN  COMPARISON:  CT head earlier today.  FINDINGS: Equipment malfunction necessitated using an older model head coil. Furthermore patient motion is present. Image quality is reduced.  No evidence for acute infarction, hemorrhage, mass lesion, hydrocephalus, or extra-axial fluid. Mild cerebral and cerebellar atrophy. Mild subcortical and periventricular T2 and FLAIR hyperintensities, likely chronic microvascular ischemic change. Flow voids are maintained throughout the carotid, basilar, and vertebral arteries. There are no areas of chronic hemorrhage.  Post infusion, no abnormal enhancement of the brain or meninges. Chronic LEFT maxillary sinus disease.  Heterogeneity of the skull base, and visualized portions of C2 and C3 vertebrae, also patchy heterogeneity of the diploic space, without expansile lesion, could represent myeloma involvement. No concern for pathologic fracture of the upper cervical vertebrae in those segments which are visualized.  IMPRESSION: Mild atrophy and small vessel disease. No acute intracranial findings.  No abnormal postcontrast enhancement of the brain or meninges. Specifically no evidence for cerebritis or meningitis.  Heterogeneity of the skull base and upper cervical region, less so the diploic space, but concern for  bone marrow involvement with myeloma.   Electronically Signed   By: Staci Righter M.D.   On: 09/02/2014 20:11   Dg Chest Port 1 View  09/02/2014   CLINICAL DATA:  Shortness of Breath  EXAM: PORTABLE CHEST - 1 VIEW  COMPARISON:  Chest radiograph and chest CT September 01, 2014  FINDINGS: There is generalized interstitial edema. There is no airspace consolidation. Heart is borderline enlarged with  pulmonary venous hypertension. Port-A-Cath tip is in superior vena cava. No pneumothorax. No adenopathy.  IMPRESSION: Evidence of congestive heart failure.   Electronically Signed   By: Lowella Grip III M.D.   On: 09/02/2014 12:25     Medical Consultants:    None.  Anti-Infectives:    Diflucan 09/08/14---> 09/08/14  Doxycycline 09/05/14---> 09/05/14  Levaquin 09/04/14--->  Ceftazidime 09/02/14--->09/03/13  Flagyl 09/03/14---> 09/05/14  Zosyn 09/02/14---> 09/03/14  Acyclovir 09/02/14---> 09/03/14  Vancomycin 09/02/14--->  Subjective:   Walter Dennis is continuing to have some nausea.  Poor appetite. No vomiting.  No dyspnea. No cough.  Pain currently controlled.  Objective:    Filed Vitals:   09/11/14 1500 09/11/14 2141 09/12/14 0544 09/12/14 0552  BP: 130/61 134/69 146/71   Pulse: 65 56 61   Temp: 98.5 F (36.9 C) 98.7 F (37.1 C) 98.2 F (36.8 C)   TempSrc: Oral Oral Oral   Resp: 14 20 20    Height:      Weight:    94.892 kg (209 lb 3.2 oz)  SpO2: 98% 99% 98%     Intake/Output Summary (Last 24 hours) at 09/12/14 0804 Last data filed at 09/12/14 0700  Gross per 24 hour  Intake   1390 ml  Output    901 ml  Net    489 ml    Exam: Gen:  NAD Cardiovascular:  RRR, No M/R/G Respiratory:  Lungs CTAB Gastrointestinal:  Abdomen soft, NT/ND, + BS Extremities:  No C/E/C   Data Reviewed:    Labs: Basic Metabolic Panel:  Recent Labs Lab 09/08/14 0351 09/09/14 0340 09/10/14 0400 09/11/14 0505 09/12/14 0425  NA 140 139 139 139 138  K 3.3* 3.5 3.7 3.3* 3.6  CL 111 111 110 105 104  CO2 20* 21* 22 26 26   GLUCOSE 144* 121* 118* 107* 107*  BUN 21* 16 13 14 16   CREATININE 1.90* 1.74* 1.53* 1.53* 1.48*  CALCIUM 10.1 9.6 10.1 10.0 10.1  MG 2.1 1.9 1.7 1.8 1.7   GFR Estimated Creatinine Clearance: 61.9 mL/min (by C-G formula based on Cr of 1.48). Liver Function Tests:  Recent Labs Lab 09/08/14 0351 09/09/14 0340 09/10/14 0400 09/11/14 0505 09/12/14 0425    AST 18 28 24 19 20   ALT 27 27 29 25 24   ALKPHOS 107 134* 126 125 107  BILITOT 0.7 0.6 0.6 0.8 0.9  PROT 7.5 7.4 7.8 7.8 8.1  ALBUMIN 2.2* 2.0* 2.1* 2.4* 2.4*   Coagulation profile  Recent Labs Lab 09/08/14 0351 09/09/14 0340 09/10/14 0400 09/11/14 0505 09/12/14 0425  INR 1.26 1.16 1.17 1.18 1.15    CBC:  Recent Labs Lab 09/08/14 0351 09/09/14 0340 09/09/14 1750 09/10/14 0400 09/10/14 2045 09/11/14 0505 09/12/14 0425  WBC 3.7* 3.7* 3.4* 3.3*  --  3.6* 2.9*  NEUTROABS 2.0 1.9  --  1.7  --  1.8 1.4*  HGB 7.2* 6.7* 7.9* 7.7* 8.6* 8.3* 9.2*  HCT 22.1* 21.7* 24.5* 23.3* 27.0* 25.1* 27.7*  MCV 88.8 88.9 88.8 87.9  --  85.7 87.7  PLT 93* 99* 105* 100*  --  104* 113*    Microbiology Recent Results (from the past 240 hour(s))  Culture, blood (routine x 2)     Status: None   Collection Time: 09/02/14  8:10 AM  Result Value Ref Range Status   Specimen Description BLOOD RIGHT ARM  Final   Special Requests BOTTLES DRAWN AEROBIC AND ANAEROBIC 10CC  Final   Culture   Final    NO GROWTH 5 DAYS Performed at [facility]    Report Status 09/07/2014 FINAL  Final  Culture, blood (routine x 2)     Status: None (Preliminary result)   Collection Time: 09/02/14  8:15 AM  Result Value Ref Range Status   Specimen Description BLOOD RIGHT HAND  Final   Special Requests BOTTLES DRAWN AEROBIC AND ANAEROBIC 5CC  Final   Culture  Setup Time   Final    GRAM NEGATIVE RODS ANAEROBIC BOTTLE ONLY CRITICAL RESULT CALLED TO, READ BACK BY AND VERIFIED WITH: Nehemiah Settle RN @ [phone] ON [id number] BY Rhea Bleacher    Culture   Final    SALMONELLA SPECIES CRITICAL RESULT CALLED TO, READ BACK BY AND VERIFIED WITH: R JOHNSON 09/05/14 @ 0951 M VESTAL Performed at [facility]    Report Status PENDING  Incomplete   Organism ID, Bacteria SALMONELLA SPECIES  Final      Susceptibility   Salmonella species - MIC*    AMPICILLIN <=2 SENSITIVE Sensitive     LEVOFLOXACIN <=0.12 SENSITIVE  Sensitive     TRIMETH/SULFA <=20 SENSITIVE Sensitive     * SALMONELLA SPECIES  MRSA PCR Screening     Status: None   Collection Time: 09/02/14  3:35 PM  Result Value Ref Range Status   MRSA by PCR NEGATIVE NEGATIVE Final    Comment:        The GeneXpert MRSA Assay (FDA approved for NASAL specimens only), is one component of a comprehensive MRSA colonization surveillance program. It is not intended to diagnose MRSA infection nor to guide or monitor treatment for MRSA infections.   CSF culture with Stat gram stain     Status: None   Collection Time: 09/02/14  5:20 PM  Result Value Ref Range Status   Specimen Description CSF  Final   Special Requests Immunocompromised  Final   Gram Stain   Final    CYTOSPIN SMEAR NO WBC SEEN NO ORGANISMS SEEN Gram Stain Report Called to,Read Back By and Verified With: PAM Margarito Courser [id number] @ 1924 BY J SCOTTON    Culture   Final    NO GROWTH 3 DAYS Performed at [facility]    Report Status 09/06/2014 FINAL  Final  AFB culture with smear     Status: None (Preliminary result)   Collection Time: 09/02/14  5:20 PM  Result Value Ref Range Status   Specimen Description CSF  Final   Special Requests Immunocompromised  Final   Acid Fast Smear   Final    NO ACID FAST BACILLI SEEN Performed at [facility]    Culture   Final    CULTURE WILL BE EXAMINED FOR 6 WEEKS BEFORE ISSUING A FINAL REPORT Performed at [facility]    Report Status PENDING  Incomplete  Fungus Culture with Smear     Status: None (Preliminary result)   Collection Time: 09/02/14  5:20 PM  Result Value Ref Range Status   Specimen Description CSF  Final   Special Requests Immunocompromised  Final   Fungal Smear   Final  NO YEAST OR FUNGAL ELEMENTS SEEN Performed at [facility]    Culture   Final    CULTURE IN PROGRESS FOR FOUR WEEKS Performed at [facility]    Report Status PENDING  Incomplete  Gram stain     Status: None    Collection Time: 09/02/14  5:20 PM  Result Value Ref Range Status   Specimen Description CSF  Final   Special Requests NONE  Final   Gram Stain   Final    NO ORGANISMS SEEN NO WBC SEEN CYTOSPUN SMEAR Gram Stain Report Called to,Read Back By and Verified With: PAM WEST,RN [id number] @ 1924 BY J SCOTTON    Report Status 09/02/2014 FINAL  Final  Clostridium Difficile by PCR (not at [facility])     Status: None   Collection Time: 09/05/14  2:53 AM  Result Value Ref Range Status   C difficile by pcr NEGATIVE NEGATIVE Final  Cath Tip Culture     Status: None   Collection Time: 09/06/14  8:37 AM  Result Value Ref Range Status   Specimen Description CATH TIP  Final   Special Requests NONE  Final   Culture   Final    NO GROWTH 2 DAYS Performed at [facility]    Report Status 09/08/2014 FINAL  Final  Culture, blood (routine x 2)     Status: None (Preliminary result)   Collection Time: 09/07/14  3:50 AM  Result Value Ref Range Status   Specimen Description BLOOD RIGHT FOREARM  Final   Special Requests BOTTLES DRAWN AEROBIC ONLY  Final   Culture   Final    NO GROWTH 4 DAYS Performed at [facility]    Report Status PENDING  Incomplete  Culture, blood (routine x 2)     Status: None (Preliminary result)   Collection Time: 09/07/14  3:58 AM  Result Value Ref Range Status   Specimen Description BLOOD LEFT HAND  Final   Special Requests BOTTLES DRAWN AEROBIC ONLY  Final   Culture   Final    NO GROWTH 4 DAYS Performed at [facility]    Report Status PENDING  Incomplete     Medications:   . allopurinol  300 mg Oral Daily  . amiodarone  400 mg Oral BID  . amLODipine  2.5 mg Oral Daily  . aspirin  81 mg Oral Daily  . atorvastatin  80 mg Oral q1800  . carvedilol  12.5 mg Oral BID WC  . feeding supplement (ENSURE ENLIVE)  237 mL Oral BID BM  . feeding supplement (PRO-STAT 64)  30 mL Oral BID BM  . fentaNYL  50 mcg Transdermal Q72H  . heparin subcutaneous  5,000 Units  Subcutaneous 3 times per day  . levofloxacin (LEVAQUIN) IV  750 mg Intravenous Q24H  . nystatin  5 mL Oral QID  . pantoprazole  80 mg Oral Q1200  . pregabalin  75 mg Oral BID  . sodium chloride  3 mL Intravenous Q12H   Continuous Infusions: . dextrose 5 % and 0.45 % NaCl with KCl 20 mEq/L 50 mL/hr at 09/11/14 2029    Time spent: 25 minutes.   LOS: 11 days   [facility] Pager [phone]. If unable to reach me by pager, please call my cell phone at [phone].  *Please refer to [email or web address], password [email or web address] to get updated schedule on who will round on this patient, as hospitalists switch teams weekly. If 7PM-7AM, please contact night-coverage at [email or web address],  password [email or web address] for any overnight needs.  09/12/2014, 8:04 AM

## 2014-09-12 NOTE — Telephone Encounter (Signed)
Patient still admitted as of this morning.

## 2014-09-12 NOTE — Progress Notes (Signed)
IP PROGRESS NOTE  Subjective:  [name] reports feeling better in general. He has nausea this morning.  Objective: Vital signs in last 24 hours: Blood pressure 146/71, pulse 61, temperature 98.2 F (36.8 C), temperature source Oral, resp. rate 20, height 6' (1.829 m), weight 209 lb 3.2 oz (94.892 kg), SpO2 98 %.  Intake/Output from previous day: 06/30 0701 - 07/01 0700 In: 70 [P.O.:480; I.V.:1000; IV Piggyback:150] Out: 1301 [Urine:1300; Emesis/NG output:1]  Physical Exam: HEENT: No thrush  Lungs: Clear bilaterally Cardiac: Regular rate and rhythm Abdomen: No hepatosplenomegaly, nontender Extremities: No leg edema Skin: Minimal residual rash at the lower legs  Portacath site at the right upper chest is healing     Lab Results:  Recent Labs  09/11/14 0505 09/12/14 0425  WBC 3.6* 2.9*  HGB 8.3* 9.2*  HCT 25.1* 27.7*  PLT 104* 113*    BMET  Recent Labs  09/11/14 0505 09/12/14 0425  NA 139 138  K 3.3* 3.6  CL 105 104  CO2 26 26  GLUCOSE 107* 107*  BUN 14 16  CREATININE 1.53* 1.48*  CALCIUM 10.0 10.1    Studies/Results: Dg Chest 2 View  09/10/2014   CLINICAL DATA:  Sepsis with fever. Tachycardia. Altered mental status. Multiple myeloma.  EXAM: CHEST  2 VIEW  COMPARISON:  09/02/2014 CT and plain film.  FINDINGS: Removal of right-sided central line. Numerous leads and wires project over the chest. Midline trachea. Normal heart size. No pleural effusion or pneumothorax. Clear lungs. Pulmonary interstitial thickening  IMPRESSION: Resolved congestive heart failure or atypical infection. No acute findings.   Electronically Signed   By: Abigail Miyamoto M.D.   On: 09/10/2014 12:31    Medications: I have reviewed the patient's current medications.  Assessment/Plan:  1.Multiple myeloma, IgG lambda monoclonal protein  -Initial diagnosis 2008, bone marrow with a 40-50% plasma cells  -Lenalidomide 25 mg,day 1-21 and Decadron 40 mg weekly and 6387, complicated by  peripheral neuropathy  -Therapy change to bortezomib IV twice weekly plus Decadron  -High-dose chemotherapy with autologous stem cell transplantation 2009 at [facility]  -Bortezomib, cyclophosphamide, and Decadron December 2012 through February 2014 (discontinued secondary to pulmonary aspergillosis)  -January 2014 M spike rise to 1.1 g/dL from 0.1 g/dL in December 2013. Treatment started with Carlfilzomib, lenalidomide, and Decadron.  -treatment was restarted with Carlfilzomib, lenalidomide, and Decadron 08/16/2012.  -Restaging labs on 10/15/2012 consistent with improvement in the serum M. Protein.  -Continuation of Carfilzomib/Revlimid/dexamethasone.  -Continued improvement in the serum M spike and IgG level on 11/29/2012.  -Improvement in the serum M spike, slight increase in IgG on 01/03/2013.  -Improvement in the serum M spike, stable IgG, stable lambda free light chains 01/31/2013  -The IgG and serum M spike were slightly higher 02/28/2013, stable lambda light chains  -IgG, M spike, and lambda light chains slightly lower on 04/04/2013.  -Continuation of Carfilzomib/Revlimid/dexamethasone.  -IgG, M spike and lambda light chains stable 05/02/2013.  -IgG, serum M spike, and lambda free light chains stable on 07/11/2013; 08/08/2013.  -IgG, serum M spike, and lambda light chains higher 10/17/2013 after being off treatment during July 2015  - Carfilzomib/decadron continued with a cycle starting 10/17/2013  -Increase in serum M spike, light chains and IgG 02/13/2014. - Carfilzomib/ Decadron continued, Revlimid resumed. -Increased serum M spike, light chains, and IgG 06/12/2014: Carrfilzomib/Decadron/Revlimid discontinued -Bone marrow biopsy at [facility] 07/30/2014 consistent with plasma cell myeloma, 60% plasma cells  -Enrollment on a clinical trial at [facility] with bendamustine, pomalidomide, and Decadron-treatment started  08/29/2015 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 He has been unable to lower the Duragesic patch dose  4. Irritable bowel syndrome  5. Hypertension  6. "Chest pain " and dyspnea with exertion ,evaluated by cardiology, . Normal stress nuclear study 11/07/2012 and 08/07/2013.  7. history of Diarrhea. Most likely related to Revlimid, improved with Imodium 8. Febrile illness 12/05/2013 with associated diffuse bone pain-no source for infection identified  9. Pneumonia 08/05/2014, CT with changes consistent with an atypical infection, clinical improvement with Levaquin 10. Progressive anemia secondary to multiple myeloma 11. Admission 09/01/2014 with chest pain, nausea, and exertional dyspnea 12. Pancytopenia secondary to sepsis, multiple myeloma, and chemotherapy-stable 13. Altered mental status secondary to sepsis syndrome-resolved 14. Salmonella sepsis 09/02/2014, continuing Levaquin 15. Port-A-Cath removal 09/06/2014 16. Paroxysmal atrial fibrillation  Mr. Suastegui is recovering from the admission with sepsis syndrome. He states that he does not wish to resume treatment on the clinical trial with bendamustine, Pomalidomide, and Decadron. He plans to relocate to [place] within the next few months. I will contact Dr. Amalia Hailey at [facility] to discuss off protocol treatment options. I think pomalidomide/Decadron will be the most likely treatment plan.  Recommendations: 1. Hold pomalidomide 2. Complete course of antibiotics as per infectious disease consultants 3. Outpatient follow-up at the [facility] in the next one to 2 weeks 4. Please call Oncology as needed   LOS: 11 days   Janvi Ammar  09/12/2014, 8:37 AM

## 2014-09-12 NOTE — Progress Notes (Signed)
Pt declined HHPT at present time.  

## 2014-09-12 NOTE — Progress Notes (Signed)
Received report from Rn off going to assume care.Pt resting in bed voices no c/o.family at bedside.

## 2014-09-13 ENCOUNTER — Encounter (HOSPITAL_COMMUNITY): Payer: Self-pay | Admitting: Internal Medicine

## 2014-09-13 DIAGNOSIS — I248 Other forms of acute ischemic heart disease: Secondary | ICD-10-CM

## 2014-09-13 DIAGNOSIS — I5021 Acute systolic (congestive) heart failure: Secondary | ICD-10-CM

## 2014-09-13 HISTORY — DX: Other forms of acute ischemic heart disease: I24.8

## 2014-09-13 HISTORY — DX: Acute systolic (congestive) heart failure: I50.21

## 2014-09-13 LAB — CBC WITH DIFFERENTIAL/PLATELET
BASOS ABS: 0 10*3/uL (ref 0.0–0.1)
Basophils Relative: 1 % (ref 0–1)
Eosinophils Absolute: 0.3 10*3/uL (ref 0.0–0.7)
Eosinophils Relative: 10 % — ABNORMAL HIGH (ref 0–5)
HEMATOCRIT: 28.4 % — AB (ref 39.0–52.0)
Hemoglobin: 9.5 g/dL — ABNORMAL LOW (ref 13.0–17.0)
LYMPHS PCT: 24 % (ref 12–46)
Lymphs Abs: 0.7 10*3/uL (ref 0.7–4.0)
MCH: 29.1 pg (ref 26.0–34.0)
MCHC: 33.5 g/dL (ref 30.0–36.0)
MCV: 87.1 fL (ref 78.0–100.0)
MONO ABS: 0.4 10*3/uL (ref 0.1–1.0)
Monocytes Relative: 14 % — ABNORMAL HIGH (ref 3–12)
NEUTROS PCT: 51 % (ref 43–77)
Neutro Abs: 1.5 10*3/uL — ABNORMAL LOW (ref 1.7–7.7)
Platelets: 124 10*3/uL — ABNORMAL LOW (ref 150–400)
RBC: 3.26 MIL/uL — AB (ref 4.22–5.81)
RDW: 17.2 % — AB (ref 11.5–15.5)
WBC: 2.8 10*3/uL — AB (ref 4.0–10.5)

## 2014-09-13 LAB — COMPREHENSIVE METABOLIC PANEL
ALT: 21 U/L (ref 17–63)
ANION GAP: 10 (ref 5–15)
AST: 20 U/L (ref 15–41)
Albumin: 2.5 g/dL — ABNORMAL LOW (ref 3.5–5.0)
Alkaline Phosphatase: 114 U/L (ref 38–126)
BUN: 16 mg/dL (ref 6–20)
CO2: 25 mmol/L (ref 22–32)
CREATININE: 1.54 mg/dL — AB (ref 0.61–1.24)
Calcium: 10.3 mg/dL (ref 8.9–10.3)
Chloride: 102 mmol/L (ref 101–111)
GFR calc Af Amer: 54 mL/min — ABNORMAL LOW (ref >60)
GFR, EST NON AFRICAN AMERICAN: 47 mL/min — AB (ref >60)
Glucose, Bld: 97 mg/dL (ref 65–99)
Potassium: 3.4 mmol/L — ABNORMAL LOW (ref 3.5–5.1)
Sodium: 137 mmol/L (ref 135–145)
Total Bilirubin: 1 mg/dL (ref 0.3–1.2)
Total Protein: 8.4 g/dL — ABNORMAL HIGH (ref 6.5–8.1)

## 2014-09-13 LAB — PROTIME-INR
INR: 1.13 (ref 0.00–1.49)
Prothrombin Time: 14.7 seconds (ref 11.6–15.2)

## 2014-09-13 LAB — MAGNESIUM: Magnesium: 1.9 mg/dL (ref 1.7–2.4)

## 2014-09-13 MED ORDER — NYSTATIN 100000 UNIT/ML MT SUSP: 5.0000 mL | Freq: Four times a day (QID) | OROMUCOSAL | Status: DC

## 2014-09-13 MED ORDER — AMIODARONE HCL 400 MG PO TABS: 400.0000 mg | ORAL_TABLET | Freq: Every day | ORAL | Status: DC

## 2014-09-13 MED ORDER — ONDANSETRON HCL 8 MG PO TABS: 8.0000 mg | ORAL_TABLET | Freq: Two times a day (BID) | ORAL | Status: AC | PRN

## 2014-09-13 MED ORDER — ENSURE ENLIVE PO LIQD: 237.0000 mL | Freq: Two times a day (BID) | ORAL | Status: AC

## 2014-09-13 MED ORDER — CARVEDILOL 12.5 MG PO TABS: 6.2500 mg | ORAL_TABLET | Freq: Two times a day (BID) | ORAL | Status: DC

## 2014-09-13 MED ORDER — FENTANYL 50 MCG/HR TD PT72: 50.0000 ug | MEDICATED_PATCH | TRANSDERMAL | Status: AC

## 2014-09-13 MED ORDER — ISOSORB DINITRATE-HYDRALAZINE 20-37.5 MG PO TABS
0.5000 | ORAL_TABLET | Freq: Two times a day (BID) | ORAL | Status: DC
Start: 2014-09-13 — End: 2014-10-16

## 2014-09-13 MED ORDER — ATORVASTATIN CALCIUM 80 MG PO TABS: 80.0000 mg | ORAL_TABLET | Freq: Every day | ORAL | Status: DC

## 2014-09-13 MED ORDER — LEVOFLOXACIN 750 MG PO TABS: 750.0000 mg | ORAL_TABLET | Freq: Every day | ORAL | Status: DC

## 2014-09-13 NOTE — Progress Notes (Signed)
Completed D/C teaching with patient and family. Answered all questions. Pt will be D/C home with family in stable condition.  Mercy Moore, RN

## 2014-09-13 NOTE — Discharge Instructions (Signed)
Bacteremia °Bacteremia occurs when bacteria get in your blood. Normal blood does not usually have bacteria. Bacteremia is one way infections can spread from one part of the body to another. °CAUSES  °· Causes may include anything that allows bacteria to get into the body. Examples are: °¨ Catheters. °¨ Intravenous (IV) access tubes. °¨ Cuts or scrapes of the skin. °· Temporary bacteremia may occur during dental procedures, while brushing your teeth, or during a bowel movement. This rarely causes any symptoms or medical problems. °· Bacteria may also get in the bloodstream as a complication of a bacterial infection elsewhere. This includes infected wounds and bacterial infections of the: °¨ Lungs (pneumonia). °¨ Kidneys (pyelonephritis). °¨ Intestines (enteritis, colitis). °¨ Organs in the abdomen (appendicitis, cholecystitis, diverticulitis). °SYMPTOMS  °The body is usually able to clear small numbers of bacteria out of the blood quickly. Brief bacteremia usually does not cause problems.  °· Problems can occur if the bacteria start to grow in number or spread to other parts of the body. If the bacteria start growing, you may develop: °¨ Chills. °¨ Fever. °¨ Nausea. °¨ Vomiting. °¨ Sweating. °¨ Lightheadedness and low blood pressure. °¨ Pain. °· If bacteria start to grow in the linings around the brain, it is called meningitis. This can cause severe headaches, many other problems, and even death. °· If bacteria start to grow in a joint, it causes arthritis with painful joints. If bacteria start to grow in a bone, it is called osteomyelitis. °· Bacteria from the blood can also cause sores (abscesses) in many organs, such as the muscle, liver, spleen, lungs, brain, and kidneys. °DIAGNOSIS  °· This condition is diagnosed by cultures of the blood. °· Cultures may also be taken from other parts of the body that are thought to be causing the bacteremia. A small piece of tissue, fluid, or other product of the body is  sampled. The sample is then put on a growth plate to see if any bacteria grows. °· Other lab tests may be done and the results may be abnormal. °TREATMENT  °Treatment requires a stay in the hospital. You will be given antibiotic medicine through an IV access tube. °PREVENTION  °People with an increased risk of developing bacteremia or complications may be given antibiotics before certain procedures. Examples are: °· A person with a heart murmur or artificial heart valve, before having his or her teeth cleaned. °· Before having a surgical or other invasive procedure. °· Before having a bowel procedure. °Document Released: 12/12/2005 Document Revised: 05/23/2011 Document Reviewed: 09/23/2010 °[employer]® Patient Information ©2015 [employer]. This information is not intended to replace advice given to you by your health care provider. Make sure you discuss any questions you have with your health care provider. ° °

## 2014-09-13 NOTE — Discharge Summary (Signed)
Physician Discharge Summary  Rodrecus Belsky VWU:[id number] DOB: May 26, 1950 DOA: 09/01/2014  PCP: Effie Berkshire A  Admit date: 09/01/2014 Discharge date: 09/13/2014   Recommendations for Outpatient Follow-Up:   1. The patient follow-up appointment with Dr. Acie Fredrickson for cardiology follow-up. He was discharged on 400 mg of amiodarone daily. 2. Patient has follow-up scheduled with Dr. Benay Spice of oncology.   Discharge Diagnosis:   Principal Problem:    Chest pain secondary to demand ischemia and acute systolic CHF Active Problems:    Multiple myeloma    Essential hypertension    CKD (chronic kidney disease) stage 3, GFR 30-59 ml/min    GERD (gastroesophageal reflux disease)    Abdominal pain, chronic, epigastric    Neutropenic fever    Drug rash    Paroxysmal atrial fibrillation    Salmonella bacteremia    Protein-calorie malnutrition, severe    Thrush of mouth and esophagus    Physical deconditioning    Left bundle branch block    Elevated troponin I   Discharge disposition:  Home.    Discharge Condition: Improved.  Diet recommendation: Low sodium, heart healthy.    History of Present Illness:   Walter Dennis is an 64 y.o. 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 He started bendamustine and pomalidomide on 08/29/2014. He has history of HTN. Per patient, he has had fevers, chills, night sweats attributed to his MM for the last year. He will rigor and have associated SOB which typically resolves on its own. Separate from these symptoms, he has had intermittent severe exertional chest pain which radiates to both shoulders which is severe and associated with SOB, mild nausea, significant lightheadedness and diaphoresis. Over the last few weeks, he has had difficulty walking up a flight of stairs before becoming SOB. The  last few days, he cannot walk across a room before he develops chest pain. Pain is relieved by rest. He had a CT scan which demonstrated no evidence of pneumonia or PE but did demonstrated three vessel calcifications. Per oncology, his chemotherapy does not typically cause myocarditis or pericarditis or acute heart failure. Cardiology was consulted for possible unstable angina.    Hospital Course by Problem:   Principal Problem:  Chest pain rule out unstable angina / new LBBB / mildly elevated troponin - Previous catheterization 2012 was negative. - Inpatient ischemic evaluation deferred secondary to acute illness. Will follow-up with cardiology post hospitalization. - 2-D echo done 09/03/14. EF 45-50 percent with abnormal septal motion and inferobasal hypokinesis. - Continue aspirin, Coreg, Lipitor. No ACEI or ARB with AKI. - Repeat echocardiogram in 3 months per cardiology recommendations. - Follow-up scheduled with cardiology.  Active Problems:  Acute respiratory failure / acute systolic CHF - Possibly from CHF given findings of echo noted above. Currently well compensated.   Salmonella bacteremia / neutropenic fever - Continue Levaquin per ID recommendations. Will complete course 09/14/14. - Port-A-Cath removed 09/06/14 per ID recommendations.   Multiple myeloma / chemotherapy associated pancytopenia - He plans to relocate to [place] within the next few months.  - Dr. Benay Spice will contact Dr. Amalia Hailey at [facility] to discuss off protocol treatment options. - Blood counts stable at discharge.   Essential hypertension - Continue Coreg and BiDil.   CKD (chronic kidney disease) stage 3, GFR 30-59 ml/min - Baseline creatinine around 1.4-1.5. Current creatinine consistent with usual baseline values.   GERD (gastroesophageal reflux disease) -  Continue PPI therapy.   Abdominal pain, chronic, epigastric - Continue pain control efforts. Currently being managed with fentanyl  patch.   Physical deconditioning - Seen and evaluated by physical therapy. Declined home physical therapy.   Paroxysmal atrial fibrillation with RVR - Status post amiodarone load, now on oral amiodarone BID. - Amiodarone reduced to 400 mg daily at discharge 1 month, then reduce to 200 mg daily. - Continue aspirin. Not a candidate for systemic anticoagulation secondary to comorbidities.   Protein-calorie malnutrition, severe - Continue nutritional supplements.   Thrush of mouth and esophagus - Continue nystatin.   Hyponatremia  -Resolved.   Acute urinary retention,  - Foley catheter placed.   Medical Consultants:    Oncology   Discharge Exam:   Filed Vitals:   09/13/14 0539  BP: 135/65  Pulse: 61  Temp: 98 F (36.7 C)  Resp: 18   Filed Vitals:   09/12/14 0552 09/12/14 1355 09/12/14 2038 09/13/14 0539  BP:  126/58 136/54 135/65  Pulse:  56 54 61  Temp:  98.1 F (36.7 C) 98.5 F (36.9 C) 98 F (36.7 C)  TempSrc:  Oral Oral Oral  Resp:  18 19 18   Height:      Weight: 94.892 kg (209 lb 3.2 oz)   93.804 kg (206 lb 12.8 oz)  SpO2:  98% 99% 97%    Gen:  NAD Cardiovascular:  RRR, No M/R/G Respiratory: Lungs CTAB Gastrointestinal: Abdomen soft, NT/ND with normal active bowel sounds. Extremities: No C/E/C   The results of significant diagnostics from this hospitalization (including imaging, microbiology, ancillary and laboratory) are listed below for reference.     Procedures and Diagnostic Studies:   Dg Chest 2 View  09/10/2014   CLINICAL DATA:  Sepsis with fever. Tachycardia. Altered mental status. Multiple myeloma.  EXAM: CHEST  2 VIEW  COMPARISON:  09/02/2014 CT and plain film.  FINDINGS: Removal of right-sided central line. Numerous leads and wires project over the chest. Midline trachea. Normal heart size. No pleural effusion or pneumothorax. Clear lungs. Pulmonary interstitial thickening  IMPRESSION: Resolved congestive heart failure or  atypical infection. No acute findings.   Electronically Signed   By: Abigail Miyamoto M.D.   On: 09/10/2014 12:31   Dg Chest 2 View  09/01/2014   CLINICAL DATA:  Shortness of breath and chest pain for 2 days  EXAM: CHEST - 2 VIEW  COMPARISON:  08/08/2014  FINDINGS: Cardiac shadow is at the upper limits of normal in size but stable. The lungs are well aerated bilaterally. No focal infiltrate or sizable effusion is seen. A dual-lumen chest wall port is noted on the right stable from the prior exam. No acute bony abnormality is seen per  IMPRESSION: No active disease.   Electronically Signed   By: Inez Catalina M.D.   On: 09/01/2014 15:38   Dg Abd 1 View  09/05/2014   CLINICAL DATA:  Ongoing nausea, vomiting since last night. Mid abdominal discomfort.  EXAM: ABDOMEN - 1 VIEW  COMPARISON:  None.  FINDINGS: Nonobstructive bowel gas pattern. No supine evidence of free air. No organomegaly or suspicious calcification. No acute bony abnormality. Visualized lung bases clear.  IMPRESSION: No acute findings.   Electronically Signed   By: Rolm Baptise M.D.   On: 09/05/2014 11:06   Ct Head Wo Contrast  09/02/2014   CLINICAL DATA:  Lethargy. Right arm drift. Somnolence and confusion  EXAM: CT HEAD WITHOUT CONTRAST  TECHNIQUE: Contiguous axial images were obtained from the base  of the skull through the vertex without intravenous contrast.  COMPARISON:  None.  FINDINGS: Mild prominence of the sulci and ventricles noted consistent with brain atrophy. Subtle low attenuation within the subcortical and periventricular white matter noted. There is no abnormal extra-axial fluid collection, intracranial hemorrhage or infarct. There is mild mucosal thickening involving the left maxillary sinus. Retention cysts versus polyps noted within both maxillary sinuses. The mastoid air cells are clear. The calvarium is intact.  IMPRESSION: 1. No acute intracranial abnormalities.   Electronically Signed   By: Kerby Moors M.D.   On: 09/02/2014  15:03   Ct Chest Wo Contrast  09/01/2014   CLINICAL DATA:  Dull generalized chest pain, shortness of breath, diaphoresis starting around 0730 hours. History of myeloma. Ongoing chemotherapy treatment.  EXAM: CT CHEST WITHOUT CONTRAST  TECHNIQUE: Multidetector CT imaging of the chest was performed following the standard protocol without IV contrast.  COMPARISON:  08/05/2014  FINDINGS: CT CHEST FINDINGS  Mediastinum/Nodes: Heart is normal size. Coronary artery calcifications in the 3 major coronary vessels. Aortic calcifications. No aneurysm. Small scattered mediastinal lymph nodes, none pathologically enlarged. No axillary or visible hilar adenopathy.  Lungs/Pleura: Lungs are clear. No focal airspace opacities or suspicious nodules. No effusions.  Chest wall: Right chest wall Port-A-Cath noted in place. The tip is in the SVC. Mild bilateral gynecomastia. Chest wall soft tissues otherwise unremarkable.  Upper abdomen: Mild diffuse fatty infiltration of the liver. No acute findings in the visualized upper abdomen.  Musculoskeletal: No acute bony abnormality.  IMPRESSION: Coronary artery disease.  Fatty infiltration of the liver.  No acute findings.   Electronically Signed   By: Rolm Baptise M.D.   On: 09/01/2014 17:29   Ct Angio Chest Pe W/cm &/or Wo Cm  09/02/2014   CLINICAL DATA:  64 year old male with sudden onset confusion, altered mental status, diaphoresis, lethargy. Multiple myeloma, chemotherapy underway. Initial encounter.  EXAM: CT ANGIOGRAPHY CHEST WITH CONTRAST  TECHNIQUE: Multidetector CT imaging of the chest was performed using the standard protocol during bolus administration of intravenous contrast. Multiplanar CT image reconstructions and MIPs were obtained to evaluate the vascular anatomy.  CONTRAST:  148m OMNIPAQUE IOHEXOL 350 MG/ML SOLN  COMPARISON:  Noncontrast chest CT from yesterday, chest CTA 08/05/2014, and earlier  FINDINGS: Suboptimal contrast bolus timing in the pulmonary arterial  tree. Increased respiratory motion artifact compared to the 08/05/2014 exam. The central pulmonary arteries remain patent. No hilar pulmonary artery filling defect identified. No lobar segmental filling defect identified. The more distal branches are not well evaluated today.  Trace bilateral pleural effusions are new since yesterday. Dependent and right middle lobe atelectasis. Major airways remain patent. No consolidation.  Cardiomegaly. No pericardial effusion. Stable mediastinal nodes. Aortic and Coronary artery calcified atherosclerosis. No thoracic aortic dissection or aneurysm. Right chest porta cath. No axillary lymphadenopathy.  Hepatic steatosis, visible spleen, and bowel in the upper abdomen are stable.  Abnormal bone mineralization throughout the visible skeleton is stable since May. No acute fracture or dislocation identified in the thorax.  Review of the MIP images confirms the above findings.  IMPRESSION: 1. Suboptimal exam with regard to distal pulmonary arteries, no acute pulmonary embolus identified. 2. Trace pleural effusions are new since yesterday. Pulmonary atelectasis. 3. No other acute findings in the chest. Diffuse myelomatous skeletal involvement.   Electronically Signed   By: HGenevie AnnM.D.   On: 09/02/2014 15:09   Mr BJeri CosWASContrast  09/02/2014   CLINICAL DATA:  Patient has myeloma.  Shortness of breath, dizziness, and diaphoresis beginning earlier today. Fever of unknown origin.  EXAM: MRI HEAD WITHOUT AND WITH CONTRAST  TECHNIQUE: Multiplanar, multiecho pulse sequences of the brain and surrounding structures were obtained without and with intravenous contrast.  CONTRAST:  56m MULTIHANCE GADOBENATE DIMEGLUMINE 529 MG/ML IV SOLN  COMPARISON:  CT head earlier today.  FINDINGS: Equipment malfunction necessitated using an older model head coil. Furthermore patient motion is present. Image quality is reduced.  No evidence for acute infarction, hemorrhage, mass lesion, hydrocephalus, or  extra-axial fluid. Mild cerebral and cerebellar atrophy. Mild subcortical and periventricular T2 and FLAIR hyperintensities, likely chronic microvascular ischemic change. Flow voids are maintained throughout the carotid, basilar, and vertebral arteries. There are no areas of chronic hemorrhage.  Post infusion, no abnormal enhancement of the brain or meninges. Chronic LEFT maxillary sinus disease.  Heterogeneity of the skull base, and visualized portions of C2 and C3 vertebrae, also patchy heterogeneity of the diploic space, without expansile lesion, could represent myeloma involvement. No concern for pathologic fracture of the upper cervical vertebrae in those segments which are visualized.  IMPRESSION: Mild atrophy and small vessel disease. No acute intracranial findings.  No abnormal postcontrast enhancement of the brain or meninges. Specifically no evidence for cerebritis or meningitis.  Heterogeneity of the skull base and upper cervical region, less so the diploic space, but concern for bone marrow involvement with myeloma.   Electronically Signed   By: JStaci RighterM.D.   On: 09/02/2014 20:11   Dg Chest Port 1 View  09/02/2014   CLINICAL DATA:  Shortness of Breath  EXAM: PORTABLE CHEST - 1 VIEW  COMPARISON:  Chest radiograph and chest CT September 01, 2014  FINDINGS: There is generalized interstitial edema. There is no airspace consolidation. Heart is borderline enlarged with pulmonary venous hypertension. Port-A-Cath tip is in superior vena cava. No pneumothorax. No adenopathy.  IMPRESSION: Evidence of congestive heart failure.   Electronically Signed   By: WLowella GripIII M.D.   On: 09/02/2014 12:25     Labs:   Basic Metabolic Panel:  Recent Labs Lab 09/09/14 0340 09/10/14 0400 09/11/14 0505 09/12/14 0425 09/13/14 0531  NA 139 139 139 138 137  K 3.5 3.7 3.3* 3.6 3.4*  CL 111 110 105 104 102  CO2 21* 22 26 26 25   GLUCOSE 121* 118* 107* 107* 97  BUN 16 13 14 16 16   CREATININE 1.74*  1.53* 1.53* 1.48* 1.54*  CALCIUM 9.6 10.1 10.0 10.1 10.3  MG 1.9 1.7 1.8 1.7 1.9   GFR Estimated Creatinine Clearance: 59.2 mL/min (by C-G formula based on Cr of 1.54). Liver Function Tests:  Recent Labs Lab 09/09/14 0340 09/10/14 0400 09/11/14 0505 09/12/14 0425 09/13/14 0531  AST 28 24 19 20 20   ALT 27 29 25 24 21   ALKPHOS 134* 126 125 107 114  BILITOT 0.6 0.6 0.8 0.9 1.0  PROT 7.4 7.8 7.8 8.1 8.4*  ALBUMIN 2.0* 2.1* 2.4* 2.4* 2.5*   Coagulation profile  Recent Labs Lab 09/09/14 0340 09/10/14 0400 09/11/14 0505 09/12/14 0425 09/13/14 0531  INR 1.16 1.17 1.18 1.15 1.13    CBC:  Recent Labs Lab 09/09/14 0340 09/09/14 1750 09/10/14 0400 09/10/14 2045 09/11/14 0505 09/12/14 0425 09/13/14 0531  WBC 3.7* 3.4* 3.3*  --  3.6* 2.9* 2.8*  NEUTROABS 1.9  --  1.7  --  1.8 1.4* 1.5*  HGB 6.7* 7.9* 7.7* 8.6* 8.3* 9.2* 9.5*  HCT 21.7* 24.5* 23.3* 27.0* 25.1* 27.7* 28.4*  MCV  88.9 88.8 87.9  --  85.7 87.7 87.1  PLT 99* 105* 100*  --  104* 113* 124*   Microbiology Recent Results (from the past 240 hour(s))  Clostridium Difficile by PCR (not at [facility])     Status: None   Collection Time: 09/05/14  2:53 AM  Result Value Ref Range Status   C difficile by pcr NEGATIVE NEGATIVE Final  Cath Tip Culture     Status: None   Collection Time: 09/06/14  8:37 AM  Result Value Ref Range Status   Specimen Description CATH TIP  Final   Special Requests NONE  Final   Culture   Final    NO GROWTH 2 DAYS Performed at [facility]    Report Status 09/08/2014 FINAL  Final  Culture, blood (routine x 2)     Status: None   Collection Time: 09/07/14  3:50 AM  Result Value Ref Range Status   Specimen Description BLOOD RIGHT FOREARM  Final   Special Requests BOTTLES DRAWN AEROBIC ONLY  Final   Culture   Final    NO GROWTH 5 DAYS Performed at [facility]    Report Status 09/12/2014 FINAL  Final  Culture, blood (routine x 2)     Status: None   Collection Time:  09/07/14  3:58 AM  Result Value Ref Range Status   Specimen Description BLOOD LEFT HAND  Final   Special Requests BOTTLES DRAWN AEROBIC ONLY  Final   Culture   Final    NO GROWTH 5 DAYS Performed at [facility]    Report Status 09/12/2014 FINAL  Final     Discharge Instructions:   Discharge Instructions    Call MD for:  extreme fatigue    Complete by:  As directed      Call MD for:  persistant nausea and vomiting    Complete by:  As directed      Call MD for:  redness, tenderness, or signs of infection (pain, swelling, redness, odor or green/yellow discharge around incision site)    Complete by:  As directed      Call MD for:  severe uncontrolled pain    Complete by:  As directed      Call MD for:  temperature >100.4    Complete by:  As directed      Diet - low sodium heart healthy    Complete by:  As directed      Discharge instructions    Complete by:  As directed   You were cared for by Dr. Jacquelynn Cree  (a hospitalist) during your hospital stay. If you have any questions about your discharge medications or the care you received while you were in the hospital after you are discharged, you can call the unit and ask to speak with the hospitalist on call if the hospitalist that took care of you is not available. Once you are discharged, your primary care physician will handle any further medical issues. Please note that NO REFILLS for any discharge medications will be authorized once you are discharged, as it is imperative that you return to your primary care physician (or establish a relationship with a primary care physician if you do not have one) for your aftercare needs so that they can reassess your need for medications and monitor your lab values.  Any outstanding tests can be reviewed by your PCP at your follow up visit.  It is also important to review any medicine changes with your  PCP.  Please bring these d/c instructions with you to your next visit so your physician  can review these changes with you.  If you do not have a primary care physician, you can call [phone] for a physician referral.  It is highly recommended that you obtain a PCP for hospital follow up.     Increase activity slowly    Complete by:  As directed             Medication List    STOP taking these medications        dexamethasone 4 MG tablet  Commonly known as:  DECADRON     fentaNYL 25 MCG/HR patch  Commonly known as:  DURAGESIC - dosed mcg/hr  Replaced by:  fentaNYL 50 MCG/HR     furosemide 40 MG tablet  Commonly known as:  LASIX     PRESCRIPTION MEDICATION     valsartan 160 MG tablet  Commonly known as:  DIOVAN      TAKE these medications        acetaminophen 325 MG tablet  Commonly known as:  TYLENOL  Take 2 tablets by mouth every 4 (four) hours as needed. Pain/fever     acyclovir 400 MG tablet  Commonly known as:  ZOVIRAX  Take 400 mg by mouth every 12 (twelve) hours. Take 1 tablet by mouth every 12 hours to prevent shingles while on chemo     allopurinol 300 MG tablet  Commonly known as:  ZYLOPRIM  Take 1 tablet by mouth daily.     amiodarone 400 MG tablet  Commonly known as:  PACERONE  Take 1 tablet (400 mg total) by mouth daily.     aspirin 81 MG chewable tablet  Chew 81 mg by mouth daily.     atorvastatin 80 MG tablet  Commonly known as:  LIPITOR  Take 1 tablet (80 mg total) by mouth daily at 6 PM.     carvedilol 12.5 MG tablet  Commonly known as:  COREG  Take 0.5 tablets (6.25 mg total) by mouth 2 (two) times daily with a meal.     diphenoxylate-atropine 2.5-0.025 MG per tablet  Commonly known as:  LOMOTIL  Take 1 tablet by mouth 4 (four) times daily as needed for diarrhea or loose stools.     esomeprazole 40 MG capsule  Commonly known as:  NEXIUM  Take 40 mg by mouth daily at 12 noon.     feeding supplement (ENSURE ENLIVE) Liqd  Take 237 mLs by mouth 2 (two) times daily between meals.     fentaNYL 50 MCG/HR  Commonly known as:   DURAGESIC - dosed mcg/hr  Place 1 patch (50 mcg total) onto the skin every 3 (three) days.     isosorbide-hydrALAZINE 20-37.5 MG per tablet  Commonly known as:  BIDIL  Take 0.5 tablets by mouth 2 (two) times daily.     levofloxacin 750 MG tablet  Commonly known as:  LEVAQUIN  Take 1 tablet (750 mg total) by mouth daily.     nitroGLYCERIN 0.3 MG SL tablet  Commonly known as:  NITROSTAT  Place 1 tablet (0.3 mg total) under the tongue every 5 (five) minutes as needed for chest pain. No more than 3 doses in period of 15 minutes per episode.     nystatin 100000 UNIT/ML suspension  Commonly known as:  MYCOSTATIN  Take 5 mLs (500,000 Units total) by mouth 4 (four) times daily.     ondansetron 8 MG tablet  Commonly  known as:  ZOFRAN  Take 1 tablet (8 mg total) by mouth every 12 (twelve) hours as needed for nausea.     oxyCODONE 5 MG immediate release tablet  Commonly known as:  Oxy IR/ROXICODONE  Take 1-2 tablets by mouth every 4 (four) hours as needed for breakthrough pain. pain     potassium chloride 10 MEQ tablet  Commonly known as:  K-DUR  Take 1 tablet (10 mEq total) by mouth daily.     pregabalin 75 MG capsule  Commonly known as:  LYRICA  Take 1 capsule (75 mg total) by mouth 2 (two) times daily.     PROAIR HFA 108 (90 BASE) MCG/ACT inhaler  Generic drug:  albuterol  Inhale 1-2 puffs into the lungs every 6 (six) hours as needed for wheezing or shortness of breath.           Follow-up Information    Follow up with Nahser, Wonda Cheng, MD On 09/18/2014.   Specialty:  Cardiology   Why:  See provider at 11:15 am, please arrive 15 minutes early for paperwork.   Contact information:   [address] [place]  [address] [phone]       Follow up with Betsy Coder, MD.   Specialty:  Oncology   Contact information:   [place] [address] [phone]       Please follow up.   Why:  At your scheduled appt time noted below.        Time coordinating discharge: 35 minutes.  Signed:  RAMA,CHRISTINA  Pager [phone] [facility] 09/13/2014, 3:36 PM

## 2014-09-16 NOTE — Telephone Encounter (Signed)
Patient contacted regarding discharge from [facility] on 09/13/14.  Patient understands to follow up with provider Dr. Acie Fredrickson on 09/18/14 at 26 at [facility] location. Patient understands discharge instructions? yes Patient understands medications and regiment? yes Patient understands to bring all medications to this visit? yes  Pt has no further concerns or complaints.

## 2014-09-17 LAB — HISTOPLASMA ANTIGEN (BLD, CSF, BRONCH WASH, OTHER)
Histoplasma Antigen (HISTAG): NOT DETECTED ng/mL
INTERPRETATION-HISTAG: NEGATIVE

## 2014-09-18 ENCOUNTER — Ambulatory Visit (INDEPENDENT_AMBULATORY_CARE_PROVIDER_SITE_OTHER): Payer: BLUE CROSS/BLUE SHIELD | Admitting: Cardiovascular Disease

## 2014-09-18 ENCOUNTER — Encounter: Payer: Self-pay | Admitting: Cardiovascular Disease

## 2014-09-18 VITALS — BP 120/54 | HR 63 | Ht 72.0 in | Wt 210.2 lb

## 2014-09-18 DIAGNOSIS — I5021 Acute systolic (congestive) heart failure: Secondary | ICD-10-CM

## 2014-09-18 DIAGNOSIS — I480 Paroxysmal atrial fibrillation: Secondary | ICD-10-CM

## 2014-09-18 DIAGNOSIS — I4891 Unspecified atrial fibrillation: Secondary | ICD-10-CM | POA: Insufficient documentation

## 2014-09-18 MED ORDER — AMIODARONE HCL 200 MG PO TABS: 200.0000 mg | ORAL_TABLET | Freq: Every day | ORAL | Status: AC

## 2014-09-18 MED ORDER — CARVEDILOL 6.25 MG PO TABS: 6.2500 mg | ORAL_TABLET | Freq: Two times a day (BID) | ORAL | Status: AC

## 2014-09-18 NOTE — Patient Instructions (Signed)
Medication Instructions:  DECREASE Amiodarone to 200 mg daily  Labwork: None Ordered   Testing/Procedures: None Ordered   Follow-Up: Your physician wants you to follow-up in: 6 months with Dr. Acie Fredrickson.  You will receive a reminder letter in the mail two months in advance. If you don't receive a letter, please call our office to schedule the follow-up appointment.

## 2014-09-18 NOTE — Progress Notes (Signed)
Walter Dennis Date of Birth  1950-03-16       [facility] Office [address], [place], [place]  [address]   [place], [place]  [address] [place]   Fax  [phone]     Fax [phone]  Problem List: 1. Multiple myeloma 2. Chest pain-negative stress Myoview study in July, 2014 3. Paroxysmal atrial fib 4. Salmonella Bacteremia   History of Present Illness:   [name] has been seen in the past for exertional CP ( in July 2014).  His myoview was negative.    He now presents with the same pain chest burning with exertion, associated with dyspnea.  He has to stop and rest to catch his breath.    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   He had a heart cath ~ 2012 which was normal.   Dec. 1, 2015:  [name] is seen as a work in visit for HTN BP is typically high in the AM , goes down through the day and then back up in the evening.    Avoiding salt.  Walks quite a bit through the day.    September 18, 2014:  [name] was admitted to the hospital last week for alterned mental status and fever.  Was found eventually to have salmondella baceremia   shortness of breath and some chest pain . He was thought to have chest discomfort due to worsening congestive heart failure. As paroxysmal atrial fibrillation. His amiodarone was increased to 400 mg a day.     Current Outpatient Prescriptions on File Prior to Visit  Medication Sig Dispense Refill  . acetaminophen (TYLENOL) 325 MG tablet Take 2 tablets by mouth every 4 (four) hours as needed. Pain/fever    . acyclovir (ZOVIRAX) 400 MG tablet Take 400 mg by mouth every 12 (twelve) hours. Take 1 tablet by mouth every 12 hours to prevent shingles while on chemo    . allopurinol (ZYLOPRIM) 300 MG tablet Take 1 tablet by mouth daily.    Marland Kitchen amiodarone (PACERONE) 400 MG tablet Take 1 tablet (400 mg total) by mouth daily. 30 tablet 0  . aspirin 81 MG chewable tablet Chew 81  mg by mouth daily.    Marland Kitchen atorvastatin (LIPITOR) 80 MG tablet Take 1 tablet (80 mg total) by mouth daily at 6 PM. 30 tablet 0  . carvedilol (COREG) 12.5 MG tablet Take 0.5 tablets (6.25 mg total) by mouth 2 (two) times daily with a meal. 60 tablet 0  . diphenoxylate-atropine (LOMOTIL) 2.5-0.025 MG per tablet Take 1 tablet by mouth 4 (four) times daily as needed for diarrhea or loose stools.    Marland Kitchen esomeprazole (NEXIUM) 40 MG capsule Take 40 mg by mouth daily at 12 noon.    . feeding supplement, ENSURE ENLIVE, (ENSURE ENLIVE) LIQD Take 237 mLs by mouth 2 (two) times daily between meals. 237 mL 12  . fentaNYL (DURAGESIC - DOSED MCG/HR) 50 MCG/HR Place 1 patch (50 mcg total) onto the skin every 3 (three) days. 5 patch 0  . isosorbide-hydrALAZINE (BIDIL) 20-37.5 MG per tablet Take 0.5 tablets by mouth 2 (two) times daily. 30 tablet 0  . levofloxacin (LEVAQUIN) 750 MG tablet Take 1 tablet (750 mg total) by mouth daily. 1 tablet 0  . nitroGLYCERIN (NITROSTAT) 0.3 MG SL tablet Place 1 tablet (0.3 mg total) under the tongue every 5 (five) minutes as needed for chest  pain. No more than 3 doses in period of 15 minutes per episode. 90 tablet 1  . nystatin (MYCOSTATIN) 100000 UNIT/ML suspension Take 5 mLs (500,000 Units total) by mouth 4 (four) times daily. 60 mL 0  . ondansetron (ZOFRAN) 8 MG tablet Take 1 tablet (8 mg total) by mouth every 12 (twelve) hours as needed for nausea. 30 tablet 1  . oxyCODONE (OXY IR/ROXICODONE) 5 MG immediate release tablet Take 1-2 tablets by mouth every 4 (four) hours as needed for breakthrough pain. pain    . potassium chloride (K-DUR) 10 MEQ tablet Take 1 tablet (10 mEq total) by mouth daily. 90 tablet 3  . pregabalin (LYRICA) 75 MG capsule Take 1 capsule (75 mg total) by mouth 2 (two) times daily. 60 capsule 3  . PROAIR HFA 108 (90 BASE) MCG/ACT inhaler Inhale 1-2 puffs into the lungs every 6 (six) hours as needed for wheezing or shortness of breath.   0   No current  facility-administered medications on file prior to visit.    Allergies  Allergen Reactions  . Ceftazidime Rash    Petechial rash   . Penicillins Hives  . Zosyn [Piperacillin Sod-Tazobactam So] Hives    Past Medical History  Diagnosis Date  . Hypertension   . Aspergillus pneumonia   . 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 Demand ischemia 09/13/2014  . Acute systolic CHF (congestive heart failure) 09/13/2014  . Thrush of mouth and esophagus   . Protein-calorie malnutrition, severe   . Salmonella bacteremia   . Paroxysmal atrial fibrillation   . Disorder of peripheral nervous system 07/23/2012  . Chronic diarrhea 07/30/2014  . Neutropenic fever   . CKD (chronic kidney disease) stage 3, GFR 30-59 ml/min 09/01/2014  . Essential hypertension 02/11/2014  . Multiple myeloma 08/15/2012    Past Surgical History  Procedure Laterality Date  . Portacath placement      right chest  . Cardiac catheterization      2012 - nml in [place]  . Port-a-cath removal Right 09/06/2014    Procedure: REMOVAL INFECTED PORT-A-CATH;  Surgeon: Coralie Keens, MD;  Location: [facility] ORS;  Service: General;  Laterality: Right;    History  Smoking status  . Former Smoker  . Quit date: 09/29/2002  Smokeless tobacco  . Never Used    History  Alcohol Use No    Family History  Problem Relation Age of Onset  . Hypertension Mother   . Hypertension Father   . Hypertension Sister   . Hypertension Brother   . Heart disease Father     Reviw of Systems:  Reviewed in the HPI.  All other systems are negative.  Physical Exam: Blood pressure 120/54, pulse 63, height 6' (1.829 m), weight 95.346 kg (210 lb 3.2 oz), SpO2 97 %. Wt Readings from Last 3 Encounters:  09/18/14 95.346 kg (210 lb 3.2 oz)  09/13/14 93.804 kg (206 lb 12.8 oz)  09/02/14 99.791 kg (220 lb)     General: Well developed, well nourished, in no acute distress.  Head: Normocephalic, atraumatic, sclera non-icteric, mucus membranes are moist,    Neck: Supple. Carotids are 2 + without bruits. No JVD   Lungs: Clear   Heart: RR, normal s1s2  Abdomen: Soft, non-tender, non-distended with normal bowel sounds.  Msk:  Strength and tone are normal   Extremities: No clubbing or cyanosis. No edema.  Distal pedal pulses are 2+ and equal    Neuro: CN II - XII intact.  Alert and oriented X 3.  Psych:  Normal   ECG: Jul 22, 2013:  Sinus tach at 102.  Otherwise normal  Assessment and Plan:  1. Multiple myeloma - plans per Onc.  2. Chest pain-negative stress Myoview study in July, 2014 - minimal Troponin elevation during episode of salmonella bacteremia.  No further evaluation needed for this   3. Paroxysmal atrial fib - clinically is back in NSR.  Will reduce the dose of amiodarone to 200 mg a day.  I think that we can probably DC the amiodarone in 6 months   4. Salmonella Bacteremia     Nahser, Wonda Cheng, MD  09/18/2014 12:38 PM    [address] [place],  [place], [place]  [address] Pager [phone] Phone: [phone]; Fax: [phone]   [facility]  [address] [place], [place]  [address] [phone]    Fax [phone]

## 2014-09-22 LAB — CULTURE, BLOOD (ROUTINE X 2)

## 2014-09-28 ENCOUNTER — Other Ambulatory Visit: Payer: Self-pay | Admitting: Oncology

## 2014-09-30 ENCOUNTER — Ambulatory Visit (HOSPITAL_COMMUNITY)
Admission: RE | Admit: 2014-09-30 | Discharge: 2014-09-30 | Disposition: A | Discharge status: Home / Self Care | Payer: BLUE CROSS/BLUE SHIELD | Source: Ambulatory Visit | Attending: Oncology | Admitting: Oncology

## 2014-09-30 ENCOUNTER — Ambulatory Visit (HOSPITAL_BASED_OUTPATIENT_CLINIC_OR_DEPARTMENT_OTHER): Payer: BLUE CROSS/BLUE SHIELD | Admitting: Oncology

## 2014-09-30 ENCOUNTER — Ambulatory Visit (HOSPITAL_BASED_OUTPATIENT_CLINIC_OR_DEPARTMENT_OTHER): Payer: BLUE CROSS/BLUE SHIELD

## 2014-09-30 ENCOUNTER — Telehealth: Payer: Self-pay | Admitting: Oncology

## 2014-09-30 VITALS — BP 149/58 | HR 76 | Temp 98.4°F | Resp 18 | Ht 72.0 in | Wt 215.4 lb

## 2014-09-30 DIAGNOSIS — C9000 Multiple myeloma not having achieved remission: Secondary | ICD-10-CM

## 2014-09-30 LAB — FUNGUS CULTURE W SMEAR: Fungal Smear: NONE SEEN

## 2014-09-30 LAB — CBC WITH DIFFERENTIAL/PLATELET
BASO%: 2.2 % — ABNORMAL HIGH (ref 0.0–2.0)
Basophils Absolute: 0.1 10*3/uL (ref 0.0–0.1)
EOS%: 11.2 % — ABNORMAL HIGH (ref 0.0–7.0)
Eosinophils Absolute: 0.4 10*3/uL (ref 0.0–0.5)
HCT: 21.7 % — ABNORMAL LOW (ref 38.4–49.9)
HEMOGLOBIN: 7 g/dL — AB (ref 13.0–17.1)
LYMPH%: 19.8 % (ref 14.0–49.0)
MCH: 28.5 pg (ref 27.2–33.4)
MCHC: 32.3 g/dL (ref 32.0–36.0)
MCV: 88.2 fL (ref 79.3–98.0)
MONO#: 0.3 10*3/uL (ref 0.1–0.9)
MONO%: 10.9 % (ref 0.0–14.0)
NEUT#: 1.8 10*3/uL (ref 1.5–6.5)
NEUT%: 55.9 % (ref 39.0–75.0)
Platelets: 78 10*3/uL — ABNORMAL LOW (ref 140–400)
RBC: 2.46 10*6/uL — ABNORMAL LOW (ref 4.20–5.82)
RDW: 18 % — AB (ref 11.0–14.6)
WBC: 3.1 10*3/uL — ABNORMAL LOW (ref 4.0–10.3)
lymph#: 0.6 10*3/uL — ABNORMAL LOW (ref 0.9–3.3)
nRBC: 7 % — ABNORMAL HIGH (ref 0–0)

## 2014-09-30 LAB — PREPARE RBC (CROSSMATCH)

## 2014-09-30 NOTE — Telephone Encounter (Signed)
Sent msg to add 2 units of RBC's per 07/19 POF will contact pt with time

## 2014-09-30 NOTE — Progress Notes (Signed)
[place] OFFICE PROGRESS NOTE   Diagnosis: Multiple myeloma  INTERVAL HISTORY:   Walter Dennis returns as scheduled. He saw Dr. Amalia Hailey last week and will return on 10/02/2014. No fever or diarrhea. The Port-A-Cath site is healing. No chest pain. He complains of malaise and exertional dyspnea. Walter Dennis plans to move to [facility] next week. He was discharged from the hospital 09/13/2014 after an admission with Salmonella sepsis. He has completed antibiotics therapy.  Objective:  Vital signs in last 24 hours:  Blood pressure 149/58, pulse 76, temperature 98.4 F (36.9 C), temperature source Oral, resp. rate 18, height 6' (1.829 m), weight 215 lb 6.4 oz (97.705 kg), SpO2 98 %.    HEENT: No thrush Resp: Lungs clear bilaterally Cardio: Regular rate and rhythm GI: No hepatosplenomegaly, nontender Vascular: Trace pitting edema at the left greater than right ankle  Skin: Port-A-Cath site at the right upper chest has healed  Lab Results:  Lab Results  Component Value Date   WBC 3.1* 09/30/2014   HGB 7.0* 09/30/2014   HCT 21.7* 09/30/2014   MCV 88.2 09/30/2014   PLT 78* 09/30/2014   NEUTROABS 1.8 09/30/2014     Medications: I have reviewed the patient's current medications.  Assessment/Plan: 1.Multiple myeloma, IgG lambda monoclonal protein  -Initial diagnosis 2008, bone marrow with a 40-50% plasma cells  -Lenalidomide 25 mg,day 1-21 and Decadron 40 mg weekly and 4132, complicated by peripheral neuropathy  -Therapy change to bortezomib IV twice weekly plus Decadron  -High-dose chemotherapy with autologous stem cell transplantation 2009 at [facility]  -Bortezomib, cyclophosphamide, and Decadron December 2012 through February 2014 (discontinued secondary to pulmonary aspergillosis)  -January 2014 M spike rise to 1.1 g/dL from 0.1 g/dL in December 2013. Treatment started with Carlfilzomib, lenalidomide, and Decadron.  -treatment was restarted with Carlfilzomib,  lenalidomide, and Decadron 08/16/2012.  -Restaging labs on 10/15/2012 consistent with improvement in the serum M. Protein.  -Continuation of Carfilzomib/Revlimid/dexamethasone.  -Continued improvement in the serum M spike and IgG level on 11/29/2012.  -Improvement in the serum M spike, slight increase in IgG on 01/03/2013.  -Improvement in the serum M spike, stable IgG, stable lambda free light chains 01/31/2013  -The IgG and serum M spike were slightly higher 02/28/2013, stable lambda light chains  -IgG, M spike, and lambda light chains slightly lower on 04/04/2013.  -Continuation of Carfilzomib/Revlimid/dexamethasone.  -IgG, M spike and lambda light chains stable 05/02/2013.  -IgG, serum M spike, and lambda free light chains stable on 07/11/2013; 08/08/2013.  -IgG, serum M spike, and lambda light chains higher 10/17/2013 after being off treatment during July 2015  - Carfilzomib/decadron continued with a cycle starting 10/17/2013  -Increase in serum M spike, light chains and IgG 02/13/2014. - Carfilzomib/ Decadron continued, Revlimid resumed. -Increased serum M spike, light chains, and IgG 06/12/2014: Carrfilzomib/Decadron/Revlimid discontinued -Bone marrow biopsy at [facility] 07/30/2014 consistent with plasma cell myeloma, 60% plasma cells  -Enrollment on a clinical trial at [facility] with bendamustine, pomalidomide, and Decadron-treatment started 08/29/2015 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 He has been unable to lower the Duragesic patch dose  4. Irritable bowel syndrome  5. Hypertension  6. "Chest pain " and dyspnea with exertion ,evaluated by cardiology, . Normal stress nuclear study 11/07/2012 and 08/07/2013.  7. history of Diarrhea. Most likely related to Revlimid, improved with Imodium 8. Febrile illness 12/05/2013 with associated diffuse bone pain-no source for infection identified  9. Pneumonia 08/05/2014,  CT with changes consistent with  an atypical infection, clinical improvement with Levaquin 10. Progressive anemia secondary to multiple myeloma 11. Admission 09/01/2014 with chest pain, nausea, and exertional dyspnea 12. Pancytopenia secondary to sepsis, multiple myeloma, and chemotherapy 13. Altered mental status secondary to sepsis syndrome-resolved 14. Salmonella sepsis 09/02/2014 15. Port-A-Cath removal 09/06/2014 16. Paroxysmal atrial fibrillation    Disposition:  Walter Dennis has symptomatic anemia. The anemia is secondary to the recent admission with Salmonella sepsis, chemotherapy, and progressive multiple myeloma.  He will see Dr. Amalia Hailey later this week to make a treatment plan for the myeloma. He will be transfused with 2 units of packed red blood cells on 10/01/2014.  Walter Dennis plans to relocate to [facility] within the next one week. He will establish care at [facility]. I will be glad to see him in the future as needed. He qualifies for complete medical disability.  Betsy Coder, MD  09/30/2014  4:46 PM

## 2014-10-01 ENCOUNTER — Telehealth: Payer: Self-pay | Admitting: Oncology

## 2014-10-01 ENCOUNTER — Telehealth: Payer: Self-pay | Admitting: *Deleted

## 2014-10-01 ENCOUNTER — Ambulatory Visit (HOSPITAL_BASED_OUTPATIENT_CLINIC_OR_DEPARTMENT_OTHER): Payer: BLUE CROSS/BLUE SHIELD

## 2014-10-01 VITALS — BP 153/75 | HR 62 | Temp 98.5°F | Resp 18

## 2014-10-01 DIAGNOSIS — D649 Anemia, unspecified: Secondary | ICD-10-CM

## 2014-10-01 DIAGNOSIS — C9000 Multiple myeloma not having achieved remission: Secondary | ICD-10-CM

## 2014-10-01 LAB — HOLD TUBE, BLOOD BANK

## 2014-10-01 MED ORDER — HEPARIN SOD (PORK) LOCK FLUSH 100 UNIT/ML IV SOLN
500.0000 [IU] | Freq: Every day | INTRAVENOUS | Status: DC | PRN
  Filled 2014-10-01: qty 5

## 2014-10-01 MED ORDER — SODIUM CHLORIDE 0.9 % IJ SOLN
3.0000 mL | INTRAMUSCULAR | Status: DC | PRN
  Filled 2014-10-01: qty 10

## 2014-10-01 MED ORDER — SODIUM CHLORIDE 0.9 % IJ SOLN
10.0000 mL | INTRAMUSCULAR | Status: DC | PRN
  Filled 2014-10-01: qty 10

## 2014-10-01 MED ORDER — SODIUM CHLORIDE 0.9 % IV SOLN
250.0000 mL | Freq: Once | INTRAVENOUS | Status: AC
  Administered 2014-10-01: 250 mL via INTRAVENOUS

## 2014-10-01 NOTE — Telephone Encounter (Signed)
Per 07/20 POF, pt recd 2 units today and again  per charge nurse pt doesn't need any for 07/22.Marland Kitchen... KJ

## 2014-10-01 NOTE — Telephone Encounter (Signed)
TC from patient inquiring about what time he needs to be here today, for transfusion of 2 units PRBC's. He still has his blue bracelet on. Spoke with charge nurse in infusion Lavella Lemons) -he can come in @ 1:15pm  this afternoon. Informed pt of time and he stated he would be here.

## 2014-10-01 NOTE — Patient Instructions (Addendum)
Blood Transfusion  A blood transfusion replaces your blood or some of its parts. Blood is replaced when you have lost blood because of surgery, an accident, or for severe blood conditions like anemia. You can donate blood to be used on yourself if you have a planned surgery. If you lose blood during that surgery, your own blood can be given back to you. Any blood given to you is checked to make sure it matches your blood type. Your temperature, blood pressure, and heart rate (vital signs) will be checked often.  GET HELP RIGHT AWAY IF:   You feel sick to your stomach (nauseous) or throw up (vomit).  You have watery poop (diarrhea).  You have shortness of breath or trouble breathing.  You have blood in your pee (urine) or have dark colored pee.  You have chest pain or tightness.  Your eyes or skin turn yellow (jaundice).  You have a temperature by mouth above 102 F (38.9 C), not controlled by medicine.  You start to shake and have chills.  You develop a a red rash (hives) or feel itchy.  You develop lightheadedness or feel confused.  You develop back, joint, or muscle pain.  You do not feel hungry (lost appetite).  You feel tired, restless, or nervous.  You develop belly (abdominal) cramps. Document Released: 05/27/2008 Document Revised: 05/23/2011 Document Reviewed: 05/27/2008 ExitCare Patient Information 2015 [employer]. This information is not intended to replace advice given to you by your health care provider. Make sure you discuss any questions you have with your health care provider. Blood Transfusion Information WHAT IS A BLOOD TRANSFUSION? A transfusion is the replacement of blood or some of its parts. Blood is made up of multiple cells which provide different functions.  Red blood cells carry oxygen and are used for blood loss replacement.  White blood cells fight against infection.  Platelets control bleeding.  Plasma helps clot blood.  Other blood  products are available for specialized needs, such as hemophilia or other clotting disorders. BEFORE THE TRANSFUSION  Who gives blood for transfusions?   You may be able to donate blood to be used at a later date on yourself (autologous donation).  Relatives can be asked to donate blood. This is generally not any safer than if you have received blood from a stranger. The same precautions are taken to ensure safety when a relative's blood is donated.  Healthy volunteers who are fully evaluated to make sure their blood is safe. This is blood bank blood. Transfusion therapy is the safest it has ever been in the practice of medicine. Before blood is taken from a donor, a complete history is taken to make sure that person has no history of diseases nor engages in risky social behavior (examples are intravenous drug use or sexual activity with multiple partners). The donor's travel history is screened to minimize risk of transmitting infections, such as malaria. The donated blood is tested for signs of infectious diseases, such as HIV and hepatitis. The blood is then tested to be sure it is compatible with you in order to minimize the chance of a transfusion reaction. If you or a relative donates blood, this is often done in anticipation of surgery and is not appropriate for emergency situations. It takes many days to process the donated blood. RISKS AND COMPLICATIONS Although transfusion therapy is very safe and saves many lives, the main dangers of transfusion include:   Getting an infectious disease.  Developing a transfusion reaction. This   is an allergic reaction to something in the blood you were given. Every precaution is taken to prevent this. The decision to have a blood transfusion has been considered carefully by your caregiver before blood is given. Blood is not given unless the benefits outweigh the risks. AFTER THE TRANSFUSION  Right after receiving a blood transfusion, you will usually  feel much better and more energetic. This is especially true if your red blood cells have gotten low (anemic). The transfusion raises the level of the red blood cells which carry oxygen, and this usually causes an energy increase.  The nurse administering the transfusion will monitor you carefully for complications. HOME CARE INSTRUCTIONS  No special instructions are needed after a transfusion. You may find your energy is better. Speak with your caregiver about any limitations on activity for underlying diseases you may have. SEEK MEDICAL CARE IF:   Your condition is not improving after your transfusion.  You develop redness or irritation at the intravenous (IV) site. SEEK IMMEDIATE MEDICAL CARE IF:  Any of the following symptoms occur over the next 12 hours:  Shaking chills.  You have a temperature by mouth above 102 F (38.9 C), not controlled by medicine.  Chest, back, or muscle pain.  People around you feel you are not acting correctly or are confused.  Shortness of breath or difficulty breathing.  Dizziness and fainting.  You get a rash or develop hives.  You have a decrease in urine output.  Your urine turns a dark color or changes to pink, red, or brown. Any of the following symptoms occur over the next 10 days:  You have a temperature by mouth above 102 F (38.9 C), not controlled by medicine.  Shortness of breath.  Weakness after normal activity.  The white part of the eye turns yellow (jaundice).  You have a decrease in the amount of urine or are urinating less often.  Your urine turns a dark color or changes to pink, red, or brown. Document Released: 02/26/2000 Document Revised: 05/23/2011 Document Reviewed: 10/15/2007 ExitCare Patient Information 2015 [employer]. This information is not intended to replace advice given to you by your health care provider. Make sure you discuss any questions you have with your health care provider.  

## 2014-10-02 LAB — TYPE AND SCREEN
ABO/RH(D): A POS
ANTIBODY SCREEN: NEGATIVE
Unit division: 0
Unit division: 0

## 2014-10-13 ENCOUNTER — Other Ambulatory Visit: Payer: Self-pay | Admitting: Internal Medicine

## 2014-10-15 LAB — AFB CULTURE WITH SMEAR (NOT AT ARMC): ACID FAST SMEAR: NONE SEEN

## 2014-10-16 ENCOUNTER — Telehealth: Payer: Self-pay | Admitting: *Deleted

## 2014-10-16 MED ORDER — ATORVASTATIN CALCIUM 80 MG PO TABS: 80.0000 mg | ORAL_TABLET | Freq: Every day | ORAL | Status: AC

## 2014-10-16 MED ORDER — ISOSORB DINITRATE-HYDRALAZINE 20-37.5 MG PO TABS: 0.5000 | ORAL_TABLET | Freq: Two times a day (BID) | ORAL | Status: AC

## 2014-10-16 NOTE — Telephone Encounter (Signed)
PRESCRIPTIONS E-SCRIBED TO PHARMACY.

## 2014-10-20 ENCOUNTER — Telehealth: Payer: Self-pay | Admitting: Oncology

## 2014-10-20 NOTE — Telephone Encounter (Signed)
Faxed pt medical records to [facility]

## 2014-12-16 ENCOUNTER — Ambulatory Visit: Payer: BLUE CROSS/BLUE SHIELD | Admitting: Oncology

## 2014-12-16 ENCOUNTER — Other Ambulatory Visit: Payer: Self-pay | Admitting: *Deleted

## 2014-12-16 ENCOUNTER — Telehealth: Payer: Self-pay | Admitting: Oncology

## 2014-12-16 NOTE — Telephone Encounter (Signed)
pt moved to [place] per pof

## 2015-01-04 ENCOUNTER — Other Ambulatory Visit: Payer: Self-pay | Admitting: Cardiovascular Disease

## 2015-01-05 ENCOUNTER — Other Ambulatory Visit: Payer: Self-pay

## 2015-01-05 MED ORDER — POTASSIUM CHLORIDE ER 10 MEQ PO TBCR: 10.0000 meq | EXTENDED_RELEASE_TABLET | Freq: Every day | ORAL | Status: AC

## 2015-02-01 ENCOUNTER — Other Ambulatory Visit: Payer: Self-pay | Admitting: Cardiovascular Disease

## 2015-02-02 NOTE — Telephone Encounter (Signed)
Medication Detail      Disp Refills Start End     carvedilol (COREG) 6.25 MG tablet 180 tablet 3 09/18/2014     Sig - Route: Take 1 tablet (6.25 mg total) by mouth 2 (two) times daily with a meal. - Oral    E-Prescribing Status: Receipt confirmed by pharmacy (09/18/2014 12:35 PM EDT)     Pharmacy    [id number] [place], [place]

## 2015-05-05 ENCOUNTER — Encounter: Payer: Self-pay | Admitting: Oncology

## 2015-05-05 NOTE — Progress Notes (Signed)
Faxed notes/labs to [phone]

## 2015-05-05 NOTE — Progress Notes (Signed)
Left forms in box 05/04/15

## 2015-07-31 ENCOUNTER — Encounter: Payer: Self-pay | Admitting: Oncology

## 2015-07-31 NOTE — Progress Notes (Signed)
Fax sent 05/05/15 - I sent to medical records

## 2015-08-22 IMAGING — CR DG BONE SURVEY MET
10 series · 10 of 10 positions shown · non-contrast
Comparison: None.

CLINICAL DATA: Multiple myeloma. Right anterior rib pain.

EXAM:
METASTATIC BONE SURVEY

[w chest pa *]
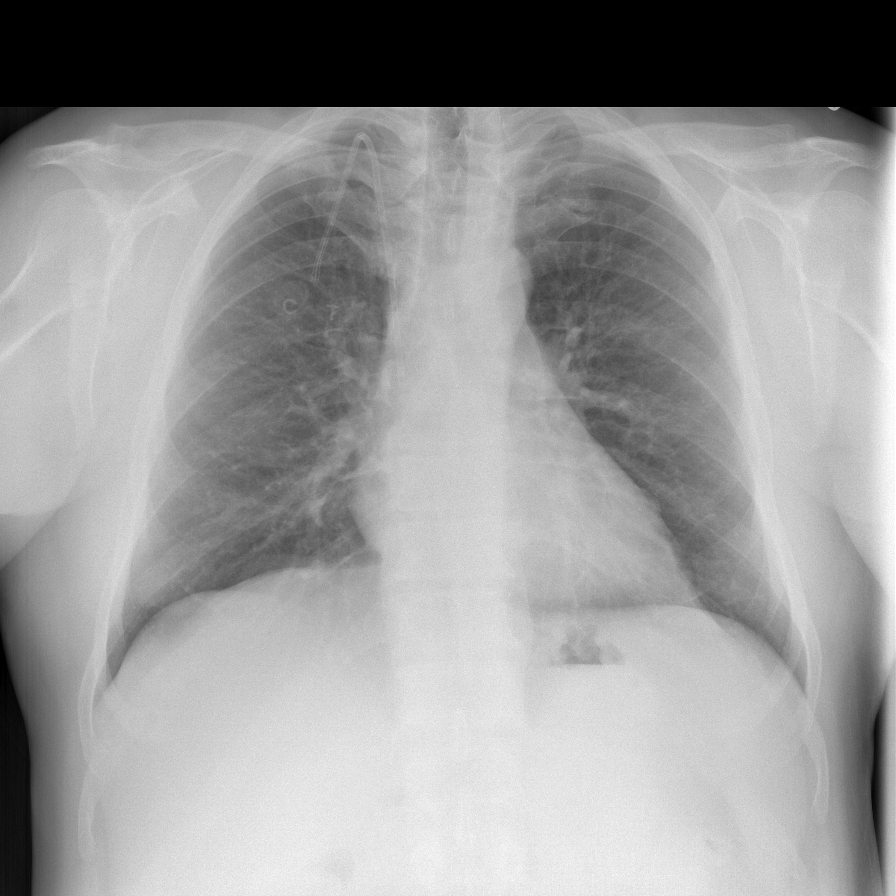

[w c-spine lat *]
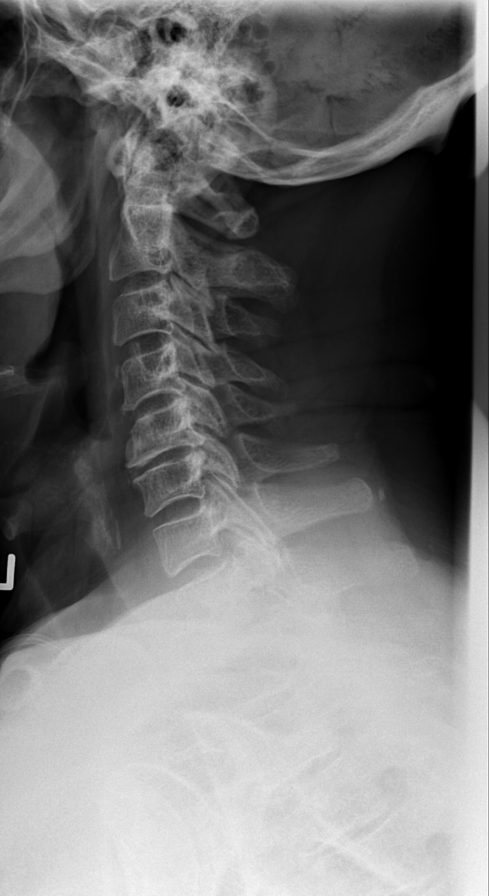

[w swimmers view *]
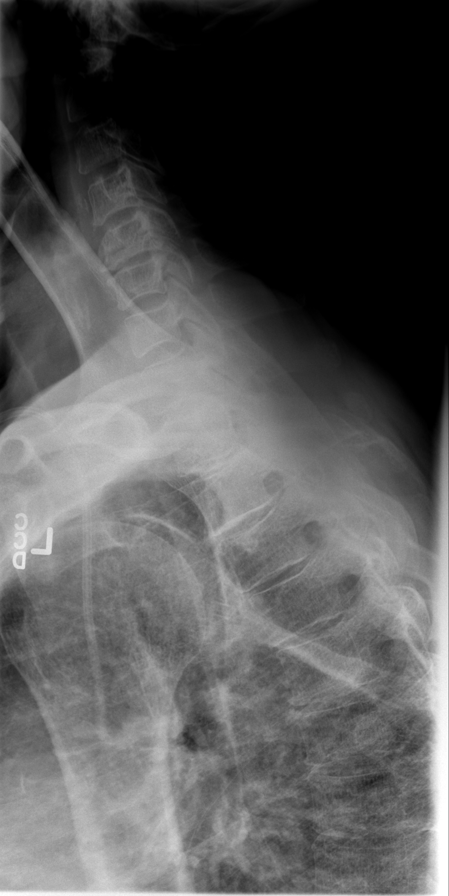

[w skull lat]
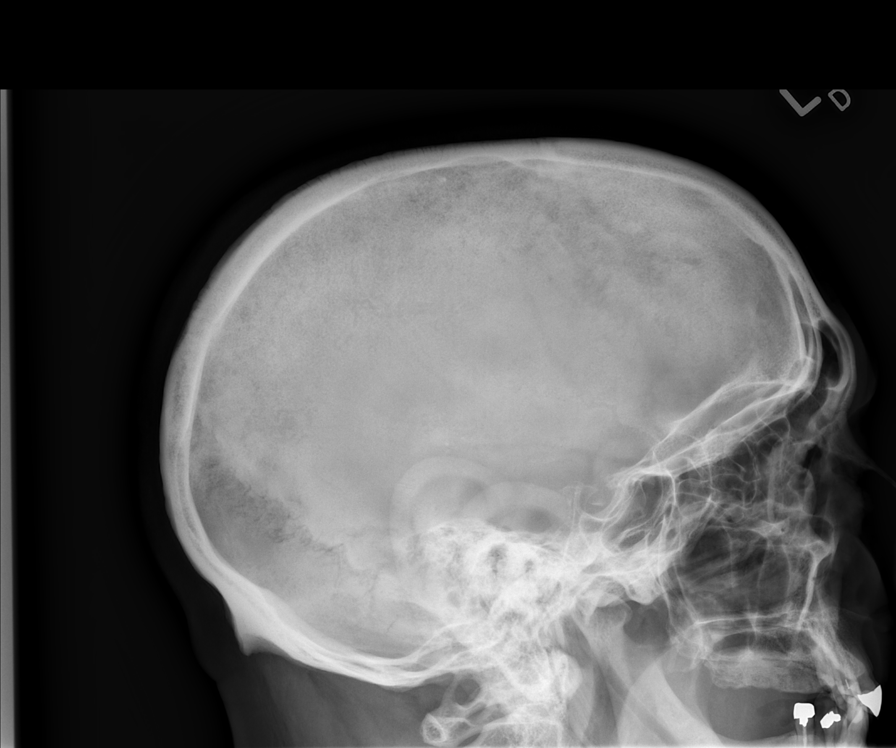

[t c-spine a.p.]
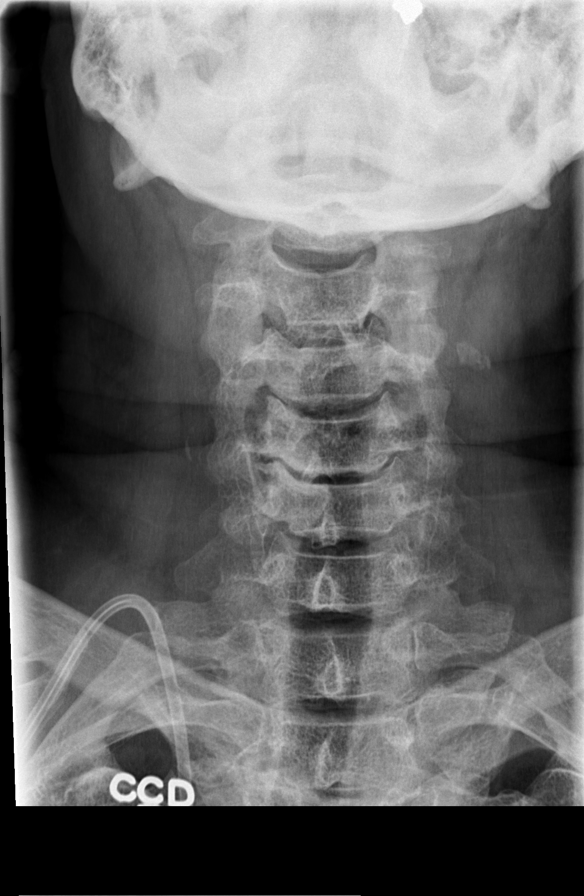

[t t-spine a.p.]
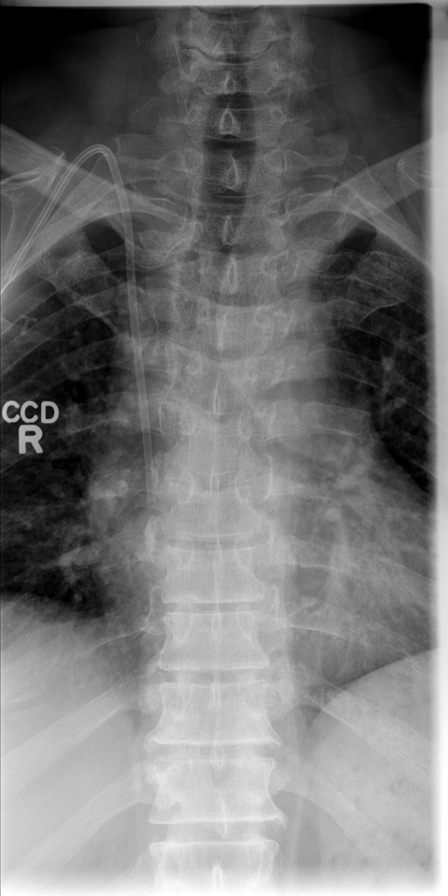

[t l-spine a.p.]
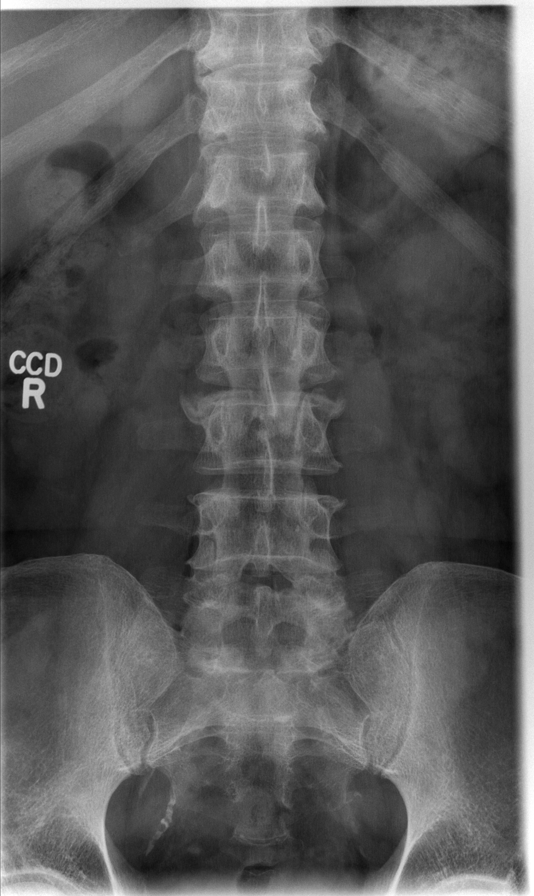

[t pelvis a.p.]
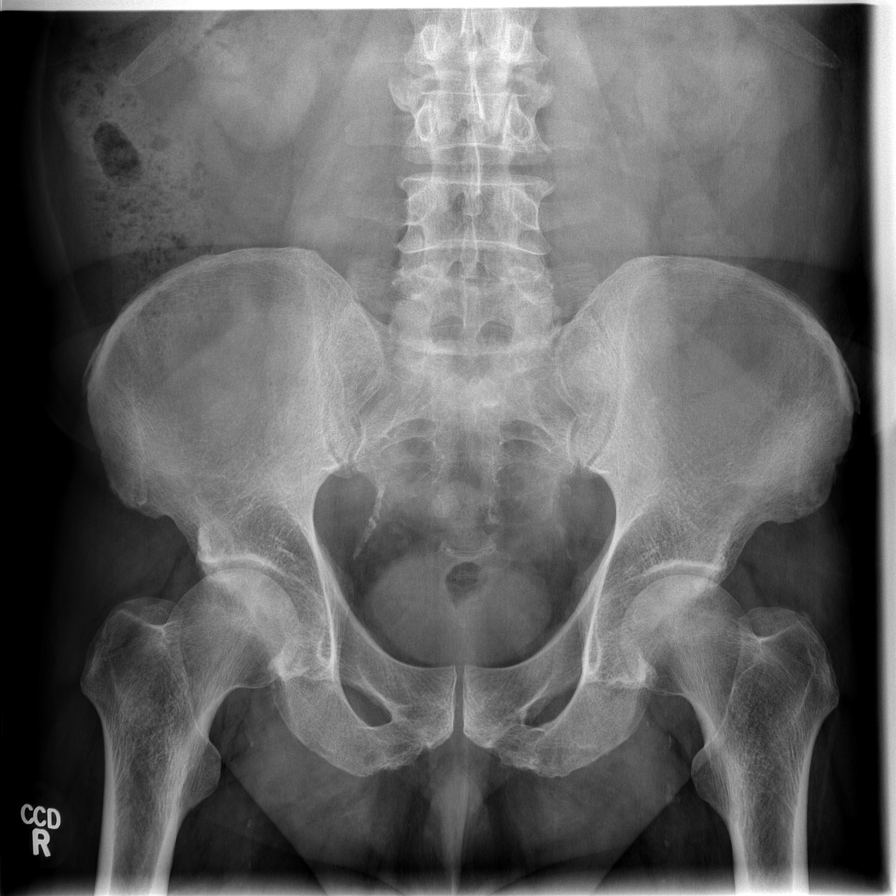

[t femur with hip  ap left]
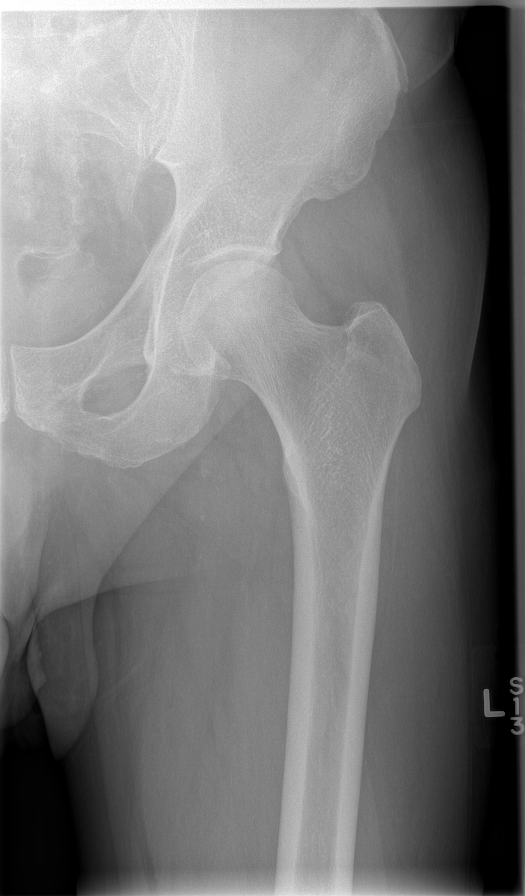

[t femur with knee ap left]
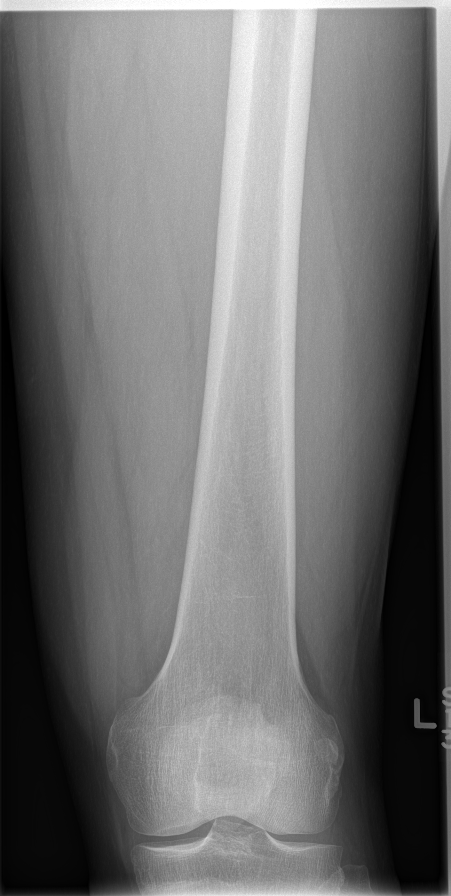

[10 of 10 positions shown; findings below may reference images not displayed]

FINDINGS: There are few small vague lucencies in the skull which are most
likely venous [place] and not lesions of myeloma. No prior studies
available for comparison. Vague lucencies in the inferior pubic rami
are not felt to be significant.

The remainder of the skeleton appears essentially normal except for
expected degenerative changes.
IMPRESSION: No visible multiple myeloma lesions in the skeleton.

## 2016-03-05 IMAGING — CR DG THORACIC SPINE 2V
3 series · 3 of 3 positions shown · non-contrast
Comparison: DG BONE SURVEY MET dated 12/27/2012

CLINICAL DATA: left low back, hip, groin pain; myeloma

EXAM:
THORACIC SPINE - 2 VIEW

[t t-spine a.p.]
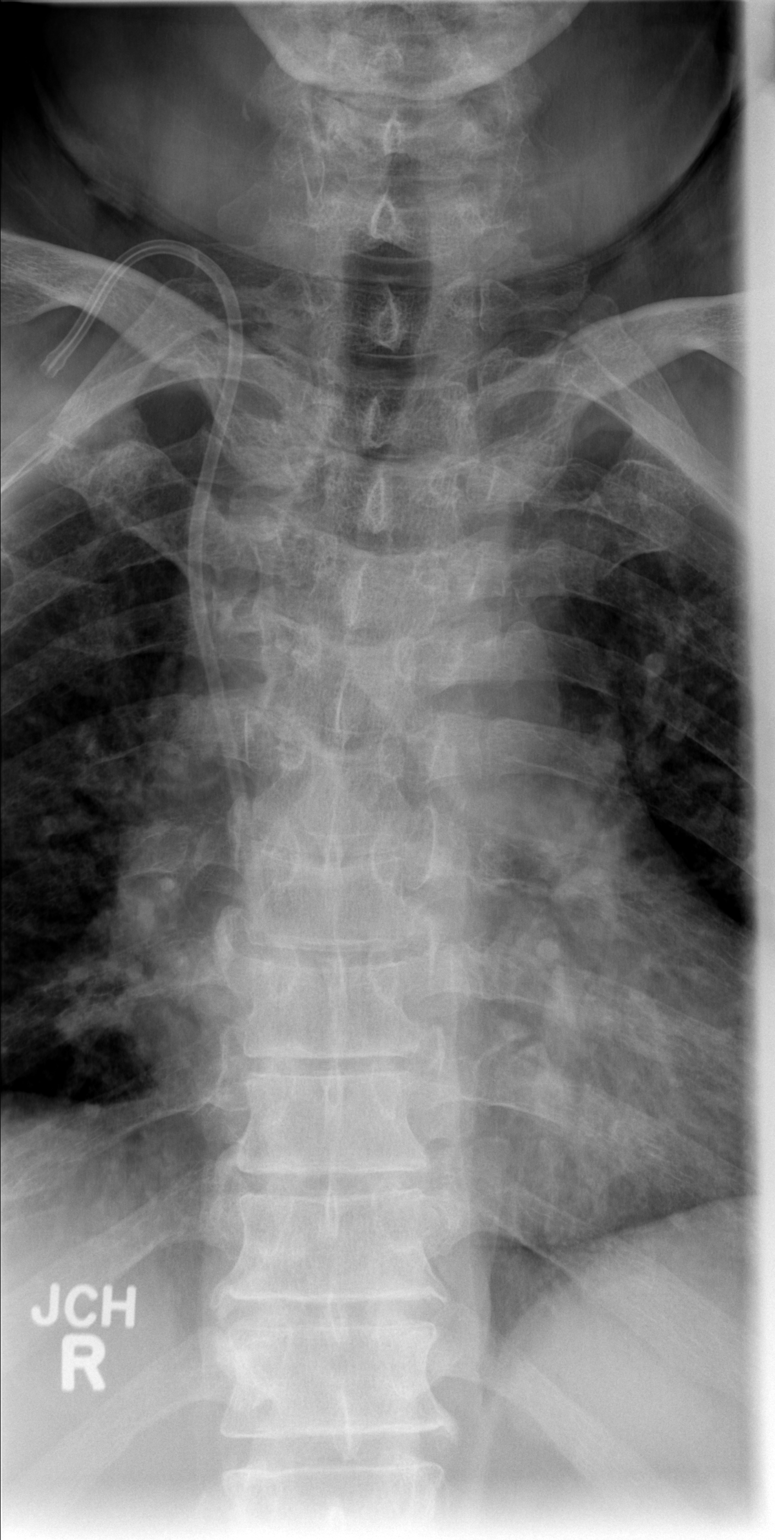

[t t-spine lat]
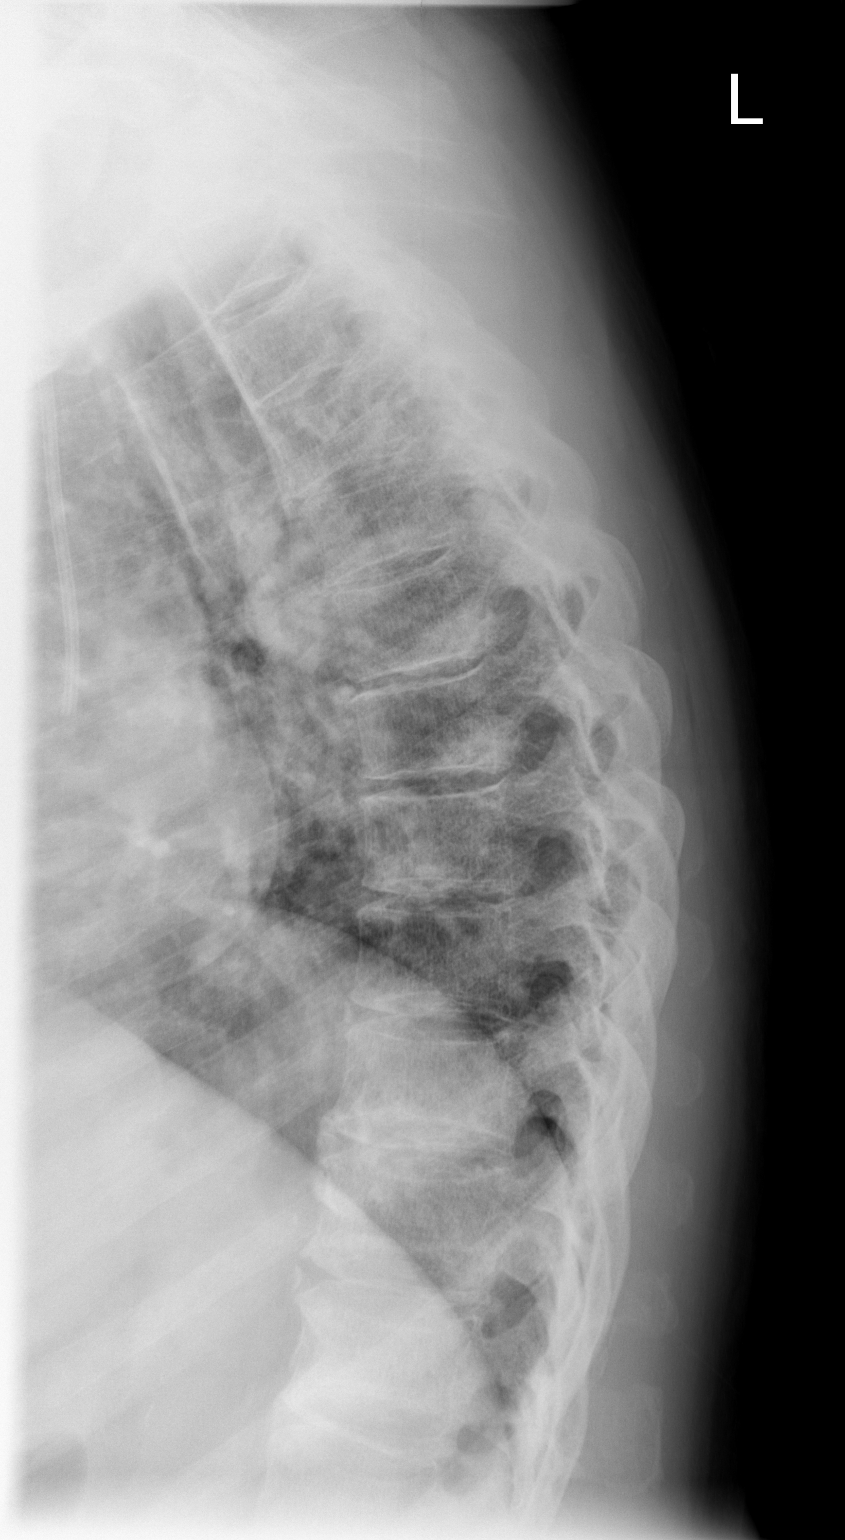

[t swimmers]
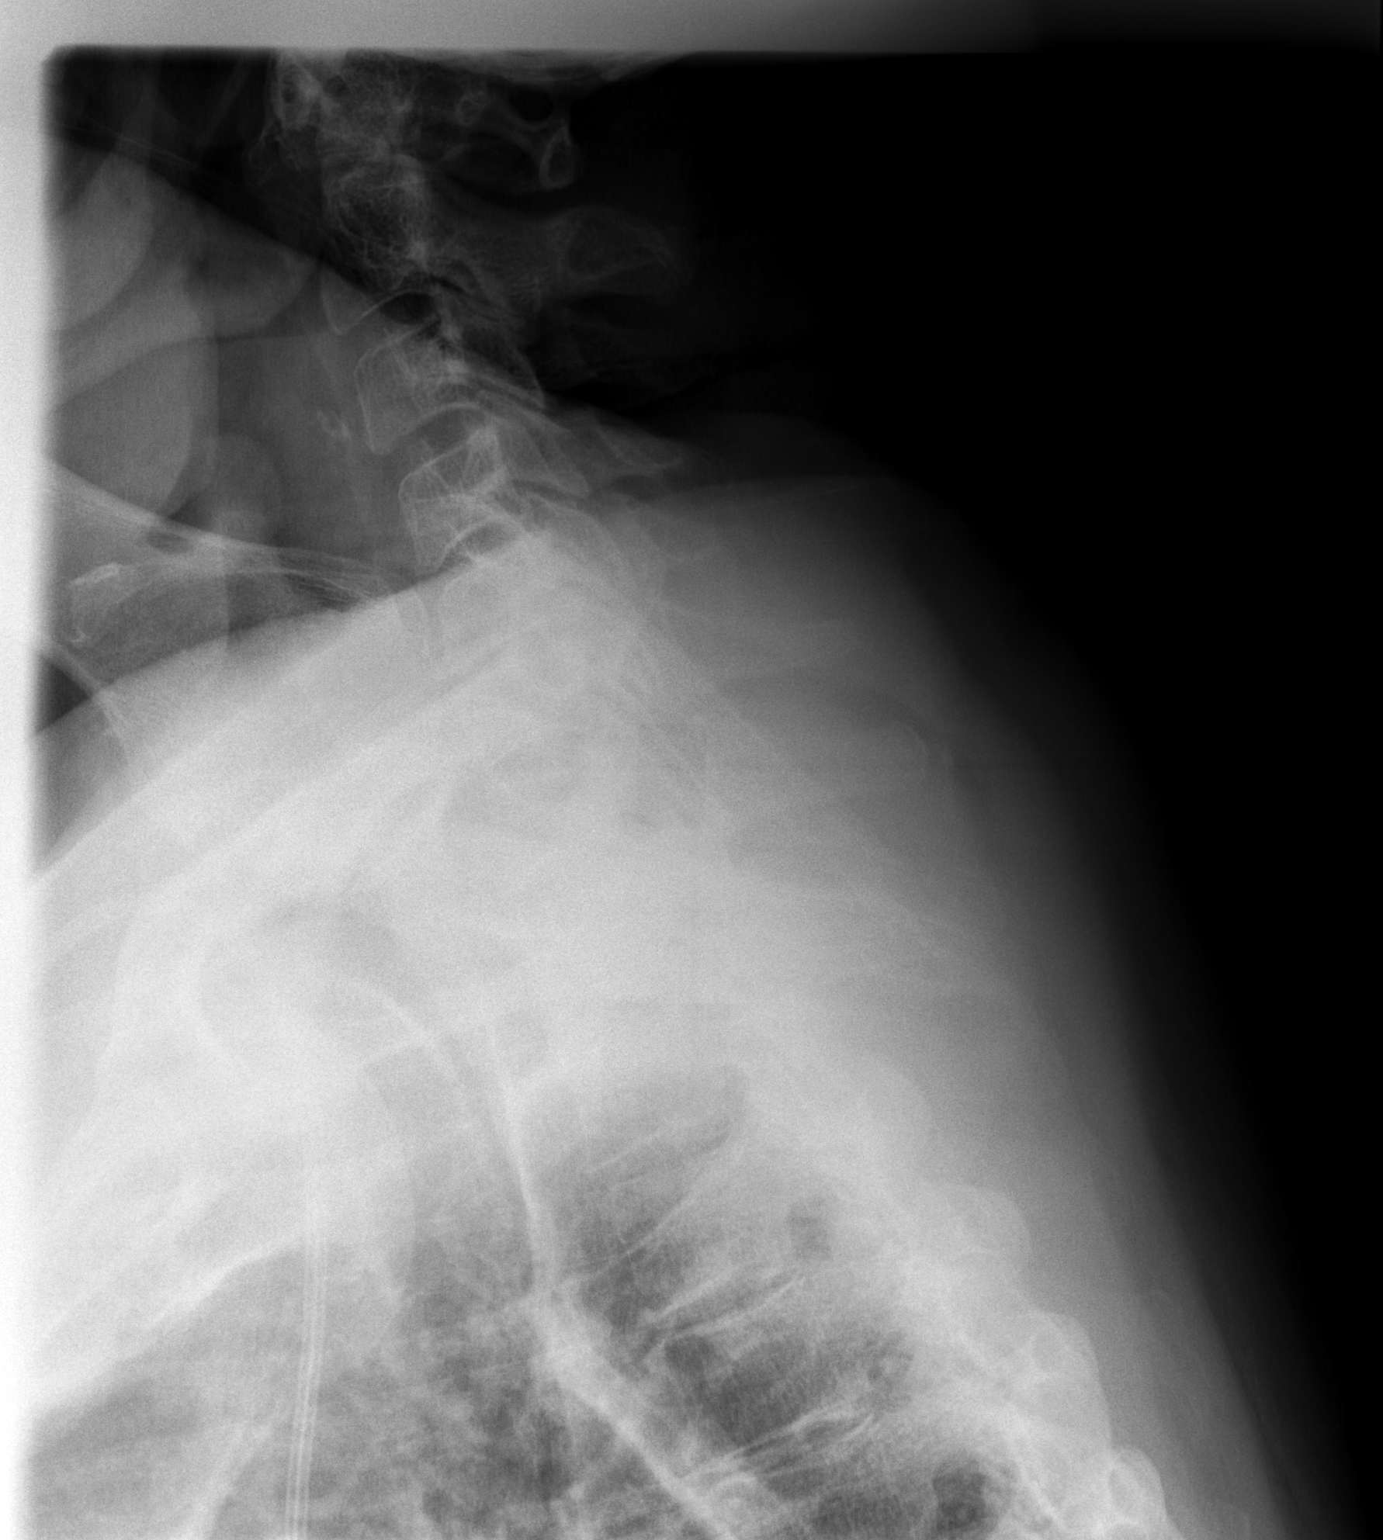

[3 of 3 positions shown; findings below may reference images not displayed]

FINDINGS: There is no evidence of thoracic spine fracture. Alignment is
normal. No other significant bone abnormalities are identified.
There is no evidence of lytic or blastic foci.
IMPRESSION: Negative.

## 2017-01-12 DEATH — deceased
